# Patient Record
Sex: Female | Born: 1935
Health system: Southern US, Community
[De-identification: ages and names within clinical notes are randomized; demographics above are authoritative.]

## PROBLEM LIST (undated history)

## (undated) DIAGNOSIS — I259 Chronic ischemic heart disease, unspecified: Secondary | ICD-10-CM

## (undated) DIAGNOSIS — E785 Hyperlipidemia, unspecified: Secondary | ICD-10-CM

## (undated) DIAGNOSIS — I359 Nonrheumatic aortic valve disorder, unspecified: Secondary | ICD-10-CM

## (undated) DIAGNOSIS — F329 Major depressive disorder, single episode, unspecified: Secondary | ICD-10-CM

## (undated) DIAGNOSIS — M545 Low back pain, unspecified: Secondary | ICD-10-CM

## (undated) DIAGNOSIS — F32A Depression, unspecified: Secondary | ICD-10-CM

## (undated) DIAGNOSIS — J45909 Unspecified asthma, uncomplicated: Secondary | ICD-10-CM

## (undated) DIAGNOSIS — R011 Cardiac murmur, unspecified: Secondary | ICD-10-CM

## (undated) DIAGNOSIS — E079 Disorder of thyroid, unspecified: Secondary | ICD-10-CM

## (undated) DIAGNOSIS — E039 Hypothyroidism, unspecified: Secondary | ICD-10-CM

## (undated) DIAGNOSIS — R0602 Shortness of breath: Secondary | ICD-10-CM

## (undated) DIAGNOSIS — I10 Essential (primary) hypertension: Secondary | ICD-10-CM

## (undated) DIAGNOSIS — M1990 Unspecified osteoarthritis, unspecified site: Secondary | ICD-10-CM

## (undated) HISTORY — PX: BREAST SURGERY: SHX581

## (undated) HISTORY — DX: Chronic ischemic heart disease, unspecified: I25.9

## (undated) HISTORY — DX: Low back pain: M54.5

## (undated) HISTORY — DX: Nonrheumatic aortic valve disorder, unspecified: I35.9

## (undated) HISTORY — DX: Low back pain, unspecified: M54.50

## (undated) HISTORY — DX: Hyperlipidemia, unspecified: E78.5

## (undated) HISTORY — DX: Disorder of thyroid, unspecified: E07.9

## (undated) HISTORY — DX: Essential (primary) hypertension: I10

## (undated) HISTORY — PX: TUBAL LIGATION: SHX77

---

## 1997-08-10 ENCOUNTER — Ambulatory Visit (HOSPITAL_COMMUNITY): Admission: RE | Admit: 1997-08-10 | Discharge: 1997-08-10 | Payer: Self-pay | Admitting: Internal Medicine

## 1997-08-13 ENCOUNTER — Ambulatory Visit: Admission: RE | Admit: 1997-08-13 | Discharge: 1997-08-13 | Payer: Self-pay | Admitting: Internal Medicine

## 1997-11-16 ENCOUNTER — Ambulatory Visit (HOSPITAL_COMMUNITY): Admission: RE | Admit: 1997-11-16 | Discharge: 1997-11-16 | Payer: Self-pay | Admitting: General Surgery

## 1998-02-26 ENCOUNTER — Encounter: Payer: Self-pay | Admitting: Internal Medicine

## 1998-02-26 ENCOUNTER — Ambulatory Visit (HOSPITAL_COMMUNITY): Admission: RE | Admit: 1998-02-26 | Discharge: 1998-02-26 | Payer: Self-pay | Admitting: Internal Medicine

## 1998-05-13 ENCOUNTER — Ambulatory Visit (HOSPITAL_COMMUNITY): Admission: RE | Admit: 1998-05-13 | Discharge: 1998-05-13 | Payer: Self-pay | Admitting: General Surgery

## 1998-11-09 ENCOUNTER — Ambulatory Visit (HOSPITAL_COMMUNITY): Admission: RE | Admit: 1998-11-09 | Discharge: 1998-11-09 | Payer: Self-pay | Admitting: General Surgery

## 1998-11-09 ENCOUNTER — Encounter (HOSPITAL_BASED_OUTPATIENT_CLINIC_OR_DEPARTMENT_OTHER): Payer: Self-pay | Admitting: General Surgery

## 1998-11-10 ENCOUNTER — Encounter (INDEPENDENT_AMBULATORY_CARE_PROVIDER_SITE_OTHER): Payer: Self-pay

## 1998-11-10 ENCOUNTER — Ambulatory Visit (HOSPITAL_COMMUNITY): Admission: RE | Admit: 1998-11-10 | Discharge: 1998-11-10 | Payer: Self-pay | Admitting: General Surgery

## 1998-11-10 ENCOUNTER — Encounter (HOSPITAL_BASED_OUTPATIENT_CLINIC_OR_DEPARTMENT_OTHER): Payer: Self-pay | Admitting: General Surgery

## 1999-02-14 ENCOUNTER — Encounter: Admission: RE | Admit: 1999-02-14 | Discharge: 1999-02-14 | Payer: Self-pay | Admitting: Internal Medicine

## 1999-03-26 ENCOUNTER — Emergency Department (HOSPITAL_COMMUNITY): Admission: EM | Admit: 1999-03-26 | Discharge: 1999-03-26 | Payer: Self-pay | Admitting: Emergency Medicine

## 1999-03-26 ENCOUNTER — Encounter: Payer: Self-pay | Admitting: Emergency Medicine

## 1999-05-30 ENCOUNTER — Encounter (HOSPITAL_BASED_OUTPATIENT_CLINIC_OR_DEPARTMENT_OTHER): Payer: Self-pay | Admitting: General Surgery

## 1999-05-30 ENCOUNTER — Ambulatory Visit (HOSPITAL_COMMUNITY): Admission: RE | Admit: 1999-05-30 | Discharge: 1999-05-30 | Payer: Self-pay | Admitting: General Surgery

## 1999-11-15 ENCOUNTER — Ambulatory Visit (HOSPITAL_COMMUNITY): Admission: RE | Admit: 1999-11-15 | Discharge: 1999-11-15 | Payer: Self-pay | Admitting: General Surgery

## 1999-11-15 ENCOUNTER — Encounter (HOSPITAL_BASED_OUTPATIENT_CLINIC_OR_DEPARTMENT_OTHER): Payer: Self-pay | Admitting: General Surgery

## 2000-01-19 ENCOUNTER — Ambulatory Visit (HOSPITAL_COMMUNITY): Admission: RE | Admit: 2000-01-19 | Discharge: 2000-01-19 | Payer: Self-pay | Admitting: Gastroenterology

## 2000-11-21 ENCOUNTER — Emergency Department (HOSPITAL_COMMUNITY): Admission: EM | Admit: 2000-11-21 | Discharge: 2000-11-21 | Payer: Self-pay | Admitting: *Deleted

## 2000-11-21 ENCOUNTER — Encounter: Payer: Self-pay | Admitting: Emergency Medicine

## 2000-12-06 ENCOUNTER — Ambulatory Visit (HOSPITAL_COMMUNITY): Admission: RE | Admit: 2000-12-06 | Discharge: 2000-12-06 | Payer: Self-pay | Admitting: General Surgery

## 2000-12-06 ENCOUNTER — Encounter (HOSPITAL_BASED_OUTPATIENT_CLINIC_OR_DEPARTMENT_OTHER): Payer: Self-pay | Admitting: General Surgery

## 2001-06-02 ENCOUNTER — Encounter: Payer: Self-pay | Admitting: *Deleted

## 2001-06-02 ENCOUNTER — Emergency Department (HOSPITAL_COMMUNITY): Admission: EM | Admit: 2001-06-02 | Discharge: 2001-06-02 | Payer: Self-pay | Admitting: Emergency Medicine

## 2001-12-13 ENCOUNTER — Ambulatory Visit (HOSPITAL_COMMUNITY): Admission: RE | Admit: 2001-12-13 | Discharge: 2001-12-13 | Payer: Self-pay | Admitting: General Surgery

## 2001-12-13 ENCOUNTER — Encounter (HOSPITAL_BASED_OUTPATIENT_CLINIC_OR_DEPARTMENT_OTHER): Payer: Self-pay | Admitting: General Surgery

## 2001-12-20 ENCOUNTER — Encounter: Admission: RE | Admit: 2001-12-20 | Discharge: 2001-12-20 | Payer: Self-pay | Admitting: General Surgery

## 2001-12-20 ENCOUNTER — Encounter (HOSPITAL_BASED_OUTPATIENT_CLINIC_OR_DEPARTMENT_OTHER): Payer: Self-pay | Admitting: General Surgery

## 2002-07-11 ENCOUNTER — Inpatient Hospital Stay (HOSPITAL_COMMUNITY): Admission: RE | Admit: 2002-07-11 | Discharge: 2002-07-16 | Payer: Self-pay | Admitting: Internal Medicine

## 2002-07-11 ENCOUNTER — Encounter: Payer: Self-pay | Admitting: Internal Medicine

## 2002-07-16 ENCOUNTER — Encounter (INDEPENDENT_AMBULATORY_CARE_PROVIDER_SITE_OTHER): Payer: Self-pay | Admitting: Cardiology

## 2003-01-22 ENCOUNTER — Encounter: Admission: RE | Admit: 2003-01-22 | Discharge: 2003-01-22 | Payer: Self-pay | Admitting: Internal Medicine

## 2003-01-22 ENCOUNTER — Encounter: Payer: Self-pay | Admitting: Internal Medicine

## 2003-02-03 ENCOUNTER — Encounter: Admission: RE | Admit: 2003-02-03 | Discharge: 2003-04-01 | Payer: Self-pay | Admitting: Internal Medicine

## 2003-06-04 ENCOUNTER — Encounter: Admission: RE | Admit: 2003-06-04 | Discharge: 2003-09-02 | Payer: Self-pay | Admitting: Internal Medicine

## 2003-09-02 ENCOUNTER — Emergency Department (HOSPITAL_COMMUNITY): Admission: EM | Admit: 2003-09-02 | Discharge: 2003-09-02 | Payer: Self-pay | Admitting: Family Medicine

## 2003-09-18 ENCOUNTER — Inpatient Hospital Stay (HOSPITAL_COMMUNITY): Admission: AD | Admit: 2003-09-18 | Discharge: 2003-09-23 | Payer: Self-pay | Admitting: Internal Medicine

## 2003-11-10 ENCOUNTER — Ambulatory Visit (HOSPITAL_COMMUNITY): Admission: RE | Admit: 2003-11-10 | Discharge: 2003-11-10 | Payer: Self-pay | Admitting: Internal Medicine

## 2003-11-10 ENCOUNTER — Encounter (INDEPENDENT_AMBULATORY_CARE_PROVIDER_SITE_OTHER): Payer: Self-pay | Admitting: Cardiovascular Disease

## 2004-01-27 ENCOUNTER — Encounter: Admission: RE | Admit: 2004-01-27 | Discharge: 2004-01-27 | Payer: Self-pay | Admitting: General Surgery

## 2004-03-08 ENCOUNTER — Ambulatory Visit (HOSPITAL_COMMUNITY): Admission: RE | Admit: 2004-03-08 | Discharge: 2004-03-08 | Payer: Self-pay | Admitting: Internal Medicine

## 2004-07-08 ENCOUNTER — Inpatient Hospital Stay (HOSPITAL_COMMUNITY): Admission: EM | Admit: 2004-07-08 | Discharge: 2004-07-12 | Payer: Self-pay | Admitting: Family Medicine

## 2004-07-18 ENCOUNTER — Emergency Department (HOSPITAL_COMMUNITY): Admission: EM | Admit: 2004-07-18 | Discharge: 2004-07-18 | Payer: Self-pay | Admitting: Family Medicine

## 2005-02-06 ENCOUNTER — Encounter: Admission: RE | Admit: 2005-02-06 | Discharge: 2005-02-06 | Payer: Self-pay | Admitting: Internal Medicine

## 2005-10-25 ENCOUNTER — Emergency Department (HOSPITAL_COMMUNITY): Admission: EM | Admit: 2005-10-25 | Discharge: 2005-10-25 | Payer: Self-pay | Admitting: Family Medicine

## 2006-02-14 ENCOUNTER — Encounter: Admission: RE | Admit: 2006-02-14 | Discharge: 2006-02-14 | Payer: Self-pay

## 2007-01-16 ENCOUNTER — Emergency Department (HOSPITAL_COMMUNITY): Admission: EM | Admit: 2007-01-16 | Discharge: 2007-01-16 | Payer: Self-pay | Admitting: Emergency Medicine

## 2007-03-15 ENCOUNTER — Encounter: Admission: RE | Admit: 2007-03-15 | Discharge: 2007-03-15 | Payer: Self-pay | Admitting: Internal Medicine

## 2007-08-26 ENCOUNTER — Emergency Department (HOSPITAL_COMMUNITY): Admission: EM | Admit: 2007-08-26 | Discharge: 2007-08-26 | Payer: Self-pay | Admitting: Emergency Medicine

## 2007-10-14 ENCOUNTER — Ambulatory Visit: Payer: Self-pay | Admitting: Vascular Surgery

## 2008-03-16 ENCOUNTER — Encounter: Admission: RE | Admit: 2008-03-16 | Discharge: 2008-03-16 | Payer: Self-pay | Admitting: Internal Medicine

## 2008-05-11 ENCOUNTER — Emergency Department (HOSPITAL_COMMUNITY): Admission: EM | Admit: 2008-05-11 | Discharge: 2008-05-11 | Payer: Self-pay | Admitting: Emergency Medicine

## 2008-05-24 ENCOUNTER — Emergency Department (HOSPITAL_COMMUNITY): Admission: EM | Admit: 2008-05-24 | Discharge: 2008-05-24 | Payer: Self-pay | Admitting: Family Medicine

## 2008-08-28 ENCOUNTER — Emergency Department (HOSPITAL_COMMUNITY): Admission: EM | Admit: 2008-08-28 | Discharge: 2008-08-28 | Payer: Self-pay | Admitting: Emergency Medicine

## 2009-03-17 ENCOUNTER — Encounter: Admission: RE | Admit: 2009-03-17 | Discharge: 2009-03-17 | Payer: Self-pay | Admitting: Internal Medicine

## 2009-09-19 ENCOUNTER — Emergency Department (HOSPITAL_COMMUNITY): Admission: EM | Admit: 2009-09-19 | Discharge: 2009-09-19 | Payer: Self-pay | Admitting: Family Medicine

## 2010-02-24 ENCOUNTER — Ambulatory Visit: Payer: Self-pay | Admitting: Cardiovascular Disease

## 2010-03-15 ENCOUNTER — Ambulatory Visit: Payer: Self-pay

## 2010-03-15 ENCOUNTER — Encounter: Payer: Self-pay | Admitting: Cardiovascular Disease

## 2010-03-15 ENCOUNTER — Ambulatory Visit (HOSPITAL_COMMUNITY): Admission: RE | Admit: 2010-03-15 | Discharge: 2010-03-15 | Payer: Self-pay | Admitting: Cardiovascular Disease

## 2010-03-15 ENCOUNTER — Ambulatory Visit: Payer: Self-pay | Admitting: Cardiovascular Disease

## 2010-03-30 ENCOUNTER — Encounter: Admission: RE | Admit: 2010-03-30 | Discharge: 2010-03-30 | Payer: Self-pay | Admitting: Internal Medicine

## 2010-07-19 ENCOUNTER — Ambulatory Visit
Admission: RE | Admit: 2010-07-19 | Discharge: 2010-07-19 | Disposition: A | Discharge status: Home / Self Care | Payer: PRIVATE HEALTH INSURANCE | Source: Ambulatory Visit | Attending: Internal Medicine | Admitting: Internal Medicine

## 2010-07-19 ENCOUNTER — Ambulatory Visit
Admission: RE | Admit: 2010-07-19 | Discharge: 2010-07-19 | Disposition: A | Discharge status: Home / Self Care | Payer: Medicare Other | Source: Ambulatory Visit | Attending: Internal Medicine | Admitting: Internal Medicine

## 2010-07-19 ENCOUNTER — Other Ambulatory Visit: Payer: Self-pay | Admitting: Internal Medicine

## 2010-07-19 DIAGNOSIS — M545 Low back pain: Secondary | ICD-10-CM

## 2010-07-19 DIAGNOSIS — M25559 Pain in unspecified hip: Secondary | ICD-10-CM

## 2010-08-10 LAB — GLUCOSE, CAPILLARY: Glucose-Capillary: 129 mg/dL — ABNORMAL HIGH (ref 70–99)

## 2010-08-11 ENCOUNTER — Encounter: Payer: Self-pay | Admitting: Internal Medicine

## 2010-09-13 NOTE — Procedures (Signed)
CAROTID DUPLEX EXAM   INDICATION:  Left carotid bruit.   HISTORY:  Diabetes:  Insulin-dependent.  Cardiac:  History of cardiac murmur.  Hypertension:  Yes.  Smoking:  No.  Previous Surgery:  No.  CV History:  Previous duplex of the carotids performed on 03/16/06  revealed 20-39% ICA stenosis bilaterally.  Patient reports frequent  dizzy spells.  Amaurosis Fugax No, Paresthesias No, Hemiparesis No                                       RIGHT             LEFT  Brachial systolic pressure:         168               168  Brachial Doppler waveforms:         Triphasic         Triphasic  Vertebral direction of flow:        Antegrade         Antegrade  DUPLEX VELOCITIES (cm/sec)  CCA peak systolic                   76                91  ECA peak systolic                   48                79  ICA peak systolic                   64                77  ICA end diastolic                   17                15  PLAQUE MORPHOLOGY:                  Soft              Mixed  PLAQUE AMOUNT:                      Mild              Mild  PLAQUE LOCATION:                    Proximal ICA      Proximal ICA   IMPRESSION:  1. 20-39% internal carotid artery stenosis bilaterally.  2. No significant change from previous study performed on 03/16/06.   ___________________________________________  Janetta Hora. Fields, MD   MC/MEDQ  D:  10/14/2007  T:  10/14/2007  Job:  [id number]

## 2010-09-16 NOTE — Discharge Summary (Signed)
NAME:  [name], [name] NO.:  [id number]   MEDICAL RECORD NO.:  [id number]                   PATIENT TYPE:  INP   LOCATION:  5157                                 FACILITY:  [facility]   PHYSICIAN:  Eric L. August Saucer, M.D.                  DATE OF BIRTH:  1932/07/27   DATE OF ADMISSION:  07/11/2002  DATE OF DISCHARGE:  07/16/2002                                 DISCHARGE SUMMARY   FINAL DIAGNOSES:  1. Unsteady gait with refractory vertigo, improved.  2. Benign positional vertigo.  3. Otitis media.  4. Diabetes mellitus, uncontrolled.  5. Depression with unresolved grief.  6. Hypertension.   OPERATION/PROCEDURE:  None.   HISTORY AND PHYSICAL:  The patient is a 77 year old widowed black female who  presents complaining of increasing dizziness with unsteady gait with  increasing left sided headaches of three days duration. She has a history of  allergic rhinitis, middle ear trauma in the past. She reports present  symptoms as being much different from her previous problems. They have not  been controlled with oral medications. There has been no documented fever  although she has had a flushed feeling for the past three days. She has  noted occasional cough. No wheeze. She felt dizziness with turning over in  bed, proceeded by sharp left sided headaches. The patient became increasing  concerned. There was no response to oral medications, and she was  subsequently admitted.   Her history is positive for hypertension, diabetes mellitus, obesity,  anxiety disorder. Recent depression with unresolved grief. Recent allergy  problems as well.   Past medical history and physical exam is per admission H&P.   HOSPITAL COURSE:  The patient was admitted for further treatment of severe  refractory vertigo with chronic Eustachian tube dysfunction. There was a  question by exam of underlying otitis media as well and associated  sinusitis. The patient was admitted and  placed at bedrest with bathroom  privileges with assistance. She was started on IV fluids for rehydration as  well. She had previously been on meclizine and was started on Valium at 2 mg  p.o. q.6h. She was placed on Avelox at 400 mg IV q.d. as well as  continuation of her Allegra. The patient also was noted to have mild  uncontrolled diabetes at the time and was placed on a sliding scale as well.  She was restarted on a diabetic medication, also medication for thyroid. It  was noted that she had significant problems with depression as well.  Consultation was obtained with Dr. Dub Mikes. She underwent further adjustments  of her psychotropic medicines for unresolved grief with major depression.   A CT scan of her head was done which showed no evidence of cranial lesions.  With intensive therapy, the patient did make steady improvement with gradual  resolution of her vertigo. Her gait improved thereafter as well. With  adjustment of her diabetes medications, her blood sugars improved. Ongoing  therapy with Dr. Dub Mikes was pursued as well. The patient did undergo an EEG  which showed no evidence for seizure disorder. It was felt that further  evaluation of vertigo and unsteady gait could be pursued as an outpatient.  After several days of therapy, the patient did progress to the point of  being stable for discharge. It was felt that she did have a component of  benign positional vertigo as well.   Notably with the change of her psychotropic medications, her depression did  improve as well, but further treatment will be needed as an outpatient.   By 07/16/02, she was feeling much better. She was felt to be stable for  discharge.   DISCHARGE MEDICATIONS:  Medications at the time of discharge consisted of:  1. Allegra 180 mg p.o. q.d.  2. Synthroid 100 mcg q.d.  3. Avandamet 2/1,000 mg b.i.d.  4. Lotrel 5/20 one q.d.  5. Astelin nasal spray two puffs b.i.d.  6. Antivert 25 mg t.i.d.  7. Effexor  XR 37.5 mg q.d.  8. Avelox 400 mg q.d. for seven days.  9. Lantus 12 units subcu q.h.s.   DIET:  She will be maintained on a 4-g sodium 1,500 calorie carbohydrate  modified diet on a sliding scale, with CBGs greater than or up to 350 15  units, 280 to 349 12 units, 210 to 279 9 units, 140 to 209 6 units.   FOLLOW UP:  She will call the office for followup appointment in two weeks  time.                                               Eric L. August Saucer, M.D.    ELD/MEDQ  D:  08/21/2002  T:  08/21/2002  Job:  [id number]

## 2010-09-16 NOTE — Discharge Summary (Signed)
NAME:  [name], [name]                          ACCOUNT NO.:  [id number]   MEDICAL RECORD NO.:  [id number]                   PATIENT TYPE:  INP   LOCATION:  5504                                 FACILITY:  [facility]   PHYSICIAN:  Eric L. August Saucer, M.D.                  DATE OF BIRTH:  August 28, 1932   DATE OF ADMISSION:  09/18/2003  DATE OF DISCHARGE:  09/23/2003                                 DISCHARGE SUMMARY   FINAL DIAGNOSES:  1. Diabetes mellitus, type 2, non-insulin-dependent, 250.82.  2. Obesity, 278.00.  3. Anemia, 285.9.  4. Panic disorder, 300.01.  5. Hypothyroidism, 244.9.  6. Hypertension, 401.9.   OPERATIONS/PROCEDURES:  None.   HISTORY OF PRESENT ILLNESS:  This was one of several [facility]  admissions for this 77 year old, widowed, black female with a longstanding  history of hypertension and diabetes.  The patient had been doing fairly  well until approximately 3 weeks ago.  During that time, she had begun  having episodes of low blood sugars followed by high blood sugars.  The  patient notably during that time had started changing her eating habits as  well as exercising.  Her medications, including Lantus insulin, were stopped  initially at that time.  She apparently had continued to have bouts of blood  sugars as low as 40s.  On further questioning, she apparently had been  taking extra insulin after drinking sodas for low blood sugars.  She noted  that her sugars would dip after rebound into the 200s and 300 range.  She  thereafter would take more insulin to bring her sugars down.  She was  checking her blood sugars as much as 6 times within a several hour period to  document her blood sugars.  The patient notably after hypoglycemic spells  would be mildly confused.  She had developed increasing anxiety around this  as well.  The patient essentially lives alone.  After calling our office in  panic, the patient was subsequently admitted for further evaluation and  stabilization of her blood sugars.   PAST MEDICAL HISTORY/PHYSICAL EXAMINATION:  Per admission H&P.   HOSPITAL COURSE:  The patient was admitted for further evaluation of her  uncontrolled diabetes mellitus.  By history, she was having marked,  fluctuating blood sugars.  There was concern that she was having a Somogyi  reaction.  This would result in low blood sugars during the night with  reactive hyperglycemia in the mornings.  The patient's medications were  adjusted.  Close followup of her blood sugars ensued thereafter, with a new  sliding scale being applied, though initially we were wanting to avoid any  further hypoglycemic spells.  She was also started on Actos with increasing  of her Metformin dosing.  Over the subsequent days, she had no further  hypoglycemic spells.  The patient became less confused.  She continued to  have some  anxiety which was adjusted with the medication.  On serial exams,  she was noted to have a mild otitis media.  This was treated with  antibiotics.  No other source of an infection was found.  The patient did  undergo further diabetic education.  Her regimen was eventually stabilized  at the time of discharge.   It was also noted that patient had significant anxiety disorder as noted.  She also had evidence for ongoing depression.  She was therefore placed on  Paxil for control of this as well.   By Sep 23, 2003, she was feeling considerably better.  It was felt that she  was stable for discharge, with close followup as an outpatient.  It was felt  that she would need further adjustments of her medication as her lifestyles  did change over the upcoming weeks.   MEDICATIONS AT TIME OF DISCHARGE:  1. Actos 30 mg p.o. q.a.m.  2. Glucophage-XR 1000 mg b.i.d.  3. Lotrel 10/20 1 q.a.m.  4. Synthroid 100 mcg q.a.m.  5. Cipro 500 mg b.i.d.  6. Paxil-CR 12.5 mg q.a.m.  7. Clarinex 5 mg daily.  8. Meclizine 12.5 mg t.i.d. as needed for dizziness.  9.  Centrum Silver 1 daily.  10.      Ativan 0.5 mg b.i.d. as needed for anxiety.   She will be maintained on a 4-gram sodium, 1600 calorie diet, ADA diet.  She  will be maintained on a sliding scale with Humalog for lunch and dinner only  CBGs 151-200 and she is to take 3 units, 201-250 6 units, 250-300 9 units,  301-350 12 units.  She will be seen in the office in one week's time for  followup.                                                Eric L. August Saucer, M.D.    ELD/MEDQ  D:  10/21/2003  T:  10/23/2003  Job:  [id number]

## 2010-09-16 NOTE — H&P (Signed)
[name], [name]                ACCOUNT NO.:  [id number]   MEDICAL RECORD NO.:  [id number]          PATIENT TYPE:  INP   LOCATION:  6529                         FACILITY:  [facility]   PHYSICIAN:  Eric L. August Saucer, M.D.     DATE OF BIRTH:  November 12, 1932   DATE OF ADMISSION:  07/08/2004  DATE OF DISCHARGE:                                HISTORY & PHYSICAL   CHIEF COMPLAINT:  Progressive weakness with hyponatremia.   HISTORY OF PRESENT ILLNESS:  This is the third [facility] admission  for this 77 year old widowed black female with longstanding history of  hypertension and diabetes mellitus who states, for the past three days, she  has had progressive weakness.  She had been doing fairly well up until  approximately two weeks ago.  She noted that her blood pressure medication  was changed triamterene/hydrochlorothiazide at a dose of 25 mg daily.  Over  the subsequent days, she noted intermittent leg cramps at night.  For the  past three days, she has had problems with lightheadedness.  This is  described as a lightheaded sensation mainly orthostatic in nature.  There  has been no associated vertigo.  Today she had transiently increasing  weakness.  She had a sensation of not feeling herself.  This was associated  with blurred vision and progressive weakness as well.  The patient  subsequently went to urgent care for evaluation. She is noted to have a  significant hyponatremia with sodium of 123.  She was subsequently referred  to the emergency room and is admitted at this time for further evaluation.   REVIEW OF SYSTEMS:  She has longstanding diabetes mellitus.  She was  recently started on Byetta per Dr. Lucianne Muss.  Two weeks ago, she also had the  addition of triamterene/hydrochlorothiazide at a dose of 25 mg daily.  The  patient has been taking her other medications as previously noted.  Her  eating habits have not changed.  Today she was noted to be significantly  hyponatremic with  subsequent admission.  Review of Systems is otherwise  notable for a three day history of intermittent chills without actual night  sweats.  She is unaware of having a fever.  She denies abdominal pain,  nausea and vomiting, or dysuria.  There has been no significant cough.  No  yellow or greenish rhinorrhea.   PAST MEDICAL HISTORY:  Otherwise notable for anxiety disorder, longstanding  in nature. She has had recurrent middle ear problems with vertigo and  tinnitus.  She had previously been followed by Dr. Dorma Russell as well.  She has  had bilateral ear surgery in the past.  States she was just recently seen by  Dr. Lenard Forth three days with the ears being normal.   HABITS:  The patient does not smoke or drink.   SOCIAL HISTORY:  The patient is widowed.  She lives with her sister.   PRESENT MEDICATIONS:  1.  Avandamet 06/998 mg b.i.d.  2.  Lotrel 10/20 one daily.  3.  Synthroid 88 mcg daily, which was decreased from 100 mcg.  4.  Clarinex 5 mg daily.  5.  Nasal spray b.i.d.  6.  Triamterene/hydrochlorothiazide 25 mg daily, last taken 2 days ago.  7.  Lantus 8 units subcutaneously q.a.m.  8.  Byetta pen 5 mcg b.i.d. which she stopped on July 07, 2004, after she      felt herself becoming nauseated.   PHYSICAL EXAMINATION:  GENERAL:  Presently weak-appearing black female in no  acute distress, though somewhat anxious.  VITAL SIGNS:  Height 5 feet 3 inches, weight 158 pounds.  Blood pressure  127/45, pulse 78, respiratory rate 23, temperature 100.4.  Her CBG at the  time of admission was 178.  HEENT:  Head normocephalic and atraumatic, without bruits.  Extraocular  muscles are intact without vertigo.  There is no sinus tenderness.  TMs  without erythematous changes.  Nose:  Mild turbinate edema without  occlusion.  Membranes are dry.  Posterior pharynx is clear.  NECK:  Supple, no enlarged thyroid.  No pulses or nodes.  LUNGS:  Clear to auscultation and percussion without CVA tenderness.  No  E  to A changes.  CARDIOVASCULAR:  A 1/6 systolic ejection murmur heard loudest in left  intercostal space.  Murmur appreciated, no rub appreciated.  ABDOMEN:  Bowel sounds are present.  No enlarged liver or spleen, masses, or  tenderness.  EXTREMITIES:  Negative Homan's.  No edema.  Pulses are intact.  NEUROLOGIC:  She is alert and oriented to person, place, and time.  Negative  drift.  Strength intact grossly.   LABORATORY DATA AND OTHER STUDIES:  Hemoglobin 12.2, hematocrit 36.  Sodium  123, potassium 4.5, chloride 92, BUN 21, glucose 112.  Urinalysis:  Nitrates  and leukocytes negative, protein negative as well.  Other studies pending at  this time.   IMPRESSION:  1.  Hyponatremia, symptomatic.  Rule out secondary to excessive diuretic      effect versus syndrome (of) inappropriate (secretion of) antidiuretic      hormone versus other.  2.  Diabetes mellitus.  3.  Fever of questionable etiology, rule out occult infection versus viral      syndrome.  Doubt medication reaction at this time.  4.  History of hypertension, currently stable.  5.  Anxiety disorder.  6.  Hypothyroidism, recent medication changes noted.   PLAN:  1.  The patient is admitted at this time for further evaluation of her      hyponatremia and correction with subsequent followup of symptoms.  2.  Will obtain a urine sodium and osmolality.  3.  Her diuretic agent has been held at this time.  4.  She will be placed on normal saline followed by half normal saline with      close followup of her blood pressure and cardiovascular status.  5.  Blood and urine cultures will obtained because of fever.  6.  Other lab studies pending at this time.  7.  Empiric antibiotics pending results of the above.  8.  Resume her Lotrel with close followup of blood pressure thereafter.  9.  Further therapy thereafter.    ELD/MEDQ  D:  07/08/2004  T:  07/08/2004  Job:  [id number]

## 2010-09-16 NOTE — H&P (Signed)
NAME:  [name], [name]                          ACCOUNT NO.:  [id number]   MEDICAL RECORD NO.:  [id number]                   PATIENT TYPE:  INP   LOCATION:  5504                                 FACILITY:  [facility]   PHYSICIAN:  Eric L. August Saucer, M.D.                  DATE OF BIRTH:  Oct 29, 1932   DATE OF ADMISSION:  09/18/2003  DATE OF DISCHARGE:                                HISTORY & PHYSICAL   CHIEF COMPLAINT:  Fluctuating blood sugars with hypoglycemia, increasing  anxiety.   HISTORY OF PRESENT ILLNESS:  This is one of several [facility]  admissions for this 77 year old, widowed, black female with a longstanding  history of hypertension and diabetes.  The patient had been doing fairly  well up until approximately three weeks ago.  During that time, she began  having episodes of low blood sugars, followed by high blood sugars.  The  patient notably, during that time, had started changing her eating habits as  well as exercising.  Her medications, including Lantus insulin, were stopped  initially at that time.  She apparently had continued to have bouts of blood  sugars as low as 40s.  On further questioning, she apparently has been  taking extra insulin, after drinking sodas for low blood sugars.  She noted  her sugars would dip after rebound into the 200s to 300 range.  She,  thereafter, would take more insulin to bring her sugars down.  She states  she was checking her blood sugars as much as six times within a several hour  period to document her blood sugars.  The patient notably after her  hypoglycemic spells would be mildly confused.  She had increasing anxiety  with this.  The patient essentially lives alone.  She called the office  today panicking.  She was subsequently admitted for further evaluation and  stabilization.  History is significant for her having had previously stable  blood sugars, up until one month ago.  The patient had started complying  with her diet  more without acknowledging that she had not been doing so  prior to this.   PAST MEDICAL HISTORY:  1. Hypertension.  2. She has a history of a longstanding anxiety disorder.  Recently has had     more panic attacks as noted above.  3. The patient has also had recurrent middle ear problems with vertigo and     tinnitus.  She has had two surgeries in the past, in 2001 and 2004, for     recurrent peri-lip edema involving the ear.  4. History of depression which has been exacerbated by the death of her     husband, approximately two years ago.  Past medical history otherwise unremarkable.   PRESENT MEDICATIONS:  1. Clarinex 5 mg every day.  2. Lotrel 10/20 one every day.  3. Metformin presently at 1,000 mg at night and  500 mg in the a.m.  4. Avandia 2 mg in the a.m.  5. Multivitamin every day.  6. Insulin on a sliding scale basis with Humulin R for a.c. only.  She had     previously been on Lantus insulin which she had been advised to stop.  It     is unclear if the patient had been still using Lantus insulin at the time     of admission.   SOCIAL HISTORY:  The patient is widowed as noted.  She has one sister who  lives in the same home but is not there during the night due to her work  schedule.  The patient has children who live outside of [place].   REVIEW OF SYSTEMS:  As noted above.  She does acknowledge having periods of  low blood sugars at nighttime, after taking insulin in the evenings.  She  will, thereafter, in the morning time have high blood sugars.  This  apparently has also occurred without her drinking several sodas, peanut  butter and jelly.   PHYSICAL EXAMINATION:  GENERAL:  She is a well developed, anxious, black  female in mild distress.  VITAL SIGNS:  Height 5 feet 4 inches, weight 159 pounds, blood pressure  124/71, pulse 79, respiratory rate 20, temperature 97.7.  CBG, at the time  of admission, of 279.  HEENT:  Head normocephalic, atraumatic without  bruits.  Extraocular muscles  are intact.  She has mild right lateral nystagmus.  There is on sinus  tenderness.  The posterior pharynx is clear.  NECK:  Supple.  No enlarged thyroid.  No carotid bruits appreciated.  LUNGS:  Clear to auscultation percussion without CVA tenderness.  No E:A  changes.  CARDIOVASCULAR:  Normal S1 and S2.  No S3.  She has a soft 1/6 systolic  ejection murmur in the lower left sternal border.  No rub appreciated.  No  ectopy noted.  ABDOMEN:  Minimal epigastric tenderness.  No masses or fullness otherwise  appreciated.  EXTREMITIES:  Full range of motion upper extremities.  She has mild crepitus  in knees, passive range of motion without tenderness.  Negative Homan.  No  edema.  She has 2+ dorsalis pedis pulses in the lower extremities.  NEUROLOGIC:  Nonfocal.  Strength is intact.  PSYCHIATRIC:  The patient is anxious with associated depression.  Fearful of  being at home by herself with her hypoglycemic spells.  Acknowledges  recurrent panic attacks.   LABORATORY DATA:  CBC reveals WBC of 4,200, hemoglobin of 10.6, hematocrit  of 30.9, MCV of 90.5.  Chemistry:  Sodium 136, potassium 3.5, chloride 102,  CO2 27, BUN of 9, creatinine 0.8, glucose of 274.  Total protein of 7.1,  albumin of 3.9.  SGOT and SGPT were within normal limits.  Calcium 9.3.  Other laboratory data pending at this time.   IMPRESSION:  1. Uncontrolled diabetes mellitus.  2. Nocturnal hypoglycemia rule out __________.  Responses with subsequent     early morning hyperglycemia.  3. Anxiety disorder with recurrent panic attacks.  4. Rule out hyperglycemic encephalopathy.  5. History of hypertension, currently stable.  6. Obesity.  The patient most recently has tried to comply with her diet     more as well as exercise.  7. History of recurrent vertigo and middle ear infections.  8. Anemia, questionable etiology rule out loss versus and/or decreased    production.   PLAN:  The patient  is admitted for further stabilization of  her diet and  blood sugars.  We will proceed with diabetic education again.  We will place  her on a sliding scale insulin.  We will hold all of her diabetic  medications otherwise, except for low dose Metformin at 500 mg in the  evening.  Monitor for hypoglycemic spells.  We will obtain clean catch urine  for UA and C&S to rule out occult infection.  We will start her on Paxil CR  for associated anxiety with depression.  We will guaiac stools and obtain  anemia indices due to recent anemia.  Followup thereafter.                                                Eric L. August Saucer, M.D.    ELD/MEDQ  D:  09/18/2003  T:  09/20/2003  Job:  [id number]

## 2010-09-16 NOTE — Op Note (Signed)
[place]. [facility]  Patient:    [name], [name]                       MRN: [id number] Proc. Date: 01/19/00 Adm. Date:  [id number] Attending:  Orland Mustard Dictator:   Llana Aliment Randa Evens, M.D. CC:         Lind Guest. August Saucer, M.D.   Operative Report  PROCEDURE:  Colonoscopy.  GASTROENTEROLOGIST:  Llana Aliment. Edwards, M.D.  MEDICATIONS:  Fentanyl 100 mcg, Versed 10 mg IV.  INDICATIONS:  Strong family history of colon cancer.  INSTRUMENT:  Pediatric video colonoscopy.  DESCRIPTION OF PROCEDURE:  The procedure had been explained to the pat including potential risks and benefits and consent obtained.  With the patient in the left lateral decubitus position, the pediatric video colonoscope was inserted following digital rectal exam and advanced under direct visualization.  The prep was excellent and advanced around to the cecum eventually.  She had a very long, tortuous colon.  Multiple maneuvers and abdominal pressure were applied.  The ileocecal valve was seen and appendiceal orifice was reached.  The scope with withdrawn, and the cecum, ascending colon, hepatic flexure, transverse colon, splenic flexure, descending and sigmoid were seen well upon removal.  No polyps seen.  No significant diverticular disease.  The scope was withdrawn.  The patient tolerated the procedure well.  He was maintained on low-flow oxygen on pulse oximetry throughout the procedure with no obvious problem.  ASSESSMENT:  Normal colonoscopy with no evidence of polyps in a woman with a strong family history of colon cancer.  PLAN:  Recommend repeating within five years with the adult scope with yearly hemoccults.  Do the colonoscopy sooner of her stools are positive. DD:  01/19/00 TD:  01/20/00 Job: [id number]

## 2010-09-16 NOTE — Discharge Summary (Signed)
[name], [name]                ACCOUNT NO.:  [id number]   MEDICAL RECORD NO.:  [id number]          PATIENT TYPE:  INP   LOCATION:  4715                         FACILITY:  [facility]   PHYSICIAN:  Eric L. August Saucer, M.D.     DATE OF BIRTH:  1932/08/29   DATE OF ADMISSION:  07/08/2004  DATE OF DISCHARGE:  07/12/2004                                 DISCHARGE SUMMARY   FINAL DIAGNOSES:  1.  Neural hypothesis disorder.  253.6  2.  Hypertension.  401.9  3.  Diabetes mellitus type 2, non insulin dependent.  250.00  4.  Anxiety disorder.  300.00  5.  Tinnitus.  388.30.   PROCEDURE:  None.   HISTORY OF PRESENT ILLNESS:  This was the third [facility]  admission for this 77 year old widowed black female with longstanding  history of hypertension and diabetes mellitus, who for the past 3 days had  been having progressive weakness prior to admission.  The patient had been  doing well until approximately 2 weeks prior to admission.  She noted her  blood pressure medication was changed to triamterene hydrochlorothiazide at  a dose of 25 mg daily.  Over the subsequent days, she noted intermittent leg  cramps.  For the 3 days prior to admission, she had problems with  lightheadedness as well.  This was mostly orthostatic in nature, although  she had not experienced significant vertigo.  On the day of admission she  experienced decreasing weakness with the sensation of not feeling herself.  This was associated with blurred vision and progressive weakness.  The  patient subsequently went to Urgent Care for evaluation.  She was noted to  have a serum sodium of 123.  She was subsequently referred to the emergency  room and was admitted at this time for further evaluation.   PAST MEDICAL HISTORY:   PHYSICAL EXAMINATION:  Per admission H&P.  She notably had been recently  started on Byetta per Reather Littler, M.D. of endocrinology.  She had a  medication change with hydrochlorothiazide noted as well.   She had continued  taking her other medications as previously noted.  Otherwise noted per  admission H&P.   HOSPITAL COURSE:  The patient was admitted for further evaluation of  progressive weakness and lightheadedness.  She was noted at the time of  admission to have a serum sodium of 123 with potassium 4.5 and normal renal  functions.  There was no evidence for anemia as well.  Serum osmolality as  well as urine osmolality was obtained.  The patient was started on IV normal  saline for correction of her hyponatremia as well as volume replacement as  well.  She had been on a diuretic agent which was held as well.  Blood and  urine cultures were obtained as well to exclude occult infection.  Over the  subsequent 24 hours, the patient did make some improvement.  She noted that  her weakness, notably orthostasis did improve as well.  Her urine sodium  versus her serum sodium with associated osmolality was not consistent with  SIADH.  The concern for diuretic effect was then obtained as well based on  the patient's history.  She was continued on normal saline with subsequent  correction of electrolytes to a sodium of 136 with normal renal functions  persisting.  Her IV fluids were subsequently decreased to [facility].  Her activity  was increased.  During this time, she had further adjustments of her  diabetic medication as well.  Blood sugars fluctuated as her medication was  gradually reintroduced.  Her Avandamet was initially held due to a  questionable decreased renal functions which was subsequently started.   Notably after initial stopping her IV fluids, she did have a mild drop of  her serum sodium.  She was thereafter placed on 1000 mL fluid restriction.  With this, her serum sodium gradually increase to 136.  No other endocrine  abnormality was found.  Medication was subsequently adjusted.   The patient's only other problem was that of sinusitis which was noted at  the time of  admission.  There was no definite otitis media, though, she had  recurrent problems with vertigo.  This was treated with Rocephin 1 gram IV  while in the hospital with good resolution of her symptoms.   With the adjustment of her blood pressure medication as well as her diabetic  medication, the patient did become more stabilized.  Her blood pressure was  stable and she became more ambulatory.  Her serum sodium remained stable.  She was subsequently discharged home much improved on the following regimen.   DISCHARGE MEDICATIONS:  1.  Lantus Insulin 8 units subcu q.a.m.  2.  Byetta 5 mcg subcutaneous b.i.d.  3.  Claritin 10 mg daily.  4.  Avandamet 06/998 mg b.i.d.  5.  Synthroid 88 mcg daily.  6.  Vantin 200 mg daily for 7 days.  7.  Lotrel 5/10 one daily.   The patient will be maintained on a 1500 calorie ADA, no-added-salt diet.  She will be maintained on 1000 mL fluid restriction diet.  She will need a  chemistry panel in one weeks' time, office visit in two weeks' time.      ELD/MEDQ  D:  09/07/2004  T:  09/08/2004  Job:  [id number]

## 2010-12-28 ENCOUNTER — Other Ambulatory Visit: Payer: Self-pay | Admitting: *Deleted

## 2010-12-28 MED ORDER — HYDROCHLOROTHIAZIDE 12.5 MG PO TABS: ORAL_TABLET | ORAL | Status: DC

## 2010-12-28 NOTE — Telephone Encounter (Signed)
Fax received from pharmacy. Refill completed. [name] Jasdeep Dejarnett RN  

## 2011-01-09 ENCOUNTER — Ambulatory Visit
Admission: RE | Admit: 2011-01-09 | Discharge: 2011-01-09 | Disposition: A | Discharge status: Home / Self Care | Payer: PRIVATE HEALTH INSURANCE | Source: Ambulatory Visit | Attending: Internal Medicine | Admitting: Internal Medicine

## 2011-01-09 ENCOUNTER — Other Ambulatory Visit: Payer: Self-pay | Admitting: Internal Medicine

## 2011-01-09 DIAGNOSIS — M79605 Pain in left leg: Secondary | ICD-10-CM

## 2011-01-24 LAB — URINE CULTURE

## 2011-01-24 LAB — URINALYSIS, ROUTINE W REFLEX MICROSCOPIC
Nitrite: NEGATIVE
Protein, ur: NEGATIVE
Specific Gravity, Urine: 1.012
Urobilinogen, UA: 1

## 2011-01-24 LAB — URINE MICROSCOPIC-ADD ON

## 2011-02-14 ENCOUNTER — Inpatient Hospital Stay (HOSPITAL_COMMUNITY)
Admission: RE | Admit: 2011-02-14 | Discharge: 2011-02-14 | Disposition: A | Discharge status: Home / Self Care | Payer: PRIVATE HEALTH INSURANCE | Source: Ambulatory Visit | Attending: Emergency Medicine | Admitting: Emergency Medicine

## 2011-03-14 ENCOUNTER — Other Ambulatory Visit: Payer: Self-pay | Admitting: Internal Medicine

## 2011-03-14 DIAGNOSIS — Z1231 Encounter for screening mammogram for malignant neoplasm of breast: Secondary | ICD-10-CM

## 2011-04-05 ENCOUNTER — Ambulatory Visit
Admission: RE | Admit: 2011-04-05 | Discharge: 2011-04-05 | Disposition: A | Discharge status: Home / Self Care | Payer: Medicare Other | Source: Ambulatory Visit | Attending: Internal Medicine | Admitting: Internal Medicine

## 2011-04-05 DIAGNOSIS — Z1231 Encounter for screening mammogram for malignant neoplasm of breast: Secondary | ICD-10-CM

## 2011-05-10 ENCOUNTER — Ambulatory Visit (INDEPENDENT_AMBULATORY_CARE_PROVIDER_SITE_OTHER): Payer: Medicare Other | Admitting: Cardiovascular Disease

## 2011-05-10 ENCOUNTER — Encounter: Payer: Self-pay | Admitting: Cardiovascular Disease

## 2011-05-10 VITALS — BP 143/74 | HR 56 | Ht 63.5 in | Wt 183.0 lb

## 2011-05-10 DIAGNOSIS — I350 Nonrheumatic aortic (valve) stenosis: Secondary | ICD-10-CM

## 2011-05-10 DIAGNOSIS — I359 Nonrheumatic aortic valve disorder, unspecified: Secondary | ICD-10-CM

## 2011-05-10 NOTE — Assessment & Plan Note (Signed)
[name] is doing fairly well. Will any with her same medications. She has at least moderate and probably severe aortic stenosis. We'll repeat her echocardiogram. I'll see her again in 6 months.

## 2011-05-10 NOTE — Progress Notes (Signed)
    [name] Date of Birth  09/18/1932 [facility]     [place] Office  [address]    [address]   [address] [place], [place]  [address]    [place], [place]  [address] [phone]  Fax  [phone]  [phone]  Fax [phone]   History of Present Illness:  [name] is a 78 year old female with a history of aortic stenosis. She also has a history of diabetes, hypertension, hypothyroidism, and hypercholesterolemia. She was last seen in our office in October of 2011. Her last echocardiogram in November, 2000 reveals moderate to severe aortic stenosis.  She has some occasional episodes of dyspnea. She has dyspnea if she overexerts herself. She denies any chest pain.  She denies any syncope.    Current Outpatient Prescriptions on File Prior to Visit  Medication Sig Dispense Refill  . amLODipine-benazepril (LOTREL) 10-40 MG per capsule Take 1 capsule by mouth daily.        . carvedilol (COREG) 12.5 MG tablet Take 12.5 mg by mouth 2 (two) times daily.        . insulin glargine (LANTUS) 100 UNIT/ML injection Inject 18 Units into the skin daily. Lantus 18 units qd AM       . insulin lispro (HUMALOG) 100 UNIT/ML injection Inject 4-6 Units into the skin 3 (three) times daily.        Marland Kitchen levothyroxine (SYNTHROID, LEVOTHROID) 88 MCG tablet Take 88 mcg by mouth daily.        . potassium chloride (KLOR-CON) 10 MEQ CR tablet Take 10 mEq by mouth as needed.       . rosuvastatin (CRESTOR) 20 MG tablet Take 20 mg by mouth as needed.         No Known Allergies  Past Medical History  Diagnosis Date  . Thyroid disease   . Diabetes mellitus   . Hypertension   . Lumbago   . Aortic valve disorders   . Hyperlipidemia     No past surgical history on file.  History  Smoking status  . Never Smoker   Smokeless tobacco  . Not on file    History  Alcohol Use: Not on file    Family History  Problem Relation Age of Onset  . Heart disease Father     Reviw of  Systems:  Reviewed in the HPI.  All other systems are negative.  Physical Exam: BP 143/74  Pulse 56  Ht 5' 3.5" (1.613 m)  Wt 183 lb (83.008 kg)  BMI 31.91 kg/m2 The patient is alert and oriented x 3.  The mood and affect are normal.   Skin: warm and dry.  Color is normal.    HEENT:   [place]/ AT . No JVD, normal carotids  Lungs: clear   Heart: RR, 2/6 systolic    Abdomen: soft, + BS, non tender  Extremities:  No c/c/e  Neuro:  Non focal,  CN intact, gait is normal    ECG: Sinus bradycardia  Assessment / Plan:

## 2011-05-10 NOTE — Patient Instructions (Signed)
Your physician wants you to follow-up in: 6 months  You will receive a reminder letter in the mail two months in advance. If you don't receive a letter, please call our office to schedule the follow-up appointment.    Your physician has requested that you have an echocardiogram. Echocardiography is a painless test that uses sound waves to create images of your heart. It provides your doctor with information about the size and shape of your heart and how well your heart's chambers and valves are working. This procedure takes approximately one hour. There are no restrictions for this procedure.  Your physician recommends that you continue on your current medications as directed. Please refer to the Current Medication list given to you today.  

## 2011-05-15 ENCOUNTER — Other Ambulatory Visit: Payer: Self-pay | Admitting: *Deleted

## 2011-05-15 MED ORDER — HYDROCHLOROTHIAZIDE 12.5 MG PO TABS: 12.5000 mg | ORAL_TABLET | ORAL | Status: DC

## 2011-05-17 ENCOUNTER — Other Ambulatory Visit (HOSPITAL_COMMUNITY): Payer: Medicare Other | Admitting: Radiology

## 2011-05-23 ENCOUNTER — Telehealth: Payer: Self-pay | Admitting: *Deleted

## 2011-05-23 NOTE — Telephone Encounter (Signed)
Pt had cancelled echo will call back to reschedule, she has colonoscopy and two other dr apps and wanted to wait. Will call back.

## 2011-06-21 ENCOUNTER — Other Ambulatory Visit: Payer: Self-pay

## 2011-06-21 ENCOUNTER — Ambulatory Visit (HOSPITAL_COMMUNITY): Payer: Medicare Other | Attending: Cardiovascular Disease

## 2011-06-21 DIAGNOSIS — E669 Obesity, unspecified: Secondary | ICD-10-CM | POA: Insufficient documentation

## 2011-06-21 DIAGNOSIS — I350 Nonrheumatic aortic (valve) stenosis: Secondary | ICD-10-CM

## 2011-06-21 DIAGNOSIS — I379 Nonrheumatic pulmonary valve disorder, unspecified: Secondary | ICD-10-CM | POA: Insufficient documentation

## 2011-06-21 DIAGNOSIS — E119 Type 2 diabetes mellitus without complications: Secondary | ICD-10-CM | POA: Insufficient documentation

## 2011-06-21 DIAGNOSIS — I10 Essential (primary) hypertension: Secondary | ICD-10-CM | POA: Insufficient documentation

## 2011-06-21 DIAGNOSIS — I080 Rheumatic disorders of both mitral and aortic valves: Secondary | ICD-10-CM | POA: Insufficient documentation

## 2011-06-21 DIAGNOSIS — I079 Rheumatic tricuspid valve disease, unspecified: Secondary | ICD-10-CM | POA: Insufficient documentation

## 2011-06-21 DIAGNOSIS — I359 Nonrheumatic aortic valve disorder, unspecified: Secondary | ICD-10-CM

## 2011-06-21 DIAGNOSIS — E785 Hyperlipidemia, unspecified: Secondary | ICD-10-CM | POA: Insufficient documentation

## 2011-11-16 ENCOUNTER — Encounter: Payer: Self-pay | Admitting: Cardiovascular Disease

## 2012-01-11 ENCOUNTER — Other Ambulatory Visit: Payer: Self-pay | Admitting: Cardiovascular Disease

## 2012-01-11 NOTE — Telephone Encounter (Signed)
Fax Received. Refill Completed. [name] (R.M.A)   

## 2012-03-19 ENCOUNTER — Other Ambulatory Visit: Payer: Self-pay | Admitting: Internal Medicine

## 2012-03-19 DIAGNOSIS — Z1231 Encounter for screening mammogram for malignant neoplasm of breast: Secondary | ICD-10-CM

## 2012-04-29 ENCOUNTER — Ambulatory Visit
Admission: RE | Admit: 2012-04-29 | Discharge: 2012-04-29 | Disposition: A | Discharge status: Home / Self Care | Payer: Medicare Other | Source: Ambulatory Visit | Attending: Internal Medicine | Admitting: Internal Medicine

## 2012-04-29 DIAGNOSIS — Z1231 Encounter for screening mammogram for malignant neoplasm of breast: Secondary | ICD-10-CM

## 2012-05-09 ENCOUNTER — Ambulatory Visit: Payer: Medicare Other | Admitting: Cardiovascular Disease

## 2012-05-20 ENCOUNTER — Ambulatory Visit (INDEPENDENT_AMBULATORY_CARE_PROVIDER_SITE_OTHER): Payer: Medicare Other | Admitting: Cardiovascular Disease

## 2012-05-20 ENCOUNTER — Encounter: Payer: Self-pay | Admitting: Cardiovascular Disease

## 2012-05-20 VITALS — BP 130/70 | HR 61 | Resp 18 | Ht 63.0 in | Wt 185.1 lb

## 2012-05-20 DIAGNOSIS — I350 Nonrheumatic aortic (valve) stenosis: Secondary | ICD-10-CM

## 2012-05-20 DIAGNOSIS — I10 Essential (primary) hypertension: Secondary | ICD-10-CM

## 2012-05-20 DIAGNOSIS — E785 Hyperlipidemia, unspecified: Secondary | ICD-10-CM | POA: Insufficient documentation

## 2012-05-20 DIAGNOSIS — I359 Nonrheumatic aortic valve disorder, unspecified: Secondary | ICD-10-CM

## 2012-05-20 DIAGNOSIS — E039 Hypothyroidism, unspecified: Secondary | ICD-10-CM

## 2012-05-20 NOTE — Assessment & Plan Note (Signed)
[name] has a history of moderate to severe aortic stenosis. She remains basically symptom-free period we'll repeat her echocardiogram. She may be a good candidate for the valve clinic with Dr. Excell Seltzer and Cornelius Moras.   If he develops symptoms will be to go ahead and refer her for aortic valve replacement.

## 2012-05-20 NOTE — Progress Notes (Signed)
[name] Date of Birth  02-01-1933 [facility]     [place] Office  [address]    [address]   [address] [place], [place]  [address]    [place], [place]  [address] [phone]  Fax  [phone]  [phone]  Fax [phone]  Problem list: 1. Aortic stenosis 2. Diabetes mellitus 3. Hypertension 4. Hypothyroidism 5. Hypercholesterolemia  History of Present Illness:  [name] is a 79 year old female with a history of aortic stenosis. She also has a history of diabetes, hypertension, hypothyroidism, and hypercholesterolemia. She was last seen in our office in October of 2011. Her last echocardiogram in November, 2000 reveals moderate to severe aortic stenosis.  She has some occasional episodes of dyspnea. She has dyspnea if she overexerts herself. She denies any chest pain.  She denies any syncope.  May 20, 2012:  I saw [name] about a year ago.  She has gained some weight.  She has just started back exercising again and has some exertional dyspnea.  She denies any CP or syncope.   Current Outpatient Prescriptions on File Prior to Visit  Medication Sig Dispense Refill  . amLODipine-benazepril (LOTREL) 10-40 MG per capsule Take 1 capsule by mouth daily.        . carvedilol (COREG) 12.5 MG tablet Take 12.5 mg by mouth 2 (two) times daily.        . hydrochlorothiazide (MICROZIDE) 12.5 MG capsule TAKE 1 TABLET (12.5 MG TOTAL) BY MOUTH AS DIRECTED.  30 capsule  6  . insulin glargine (LANTUS) 100 UNIT/ML injection Inject 22 Units into the skin daily. Lantus 22 units qd AM      . insulin lispro (HUMALOG) 100 UNIT/ML injection Inject 4-6 Units into the skin 3 (three) times daily.        Marland Kitchen levothyroxine (SYNTHROID, LEVOTHROID) 88 MCG tablet Take 100 mcg by mouth daily.       Marland Kitchen METFORMIN HCL PO Take 750 mg by mouth 2 (two) times daily. Pt unsure of Dose      . potassium chloride (KLOR-CON) 10 MEQ CR tablet Take 10 mEq by mouth as needed.       .  rosuvastatin (CRESTOR) 20 MG tablet Take 20 mg by mouth as needed.         No Known Allergies  Past Medical History  Diagnosis Date  . Thyroid disease   . Diabetes mellitus   . Hypertension   . Lumbago   . Aortic valve disorders   . Hyperlipidemia     No past surgical history on file.  History  Smoking status  . Never Smoker   Smokeless tobacco  . Not on file    History  Alcohol Use: Not on file    Family History  Problem Relation Age of Onset  . Heart disease Father     Reviw of Systems:  Reviewed in the HPI.  All other systems are negative.  Physical Exam: BP 130/70  Pulse 61  Resp 18  Ht 5\' 3"  (1.6 m)  Wt 185 lb 1.9 oz (83.97 kg)  BMI 32.79 kg/m2  SpO2 98% The patient is alert and oriented x 3.  The mood and affect are normal.   Skin: warm and dry.  Color is normal.    HEENT:   [place]/ AT . No JVD, normal carotids  Lungs: clear   Heart: RR, 3/6 systolic    Abdomen: soft, + BS, non tender  Extremities:  No c/c/e,  pulses are 1+   Neuro:  Non focal,  CN intact, gait is normal    ECG: May 20, 2012:  NSR at 61, no ST or T wave changes  Assessment / Plan:

## 2012-05-20 NOTE — Patient Instructions (Addendum)
Your physician wants you to follow-up in: 6 MONTHS.  You will receive a reminder letter in the mail two months in advance. If you don't receive a letter, please call our office to schedule the follow-up appointment.  Your physician has requested that you have an echocardiogram. Echocardiography is a painless test that uses sound waves to create images of your heart. It provides your doctor with information about the size and shape of your heart and how well your heart's chambers and valves are working. This procedure takes approximately one hour. There are no restrictions for this procedure.  Your physician recommends that you continue on your current medications as directed. Please refer to the Current Medication list given to you today.  

## 2012-05-27 ENCOUNTER — Encounter (HOSPITAL_COMMUNITY): Payer: Self-pay | Admitting: General Practice

## 2012-05-28 ENCOUNTER — Ambulatory Visit (HOSPITAL_COMMUNITY): Payer: Medicare Other | Attending: Internal Medicine | Admitting: Radiology

## 2012-05-28 DIAGNOSIS — I10 Essential (primary) hypertension: Secondary | ICD-10-CM | POA: Insufficient documentation

## 2012-05-28 DIAGNOSIS — E119 Type 2 diabetes mellitus without complications: Secondary | ICD-10-CM | POA: Insufficient documentation

## 2012-05-28 DIAGNOSIS — I080 Rheumatic disorders of both mitral and aortic valves: Secondary | ICD-10-CM | POA: Insufficient documentation

## 2012-05-28 DIAGNOSIS — I359 Nonrheumatic aortic valve disorder, unspecified: Secondary | ICD-10-CM

## 2012-05-28 DIAGNOSIS — I350 Nonrheumatic aortic (valve) stenosis: Secondary | ICD-10-CM

## 2012-05-28 DIAGNOSIS — I079 Rheumatic tricuspid valve disease, unspecified: Secondary | ICD-10-CM | POA: Insufficient documentation

## 2012-05-28 DIAGNOSIS — E785 Hyperlipidemia, unspecified: Secondary | ICD-10-CM | POA: Insufficient documentation

## 2012-05-28 DIAGNOSIS — E669 Obesity, unspecified: Secondary | ICD-10-CM | POA: Insufficient documentation

## 2012-05-28 NOTE — Progress Notes (Signed)
Echocardiogram performed.  

## 2012-05-29 ENCOUNTER — Telehealth: Payer: Self-pay | Admitting: *Deleted

## 2012-05-29 ENCOUNTER — Encounter: Payer: Self-pay | Admitting: *Deleted

## 2012-05-29 DIAGNOSIS — I350 Nonrheumatic aortic (valve) stenosis: Secondary | ICD-10-CM

## 2012-05-29 DIAGNOSIS — I38 Endocarditis, valve unspecified: Secondary | ICD-10-CM

## 2012-05-29 NOTE — Telephone Encounter (Signed)
Follow-up: ° ° ° °Patient called in returning your call.  Please call back. °

## 2012-05-29 NOTE — Telephone Encounter (Signed)
Pt called and will come Friday for labs

## 2012-05-29 NOTE — Telephone Encounter (Signed)
Message copied by Antony Odea on Wed May 29, 2012  2:39 PM ------      Message from: [place], [place] J      Created: Tue May 28, 2012  6:18 PM       She would like to have cath Feb. 4.  I have discussed with her.  She will need labs.

## 2012-05-29 NOTE — Telephone Encounter (Signed)
Left msg to call back to discuss appointment date and time.

## 2012-05-31 ENCOUNTER — Encounter: Payer: Medicare Other | Admitting: Thoracic Surgery (Cardiothoracic Vascular Surgery)

## 2012-05-31 ENCOUNTER — Other Ambulatory Visit (INDEPENDENT_AMBULATORY_CARE_PROVIDER_SITE_OTHER): Payer: Medicare Other

## 2012-05-31 DIAGNOSIS — I359 Nonrheumatic aortic valve disorder, unspecified: Secondary | ICD-10-CM

## 2012-05-31 DIAGNOSIS — I38 Endocarditis, valve unspecified: Secondary | ICD-10-CM

## 2012-05-31 DIAGNOSIS — I350 Nonrheumatic aortic (valve) stenosis: Secondary | ICD-10-CM

## 2012-05-31 DIAGNOSIS — Z01810 Encounter for preprocedural cardiovascular examination: Secondary | ICD-10-CM

## 2012-05-31 LAB — CBC WITH DIFFERENTIAL/PLATELET
Basophils Relative: 0.5 % (ref 0.0–3.0)
Eosinophils Absolute: 0.1 10*3/uL (ref 0.0–0.7)
Eosinophils Relative: 1.7 % (ref 0.0–5.0)
HCT: 30.3 % — ABNORMAL LOW (ref 36.0–46.0)
Lymphs Abs: 1.6 10*3/uL (ref 0.7–4.0)
MCHC: 33.5 g/dL (ref 30.0–36.0)
MCV: 92 fl (ref 78.0–100.0)
Monocytes Absolute: 0.3 10*3/uL (ref 0.1–1.0)
RBC: 3.3 Mil/uL — ABNORMAL LOW (ref 3.87–5.11)
WBC: 4.4 10*3/uL — ABNORMAL LOW (ref 4.5–10.5)

## 2012-05-31 LAB — BASIC METABOLIC PANEL
BUN: 13 mg/dL (ref 6–23)
CO2: 29 mEq/L (ref 19–32)
Chloride: 96 mEq/L (ref 96–112)
Creatinine, Ser: 0.8 mg/dL (ref 0.4–1.2)
Potassium: 3.6 mEq/L (ref 3.5–5.1)

## 2012-06-04 ENCOUNTER — Inpatient Hospital Stay (HOSPITAL_BASED_OUTPATIENT_CLINIC_OR_DEPARTMENT_OTHER)
Admission: RE | Admit: 2012-06-04 | Discharge: 2012-06-04 | Disposition: A | Discharge status: Home / Self Care | Payer: Medicare Other | Source: Ambulatory Visit | Attending: Cardiovascular Disease | Admitting: Cardiovascular Disease

## 2012-06-04 ENCOUNTER — Encounter (HOSPITAL_BASED_OUTPATIENT_CLINIC_OR_DEPARTMENT_OTHER)
Admission: RE | Disposition: A | Discharge status: Home / Self Care | Payer: Self-pay | Source: Ambulatory Visit | Attending: Cardiovascular Disease

## 2012-06-04 DIAGNOSIS — E119 Type 2 diabetes mellitus without complications: Secondary | ICD-10-CM | POA: Insufficient documentation

## 2012-06-04 DIAGNOSIS — I10 Essential (primary) hypertension: Secondary | ICD-10-CM | POA: Insufficient documentation

## 2012-06-04 DIAGNOSIS — I359 Nonrheumatic aortic valve disorder, unspecified: Secondary | ICD-10-CM | POA: Insufficient documentation

## 2012-06-04 DIAGNOSIS — E039 Hypothyroidism, unspecified: Secondary | ICD-10-CM | POA: Insufficient documentation

## 2012-06-04 DIAGNOSIS — E7800 Pure hypercholesterolemia, unspecified: Secondary | ICD-10-CM | POA: Insufficient documentation

## 2012-06-04 DIAGNOSIS — I2510 Atherosclerotic heart disease of native coronary artery without angina pectoris: Secondary | ICD-10-CM

## 2012-06-04 LAB — POCT I-STAT 3, ART BLOOD GAS (G3+)
Acid-Base Excess: 2 mmol/L (ref 0.0–2.0)
Bicarbonate: 27.1 mEq/L — ABNORMAL HIGH (ref 20.0–24.0)
O2 Saturation: 91 %
TCO2: 28 mmol/L (ref 0–100)
pO2, Arterial: 64 mmHg — ABNORMAL LOW (ref 80.0–100.0)

## 2012-06-04 LAB — POCT I-STAT 3, VENOUS BLOOD GAS (G3P V)
Bicarbonate: 26.7 mEq/L — ABNORMAL HIGH (ref 20.0–24.0)
O2 Saturation: 61 %
TCO2: 28 mmol/L (ref 0–100)
pH, Ven: 7.329 — ABNORMAL HIGH (ref 7.250–7.300)

## 2012-06-04 SURGERY — JV LEFT AND RIGHT HEART CATHETERIZATION WITH CORONARY ANGIOGRAM
Anesthesia: Moderate Sedation

## 2012-06-04 MED ORDER — ACETAMINOPHEN 325 MG PO TABS: 650.0000 mg | ORAL_TABLET | ORAL | Status: DC | PRN

## 2012-06-04 MED ORDER — SODIUM CHLORIDE 0.9 % IV SOLN: INTRAVENOUS | Status: DC

## 2012-06-04 MED ORDER — ONDANSETRON HCL 4 MG/2ML IJ SOLN: 4.0000 mg | Freq: Four times a day (QID) | INTRAMUSCULAR | Status: DC | PRN

## 2012-06-04 NOTE — Interval H&P Note (Signed)
History and Physical Interval Note:  06/04/2012 9:50 AM  [name]  has presented today for surgery, with the diagnosis of AS  The various methods of treatment have been discussed with the patient and family. After consideration of risks, benefits and other options for treatment, the patient has consented to  Procedure(s) (LRB) with comments: JV LEFT AND RIGHT HEART CATHETERIZATION WITH CORONARY ANGIOGRAM (N/A) as a surgical intervention .  The patient's history has been reviewed, patient examined, no change in status, stable for surgery.  I have reviewed the patient's chart and labs.  Questions were answered to the patient's satisfaction.     [name].

## 2012-06-04 NOTE — OR Nursing (Signed)
Tegaderm dressing applied, site level 0, bedrest begins at 1030 

## 2012-06-04 NOTE — OR Nursing (Signed)
Meal served 

## 2012-06-04 NOTE — OR Nursing (Signed)
Dr Nahser at bedside to discuss results and treatment plan with pt and family 

## 2012-06-04 NOTE — Progress Notes (Signed)
Pt consumed lunch without any difficult ,   Voided 200cc of yellow urine.

## 2012-06-04 NOTE — OR Nursing (Signed)
Discharge instructions reviewed and signed, pt stated understanding, ambulated in hall without difficulty, site level 0, transported to friend's car via wheelchair 

## 2012-06-04 NOTE — CV Procedure (Signed)
    Cardiac Cath Note  [name] [id number] Aug 09, 1932  Procedure: Right and left Heart Cardiac Catheterization Note Indications: Aortic stenosis  Procedure Details Consent: Obtained Time Out: Verified patient identification, verified procedure, site/side was marked, verified correct patient position, special equipment/implants available, Radiology Safety Procedures followed,  medications/allergies/relevent history reviewed, required imaging and test results available.  Performed   Medications: Fentanyl: 50 mcg IV Versed: 2 mg IV  The right femoral artery and right femoral vein were easily canulated using a modified Seldinger technique.  Hemodynamics:   RA: 9/2/2 RV: 31/2 PCWP: 7/4 PA:  31/6  Cardiac Output   Thermodilution:  3.2  Inded = 1.7  Fick : 5.6   Index = 3.0  Arterial Sat: 91 % PA Sat: 61%.  LV pressure:  Not obtained Aortic pressure:  126/48  Angiography   Aortic arch : very heavily calcified  Left Main: heavily calcified, 80% eccentric distal stenosis  Left anterior Descending: heavily calcified proximal,  Mild - moderate irregularities . diags are small  Left Circumflex: moderate sized vessel.  Right Coronary Artery: large, dominant, heavily calcified.   There is a 30% mid stenosis.  PDA and PLSA are unremarkable  LV Gram: not performed.  Complications: No apparent complications Patient did tolerate procedure well.  Contrast used: 60 cc  Conclusions:   1. Significant stenosis in the distal LM 2. Severe Aortic stenosis ( mean gradient of 50 by echo)  Will refer for AVR and CABG.   Vesta Mixer, Montez Hageman., MD, [facility] 06/04/2012, 10:21 AM Office - [phone] Pager [phone]

## 2012-06-04 NOTE — H&P (Signed)
[name]  Date of Birth [birth year]  [facility] [place] Office  [address]  [place], [place] [address] [place], [place] [address]  [phone] Fax [phone] Fax [phone]  Problem list:  1. Aortic stenosis  2. Diabetes mellitus  3. Hypertension  4. Hypothyroidism  5. Hypercholesterolemia  History of Present Illness:  [name] is a 79 year old female with a history of aortic stenosis. She also has a history of diabetes, hypertension, hypothyroidism, and hypercholesterolemia. She was last seen in our office in October of 2011. Her last echocardiogram in November, 2000 reveals moderate to severe aortic stenosis.  She has some occasional episodes of dyspnea. She has dyspnea if she overexerts herself. She denies any chest pain. She denies any syncope.  May 20, 2012:  I saw [name] about a year ago. She has gained some weight. She has just started back exercising again and has some exertional dyspnea. She denies any CP or syncope.  Current Outpatient Prescriptions on File Prior to Visit   Medication  Sig  Dispense  Refill   .  amLODipine-benazepril (LOTREL) 10-40 MG per capsule  Take 1 capsule by mouth daily.     .  carvedilol (COREG) 12.5 MG tablet  Take 12.5 mg by mouth 2 (two) times daily.     .  hydrochlorothiazide (MICROZIDE) 12.5 MG capsule  TAKE 1 TABLET (12.5 MG TOTAL) BY MOUTH AS DIRECTED.  30 capsule  6   .  insulin glargine (LANTUS) 100 UNIT/ML injection  Inject 22 Units into the skin daily. Lantus 22 units qd AM     .  insulin lispro (HUMALOG) 100 UNIT/ML injection  Inject 4-6 Units into the skin 3 (three) times daily.     Marland Kitchen  levothyroxine (SYNTHROID, LEVOTHROID) 88 MCG tablet  Take 100 mcg by mouth daily.     Marland Kitchen  METFORMIN HCL PO  Take 750 mg by mouth 2 (two) times daily. Pt unsure of Dose     .  potassium chloride (KLOR-CON) 10 MEQ CR tablet  Take 10 mEq by mouth as needed.     .  rosuvastatin (CRESTOR) 20 MG  tablet  Take 20 mg by mouth as needed.      No Known Allergies  Past Medical History   Diagnosis  Date   .  Thyroid disease    .  Diabetes mellitus    .  Hypertension    .  Lumbago    .  Aortic valve disorders    .  Hyperlipidemia     No past surgical history on file.  History   Smoking status   .  Never Smoker   Smokeless tobacco   .  Not on file    History   Alcohol Use:  Not on file    Family History   Problem  Relation  Age of Onset   .  Heart disease  Father     Reviw of Systems:  Reviewed in the HPI. All other systems are negative.  Physical Exam:  BP 130/70  Pulse 61  Resp 18  Ht 5\' 3"  (1.6 m)  Wt 185 lb 1.9 oz (83.97 kg)  BMI 32.79 kg/m2  SpO2 98%  The patient is alert and oriented x 3. The mood and affect are normal.  Skin: warm and dry. Color is normal.  HEENT: [place]/ AT . No JVD, normal carotids  Lungs: clear  Heart: RR, 3/6 systolic  Abdomen: soft, + BS,  non tender  Extremities: No c/c/e, pulses are 1+  Neuro: Non focal, CN intact, gait is normal  ECG:  May 20, 2012: NSR at 61, no ST or T wave changes  Assessment / Plan:   Aortic stenosis - [name]., MD 05/20/2012 5:13 PM Signed  [name] has a history of moderate to severe aortic stenosis. She remains basically symptom-free period we'll repeat her echocardiogram. She may be a good candidate for the valve clinic with Dr. Excell Seltzer and Cornelius Moras. If he develops symptoms will be to go ahead and refer her for aortic valve replacement.  Vesta Mixer, Montez Hageman., MD, [facility] 06/04/2012, 9:49 AM Office - [phone] Pager [phone]

## 2012-06-11 ENCOUNTER — Telehealth: Payer: Self-pay | Admitting: Cardiovascular Disease

## 2012-06-11 NOTE — Telephone Encounter (Signed)
Denies site enlargement, pain, redness or oozing. Unsure if a closure device was used but told pt to call with any of the prior listed signs, pt feeling well just noticed pea size lump. Pt agreed to call with questions or concerns,

## 2012-06-11 NOTE — Telephone Encounter (Signed)
Pt has small lump on groin from procedure  06-04-12 no pain is this normal, pls call home or  cell [phone]

## 2012-06-13 ENCOUNTER — Encounter: Payer: Medicare Other | Admitting: Cardiothoracic Surgery

## 2012-06-17 ENCOUNTER — Ambulatory Visit (INDEPENDENT_AMBULATORY_CARE_PROVIDER_SITE_OTHER): Payer: Medicare Other | Admitting: Cardiothoracic Surgery

## 2012-06-17 ENCOUNTER — Encounter: Payer: Medicare Other | Admitting: Thoracic Surgery (Cardiothoracic Vascular Surgery)

## 2012-06-17 ENCOUNTER — Other Ambulatory Visit: Payer: Self-pay | Admitting: *Deleted

## 2012-06-17 ENCOUNTER — Encounter: Payer: Self-pay | Admitting: Cardiothoracic Surgery

## 2012-06-17 VITALS — BP 157/83 | HR 63 | Resp 20 | Ht 63.0 in | Wt 182.0 lb

## 2012-06-17 DIAGNOSIS — I359 Nonrheumatic aortic valve disorder, unspecified: Secondary | ICD-10-CM

## 2012-06-17 DIAGNOSIS — I350 Nonrheumatic aortic (valve) stenosis: Secondary | ICD-10-CM

## 2012-06-17 DIAGNOSIS — I2510 Atherosclerotic heart disease of native coronary artery without angina pectoris: Secondary | ICD-10-CM

## 2012-06-17 DIAGNOSIS — E1165 Type 2 diabetes mellitus with hyperglycemia: Secondary | ICD-10-CM | POA: Insufficient documentation

## 2012-06-17 DIAGNOSIS — E119 Type 2 diabetes mellitus without complications: Secondary | ICD-10-CM | POA: Insufficient documentation

## 2012-06-17 NOTE — Progress Notes (Signed)
[address]            [place] [address]          [phone]      [name] [facility] Medical Record #[id number] Date of Birth: [birth year]  Referring: Nahser, Deloris Ping, MD Primary Care: Willey Blade, MD Endo; Dr Lucianne Muss   Chief Complaint:    Chief Complaint  Patient presents with  . Aortic Stenosis    Referral from Dr Elease Hashimoto for surgical eval on aortic stenosis, Echo 05/28/12    History of Present Illness:    Patient is a 79 year old diabetic female with known aortic stenosis. She is noted increasing shortness of breath with minimal exertion over the past one to 2 years. More recently it has involved chest discomfort radiating to the neck associated with shortness of breath. She denies syncope or presyncope. She notes mild pedal edema. She denies shortness of breath at rest but climbing as few as 12 stairs requires her to stop.  Echocardiogram in Sep 13, 2009 revealed aortic valve area of 1.03 peak gradient of 41 mm mean gradient 25 maximum velocity 322 cm/s. In 09-14-11 the aortic valve gradient dropped to 0.76 and the peak velocity was 410 cm/s with a mean gradient of 44 and a peak gradient of 67. In 09/13/12 the peak gradient had increased to 85 mean gradient 49 with a velocity across the aortic valve of 460 cm/s. LV function has been preserved by echocardiogram ventriculogram has not been done.      Current Activity/ Functional Status:  Patient is independent with mobility/ambulation, transfers, ADL's, IADL's.  Zubrod Score: At the time of surgery this patient's most appropriate activity status/level should be described as: []  Normal activity, no symptoms [x]  Symptoms, fully ambulatory []  Symptoms, in bed less than or equal to 50% of the time []  Symptoms, in bed greater than 50% of the time but less than 100% []  Bedridden []  Moribund   Past Medical History  Diagnosis Date  . Thyroid disease   . Diabetes mellitus   . Hypertension   . Lumbago    . Aortic valve disorders   . Hyperlipidemia     No past surgical history on file.  Family History  Problem Relation Age of Onset  . Heart disease Father     History   Social History  . Marital Status: Single    Spouse Name: N/A    Number of Children: N/A  . Years of Education: N/A   Occupational History  . Not on file.   Social History Main Topics  . Smoking status: Never Smoker   . Smokeless tobacco: Not on file  . Alcohol Use: Not on file  . Drug Use: Not on file  . Sexually Active: Not on file   Other Topics Concern  . retired   Social History Narrative  Lives with her sister, Husband had bypass surgery in 1994-09-14, died 09/13/01     History  Smoking status  . Never Smoker   Smokeless tobacco  . Not on file    History  Alcohol Use: Not on file     No Known Allergies  Current Outpatient Prescriptions  Medication Sig Dispense Refill  . aspirin 81 MG tablet Take 81 mg by mouth daily.      . carvedilol (COREG) 12.5 MG tablet Take 12.5 mg by mouth 2 (two) times daily.        Marland Kitchen  hydrochlorothiazide (MICROZIDE) 12.5 MG capsule TAKE 1 TABLET (12.5 MG TOTAL) BY MOUTH AS DIRECTED.  30 capsule  6  . insulin glargine (LANTUS) 100 UNIT/ML injection Inject 22 Units into the skin daily. Lantus 22 units qd AM      . insulin lispro (HUMALOG) 100 UNIT/ML injection Inject 4-6 Units into the skin 3 (three) times daily.        Marland Kitchen levothyroxine (SYNTHROID, LEVOTHROID) 88 MCG tablet Take 100 mcg by mouth daily.       Marland Kitchen METFORMIN HCL PO Take 750 mg by mouth 2 (two) times daily. Pt unsure of Dose      . potassium chloride (KLOR-CON) 10 MEQ CR tablet Take 10 mEq by mouth as needed.       . rosuvastatin (CRESTOR) 20 MG tablet Take 20 mg by mouth as needed.       . benazepril (LOTENSIN) 40 MG tablet Take 40 mg by mouth daily.       . felodipine (PLENDIL) 10 MG 24 hr tablet Take 10 mg by mouth.        No current facility-administered medications for this visit.       Review of  Systems:     Cardiac Review of Systems: Y or N  Chest Pain [  y  ]  Resting SOB [ n  ] Exertional SOB  [ y ]  Pollyann Kennedy Milo.Brash  ]   Pedal Edema [  y ]    Palpitations [ n ] Syncope  [n  ]   Presyncope [ n  ]  General Review of Systems: [Y] = yes [  ]=no Constitional: recent weight change [  ]; anorexia [  ]; fatigue [  ]; nausea [  ]; night sweats [  ]; fever [  ]; or chills [  ];                                                                                                                                          Dental: poor dentition[ dentures ]; Last Dentist visit:   Eye : blurred vision [  ]; diplopia [   ]; vision changes [  ];  Amaurosis fugax[  ]; Resp: cough [  ];  wheezing[  ];  hemoptysis[  ]; shortness of breath[  ]; paroxysmal nocturnal dyspnea[  ]; dyspnea on exertion[  ]; or orthopnea[  ];  GI:  gallstones[  ], vomiting[  ];  dysphagia[  ]; melena[  ];  hematochezia [  ]; heartburn[  ];   Hx of  Colonoscopy[ y ]; GU: kidney stones [  ]; hematuria[  ];   dysuria [  ];  nocturia[  ];  history of     obstruction [  ]; urinary frequency [ y ]             Skin: rash, swelling[  ];, hair loss[  ];  peripheral edema[  ];  or itching[  ]; Musculosketetal: myalgias[  ];  joint swelling[  ];  joint erythema[  ];  joint pain[  ];  back pain[  ];  Heme/Lymph: bruising[  ];  bleeding[  ];  anemia[  ];  Neuro: TIA[  ];  headaches[  ];  stroke[  ];  vertigo[  ];  seizures[  ];   paresthesias[  ];  difficulty walking[  ];  Psych:depression[  ]; Kendell Bane  ];  Endocrine: diabetes[y  ];  thyroid dysfunction[ y ];  Immunizations: Flu Cove.Etienne  ]; Pneumococcal[ y ];  Other:  Physical Exam: BP 157/83  Pulse 63  Resp 20  Ht 5\' 3"  (1.6 m)  Wt 182 lb (82.555 kg)  BMI 32.25 kg/m2  SpO2 98%  General appearance: alert, cooperative and no distress Neurologic: intact Heart: systolic murmur: holosystolic 4/6, grunting and harsh throughout the precordium and diastolic murmur: early diastolic 2/6, blowing at  2nd right intercostal space Lungs: clear to auscultation bilaterally and normal percussion bilaterally Abdomen: soft, non-tender; bowel sounds normal; no masses,  no organomegaly Extremities: extremities normal, atraumatic, no cyanosis or edema and Homans sign is negative, no sign of DVT Patient has murmur referred to the carotids, has no cervical or supraclavicular adenopathy, he has palpable DP and PT pulses bilaterally   Diagnostic Studies & Laboratory data:     Recent Radiology Findings:  No results found.   Recent Lab Findings: Lab Results  Component Value Date   WBC 4.4* 05/31/2012   HGB 10.2* 05/31/2012   HCT 30.3* 05/31/2012   PLT 224.0 05/31/2012   GLUCOSE 196* 06/04/2012   NA 133* 05/31/2012   K 3.6 05/31/2012   CL 96 05/31/2012   CREATININE 0.8 05/31/2012   BUN 13 05/31/2012   CO2 29 05/31/2012   INR 1.1* 05/31/2012   CATH: The right femoral artery and right femoral vein were easily canulated using a modified Seldinger technique.  Hemodynamics:  RA: 9/2/2  RV: 31/2  PCWP: 7/4  PA: 31/6  Cardiac Output  Thermodilution: 3.2 Inded = 1.7  Fick : 5.6 Index = 3.0  Arterial Sat: 91 %  PA Sat: 61%.  LV pressure: Not obtained  Aortic pressure: 126/48  Angiography  Aortic arch : very heavily calcified  Left Main: heavily calcified, 80% eccentric distal stenosis  Left anterior Descending: heavily calcified proximal, Mild - moderate irregularities . diags are small  Left Circumflex: moderate sized vessel.  Right Coronary Artery: large, dominant, heavily calcified. There is a 30% mid stenosis. PDA and PLSA are unremarkable  LV Gram: not performed.  Complications: No apparent complications  Patient did tolerate procedure well.  Contrast used: 60 cc  Conclusions:  1. Significant stenosis in the distal LM  2. Severe Aortic stenosis ( mean gradient of 50 by echo)  Will refer for AVR and CABG.  Vesta Mixer, Montez Hageman., MD, [facility]  06/04/2012, 10:21 AM  ECHO: LV EF: 60% -  65%  ------------------------------------------------------------ Indications: Aortic stenosis 424.1.  ------------------------------------------------------------ History: PMH: Acquired from the patient and from the patient's chart. 3/6 Systolic murmur. Borderline significant aortic stenosis. Risk factors: Hypertension. Diabetes mellitus. Obese. Dyslipidemia.  ------------------------------------------------------------ Study Conclusions  - Left ventricle: The cavity size was normal. Wall thickness was increased in a pattern of mild LVH. Systolic function was normal. The estimated ejection fraction was in the range of 60% to 65%. Wall motion was normal; there were no regional wall motion abnormalities. Doppler parameters are consistent with abnormal left  ventricular relaxation (grade 1 diastolic dysfunction). - Aortic valve: There was severe stenosis. Mean gradient: 49mm Hg (S). Peak gradient: 85mm Hg (S). - Mitral valve: Mildly calcified annulus. Normal thickness leaflets . Mild regurgitation. - Left atrium: The atrium was mildly dilated. - Pulmonary arteries: Systolic pressure was mildly increased. PA peak pressure: 39mm Hg (S).  ------------------------------------------------------------ Labs, prior tests, procedures, and surgery: Echocardiography (February 2013). The aortic valve showed moderate to severe stenosis. EF was 60% and PA pressure was 39 (systolic). Aortic valve: peak gradient of 67mm Hg and mean gradient of 44mm Hg.  Transthoracic echocardiography. M-mode, complete 2D, spectral Doppler, and color Doppler. Height: Height: 160cm. Height: 63in. Weight: Weight: 83.9kg. Weight: 184.6lb. Body mass index: BMI: 32.8kg/m^2. Body surface area: BSA: 1.70m^2. Blood pressure: 130/70. Patient status: Outpatient. Location: [place] [facility]  ------------------------------------------------------------  ------------------------------------------------------------ Left  ventricle: The cavity size was normal. Wall thickness was increased in a pattern of mild LVH. Systolic function was normal. The estimated ejection fraction was in the range of 60% to 65%. Wall motion was normal; there were no regional wall motion abnormalities. Doppler parameters are consistent with abnormal left ventricular relaxation (grade 1 diastolic dysfunction).  ------------------------------------------------------------ Aortic valve: Trileaflet; moderately calcified leaflets. Doppler: There was severe stenosis. No significant regurgitation. VTI ratio of LVOT to aortic valve: 0.24. Valve area: 0.69cm^2(VTI). Indexed valve area: 0.37cm^2/m^2 (VTI). Peak velocity ratio of LVOT to aortic valve: 0.23. Valve area: 0.65cm^2 (Vmax). Indexed valve area: 0.35cm^2/m^2 (Vmax). Mean gradient: 49mm Hg (S). Peak gradient: 85mm Hg (S).  ------------------------------------------------------------ Aorta: The aorta was normal, not dilated, and non-diseased.  ------------------------------------------------------------ Mitral valve: Mildly calcified annulus. Normal thickness leaflets . Doppler: There was no evidence for stenosis. Mild regurgitation. Peak gradient: 4mm Hg (D).  ------------------------------------------------------------ Left atrium: The atrium was mildly dilated.  ------------------------------------------------------------ Right ventricle: The cavity size was normal. Wall thickness was normal. Systolic function was normal.  ------------------------------------------------------------ Pulmonic valve: Structurally normal valve. Cusp separation was normal. Doppler: Transvalvular velocity was within the normal range. No regurgitation.  ------------------------------------------------------------ Tricuspid valve: Structurally normal valve. Leaflet separation was normal. Doppler: Transvalvular velocity was within the normal range. Mild  regurgitation.  ------------------------------------------------------------ Pulmonary artery: The main pulmonary artery was normal-sized. Systolic pressure was mildly increased.  ------------------------------------------------------------ Right atrium: The atrium was normal in size.  ------------------------------------------------------------ Pericardium: The pericardium was normal in appearance.  ------------------------------------------------------------ Systemic veins: Inferior vena cava: The vessel was normal in size; the respirophasic diameter changes were in the normal range (= 50%); findings are consistent with normal central venous pressure.  ------------------------------------------------------------ Post procedure conclusions Ascending Aorta:  - The aorta was normal, not dilated, and non-diseased.  ------------------------------------------------------------  2D measurements Normal Doppler measurements Normal Left ventricle Main pulmonary LVID ED, 43.6 mm 43-52 artery chord, Pressure, 39 mm Hg =30 PLAX S LVID ES, 31.2 mm 23-38 Left ventricle chord, Ea, lat 7.02 cm/s ------ PLAX ann, tiss FS, chord, 28 % >29 DP PLAX E/Ea, lat 13.5 ------ LVPW, ED 10.4 mm ------ ann, tiss 8 IVS/LVPW 1.13 <1.3 DP ratio, ED Ea, med 5.92 cm/s ------ Ventricular septum ann, tiss IVS, ED 11.7 mm ------ DP LVOT E/Ea, med 16.1 ------ Diam, S 19 mm ------ ann, tiss Area 2.84 cm^2 ------ DP Diam 19 mm ------ LVOT Aorta Peak vel, 105 cm/s ------ Root diam, 25 mm ------ S ED VTI, S 27.3 cm ------ Left atrium Stroke vol 77.4 ml ------ AP dim 44 mm ------ Stroke 41.4 ml/m^2 ------ AP dim 2.35 cm/m^2 <2.2 index index Aortic valve Peak vel,  460 cm/s ------ S Mean vel, 329 cm/s ------ S VTI, S 113 cm ------ Mean 49 mm Hg ------ gradient, S Peak 85 mm Hg ------ gradient, S VTI ratio 0.24 ------ LVOT/AV Area, VTI 0.69 cm^2 ------ Area index 0.37 cm^2/m ------ (VTI)  ^2 Peak vel 0.23 ------ ratio, LVOT/AV Area, Vmax 0.65 cm^2 ------ Area index 0.35 cm^2/m ------ (Vmax) ^2 Mitral valve Peak E vel 95.3 cm/s ------ Peak A vel 108 cm/s ------ Decelerati 215 ms 150-23 on time 0 Peak 4 mm Hg ------ gradient, D Peak E/A 0.9 ------ ratio Tricuspid valve Regurg 293 cm/s ------ peak vel Peak RV-RA 34 mm Hg ------ gradient, S Systemic veins Estimated 5 mm Hg ------ CVP Right ventricle Pressure, 39 mm Hg <30 S Sa vel, 12.4 cm/s ------ lat ann, tiss DP    Assessment / Plan:   Critical aortic stenosis mild aortic insufficiency mild pulmonary hypertension with preserved left ventricular function, aortic valve with a maximum velocity of 460 cm/s peak pressure of 85 mean pressure of 49 Left Main: heavily calcified, 80% eccentric distal stenosis  Left ventricle: The cavity size was normal. Wall thickness was increased in a pattern of mild LVH. Systolic function was normal. The estimated ejection fraction was in the range of 60% to 65%. Wall motion was normal; there were no regional wall motion abnormalities. Doppler parameters are consistent with abnormal left ventricular relaxation (grade 1 diastolic dysfunction). Diabetes Mellitus Hypertension  Hypothyroidism  Hypercholesterolemia    I have spent more than 50 minutes face-to-face with the patient explaining the need for proceeding with aortic valve replacement and coronary artery bypass grafting because of the combination of critical and progressive aortic stenosis in the setting of 80% left main obstruction. The seriousness of both of these problems discussed and explained to the patient in detail. She's been reluctant in the past to consider surgery but with the current severity of the disease she is willing to proceed the risks and options have been discussed with her in detail. I suggested we proceed this week however she prefers to wait until Tuesday, February 25.      [name] MD  Beeper [phone] Office [phone] 06/17/2012 5:18 PM

## 2012-06-17 NOTE — Patient Instructions (Signed)
Stop metformin and benazepril  on feb 23 for surgery on feb 25 Tuesday   Aortic Stenosis Aortic stenosis, or aortic valve stenosis, is a narrowing of the aortic valve. When the aortic valve is narrowed, the valve does not open and close very well. This restricts blood flow between the left side of the heart and the aorta (the large artery which takes blood to the rest of the body). This restriction makes it hard for your heart to pump blood. This extra work can weaken your heart and can lead to heart failure. CAUSES  Causes of aortic valve stenosis can vary. Some of these can include:  Calcium deposits on the aortic valve. Calcium can buildup on the aortic valve and make it stiff. This cause of aortic stenosis is most common in people over the age of 47.  Congenital heart defect. This can occur during the development of the fetus and can result in an aortic valve defect.  Rheumatic fever. Rheumatic fever is a bacterial infection that can develop from a strep throat infection. The bacteria from rheumatic fever can attach themselves to the valve. This can cause scarring on the aortic valve, causing it to become narrow. SYMPTOMS  Symptoms of aortic valve stenosis develop when the valve disease is severe. Symptoms can include:  Shortness of breath, especially with physical activity.  Feeling tired (fatigue).  Chest pain (angina) or tightness.  Feeing your heart race or beat funny (heart palpitations).  Dizziness or fainting. DIAGNOSIS  Aortic stenosis is diagnosed through:  A physical exam and symptoms.  A heart murmur.  Echocardiography. This test uses sound waves to produce images of your heart. TREATMENT   Surgery is the treatment for aortic valve stenosis.  Surgery may not be needed right away. Surgery is necessary when narrowing of the aortic valve becomes severe, and symptoms develop or become worse.  Medications cannot reverse aortic valve stenosis. HOME CARE INSTRUCTIONS     If you have aortic stenosis, you many need to avoid strenuous physical activity. Talk with your caregiver about what types of activities you should avoid.  If you are a woman with aortic valve stenosis and are of child-bearing age, talk to your caregiver before you become pregnant.  If you become pregnant, you will need to be monitored by your obstetrician and cardiologist throughout your pregnancy, labor and delivery, and after delivery. SEEK IMMEDIATE MEDICAL CARE IF:  You develop chest pain or tightness.  You develop shortness of breath or difficulty breathing.  You develop lightheadedness or fainting.  You have heart palpitations or skipped heartbeats. Document Released: 01/14/2003 Document Revised: 07/10/2011 Document Reviewed: 08/24/2009 [facility] Patient Information 2013 [place], [place]. Aortic Valve Replacement You have a disease of one of the valves of your heart. In you or your child's case, it is the aortic valve which needs replacing. Aortic valve replacement is open heart surgery done by a heart surgeon. This operation treats problems with the aortic valve. The aortic valve is the "outflow valve" for the left side of the heart. The left side of your heart (left ventricle) is the large muscular part of the heart that pumps blood to the rest of the body. It separates the left ventricle from the aorta. When the heart squeezes down (contracts), the aortic valve is what keeps the blood from flowing back into the ventricle from the aorta. This allows the blood to keep moving through the body.  Surgery may be necessary when the valve does not open or close completely.  A stenotic (narrow) valve does not let the blood leave the heart normally. This causes blood to back up in the left ventricle. This makes it hard for the heart to increase the amount of blood that it pumps. The heart has to work harder. This may produce shortness of breath and fatigue. Problems are worse with activity.  If  the valve leaflets do not meet correctly when closing, blood may leak backward into the ventricle each time the heart pumps. This is called aortic insufficiency. When some of the blood leaks backwards, the heart has to work even harder. The heart can allow for this over-work for a long time if the leakage came on slowly. Eventually, the heart fails.  Aortic valve problems may be caused by a birth defect. This is called congenital. Wear and tear can cause valves to fail. More commonly, rheumatic fever may damage the aortic valve. Occasionally, the valve may be damaged by infection. This also causes the aortic valve to leak.  DESCRIPTION OF SURGERY Aortic valves can be repaired. When the valve is too damaged to repair, the valve must be replaced. A prosthetic (artificial) valve is used to do this. Valves damaged by rheumatic disease often must be replaced.  Two types of artificial valves are available:  Mechanical valves made entirely from man-made materials.  Biological valves which are made from animal tissues or taken from a cadaver. Each has advantages and disadvantages. The choice of which type to use should be made by you and your surgeon. Your risks, age, lifestyle, other medical problems including the decision on whether to be on blood thinners the rest of your life all will help you decide on which type of valve to use. There are a number of good MECHANICAL PROSTHESES available. All work well. The main advantage of mechanical valves is that they do not wear out. Their main disadvantage is that blood clots easier on mechanical valves. If this happens the valve will not work normally. Because of this, patients with mechanical valves must take anticoagulants (blood thinners) for life. There is also a small but definite risk of blood clots causing stroke, even when taking anticoagulants.  There are a number of BIOLOGICAL CHOICES for aortic valve replacement. Most are made from pig aortic valves. Some  are taken from cadavers. The main advantage is that they have a reduced risk of blood clots forming on the valve. This lessens the chance of the valve not working or causing a stroke. A large disadvantage of biological or tissue valves is that they wear out sooner than mechanical valves. The rate at which they wear out depends on the patient's age. A young boy might wear out such a valve in only a few years. The same valve might last 10 years in a middle aged person, and even longer in a patient over the age of 41. A tissue valve used in a person over 5 years old may never need replacement. RISKS AND COMPLICATIONS Your cardiologist and cardiothoracic surgeon can best determine your individual risk. It will depend on your age, general condition, medical conditions, and your heart function. In general, the risks include:  Problems from the operation itself are low risk. Some common risks are:  Risks from the anesthesia.  Bleeding and infection.  Lifelong treatment with medications to prevent blood clots is needed for mechanical valve replacements.  Infection is more common with valve replacement than with valve repair.  Valve failure is more common with valve replacement than with valve  repair. Pig valves tend to fail after about 8 to 10 years. PROCEDURE  Valve repair or replacement is open-heart surgery. You are given general anesthesia (medications to help you sleep). You are then placed on a heart-lung machine. This machine provides oxygen to your blood while the heart is not working. The surgery generally lasts from 3 to 5 hours. During surgery, the surgeon makes a large incision (cut) in the chest. Sometimes the heart is cooled to slow or stop the heartbeat. The damaged aortic valve is either repaired or removed and replaced with an artificial heart valve. AFTER THE PROCEDURE  Recovery from heart valve surgery usually involves a few days in an intensive care unit (ICU) of a hospital. Full  recovery from heart valve surgery can take several months.  Anticoagulation (blood thinning) treatment with warfarin is often prescribed for 6 weeks to 3 months after surgery for those with biological valves. It is prescribed for life for those with mechanical valves.  Recovery includes healing of the surgical incision. There is a gradual building of stamina and exercise abilities. An exercise program under the direction of a physical therapist may be recommended.  Once you have an artificial valve, your heart function and your life will return to normal. You usually feel better after surgery. Shortness of breath and fatigue should lessen. If your heart was already severely damaged before your surgery, you may continue to have problems.  You can usually resume most of your normal activities. You will have to continue to monitor your condition. You need to watch out for blood clots and infections.  Artificial valves need to be replaced after a period of time. It is important that you see your caregiver regularly.  Some individuals with an aortic valve replacement need to take antibiotics before having dental work or other surgical procedures. This is called prophylactic antibiotic treatment. These drugs help to prevent infective endocarditis. Antibiotics are only recommended for individuals with the highest risk for developing infective endocarditis. Let your dentist and your caregiver know if you have a history of any of the following so that the necessary precautions can be taken:  A VSD.  A repaired VSD.  Endocarditis in the past.  An artificial (prosthetic) heart valve. HOME CARE INSTRUCTIONS   Use all medications as prescribed.  Take your temperature every morning for the first week after surgery. Record these.  Weigh yourself every morning for at least the first week after surgery and record.  Do not lift more than 10 pounds (4.5 kg) until your sternum (breastbone) has healed. Avoid  all activities which would place strain on your incision.  You may shower but do not take baths until instructed by your caregivers.  Avoid driving for 4 to 6 weeks following surgery or as instructed.  Use your elastic stockings during the day. You should wear the stockings for at least 2 weeks after discharge or longer if your ankles are swollen. The stockings help blood flow and help reduce swelling in the legs. It is easiest to put the stockings on before you get out of bed in the morning. They should fit snugly. SEEK IMMEDIATE MEDICAL CARE IF:  You develop chest pain which is not coming from your incision (surgical cut) .  You develop shortness of breath.  You develop a temperature over 101 F (38.3 C).  You have a sudden weight gain. Let your caregiver know what the weight gain is. Document Released: 09/06/2004 Document Revised: 07/10/2011 Document Reviewed: 04/13/2008 [employer] Patient  Information 2013 [place], [place]. Aortic Valve Replacement Care After Read the instructions outlined below and refer to this sheet for the next few weeks. These discharge instructions provide you with general information on caring for yourself after you leave the hospital. Your surgeon may also give you specific instructions. While your treatment has been planned according to the most current medical practices available, unavoidable complications occasionally occur. If you have any problems or questions after discharge, please call your surgeon. AFTER THE PROCEDURE  Full recovery from heart valve surgery can take several months.  Blood thinning (anticoagulation) treatment with warfarin is often prescribed for 6 weeks to 3 months after surgery for those with biological valves. It is prescribed for life for those with mechanical valves.  Recovery includes healing of the surgical incision. There is a gradual building of stamina and exercise abilities. An exercise program under the direction of a physical  therapist may be recommended.  Once you have an artificial valve, your heart function and your life will return to normal. You usually feel better after surgery. Shortness of breath and fatigue should lessen. If your heart was already severely damaged before your surgery, you may continue to have problems.  You can usually resume most of your normal activities. You will have to continue to monitor your condition. You need to watch out for blood clots and infections.  Artificial valves need to be replaced after a period of time. It is important that you see your caregiver regularly.  Some individuals with an aortic valve replacement need to take antibiotics before having dental work or other surgical procedures. This is called prophylactic antibiotic treatment. These drugs help to prevent infective endocarditis. Antibiotics are only recommended for individuals with the highest risk for developing infective endocarditis. Let your dentist and your caregiver know if you have a history of any of the following so that the necessary precautions can be taken:  A VSD.  A repaired VSD.  Endocarditis in the past.  An artificial (prosthetic) heart valve. HOME CARE INSTRUCTIONS   Use all medications as prescribed.  Take your temperature every morning for the first week after surgery. Record these.  Weigh yourself every morning for at least the first week after surgery and record.  Do not lift more than 10 pounds (4.5 kg) until your breastbone (sternum) has healed. Avoid all activities which would place strain on your incision.  You may shower as soon as directed by your caregiver after surgery. Pat incisions dry. Do not rub incisions with washcloth or towel.  Avoid driving for 4 to 6 weeks following surgery or as instructed.  Use your elastic stockings during the day. You should wear the stockings for at least 2 weeks after discharge or longer if your ankles are swollen. The stockings help blood  flow and help reduce swelling in the legs. It is easiest to put the stockings on before you get out of bed in the morning. They should fit snugly. Pain Control  If a prescription was given for a pain reliever, please follow your doctor's directions.  If the pain is not relieved by your medicine, becomes worse, or you have difficulty breathing, call your surgeon. Activity  Take frequent rest periods throughout the day.  Wait one week before returning to strenuous activities such as heavy lifting (more than 10 pounds), pushing or pulling.  Talk with your doctor about when you may return to work and your exercise routine.  Do not drive while taking prescription pain medication. Nutrition  You may resume your normal diet.  Drink plenty of fluids (6-8 glasses a day).  Eat a well-balanced diet.  Call your caregiver for persistent nausea or vomiting. Elimination Your normal bowel function should return. If constipation should occur, you may:  Take a mild laxative.  Add fruit and bran to your diet.  Drink more fluids.  Call your doctor if constipation is not relieved. SEEK IMMEDIATE MEDICAL CARE IF:   You develop chest pain which is not coming from your surgical cut (incision).  You develop shortness of breath or have difficulty breathing.  You develop a temperature over 101 F (38.3 C).  You have a sudden weight gain. Let your caregiver know what the weight gain is.  You develop a rash.  You develop any reaction or side effects to medications given.  You have increased bleeding from wounds.  You see redness, swelling, or have increasing pain in wounds.  You have pus coming from your wound.  You develop lightheadedness or feel faint. Document Released: 11/03/2004 Document Revised: 07/10/2011 Document Reviewed: 01/25/2005 [facility] Patient Information 2013 [place], [place]. Coronary Artery Bypass Grafting Coronary artery bypass grafting (CABG) is done to bypass or fix  arteries of the heart (coronary) that have become narrow or blocked. This is usually the result of plaque built up in the walls of the vessels. The coronary arteries supply the heart with the oxygen and nutrients it needs to pump blood to your body. The heart never rests and needs constant blood flow. If an artery is partially blocked, you may have chest pain (angina). Lack of blood flow to part of the heart muscle may cause that part to die. This is what happens in a heart attack (myocardial infarction). Reasons for CABG include:  Arteries that cannot be treated with medications or other interventions (such as a heart stent).  Severe angina not responsive to other treatment.  Improving heart function.  Treating a heart attack. Every person is unique. Be sure you understand the risks and benefits of CABG.  RISKS AND COMPLICATIONS Your surgeon will discuss these with you. Your risks will be different depending on your past and present health and other factors. It may be helpful to have a family member or advocate with you so you feel free to ask questions and get the answers you need to give an informed consent. Possible problems of CABG surgery include:  Blood loss and replacement.  Stroke.  Infection.  Surgical site pain.  Heart attack during or after.  Kidney failure. BEFORE THE PROCEDURE  Tell your doctor about any allergies, medicines, bleeding problems and other surgeries.  Take all medicines exactly as directed. You may start new medicines and stop taking others. Do not stop medications or adjust dosages on your own. Continue taking the medications up until the time of surgery.  Perform only activities that are suggested by your caregiver.  If you are overweight, you should try to lose weight. Eat a heart-healthy diet that is low in fat and salt.  If you smoke, quit.  Let your caregiver know if you have been on steroids (including creams or drops) for long periods of  time. This is critical.  If you are diabetic discuss with your doctor whether or not you should take insulin the day of your surgery. DAY OF SURGERY:  Plan to arrive 60 minutes before the scheduled time or as directed.  Do not eat or drink anything, including medicine, unless directed.  You will sign a written consent.  You may have blood work and other tests done.  Your family will be shown where they can wait for the surgeon to talk to them when the surgery is over. PROCEDURE Only a specially trained surgeon does a CABG assisted by a team of other health care professionals. You will be asleep and not feel any discomfort during the surgery.   Traditional method:  A cut (incision) is made down the front of the chest through the breastbone (sternum).  The sternum is spread open so the surgeon can see your heart.  You are connected to a machine that does the work of your heart and lungs and your heart stops beating.  Veins are taken from your leg(s) and used to bypass the blocked arteries of your heart. Sometimes an artery from inside your chest wall is used, either by itself or along with leg veins.  When the bypasses are done, you are taken off the machine, and your heart is restarted and takes over again.  Alternate methods:  The heart lung machine is not used. This is called off-pump. Your heart continues to beat while the bypasses are done.  Smaller incisions are used instead of going through the middle of the chest (minimally invasive).  Intended to reduce pain and promote a faster recovery. AFTER THE PROCEDURE  You may wake up with a tube in your throat to help your breathing. You may be connected to a breathing machine.  You will not be able to talk while the tube is in place. Try not to fight against it. The tube will be taken out as soon as it is safe.  Even if you cannot see them, there are nurses nearby who are watching everything. You are not alone.  Your family  should be prepared to see you with many tubes and wires. You will be sleepy and pale. Your family can hold your hand and speak to you, but you may have no memory of this time. SEEK IMMEDIATE MEDICAL CARE IF:  You have severe chest pain, especially if the pain is crushing or pressure-like and spreads to the arms, back, neck, or jaw, or if you have sweating, feel sick to your stomach (nausea), or shortness of breath. THIS IS AN EMERGENCY. Do not wait to see if the pain will go away. Get medical help at once. Call your local emergency services (911 in [place] DO NOT drive yourself to the hospital.  You have an attack of chest pain lasting longer than usual, despite rest and treatment with the medications your doctor has prescribed.  You wake from sleep with chest pain.  You feel dizzy or faint. Document Released: 01/25/2005 Document Revised: 07/10/2011 Document Reviewed: 10/02/2007 [facility] Patient Information 2013 [place], [place].

## 2012-06-18 ENCOUNTER — Encounter: Payer: Medicare Other | Admitting: Thoracic Surgery (Cardiothoracic Vascular Surgery)

## 2012-06-19 ENCOUNTER — Encounter (HOSPITAL_COMMUNITY): Payer: Self-pay

## 2012-06-21 ENCOUNTER — Encounter (HOSPITAL_COMMUNITY)
Admission: RE | Admit: 2012-06-21 | Discharge: 2012-06-21 | Disposition: A | Discharge status: Home / Self Care | Payer: Medicare Other | Source: Ambulatory Visit | Attending: Cardiothoracic Surgery | Admitting: Cardiothoracic Surgery

## 2012-06-21 ENCOUNTER — Encounter (HOSPITAL_COMMUNITY): Payer: Self-pay

## 2012-06-21 ENCOUNTER — Telehealth: Payer: Self-pay | Admitting: *Deleted

## 2012-06-21 ENCOUNTER — Ambulatory Visit (HOSPITAL_COMMUNITY)
Admission: RE | Admit: 2012-06-21 | Discharge: 2012-06-21 | Disposition: A | Discharge status: Home / Self Care | Payer: Medicare Other | Source: Ambulatory Visit | Attending: Cardiothoracic Surgery | Admitting: Cardiothoracic Surgery

## 2012-06-21 VITALS — BP 130/74 | HR 60 | Temp 98.8°F | Resp 18 | Ht 61.0 in | Wt 182.5 lb

## 2012-06-21 DIAGNOSIS — I359 Nonrheumatic aortic valve disorder, unspecified: Secondary | ICD-10-CM | POA: Insufficient documentation

## 2012-06-21 DIAGNOSIS — Z01810 Encounter for preprocedural cardiovascular examination: Secondary | ICD-10-CM

## 2012-06-21 DIAGNOSIS — I2510 Atherosclerotic heart disease of native coronary artery without angina pectoris: Secondary | ICD-10-CM

## 2012-06-21 DIAGNOSIS — Z79899 Other long term (current) drug therapy: Secondary | ICD-10-CM | POA: Insufficient documentation

## 2012-06-21 HISTORY — DX: Unspecified asthma, uncomplicated: J45.909

## 2012-06-21 HISTORY — DX: Shortness of breath: R06.02

## 2012-06-21 HISTORY — DX: Hypothyroidism, unspecified: E03.9

## 2012-06-21 HISTORY — DX: Major depressive disorder, single episode, unspecified: F32.9

## 2012-06-21 HISTORY — DX: Depression, unspecified: F32.A

## 2012-06-21 HISTORY — DX: Cardiac murmur, unspecified: R01.1

## 2012-06-21 HISTORY — DX: Unspecified osteoarthritis, unspecified site: M19.90

## 2012-06-21 LAB — PROTIME-INR
INR: 0.94 (ref 0.00–1.49)
Prothrombin Time: 12.5 seconds (ref 11.6–15.2)

## 2012-06-21 LAB — BLOOD GAS, ARTERIAL
Acid-Base Excess: 1.6 mmol/L (ref 0.0–2.0)
Bicarbonate: 25.7 mEq/L — ABNORMAL HIGH (ref 20.0–24.0)
Drawn by: 344381
FIO2: 0.21 %
O2 Saturation: 96.5 %
Patient temperature: 98.6
TCO2: 26.9 mmol/L (ref 0–100)
pCO2 arterial: 40.7 mmHg (ref 35.0–45.0)
pH, Arterial: 7.416 (ref 7.350–7.450)
pO2, Arterial: 87.1 mmHg (ref 80.0–100.0)

## 2012-06-21 LAB — URINALYSIS, ROUTINE W REFLEX MICROSCOPIC
Bilirubin Urine: NEGATIVE
Glucose, UA: NEGATIVE mg/dL
Hgb urine dipstick: NEGATIVE
Ketones, ur: NEGATIVE mg/dL
Leukocytes, UA: NEGATIVE
Nitrite: NEGATIVE
Protein, ur: NEGATIVE mg/dL
Specific Gravity, Urine: 1.009 (ref 1.005–1.030)
Urobilinogen, UA: 0.2 mg/dL (ref 0.0–1.0)
pH: 6.5 (ref 5.0–8.0)

## 2012-06-21 LAB — HEMOGLOBIN A1C
Hgb A1c MFr Bld: 7.7 % — ABNORMAL HIGH (ref <5.7)
Mean Plasma Glucose: 174 mg/dL — ABNORMAL HIGH (ref <117)

## 2012-06-21 LAB — CBC
HCT: 31.5 % — ABNORMAL LOW (ref 36.0–46.0)
Hemoglobin: 10.8 g/dL — ABNORMAL LOW (ref 12.0–15.0)
MCH: 30.4 pg (ref 26.0–34.0)
MCHC: 34.3 g/dL (ref 30.0–36.0)
MCV: 88.7 fL (ref 78.0–100.0)
Platelets: 235 10*3/uL (ref 150–400)
RBC: 3.55 MIL/uL — ABNORMAL LOW (ref 3.87–5.11)
RDW: 13 % (ref 11.5–15.5)
WBC: 5.2 10*3/uL (ref 4.0–10.5)

## 2012-06-21 LAB — COMPREHENSIVE METABOLIC PANEL
ALT: 12 U/L (ref 0–35)
AST: 23 U/L (ref 0–37)
Albumin: 3.9 g/dL (ref 3.5–5.2)
Alkaline Phosphatase: 49 U/L (ref 39–117)
BUN: 14 mg/dL (ref 6–23)
CO2: 23 mEq/L (ref 19–32)
Calcium: 9.8 mg/dL (ref 8.4–10.5)
Chloride: 96 mEq/L (ref 96–112)
Creatinine, Ser: 0.66 mg/dL (ref 0.50–1.10)
GFR calc Af Amer: >90 mL/min (ref >90)
GFR calc non Af Amer: 83 mL/min — ABNORMAL LOW (ref >90)
Glucose, Bld: 78 mg/dL (ref 70–99)
Potassium: 4.2 mEq/L (ref 3.5–5.1)
Sodium: 132 mEq/L — ABNORMAL LOW (ref 135–145)
Total Bilirubin: 0.3 mg/dL (ref 0.3–1.2)
Total Protein: 8.4 g/dL — ABNORMAL HIGH (ref 6.0–8.3)

## 2012-06-21 LAB — ABO/RH: ABO/RH(D): A POS

## 2012-06-21 LAB — SURGICAL PCR SCREEN: MRSA, PCR: NEGATIVE

## 2012-06-21 LAB — APTT: aPTT: 29 seconds (ref 24–37)

## 2012-06-21 NOTE — Telephone Encounter (Signed)
Completed patients heart surgery education in short stay.  Book given.  Answered all questions.  Told to call me if any other concerns came up.

## 2012-06-21 NOTE — Progress Notes (Signed)
Pre-op Cardiac Surgery  Carotid Findings:  Bilateral:  No evidence of hemodynamically significant internal carotid artery stenosis.   Vertebral artery flow is antegrade.      Upper Extremity Right Left  Brachial Pressures 124 129  Radial Waveforms Tri Tri  Ulnar Waveforms Tri Tri  Palmar Arch (Allen's Test) Normal  Obliterates with radial compression, decreases >50% with ulnar compression    Palpable pedal pulses bilaterally.   Farrel Demark, RDMS, RVT 06/21/2012

## 2012-06-21 NOTE — Pre-Procedure Instructions (Signed)
[name]  06/21/2012   Your procedure is scheduled on:  Tuesday, February 25th  Report to [facility] at 0530 AM.  Call this number if you have problems the morning of surgery: [phone]   Remember:   Do not eat food or drink liquids after midnight.    Take these medicines the morning of surgery with A SIP OF WATER: coreg, synthroid   Do not wear jewelry, make-up or nail polish.  Do not wear lotions, powders, or perfumes,deodorant.  Do not shave 48 hours prior to surgery.  Do not bring valuables to the hospital.  Contacts, dentures or bridgework may not be worn into surgery.  Leave suitcase in the car. After surgery it may be brought to your room.  For patients admitted to the hospital, checkout time is 11:00 AM the day of discharge.   Patients discharged the day of surgery will not be allowed to drive home.  Special Instructions: Shower using CHG 2 nights before surgery and the night before surgery.  If you shower the day of surgery use CHG.  Use special wash - you have one bottle of CHG for all showers.  You should use approximately 1/3 of the bottle for each shower.   Please read over the following fact sheets that you were given: Pain Booklet, Coughing and Deep Breathing, Blood Transfusion Information, MRSA Information and Surgical Site Infection Prevention

## 2012-06-24 MED ORDER — DEXTROSE 5 % IV SOLN
750.0000 mg | INTRAVENOUS | Status: DC
  Filled 2012-06-24: qty 750

## 2012-06-24 MED ORDER — NITROGLYCERIN IN D5W 200-5 MCG/ML-% IV SOLN
2.0000 ug/min | INTRAVENOUS | Status: AC
  Administered 2012-06-25: 5 ug/min via INTRAVENOUS
  Filled 2012-06-24: qty 250

## 2012-06-24 MED ORDER — DEXMEDETOMIDINE HCL IN NACL 400 MCG/100ML IV SOLN
0.1000 ug/kg/h | INTRAVENOUS | Status: AC
  Administered 2012-06-25: .2 ug/kg/h via INTRAVENOUS
  Filled 2012-06-24: qty 100

## 2012-06-24 MED ORDER — POTASSIUM CHLORIDE 2 MEQ/ML IV SOLN
80.0000 meq | INTRAVENOUS | Status: DC
  Filled 2012-06-24: qty 40

## 2012-06-24 MED ORDER — PHENYLEPHRINE HCL 10 MG/ML IJ SOLN
30.0000 ug/min | INTRAVENOUS | Status: AC
  Administered 2012-06-25: 10 ug/min via INTRAVENOUS
  Filled 2012-06-24: qty 2

## 2012-06-24 MED ORDER — VERAPAMIL HCL 2.5 MG/ML IV SOLN
INTRAVENOUS | Status: AC
  Administered 2012-06-25: 09:00:00
  Filled 2012-06-24: qty 2.5

## 2012-06-24 MED ORDER — SODIUM CHLORIDE 0.9 % IV SOLN
INTRAVENOUS | Status: AC
  Administered 2012-06-25: 2.5 [IU]/h via INTRAVENOUS
  Filled 2012-06-24: qty 1

## 2012-06-24 MED ORDER — VANCOMYCIN HCL 10 G IV SOLR
1250.0000 mg | INTRAVENOUS | Status: AC
  Administered 2012-06-25: 1250 mg via INTRAVENOUS
  Filled 2012-06-24 (×2): qty 1250

## 2012-06-24 MED ORDER — DEXTROSE 5 % IV SOLN
1.5000 g | INTRAVENOUS | Status: AC
  Administered 2012-06-25: 25 g via INTRAVENOUS
  Administered 2012-06-25: 750 g via INTRAVENOUS
  Administered 2012-06-25: 1.5 g via INTRAVENOUS
  Filled 2012-06-24 (×2): qty 1.5

## 2012-06-24 MED ORDER — EPINEPHRINE HCL 1 MG/ML IJ SOLN
0.5000 ug/min | INTRAVENOUS | Status: DC
  Filled 2012-06-24: qty 4

## 2012-06-24 MED ORDER — DOPAMINE-DEXTROSE 3.2-5 MG/ML-% IV SOLN
2.0000 ug/kg/min | INTRAVENOUS | Status: AC
  Administered 2012-06-25: 3 ug/kg/min via INTRAVENOUS
  Filled 2012-06-24: qty 250

## 2012-06-24 MED ORDER — MAGNESIUM SULFATE 50 % IJ SOLN
40.0000 meq | INTRAMUSCULAR | Status: DC
  Filled 2012-06-24: qty 10

## 2012-06-24 MED ORDER — SODIUM CHLORIDE 0.9 % IV SOLN
INTRAVENOUS | Status: AC
  Administered 2012-06-25: 70 mL/h via INTRAVENOUS
  Filled 2012-06-24: qty 40

## 2012-06-25 ENCOUNTER — Encounter (HOSPITAL_COMMUNITY)
Admission: RE | Disposition: A | Discharge status: Skilled Nursing Facility | Payer: Self-pay | Source: Ambulatory Visit | Attending: Cardiothoracic Surgery

## 2012-06-25 ENCOUNTER — Encounter (HOSPITAL_COMMUNITY): Payer: Self-pay | Admitting: Anesthesiology

## 2012-06-25 ENCOUNTER — Inpatient Hospital Stay (HOSPITAL_COMMUNITY)
Admission: RE | Admit: 2012-06-25 | Discharge: 2012-07-03 | DRG: 220 | Disposition: A | Discharge status: Skilled Nursing Facility | Payer: Medicare Other | Source: Ambulatory Visit | Attending: Cardiothoracic Surgery | Admitting: Cardiothoracic Surgery

## 2012-06-25 ENCOUNTER — Inpatient Hospital Stay (HOSPITAL_COMMUNITY): Payer: Medicare Other

## 2012-06-25 ENCOUNTER — Inpatient Hospital Stay (HOSPITAL_COMMUNITY): Payer: Medicare Other | Admitting: Anesthesiology

## 2012-06-25 DIAGNOSIS — K5900 Constipation, unspecified: Secondary | ICD-10-CM | POA: Diagnosis not present

## 2012-06-25 DIAGNOSIS — E785 Hyperlipidemia, unspecified: Secondary | ICD-10-CM | POA: Diagnosis present

## 2012-06-25 DIAGNOSIS — F3289 Other specified depressive episodes: Secondary | ICD-10-CM | POA: Diagnosis present

## 2012-06-25 DIAGNOSIS — Z7982 Long term (current) use of aspirin: Secondary | ICD-10-CM

## 2012-06-25 DIAGNOSIS — I359 Nonrheumatic aortic valve disorder, unspecified: Principal | ICD-10-CM | POA: Diagnosis present

## 2012-06-25 DIAGNOSIS — Z79899 Other long term (current) drug therapy: Secondary | ICD-10-CM

## 2012-06-25 DIAGNOSIS — D62 Acute posthemorrhagic anemia: Secondary | ICD-10-CM | POA: Diagnosis not present

## 2012-06-25 DIAGNOSIS — E039 Hypothyroidism, unspecified: Secondary | ICD-10-CM | POA: Diagnosis present

## 2012-06-25 DIAGNOSIS — I2510 Atherosclerotic heart disease of native coronary artery without angina pectoris: Secondary | ICD-10-CM

## 2012-06-25 DIAGNOSIS — I350 Nonrheumatic aortic (valve) stenosis: Secondary | ICD-10-CM | POA: Diagnosis present

## 2012-06-25 DIAGNOSIS — I10 Essential (primary) hypertension: Secondary | ICD-10-CM | POA: Diagnosis present

## 2012-06-25 DIAGNOSIS — Z8249 Family history of ischemic heart disease and other diseases of the circulatory system: Secondary | ICD-10-CM

## 2012-06-25 DIAGNOSIS — Z794 Long term (current) use of insulin: Secondary | ICD-10-CM

## 2012-06-25 DIAGNOSIS — E1169 Type 2 diabetes mellitus with other specified complication: Secondary | ICD-10-CM | POA: Diagnosis not present

## 2012-06-25 DIAGNOSIS — Z952 Presence of prosthetic heart valve: Secondary | ICD-10-CM

## 2012-06-25 DIAGNOSIS — E119 Type 2 diabetes mellitus without complications: Secondary | ICD-10-CM | POA: Diagnosis present

## 2012-06-25 HISTORY — PX: CORONARY ARTERY BYPASS GRAFT: SHX141

## 2012-06-25 HISTORY — PX: AORTIC VALVE REPLACEMENT: SHX41

## 2012-06-25 HISTORY — PX: INTRAOPERATIVE TRANSESOPHAGEAL ECHOCARDIOGRAM: SHX5062

## 2012-06-25 LAB — POCT I-STAT 4, (NA,K, GLUC, HGB,HCT)
Glucose, Bld: 144 mg/dL — ABNORMAL HIGH (ref 70–99)
Glucose, Bld: 104 mg/dL — ABNORMAL HIGH (ref 70–99)
Glucose, Bld: 81 mg/dL (ref 70–99)
Glucose, Bld: 126 mg/dL — ABNORMAL HIGH (ref 70–99)
Glucose, Bld: 145 mg/dL — ABNORMAL HIGH (ref 70–99)
Glucose, Bld: 160 mg/dL — ABNORMAL HIGH (ref 70–99)
Glucose, Bld: 97 mg/dL (ref 70–99)
HCT: 28 % — ABNORMAL LOW (ref 36.0–46.0)
HCT: 20 % — ABNORMAL LOW (ref 36.0–46.0)
HCT: 24 % — ABNORMAL LOW (ref 36.0–46.0)
HCT: 26 % — ABNORMAL LOW (ref 36.0–46.0)
HCT: 25 % — ABNORMAL LOW (ref 36.0–46.0)
HCT: 22 % — ABNORMAL LOW (ref 36.0–46.0)
HCT: 26 % — ABNORMAL LOW (ref 36.0–46.0)
Hemoglobin: 9.5 g/dL — ABNORMAL LOW (ref 12.0–15.0)
Hemoglobin: 6.8 g/dL — CL (ref 12.0–15.0)
Hemoglobin: 8.2 g/dL — ABNORMAL LOW (ref 12.0–15.0)
Hemoglobin: 8.8 g/dL — ABNORMAL LOW (ref 12.0–15.0)
Hemoglobin: 8.5 g/dL — ABNORMAL LOW (ref 12.0–15.0)
Hemoglobin: 7.5 g/dL — ABNORMAL LOW (ref 12.0–15.0)
Hemoglobin: 8.8 g/dL — ABNORMAL LOW (ref 12.0–15.0)
Potassium: 3.8 mEq/L (ref 3.5–5.1)
Potassium: 3.4 mEq/L — ABNORMAL LOW (ref 3.5–5.1)
Potassium: 3.6 mEq/L (ref 3.5–5.1)
Potassium: 4.2 mEq/L (ref 3.5–5.1)
Potassium: 6 mEq/L — ABNORMAL HIGH (ref 3.5–5.1)
Potassium: 4.1 mEq/L (ref 3.5–5.1)
Potassium: 3.4 mEq/L — ABNORMAL LOW (ref 3.5–5.1)
Sodium: 138 mEq/L (ref 135–145)
Sodium: 137 mEq/L (ref 135–145)
Sodium: 138 mEq/L (ref 135–145)
Sodium: 137 mEq/L (ref 135–145)
Sodium: 130 mEq/L — ABNORMAL LOW (ref 135–145)
Sodium: 137 mEq/L (ref 135–145)
Sodium: 139 mEq/L (ref 135–145)

## 2012-06-25 LAB — POCT I-STAT, CHEM 8
BUN: 10 mg/dL (ref 6–23)
Calcium, Ion: 1.13 mmol/L (ref 1.13–1.30)
Chloride: 109 mEq/L (ref 96–112)
Creatinine, Ser: 0.7 mg/dL (ref 0.50–1.10)
Glucose, Bld: 116 mg/dL — ABNORMAL HIGH (ref 70–99)
HCT: 18 % — ABNORMAL LOW (ref 36.0–46.0)
Hemoglobin: 6.1 g/dL — CL (ref 12.0–15.0)
Potassium: 4.6 mEq/L (ref 3.5–5.1)
Sodium: 142 mEq/L (ref 135–145)
TCO2: 24 mmol/L (ref 0–100)

## 2012-06-25 LAB — POCT I-STAT 3, ART BLOOD GAS (G3+)
Acid-Base Excess: 5 mmol/L — ABNORMAL HIGH (ref 0.0–2.0)
Acid-Base Excess: 4 mmol/L — ABNORMAL HIGH (ref 0.0–2.0)
Acid-Base Excess: 1 mmol/L (ref 0.0–2.0)
Acid-base deficit: 1 mmol/L (ref 0.0–2.0)
Acid-base deficit: 3 mmol/L — ABNORMAL HIGH (ref 0.0–2.0)
Acid-base deficit: 2 mmol/L (ref 0.0–2.0)
Bicarbonate: 30.5 mEq/L — ABNORMAL HIGH (ref 20.0–24.0)
Bicarbonate: 27.7 mEq/L — ABNORMAL HIGH (ref 20.0–24.0)
Bicarbonate: 24.6 mEq/L — ABNORMAL HIGH (ref 20.0–24.0)
Bicarbonate: 23.9 mEq/L (ref 20.0–24.0)
Bicarbonate: 22.9 mEq/L (ref 20.0–24.0)
Bicarbonate: 23.6 mEq/L (ref 20.0–24.0)
O2 Saturation: 100 %
O2 Saturation: 100 %
O2 Saturation: 100 %
O2 Saturation: 98 %
O2 Saturation: 99 %
O2 Saturation: 99 %
Patient temperature: 34.6
Patient temperature: 36.2
Patient temperature: 36.1
TCO2: 32 mmol/L (ref 0–100)
TCO2: 29 mmol/L (ref 0–100)
TCO2: 26 mmol/L (ref 0–100)
TCO2: 25 mmol/L (ref 0–100)
TCO2: 24 mmol/L (ref 0–100)
TCO2: 25 mmol/L (ref 0–100)
pCO2 arterial: 46.3 mmHg — ABNORMAL HIGH (ref 35.0–45.0)
pCO2 arterial: 38.8 mmHg (ref 35.0–45.0)
pCO2 arterial: 33 mmHg — ABNORMAL LOW (ref 35.0–45.0)
pCO2 arterial: 35.1 mmHg (ref 35.0–45.0)
pCO2 arterial: 42.9 mmHg (ref 35.0–45.0)
pCO2 arterial: 44.5 mmHg (ref 35.0–45.0)
pH, Arterial: 7.428 (ref 7.350–7.450)
pH, Arterial: 7.461 — ABNORMAL HIGH (ref 7.350–7.450)
pH, Arterial: 7.481 — ABNORMAL HIGH (ref 7.350–7.450)
pH, Arterial: 7.432 (ref 7.350–7.450)
pH, Arterial: 7.332 — ABNORMAL LOW (ref 7.350–7.450)
pH, Arterial: 7.328 — ABNORMAL LOW (ref 7.350–7.450)
pO2, Arterial: 472 mmHg — ABNORMAL HIGH (ref 80.0–100.0)
pO2, Arterial: 289 mmHg — ABNORMAL HIGH (ref 80.0–100.0)
pO2, Arterial: 256 mmHg — ABNORMAL HIGH (ref 80.0–100.0)
pO2, Arterial: 90 mmHg (ref 80.0–100.0)
pO2, Arterial: 123 mmHg — ABNORMAL HIGH (ref 80.0–100.0)
pO2, Arterial: 166 mmHg — ABNORMAL HIGH (ref 80.0–100.0)

## 2012-06-25 LAB — CBC
HCT: 24.7 % — ABNORMAL LOW (ref 36.0–46.0)
HCT: 19.4 % — ABNORMAL LOW (ref 36.0–46.0)
Hemoglobin: 8.6 g/dL — ABNORMAL LOW (ref 12.0–15.0)
Hemoglobin: 7 g/dL — ABNORMAL LOW (ref 12.0–15.0)
MCH: 30 pg (ref 26.0–34.0)
MCH: 30.8 pg (ref 26.0–34.0)
MCHC: 34.8 g/dL (ref 30.0–36.0)
MCHC: 36.1 g/dL — ABNORMAL HIGH (ref 30.0–36.0)
MCV: 86.1 fL (ref 78.0–100.0)
MCV: 85.5 fL (ref 78.0–100.0)
Platelets: 89 10*3/uL — ABNORMAL LOW (ref 150–400)
Platelets: 73 10*3/uL — ABNORMAL LOW (ref 150–400)
RBC: 2.87 MIL/uL — ABNORMAL LOW (ref 3.87–5.11)
RBC: 2.27 MIL/uL — ABNORMAL LOW (ref 3.87–5.11)
RDW: 15.4 % (ref 11.5–15.5)
WBC: 7.5 10*3/uL (ref 4.0–10.5)

## 2012-06-25 LAB — PLATELET COUNT: Platelets: 135 10*3/uL — ABNORMAL LOW (ref 150–400)

## 2012-06-25 LAB — GLUCOSE, CAPILLARY: Glucose-Capillary: 121 mg/dL — ABNORMAL HIGH (ref 70–99)

## 2012-06-25 LAB — HEMOGLOBIN AND HEMATOCRIT, BLOOD
HCT: 23.4 % — ABNORMAL LOW (ref 36.0–46.0)
Hemoglobin: 8.6 g/dL — ABNORMAL LOW (ref 12.0–15.0)

## 2012-06-25 LAB — PROTIME-INR: INR: 1.57 — ABNORMAL HIGH (ref 0.00–1.49)

## 2012-06-25 LAB — CREATININE, SERUM
Creatinine, Ser: 0.65 mg/dL (ref 0.50–1.10)
GFR calc Af Amer: >90 mL/min (ref >90)
GFR calc non Af Amer: 83 mL/min — ABNORMAL LOW (ref >90)

## 2012-06-25 LAB — APTT: aPTT: 41 seconds — ABNORMAL HIGH (ref 24–37)

## 2012-06-25 LAB — MAGNESIUM: Magnesium: 3.3 mg/dL — ABNORMAL HIGH (ref 1.5–2.5)

## 2012-06-25 SURGERY — REPLACEMENT, AORTIC VALVE, OPEN
Anesthesia: General | Site: Chest | Wound class: Clean

## 2012-06-25 MED ORDER — SODIUM CHLORIDE 0.45 % IV SOLN
INTRAVENOUS | Status: DC
  Administered 2012-06-25 – 2012-06-27 (×2): via INTRAVENOUS

## 2012-06-25 MED ORDER — ROCURONIUM BROMIDE 100 MG/10ML IV SOLN
INTRAVENOUS | Status: DC | PRN
  Administered 2012-06-25: 100 mg via INTRAVENOUS

## 2012-06-25 MED ORDER — METOPROLOL TARTRATE 12.5 MG HALF TABLET: 12.5000 mg | ORAL_TABLET | Freq: Once | ORAL | Status: DC

## 2012-06-25 MED ORDER — DEXTROSE 5 % IV SOLN
1.5000 g | Freq: Two times a day (BID) | INTRAVENOUS | Status: AC
  Administered 2012-06-25 – 2012-06-27 (×4): 1.5 g via INTRAVENOUS
  Filled 2012-06-25 (×4): qty 1.5

## 2012-06-25 MED ORDER — ASPIRIN 81 MG PO CHEW: 324.0000 mg | CHEWABLE_TABLET | Freq: Every day | ORAL | Status: DC

## 2012-06-25 MED ORDER — NITROGLYCERIN IN D5W 200-5 MCG/ML-% IV SOLN: 0.0000 ug/min | INTRAVENOUS | Status: DC

## 2012-06-25 MED ORDER — LACTATED RINGERS IV SOLN
INTRAVENOUS | Status: DC | PRN
  Administered 2012-06-25 (×2): via INTRAVENOUS

## 2012-06-25 MED ORDER — VECURONIUM BROMIDE 10 MG IV SOLR
INTRAVENOUS | Status: DC | PRN
  Administered 2012-06-25 (×3): 5 mg via INTRAVENOUS

## 2012-06-25 MED ORDER — METOPROLOL TARTRATE 1 MG/ML IV SOLN: 2.5000 mg | INTRAVENOUS | Status: DC | PRN

## 2012-06-25 MED ORDER — THERA M PLUS PO TABS: 1.0000 | ORAL_TABLET | Freq: Every day | ORAL | Status: DC

## 2012-06-25 MED ORDER — SODIUM CHLORIDE 0.9 % IV SOLN: 250.0000 mL | INTRAVENOUS | Status: DC | PRN

## 2012-06-25 MED ORDER — ACETAMINOPHEN 10 MG/ML IV SOLN
1000.0000 mg | Freq: Once | INTRAVENOUS | Status: AC
  Administered 2012-06-25: 1000 mg via INTRAVENOUS
  Filled 2012-06-25: qty 100

## 2012-06-25 MED ORDER — PROPOFOL 10 MG/ML IV BOLUS
INTRAVENOUS | Status: DC | PRN
  Administered 2012-06-25: 120 mg via INTRAVENOUS

## 2012-06-25 MED ORDER — MORPHINE SULFATE 2 MG/ML IJ SOLN
1.0000 mg | INTRAMUSCULAR | Status: AC | PRN
Start: 2012-06-25 — End: 2012-06-26

## 2012-06-25 MED ORDER — DEXMEDETOMIDINE HCL IN NACL 200 MCG/50ML IV SOLN
0.1000 ug/kg/h | INTRAVENOUS | Status: DC
  Filled 2012-06-25: qty 50

## 2012-06-25 MED ORDER — LACTATED RINGERS IV SOLN
INTRAVENOUS | Status: DC | PRN
  Administered 2012-06-25 (×2): via INTRAVENOUS

## 2012-06-25 MED ORDER — ALBUMIN HUMAN 5 % IV SOLN
250.0000 mL | INTRAVENOUS | Status: AC | PRN
  Administered 2012-06-25 – 2012-06-26 (×4): 250 mL via INTRAVENOUS
  Filled 2012-06-25 (×2): qty 250

## 2012-06-25 MED ORDER — DOPAMINE-DEXTROSE 3.2-5 MG/ML-% IV SOLN: 3.0000 ug/kg/min | INTRAVENOUS | Status: DC

## 2012-06-25 MED ORDER — ADULT MULTIVITAMIN W/MINERALS CH
1.0000 | ORAL_TABLET | Freq: Every day | ORAL | Status: DC
  Administered 2012-06-26 – 2012-06-27 (×2): 1 via ORAL
  Filled 2012-06-25 (×3): qty 1

## 2012-06-25 MED ORDER — SODIUM CHLORIDE 0.9 % IJ SOLN
3.0000 mL | INTRAMUSCULAR | Status: DC | PRN
  Administered 2012-06-26: 3 mL via INTRAVENOUS

## 2012-06-25 MED ORDER — INSULIN REGULAR BOLUS VIA INFUSION
0.0000 [IU] | Freq: Three times a day (TID) | INTRAVENOUS | Status: DC
  Filled 2012-06-25: qty 10

## 2012-06-25 MED ORDER — DOCUSATE SODIUM 100 MG PO CAPS
200.0000 mg | ORAL_CAPSULE | Freq: Every day | ORAL | Status: DC
  Administered 2012-06-26 – 2012-06-27 (×2): 200 mg via ORAL
  Filled 2012-06-25 (×2): qty 2

## 2012-06-25 MED ORDER — ALBUMIN HUMAN 5 % IV SOLN
INTRAVENOUS | Status: DC | PRN
  Administered 2012-06-25 (×4): via INTRAVENOUS

## 2012-06-25 MED ORDER — FENTANYL CITRATE 0.05 MG/ML IJ SOLN
INTRAMUSCULAR | Status: DC | PRN
  Administered 2012-06-25 (×2): 250 ug via INTRAVENOUS
  Administered 2012-06-25: 50 ug via INTRAVENOUS
  Administered 2012-06-25: 125 ug via INTRAVENOUS
  Administered 2012-06-25: 300 ug via INTRAVENOUS
  Administered 2012-06-25: 125 ug via INTRAVENOUS
  Administered 2012-06-25: 50 ug via INTRAVENOUS
  Administered 2012-06-25 (×2): 250 ug via INTRAVENOUS
  Administered 2012-06-25: 100 ug via INTRAVENOUS
  Administered 2012-06-25: 250 ug via INTRAVENOUS

## 2012-06-25 MED ORDER — INSULIN ASPART 100 UNIT/ML ~~LOC~~ SOLN: 0.0000 [IU] | SUBCUTANEOUS | Status: DC

## 2012-06-25 MED ORDER — ASPIRIN EC 325 MG PO TBEC
325.0000 mg | DELAYED_RELEASE_TABLET | Freq: Every day | ORAL | Status: DC
  Administered 2012-06-26 – 2012-06-27 (×2): 325 mg via ORAL
  Filled 2012-06-25 (×3): qty 1

## 2012-06-25 MED ORDER — ATORVASTATIN CALCIUM 40 MG PO TABS
40.0000 mg | ORAL_TABLET | Freq: Every day | ORAL | Status: DC
  Administered 2012-06-26 – 2012-07-02 (×7): 40 mg via ORAL
  Filled 2012-06-25 (×9): qty 1

## 2012-06-25 MED ORDER — VANCOMYCIN HCL IN DEXTROSE 1-5 GM/200ML-% IV SOLN
1000.0000 mg | Freq: Once | INTRAVENOUS | Status: AC
  Administered 2012-06-25: 1000 mg via INTRAVENOUS
  Filled 2012-06-25: qty 200

## 2012-06-25 MED ORDER — HEPARIN SODIUM (PORCINE) 1000 UNIT/ML IJ SOLN
INTRAMUSCULAR | Status: DC | PRN
  Administered 2012-06-25: 21000 [IU] via INTRAVENOUS

## 2012-06-25 MED ORDER — CHLORHEXIDINE GLUCONATE 4 % EX LIQD: 30.0000 mL | CUTANEOUS | Status: DC

## 2012-06-25 MED ORDER — ACETAMINOPHEN 500 MG PO TABS
1000.0000 mg | ORAL_TABLET | Freq: Four times a day (QID) | ORAL | Status: DC
  Administered 2012-06-26 – 2012-06-28 (×10): 1000 mg via ORAL
  Filled 2012-06-25 (×14): qty 2

## 2012-06-25 MED ORDER — FAMOTIDINE IN NACL 20-0.9 MG/50ML-% IV SOLN
20.0000 mg | Freq: Two times a day (BID) | INTRAVENOUS | Status: DC
  Administered 2012-06-25: 20 mg via INTRAVENOUS

## 2012-06-25 MED ORDER — MORPHINE SULFATE 2 MG/ML IJ SOLN
2.0000 mg | INTRAMUSCULAR | Status: DC | PRN
  Administered 2012-06-26 – 2012-06-27 (×3): 2 mg via INTRAVENOUS
  Filled 2012-06-25 (×3): qty 1

## 2012-06-25 MED ORDER — OXYCODONE HCL 5 MG PO TABS
5.0000 mg | ORAL_TABLET | ORAL | Status: DC | PRN
  Administered 2012-06-26 – 2012-06-27 (×2): 5 mg via ORAL
  Filled 2012-06-25 (×2): qty 1

## 2012-06-25 MED ORDER — METOCLOPRAMIDE HCL 5 MG/ML IJ SOLN
10.0000 mg | Freq: Four times a day (QID) | INTRAMUSCULAR | Status: AC
  Administered 2012-06-25 – 2012-06-26 (×3): 10 mg via INTRAVENOUS
  Filled 2012-06-25 (×4): qty 2

## 2012-06-25 MED ORDER — POTASSIUM CHLORIDE 10 MEQ/50ML IV SOLN
10.0000 meq | INTRAVENOUS | Status: AC
  Administered 2012-06-25 (×3): 10 meq via INTRAVENOUS

## 2012-06-25 MED ORDER — SODIUM CHLORIDE 0.9 % IJ SOLN
3.0000 mL | Freq: Two times a day (BID) | INTRAMUSCULAR | Status: DC
  Administered 2012-06-26: 3 mL via INTRAVENOUS
  Administered 2012-06-26: 6 mL via INTRAVENOUS
  Administered 2012-06-27 – 2012-06-28 (×3): 3 mL via INTRAVENOUS

## 2012-06-25 MED ORDER — LACTATED RINGERS IV SOLN: 500.0000 mL | Freq: Once | INTRAVENOUS | Status: AC | PRN

## 2012-06-25 MED ORDER — PROTAMINE SULFATE 10 MG/ML IV SOLN
INTRAVENOUS | Status: DC | PRN
  Administered 2012-06-25: 25 mg via INTRAVENOUS
  Administered 2012-06-25 (×2): 50 mg via INTRAVENOUS
  Administered 2012-06-25: 70 mg via INTRAVENOUS

## 2012-06-25 MED ORDER — MIDAZOLAM HCL 2 MG/2ML IJ SOLN: 2.0000 mg | INTRAMUSCULAR | Status: DC | PRN

## 2012-06-25 MED ORDER — LACTATED RINGERS IV SOLN
INTRAVENOUS | Status: DC | PRN
  Administered 2012-06-25: 07:00:00 via INTRAVENOUS

## 2012-06-25 MED ORDER — MIDAZOLAM HCL 5 MG/5ML IJ SOLN
INTRAMUSCULAR | Status: DC | PRN
  Administered 2012-06-25: 1 mg via INTRAVENOUS
  Administered 2012-06-25: 2 mg via INTRAVENOUS
  Administered 2012-06-25: 4 mg via INTRAVENOUS
  Administered 2012-06-25: 1 mg via INTRAVENOUS
  Administered 2012-06-25: 2 mg via INTRAVENOUS
  Administered 2012-06-25: 4 mg via INTRAVENOUS

## 2012-06-25 MED ORDER — ONDANSETRON HCL 4 MG/2ML IJ SOLN
4.0000 mg | Freq: Four times a day (QID) | INTRAMUSCULAR | Status: DC | PRN
  Administered 2012-06-26: 4 mg via INTRAVENOUS
  Filled 2012-06-25: qty 2

## 2012-06-25 MED ORDER — LIDOCAINE HCL (CARDIAC) 20 MG/ML IV SOLN
INTRAVENOUS | Status: DC | PRN
  Administered 2012-06-25: 50 mg via INTRAVENOUS

## 2012-06-25 MED ORDER — MAGNESIUM SULFATE 40 MG/ML IJ SOLN
4.0000 g | Freq: Once | INTRAMUSCULAR | Status: AC
  Administered 2012-06-25: 4 g via INTRAVENOUS
  Filled 2012-06-25: qty 100

## 2012-06-25 MED ORDER — LACTATED RINGERS IV SOLN: INTRAVENOUS | Status: DC

## 2012-06-25 MED ORDER — METOPROLOL TARTRATE 25 MG/10 ML ORAL SUSPENSION
12.5000 mg | Freq: Two times a day (BID) | ORAL | Status: DC
  Filled 2012-06-25 (×5): qty 5

## 2012-06-25 MED ORDER — SODIUM CHLORIDE 0.9 % IJ SOLN
OROMUCOSAL | Status: DC | PRN
  Administered 2012-06-25 (×3): via TOPICAL

## 2012-06-25 MED ORDER — PHENYLEPHRINE HCL 10 MG/ML IJ SOLN
0.0000 ug/min | INTRAVENOUS | Status: DC
  Filled 2012-06-25: qty 2

## 2012-06-25 MED ORDER — LEVOTHYROXINE SODIUM 100 MCG PO TABS
100.0000 ug | ORAL_TABLET | Freq: Every day | ORAL | Status: DC
  Administered 2012-06-26 – 2012-07-03 (×8): 100 ug via ORAL
  Filled 2012-06-25 (×9): qty 1

## 2012-06-25 MED ORDER — SODIUM CHLORIDE 0.9 % IV SOLN
INTRAVENOUS | Status: DC
  Filled 2012-06-25: qty 1

## 2012-06-25 MED ORDER — PANTOPRAZOLE SODIUM 40 MG PO TBEC
40.0000 mg | DELAYED_RELEASE_TABLET | Freq: Every day | ORAL | Status: DC
  Administered 2012-06-27: 40 mg via ORAL
  Filled 2012-06-25: qty 1

## 2012-06-25 MED ORDER — SODIUM CHLORIDE 0.9 % IV SOLN
INTRAVENOUS | Status: DC | PRN
  Administered 2012-06-25: 13:00:00 via INTRAVENOUS

## 2012-06-25 MED ORDER — HEMOSTATIC AGENTS (NO CHARGE) OPTIME
TOPICAL | Status: DC | PRN
  Administered 2012-06-25: 1 via TOPICAL

## 2012-06-25 MED ORDER — METOPROLOL TARTRATE 12.5 MG HALF TABLET
12.5000 mg | ORAL_TABLET | Freq: Two times a day (BID) | ORAL | Status: DC
  Administered 2012-06-26 (×2): 12.5 mg via ORAL
  Filled 2012-06-25 (×5): qty 1

## 2012-06-25 MED ORDER — BISACODYL 5 MG PO TBEC
10.0000 mg | DELAYED_RELEASE_TABLET | Freq: Every day | ORAL | Status: DC
  Administered 2012-06-26 – 2012-06-27 (×2): 10 mg via ORAL
  Filled 2012-06-25 (×2): qty 2

## 2012-06-25 MED ORDER — ARTIFICIAL TEARS OP OINT
TOPICAL_OINTMENT | OPHTHALMIC | Status: DC | PRN
  Administered 2012-06-25: 1 via OPHTHALMIC

## 2012-06-25 MED ORDER — DEXTROSE 5 % IV SOLN
1.5000 g | Freq: Two times a day (BID) | INTRAVENOUS | Status: DC
  Filled 2012-06-25: qty 1.5

## 2012-06-25 MED ORDER — ACETAMINOPHEN 160 MG/5ML PO SOLN: 975.0000 mg | Freq: Four times a day (QID) | ORAL | Status: DC

## 2012-06-25 MED ORDER — SODIUM CHLORIDE 0.9 % IV SOLN
INTRAVENOUS | Status: DC
  Filled 2012-06-25: qty 40

## 2012-06-25 MED ORDER — SODIUM CHLORIDE 0.9 % IV SOLN
INTRAVENOUS | Status: DC
  Administered 2012-06-25: 15:00:00 via INTRAVENOUS

## 2012-06-25 MED ORDER — 0.9 % SODIUM CHLORIDE (POUR BTL) OPTIME
TOPICAL | Status: DC | PRN
  Administered 2012-06-25: 3000 mL

## 2012-06-25 MED ORDER — BISACODYL 10 MG RE SUPP: 10.0000 mg | Freq: Every day | RECTAL | Status: DC

## 2012-06-25 SURGICAL SUPPLY — 133 items
ADAPTER CARDIO PERF ANTE/RETRO (ADAPTER) ×3 IMPLANT
ADH SKN CLS APL DERMABOND .7 (GAUZE/BANDAGES/DRESSINGS) ×2
ADPR PRFSN 84XANTGRD RTRGD (ADAPTER) ×2
APPLICATOR COTTON TIP 6IN STRL (MISCELLANEOUS) IMPLANT
ATTRACTOMAT 16X20 MAGNETIC DRP (DRAPES) ×3 IMPLANT
BAG DECANTER FOR FLEXI CONT (MISCELLANEOUS) ×3 IMPLANT
BANDAGE ELASTIC 4 VELCRO ST LF (GAUZE/BANDAGES/DRESSINGS) ×1 IMPLANT
BANDAGE ELASTIC 6 VELCRO ST LF (GAUZE/BANDAGES/DRESSINGS) ×1 IMPLANT
BANDAGE GAUZE ELAST BULKY 4 IN (GAUZE/BANDAGES/DRESSINGS) ×1 IMPLANT
BLADE STERNUM SYSTEM 6 (BLADE) ×3 IMPLANT
BLADE SURG 11 STRL SS (BLADE) ×1 IMPLANT
BLADE SURG 15 STRL LF DISP TIS (BLADE) ×2 IMPLANT
BLADE SURG 15 STRL SS (BLADE) ×3
BLADE SURG ROTATE 9660 (MISCELLANEOUS) IMPLANT
BOOT SUTURE AID YELLOW STND (SUTURE) ×2 IMPLANT
CANISTER SUCTION 2500CC (MISCELLANEOUS) ×3 IMPLANT
CANN PRFSN .5XCNCT 15X34-48 (MISCELLANEOUS) ×2
CANNULA AORTIC HI-FLOW 6.5M20F (CANNULA) ×3 IMPLANT
CANNULA GUNDRY RCSP 15FR (MISCELLANEOUS) IMPLANT
CANNULA PRFSN .5XCNCT 15X34-48 (MISCELLANEOUS) ×2 IMPLANT
CANNULA VEN 2 STAGE (MISCELLANEOUS) ×3
CANNULA VENOUS DUAL 32/40 (CANNULA) IMPLANT
CATH CPB KIT GERHARDT (MISCELLANEOUS) ×3 IMPLANT
CATH HEART VENT LEFT (CATHETERS) ×2 IMPLANT
CATH RETROPLEGIA CORONARY 14FR (CATHETERS) ×4 IMPLANT
CATH THORACIC 28FR (CATHETERS) ×3 IMPLANT
CATH THORACIC 36FR (CATHETERS) IMPLANT
CATH THORACIC 36FR RT ANG (CATHETERS) IMPLANT
CATH/SQUID NICHOLS JEHLE COR (CATHETERS) IMPLANT
CLIP TI MEDIUM 24 (CLIP) IMPLANT
CLIP TI WIDE RED SMALL 24 (CLIP) IMPLANT
CLOTH BEACON ORANGE TIMEOUT ST (SAFETY) ×3 IMPLANT
COVER SURGICAL LIGHT HANDLE (MISCELLANEOUS) ×3 IMPLANT
CRADLE DONUT ADULT HEAD (MISCELLANEOUS) ×3 IMPLANT
DERMABOND ADVANCED (GAUZE/BANDAGES/DRESSINGS) ×1
DERMABOND ADVANCED .7 DNX12 (GAUZE/BANDAGES/DRESSINGS) IMPLANT
DRAIN CHANNEL 28F RND 3/8 FF (WOUND CARE) ×3 IMPLANT
DRAIN CHANNEL 32F RND 10.7 FF (WOUND CARE) IMPLANT
DRAPE CARDIOVASCULAR INCISE (DRAPES) ×3
DRAPE SLUSH MACHINE 52X66 (DRAPES) IMPLANT
DRAPE SLUSH/WARMER DISC (DRAPES) ×3 IMPLANT
DRAPE SRG 135X102X78XABS (DRAPES) ×2 IMPLANT
DRSG COVADERM 4X14 (GAUZE/BANDAGES/DRESSINGS) ×3 IMPLANT
ELECT BLADE 4.0 EZ CLEAN MEGAD (MISCELLANEOUS) ×3
ELECT CAUTERY BLADE 6.4 (BLADE) ×3 IMPLANT
ELECT REM PT RETURN 9FT ADLT (ELECTROSURGICAL) ×6
ELECTRODE BLDE 4.0 EZ CLN MEGD (MISCELLANEOUS) ×2 IMPLANT
ELECTRODE REM PT RTRN 9FT ADLT (ELECTROSURGICAL) ×4 IMPLANT
GLOVE BIO SURGEON STRL SZ 6 (GLOVE) ×2 IMPLANT
GLOVE BIO SURGEON STRL SZ 6.5 (GLOVE) ×11 IMPLANT
GLOVE BIO SURGEON STRL SZ7 (GLOVE) ×2 IMPLANT
GLOVE BIO SURGEON STRL SZ7.5 (GLOVE) ×1 IMPLANT
GLOVE BIOGEL PI IND STRL 6 (GLOVE) IMPLANT
GLOVE BIOGEL PI IND STRL 6.5 (GLOVE) IMPLANT
GLOVE BIOGEL PI IND STRL 7.0 (GLOVE) IMPLANT
GLOVE BIOGEL PI INDICATOR 6 (GLOVE) ×1
GLOVE BIOGEL PI INDICATOR 6.5 (GLOVE) ×1
GLOVE BIOGEL PI INDICATOR 7.0 (GLOVE)
GOWN STRL NON-REIN LRG LVL3 (GOWN DISPOSABLE) ×12 IMPLANT
HEMOSTAT POWDER SURGIFOAM 1G (HEMOSTASIS) ×9 IMPLANT
HEMOSTAT SURGICEL 2X14 (HEMOSTASIS) ×3 IMPLANT
INSERT FOGARTY 61MM (MISCELLANEOUS) IMPLANT
INSERT FOGARTY XLG (MISCELLANEOUS) IMPLANT
KIT BASIN OR (CUSTOM PROCEDURE TRAY) ×3 IMPLANT
KIT ROOM TURNOVER OR (KITS) ×3 IMPLANT
KIT SUCTION CATH 14FR (SUCTIONS) ×6 IMPLANT
KIT VASOVIEW W/TROCAR VH 2000 (KITS) ×3 IMPLANT
LEAD PACING MYOCARDI (MISCELLANEOUS) ×3 IMPLANT
LINE VENT (MISCELLANEOUS) ×1 IMPLANT
MARKER GRAFT CORONARY BYPASS (MISCELLANEOUS) ×9 IMPLANT
NDL 18GX1X1/2 (RX/OR ONLY) (NEEDLE) ×2 IMPLANT
NEEDLE 18GX1X1/2 (RX/OR ONLY) (NEEDLE) IMPLANT
NS IRRIG 1000ML POUR BTL (IV SOLUTION) ×16 IMPLANT
PACK OPEN HEART (CUSTOM PROCEDURE TRAY) ×3 IMPLANT
PAD ARMBOARD 7.5X6 YLW CONV (MISCELLANEOUS) ×6 IMPLANT
PENCIL BUTTON HOLSTER BLD 10FT (ELECTRODE) ×3 IMPLANT
PUNCH AORTIC ROTATE 4.0MM (MISCELLANEOUS) IMPLANT
PUNCH AORTIC ROTATE 4.5MM 8IN (MISCELLANEOUS) IMPLANT
PUNCH AORTIC ROTATE 5MM 8IN (MISCELLANEOUS) IMPLANT
SET CARDIOPLEGIA MPS 5001102 (MISCELLANEOUS) ×1 IMPLANT
SOLUTION ANTI FOG 6CC (MISCELLANEOUS) IMPLANT
SPONGE GAUZE 4X4 12PLY (GAUZE/BANDAGES/DRESSINGS) ×5 IMPLANT
SPONGE LAP 18X18 X RAY DECT (DISPOSABLE) IMPLANT
SPONGE LAP 4X18 X RAY DECT (DISPOSABLE) IMPLANT
SURGIFLO TRUKIT (HEMOSTASIS) ×1 IMPLANT
SUT BONE WAX W31G (SUTURE) ×3 IMPLANT
SUT ETHIBON 2 0 V 52N 30 (SUTURE) ×6 IMPLANT
SUT MNCRL AB 4-0 PS2 18 (SUTURE) ×1 IMPLANT
SUT PROLENE 3 0 RB 1 (SUTURE) ×1 IMPLANT
SUT PROLENE 3 0 SH 1 (SUTURE) ×3 IMPLANT
SUT PROLENE 3 0 SH DA (SUTURE) ×4 IMPLANT
SUT PROLENE 3 0 SH1 36 (SUTURE) ×3 IMPLANT
SUT PROLENE 4 0 RB 1 (SUTURE) ×9
SUT PROLENE 4 0 SH DA (SUTURE) IMPLANT
SUT PROLENE 4 0 TF (SUTURE) ×6 IMPLANT
SUT PROLENE 4-0 RB1 .5 CRCL 36 (SUTURE) ×2 IMPLANT
SUT PROLENE 5 0 C 1 36 (SUTURE) IMPLANT
SUT PROLENE 6 0 C 1 30 (SUTURE) IMPLANT
SUT PROLENE 6 0 CC (SUTURE) ×6 IMPLANT
SUT PROLENE 7 0 BV 1 (SUTURE) IMPLANT
SUT PROLENE 7 0 BV1 MDA (SUTURE) ×3 IMPLANT
SUT PROLENE 7.0 RB 3 (SUTURE) IMPLANT
SUT PROLENE 8 0 BV175 6 (SUTURE) IMPLANT
SUT SILK  1 MH (SUTURE)
SUT SILK 1 MH (SUTURE) IMPLANT
SUT SILK 2 0 SH CR/8 (SUTURE) IMPLANT
SUT SILK 3 0 SH CR/8 (SUTURE) IMPLANT
SUT STEEL 6MS V (SUTURE) ×3 IMPLANT
SUT STEEL STERNAL CCS#1 18IN (SUTURE) IMPLANT
SUT STEEL SZ 6 DBL 3X14 BALL (SUTURE) ×3 IMPLANT
SUT VIC AB 1 CTX 18 (SUTURE) ×6 IMPLANT
SUT VIC AB 1 CTX 36 (SUTURE)
SUT VIC AB 1 CTX36XBRD ANBCTR (SUTURE) IMPLANT
SUT VIC AB 2-0 CT1 27 (SUTURE) ×6
SUT VIC AB 2-0 CT1 TAPERPNT 27 (SUTURE) IMPLANT
SUT VIC AB 2-0 CTX 27 (SUTURE) IMPLANT
SUT VIC AB 3-0 SH 27 (SUTURE)
SUT VIC AB 3-0 SH 27X BRD (SUTURE) IMPLANT
SUT VIC AB 3-0 X1 27 (SUTURE) IMPLANT
SUT VICRYL 4-0 PS2 18IN ABS (SUTURE) IMPLANT
SUTURE E-PAK OPEN HEART (SUTURE) ×3 IMPLANT
SYSTEM SAHARA CHEST DRAIN ATS (WOUND CARE) ×3 IMPLANT
TAPE CLOTH SURG 4X10 WHT LF (GAUZE/BANDAGES/DRESSINGS) ×1 IMPLANT
TOWEL OR 17X24 6PK STRL BLUE (TOWEL DISPOSABLE) ×6 IMPLANT
TOWEL OR 17X26 10 PK STRL BLUE (TOWEL DISPOSABLE) ×6 IMPLANT
TRAY FOLEY IC TEMP SENS 14FR (CATHETERS) ×3 IMPLANT
TUBE FEEDING 8FR 16IN STR KANG (MISCELLANEOUS) ×3 IMPLANT
TUBE SUCT INTRACARD DLP 20F (MISCELLANEOUS) ×3 IMPLANT
TUBING INSUFFLATION 10FT LAP (TUBING) ×3 IMPLANT
UNDERPAD 30X30 INCONTINENT (UNDERPADS AND DIAPERS) ×3 IMPLANT
VALVE MAGNA EASE 21MM (Prosthesis & Implant Heart) ×1 IMPLANT
VENT LEFT HEART 12002 (CATHETERS) ×3
WATER STERILE IRR 1000ML POUR (IV SOLUTION) ×6 IMPLANT

## 2012-06-25 NOTE — H&P (Signed)
[address]       [place] [address]             [phone]        [name] Wilder Medical Record #[id number] Date of Birth: 12-30-1932  Referring: Nahser, Deloris Ping, MD Primary Care: Willey Blade, MD Endo; Dr Lucianne Muss   Chief Complaint:    SOB   History of Present Illness:    Patient is a 79 year old diabetic female with known aortic stenosis. She is noted increasing shortness of breath with minimal exertion over the past one to 2 years. More recently it has involved chest discomfort radiating to the neck associated with shortness of breath. She denies syncope or presyncope. She notes mild pedal edema. She denies shortness of breath at rest but climbing as few as 12 stairs requires her to stop.  Echocardiogram in 07-Oct-2009 revealed aortic valve area of 1.03 peak gradient of 41 mm mean gradient 25 maximum velocity 322 cm/s. In 10-08-11 the aortic valve gradient dropped to 0.76 and the peak velocity was 410 cm/s with a mean gradient of 44 and a peak gradient of 67. In 2012/10/07 the peak gradient had increased to 85 mean gradient 49 with a velocity across the aortic valve of 460 cm/s. LV function has been preserved by echocardiogram ventriculogram has not been done.      Current Activity/ Functional Status:  Patient is independent with mobility/ambulation, transfers, ADL's, IADL's.  Zubrod Score: At the time of surgery this patient's most appropriate activity status/level should be described as: []  Normal activity, no symptoms [x]  Symptoms, fully ambulatory []  Symptoms, in bed less than or equal to 50% of the time []  Symptoms, in bed greater than 50% of the time but less than 100% []  Bedridden []  Moribund   Past Medical History  Diagnosis Date  . Thyroid disease   . Diabetes mellitus   . Lumbago   . Aortic valve disorders   . Hyperlipidemia   . Heart murmur   . Hypertension     dr. Melburn Popper   . Hypothyroidism   . Depression   . Shortness  of breath   . Asthma     AS CHILD   . Arthritis     Past Surgical History  Procedure Laterality Date  . Tubal ligation    . Breast surgery      LT BREAST BX BENIGN    Family History  Problem Relation Age of Onset  . Heart disease Father     History   Social History  . Marital Status: Single    Spouse Name: N/A    Number of Children: N/A  . Years of Education: N/A   Occupational History  . Not on file.   Social History Main Topics  . Smoking status: Never Smoker   . Smokeless tobacco: Not on file  . Alcohol Use: Not on file  . Drug Use: Not on file  . Sexually Active: Not on file   Other Topics Concern  . retired   Social History Narrative  Lives with her sister, Husband had bypass surgery in 1994/10/08, died 10/07/2001     History  Smoking status  . Never Smoker   Smokeless tobacco  . Not on file    History  Alcohol Use No     No Known Allergies  Current Facility-Administered Medications  Medication Dose Route  Frequency Provider Last Rate Last Dose  . aminocaproic acid (AMICAR) 10 g in sodium chloride 0.9 % 100 mL infusion   Intravenous To OR [name], MD      . cefUROXime (ZINACEF) 1.5 g in dextrose 5 % 50 mL IVPB  1.5 g Intravenous To OR [name], MD      . cefUROXime (ZINACEF) 750 mg in dextrose 5 % 50 mL IVPB  750 mg Intravenous To OR [name], MD      . chlorhexidine (HIBICLENS) 4 % liquid 2 application  30 mL Topical UD [name], MD      . dexmedetomidine (PRECEDEX) 400 MCG/100ML infusion  0.1-0.7 mcg/kg/hr Intravenous To OR [name], MD      . DOPamine (INTROPIN) 800 mg in dextrose 5 % 250 mL infusion  2-20 mcg/kg/min Intravenous To OR [name], MD      . EPINEPHrine (ADRENALIN) 4,000 mcg in dextrose 5 % 250 mL infusion  0.5-20 mcg/min Intravenous To OR [name], MD      . insulin regular (NOVOLIN R,HUMULIN R) 1 Units/mL in sodium chloride 0.9 % 100 mL infusion   Intravenous To OR [name], MD      . magnesium sulfate (IV Push/IM) injection 40 mEq  40 mEq Other To OR [name], MD      . metoprolol tartrate (LOPRESSOR) tablet 12.5 mg  12.5 mg Oral Once [name], MD      . nitroGLYCERIN 0.2 mg/mL in dextrose 5 % infusion  2-200 mcg/min Intravenous To OR [name], MD      . nitroglycerin-verapamil-HEPARIN-sodium bicarbonate irrigation for artery spasm   Irrigation To OR [name], MD      . phenylephrine (NEO-SYNEPHRINE) 20,000 mcg in dextrose 5 % 250 mL infusion  30-200 mcg/min Intravenous To OR [name], MD      . potassium chloride injection 80 mEq  80 mEq Other To OR [name], MD       Facility-Administered Medications Ordered in Other Encounters  Medication Dose Route Frequency Provider Last Rate Last Dose  . fentaNYL (SUBLIMAZE) injection    PRN Carmela Rima, CRNA   50 mcg at 06/25/12 7846  . lactated ringers infusion    Continuous PRN Carmela Rima, CRNA      . lactated ringers infusion    Continuous PRN Carmela Rima, CRNA      . lactated ringers infusion    Continuous PRN Carmela Rima, CRNA      . midazolam (VERSED) 5 MG/5ML injection    PRN Carmela Rima, CRNA   1 mg at 06/25/12 9629       Review of Systems:     Cardiac Review of Systems: Y or N  Chest Pain [  y  ]  Resting SOB [ n  ] Exertional SOB  [ y ]  Myer Peer  ]   Pedal Edema [  y ]    Palpitations [ n ] Syncope  [n  ]   Presyncope [ n  ]  General Review of Systems: [Y] = yes [  ]=no Constitional: recent weight change [  ]; anorexia [  ]; fatigue [  ]; nausea [  ]; night sweats [  ]; fever [  ]; or chills [  ];  Dental: poor dentition[ dentures ]; Last Dentist visit:   Eye : blurred vision [  ]; diplopia [   ]; vision changes [  ];  Amaurosis fugax[  ]; Resp: cough [  ];  wheezing[  ];   hemoptysis[  ]; shortness of breath[  ]; paroxysmal nocturnal dyspnea[  ]; dyspnea on exertion[  ]; or orthopnea[  ];  GI:  gallstones[  ], vomiting[  ];  dysphagia[  ]; melena[  ];  hematochezia [  ]; heartburn[  ];   Hx of  Colonoscopy[ y ]; GU: kidney stones [  ]; hematuria[  ];   dysuria [  ];  nocturia[  ];  history of     obstruction [  ]; urinary frequency [ y ]             Skin: rash, swelling[  ];, hair loss[  ];  peripheral edema[  ];  or itching[  ]; Musculosketetal: myalgias[  ];  joint swelling[  ];  joint erythema[  ];  joint pain[  ];  back pain[  ];  Heme/Lymph: bruising[  ];  bleeding[  ];  anemia[  ];  Neuro: TIA[ n ];  headaches[  ];  stroke[  ];  vertigo[  ];  seizures[ n ];   paresthesias[  ];  difficulty walking[ n ];  Psych:depression[  ]; anxiety[y  ];  Endocrine: diabetes[y  ];  thyroid dysfunction[ y ];  Immunizations: Flu [place].Etienne  ]; Pneumococcal[ y ];  Other:  Physical Exam: BP 171/76  Pulse 64  Temp(Src) 98.1 F (36.7 C) (Oral)  Resp 18  Wt 182 lb (82.555 kg)  BMI 34.41 kg/m2  SpO2 100%  General appearance: alert, cooperative and no distress Neurologic: intact Heart: systolic murmur: holosystolic 4/6, grunting and harsh throughout the precordium and diastolic murmur: early diastolic 2/6, blowing at 2nd right intercostal space Lungs: clear to auscultation bilaterally and normal percussion bilaterally Abdomen: soft, non-tender; bowel sounds normal; no masses,  no organomegaly Extremities: extremities normal, atraumatic, no cyanosis or edema and Homans sign is negative, no sign of DVT Patient has murmur referred to the carotids, has no cervical or supraclavicular adenopathy, he has palpable DP and PT pulses bilaterally   Diagnostic Studies & Laboratory data:     Recent Radiology Findings:  Dg Chest 2 View  06/21/2012  *RADIOLOGY REPORT*  Clinical Data: Preop for cardiac surgery.  CHEST - 2 VIEW  Comparison: 05/24/2008  Findings: Lateral view degraded by  patient arm position.  Midline trachea.  Borderline cardiomegaly.  Atherosclerosis in the transverse aorta. No pleural effusion or pneumothorax.  Mild bibasilar volume loss with vascular crowding, primarily on the left. No congestive failure.  IMPRESSION: Similar borderline cardiomegaly and low lung volumes. No acute findings.   Original Report Authenticated By: Jeronimo Greaves, M.D.    Recent Lab Findings: Lab Results  Component Value Date   WBC 5.2 06/21/2012   HGB 10.8* 06/21/2012   HCT 31.5* 06/21/2012   PLT 235 06/21/2012   GLUCOSE 78 06/21/2012   ALT 12 06/21/2012   AST 23 06/21/2012   NA 132* 06/21/2012   K 4.2 06/21/2012   CL 96 06/21/2012   CREATININE 0.66 06/21/2012   BUN 14 06/21/2012   CO2 23 06/21/2012   INR 0.94 06/21/2012   HGBA1C 7.7* 06/21/2012   CATH: The right femoral artery and right femoral vein were easily canulated using a modified Seldinger technique.  Hemodynamics:  RA: 9/2/2  RV: 31/2  PCWP: 7/4  PA: 31/6  Cardiac Output  Thermodilution: 3.2 Inded = 1.7  Fick : 5.6 Index = 3.0  Arterial Sat: 91 %  PA Sat: 61%.  LV pressure: Not obtained  Aortic pressure: 126/48  Angiography  Aortic arch : very heavily calcified  Left Main: heavily calcified, 80% eccentric distal stenosis  Left anterior Descending: heavily calcified proximal, Mild - moderate irregularities . diags are small  Left Circumflex: moderate sized vessel.  Right Coronary Artery: large, dominant, heavily calcified. There is a 30% mid stenosis. PDA and PLSA are unremarkable  LV Gram: not performed.  Complications: No apparent complications  Patient did tolerate procedure well.  Contrast used: 60 cc  Conclusions:  1. Significant stenosis in the distal LM  2. Severe Aortic stenosis ( mean gradient of 50 by echo)  Will refer for AVR and CABG.  Vesta Mixer, Montez Hageman., MD, [facility]  06/04/2012, 10:21 AM  ECHO: LV EF: 60% - 65%  ------------------------------------------------------------ Indications: Aortic  stenosis 424.1.  ------------------------------------------------------------ History: PMH: Acquired from the patient and from the patient's chart. 3/6 Systolic murmur. Borderline significant aortic stenosis. Risk factors: Hypertension. Diabetes mellitus. Obese. Dyslipidemia.  ------------------------------------------------------------ Study Conclusions  - Left ventricle: The cavity size was normal. Wall thickness was increased in a pattern of mild LVH. Systolic function was normal. The estimated ejection fraction was in the range of 60% to 65%. Wall motion was normal; there were no regional wall motion abnormalities. Doppler parameters are consistent with abnormal left ventricular relaxation (grade 1 diastolic dysfunction). - Aortic valve: There was severe stenosis. Mean gradient: 49mm Hg (S). Peak gradient: 85mm Hg (S). - Mitral valve: Mildly calcified annulus. Normal thickness leaflets . Mild regurgitation. - Left atrium: The atrium was mildly dilated. - Pulmonary arteries: Systolic pressure was mildly increased. PA peak pressure: 39mm Hg (S).  ------------------------------------------------------------ Labs, prior tests, procedures, and surgery: Echocardiography (February 2013). The aortic valve showed moderate to severe stenosis. EF was 60% and PA pressure was 39 (systolic). Aortic valve: peak gradient of 67mm Hg and mean gradient of 44mm Hg.  Transthoracic echocardiography. M-mode, complete 2D, spectral Doppler, and color Doppler. Height: Height: 160cm. Height: 63in. Weight: Weight: 83.9kg. Weight: 184.6lb. Body mass index: BMI: 32.8kg/m^2. Body surface area: BSA: 1.13m^2. Blood pressure: 130/70. Patient status: Outpatient. Location: [facility]  ------------------------------------------------------------  ------------------------------------------------------------ Left ventricle: The cavity size was normal. Wall thickness was increased in a pattern of mild  LVH. Systolic function was normal. The estimated ejection fraction was in the range of 60% to 65%. Wall motion was normal; there were no regional wall motion abnormalities. Doppler parameters are consistent with abnormal left ventricular relaxation (grade 1 diastolic dysfunction).  ------------------------------------------------------------ Aortic valve: Trileaflet; moderately calcified leaflets. Doppler: There was severe stenosis. No significant regurgitation. VTI ratio of LVOT to aortic valve: 0.24. Valve area: 0.69cm^2(VTI). Indexed valve area: 0.37cm^2/m^2 (VTI). Peak velocity ratio of LVOT to aortic valve: 0.23. Valve area: 0.65cm^2 (Vmax). Indexed valve area: 0.35cm^2/m^2 (Vmax). Mean gradient: 49mm Hg (S). Peak gradient: 85mm Hg (S).  ------------------------------------------------------------ Aorta: The aorta was normal, not dilated, and non-diseased.  ------------------------------------------------------------ Mitral valve: Mildly calcified annulus. Normal thickness leaflets . Doppler: There was no evidence for stenosis. Mild regurgitation. Peak gradient: 4mm Hg (D).  ------------------------------------------------------------ Left atrium: The atrium was mildly dilated.  ------------------------------------------------------------ Right ventricle: The cavity size was normal. Wall thickness was normal. Systolic function was normal.  ------------------------------------------------------------ Pulmonic valve: Structurally normal valve. Cusp separation was normal. Doppler: Transvalvular velocity was within the normal range. No regurgitation.  ------------------------------------------------------------  Tricuspid valve: Structurally normal valve. Leaflet separation was normal. Doppler: Transvalvular velocity was within the normal range. Mild regurgitation.  ------------------------------------------------------------ Pulmonary artery: The main pulmonary artery  was normal-sized. Systolic pressure was mildly increased.  ------------------------------------------------------------ Right atrium: The atrium was normal in size.  ------------------------------------------------------------ Pericardium: The pericardium was normal in appearance.  ------------------------------------------------------------ Systemic veins: Inferior vena cava: The vessel was normal in size; the respirophasic diameter changes were in the normal range (= 50%); findings are consistent with normal central venous pressure.  ------------------------------------------------------------ Post procedure conclusions Ascending Aorta:  - The aorta was normal, not dilated, and non-diseased.  ------------------------------------------------------------  2D measurements Normal Doppler measurements Normal Left ventricle Main pulmonary LVID ED, 43.6 mm 43-52 artery chord, Pressure, 39 mm Hg =30 PLAX S LVID ES, 31.2 mm 23-38 Left ventricle chord, Ea, lat 7.02 cm/s ------ PLAX ann, tiss FS, chord, 28 % >29 DP PLAX E/Ea, lat 13.5 ------ LVPW, ED 10.4 mm ------ ann, tiss 8 IVS/LVPW 1.13 <1.3 DP ratio, ED Ea, med 5.92 cm/s ------ Ventricular septum ann, tiss IVS, ED 11.7 mm ------ DP LVOT E/Ea, med 16.1 ------ Diam, S 19 mm ------ ann, tiss Area 2.84 cm^2 ------ DP Diam 19 mm ------ LVOT Aorta Peak vel, 105 cm/s ------ Root diam, 25 mm ------ S ED VTI, S 27.3 cm ------ Left atrium Stroke vol 77.4 ml ------ AP dim 44 mm ------ Stroke 41.4 ml/m^2 ------ AP dim 2.35 cm/m^2 <2.2 index index Aortic valve Peak vel, 460 cm/s ------ S Mean vel, 329 cm/s ------ S VTI, S 113 cm ------ Mean 49 mm Hg ------ gradient, S Peak 85 mm Hg ------ gradient, S VTI ratio 0.24 ------ LVOT/AV Area, VTI 0.69 cm^2 ------ Area index 0.37 cm^2/m ------ (VTI) ^2 Peak vel 0.23 ------ ratio, LVOT/AV Area, Vmax 0.65 cm^2 ------ Area index 0.35 cm^2/m ------ (Vmax) ^2 Mitral  valve Peak E vel 95.3 cm/s ------ Peak A vel 108 cm/s ------ Decelerati 215 ms 150-23 on time 0 Peak 4 mm Hg ------ gradient, D Peak E/A 0.9 ------ ratio Tricuspid valve Regurg 293 cm/s ------ peak vel Peak RV-RA 34 mm Hg ------ gradient, S Systemic veins Estimated 5 mm Hg ------ CVP Right ventricle Pressure, 39 mm Hg <30 S Sa vel, 12.4 cm/s ------ lat ann, tiss DP    Assessment / Plan:   Critical aortic stenosis mild aortic insufficiency mild pulmonary hypertension with preserved left ventricular function, aortic valve with a maximum velocity of 460 cm/s peak pressure of 85 mean pressure of 49 Left Main: heavily calcified, 80% eccentric distal stenosis  Left ventricle: The cavity size was normal. Wall thickness was increased in a pattern of mild LVH. Systolic function was normal. The estimated ejection fraction was in the range of 60% to 65%. Wall motion was normal; there were no regional wall motion abnormalities. Doppler parameters are consistent with abnormal left ventricular relaxation (grade 1 diastolic dysfunction). Diabetes Mellitus Hypertension  Hypothyroidism  Hypercholesterolemia    I have  explainied the need for proceeding with aortic valve replacement and coronary artery bypass grafting because of the combination of critical and progressive aortic stenosis in the setting of 80% left main obstruction. The seriousness of both of these problems discussed and explained to the patient in detail. She's been reluctant in the past to consider surgery but with the current severity of the disease she is willing to proceed.  The goals risks and alternatives of the planned surgical procedure AVR and CABG have been discussed with the patient in  detail. The risks of the procedure including death, infection, stroke, myocardial infarction, bleeding, blood transfusion have all been discussed specifically.  I have quoted [name] a 5 % of perioperative mortality and a  complication rate as high as 25 %. The patient's questions have been answered.Claudette Ivonne Andrew is willing  to proceed with the planned procedure.    [name] MD  Beeper [phone] Office [phone] 06/25/2012 7:10 AM

## 2012-06-25 NOTE — Anesthesia Preprocedure Evaluation (Addendum)
Anesthesia Evaluation  Patient identified by MRN, date of birth, ID band Patient awake    Reviewed: Allergy & Precautions, H&P , NPO status , Patient's Chart, lab work & pertinent test results, reviewed documented beta blocker date and time   Airway Mallampati: II TM Distance: >3 FB     Dental   Pulmonary shortness of breath and at rest, asthma ,          Cardiovascular hypertension, On Medications and On Home Beta Blockers + CAD + Valvular Problems/Murmurs AS     Neuro/Psych PSYCHIATRIC DISORDERS    GI/Hepatic GERD-  Medicated and Controlled,  Endo/Other  diabetes, Well Controlled, Type 2Hypothyroidism obese  Renal/GU      Musculoskeletal   Abdominal   Peds  Hematology   Anesthesia Other Findings   Reproductive/Obstetrics                          Anesthesia Physical Anesthesia Plan  ASA: III  Anesthesia Plan: General   Post-op Pain Management:    Induction: Intravenous  Airway Management Planned: Oral ETT  Additional Equipment: Arterial line, CVP, PA Cath, TEE and Ultrasound Guidance Line Placement  Intra-op Plan:   Post-operative Plan: Post-operative intubation/ventilation  Informed Consent: I have reviewed the patients History and Physical, chart, labs and discussed the procedure including the risks, benefits and alternatives for the proposed anesthesia with the patient or authorized representative who has indicated his/her understanding and acceptance.     Plan Discussed with: Anesthesiologist, Surgeon and CRNA  Anesthesia Plan Comments:        Anesthesia Quick Evaluation

## 2012-06-25 NOTE — Progress Notes (Signed)
  Echocardiogram Echocardiogram Transesophageal has been performed.  [name] A 06/25/2012, 9:17 AM

## 2012-06-25 NOTE — Progress Notes (Signed)
New abrasion on face, pt. Reports that she "missed the curb" yesterday & fell impacting her face on the cement. Pt. Has fresh abrasion at bridge of nose & has put neosporin on the area yesterday, nothing today. Pt. Reports her upper lip is swelling as well. Pt.reports some soreness in her L hand.

## 2012-06-25 NOTE — Progress Notes (Signed)
Patient ID: [name], female   DOB: 1932/06/08, 79 y.o.   MRN: [id number]  Hemodynamically stable  CI 1.5 on dop 3  Weaning on vent  Urine output ok  CT output low  CBC    Component Value Date/Time   WBC 8.7 06/25/2012 1400   RBC 2.87* 06/25/2012 1400   HGB 8.8* 06/25/2012 1433   HCT 26.0* 06/25/2012 1433   PLT 89* 06/25/2012 1400   MCV 86.1 06/25/2012 1400   MCH 30.0 06/25/2012 1400   MCHC 34.8 06/25/2012 1400   RDW 15.0 06/25/2012 1400   LYMPHSABS 1.6 05/31/2012 1357   MONOABS 0.3 05/31/2012 1357   EOSABS 0.1 05/31/2012 1357   BASOSABS 0.0 05/31/2012 1357    BMET    Component Value Date/Time   NA 139 06/25/2012 1433   K 3.4* 06/25/2012 1433   CL 96 06/21/2012 1352   CO2 23 06/21/2012 1352   GLUCOSE 97 06/25/2012 1433   BUN 14 06/21/2012 1352   CREATININE 0.66 06/21/2012 1352   CALCIUM 9.8 06/21/2012 1352   GFRNONAA 83* 06/21/2012 1352   GFRAA >90 06/21/2012 1352    K+ repleated.   Wean to extubate.

## 2012-06-25 NOTE — Brief Op Note (Addendum)
06/25/2012  12:50 PM  PATIENT:  [name]  79 y.o. female  PRE-OPERATIVE DIAGNOSIS:  1. CAD (Left Main disease included) 2. Severe Aortic Stenosis   POST-OPERATIVE DIAGNOSIS:  1. CAD (Left Main disease included) 2. Severe Aortic Stenosis   PROCEDURE: INTRAOPERATIVE TRANSESOPHAGEAL ECHOCARDIOGRAM,  MEDIAN STERNOTOMY for CABG x 2 (LIMA to LAD and SVG to Circumflex) with EVH from the RIGHT THIGH,AORTIC VALVE REPLACEMENT (AVR) (using a Magna Ease pericardial Tissue valve, size 21 mm)    SURGEON:  Surgeon(s) and Role:    * [name], MD - Primary  PHYSICIAN ASSISTANT: Doree Fudge PA-c   ANESTHESIA:   general  EBL:  Total I/O In: 2250 [I.V.:2000; IV Piggyback:250] Out: 2100 [Urine:2100]    DRAINS: Chest Tube(s) in the Mediastinal and pleural spaces    SPECIMEN:  Source of Specimen:  Native aortic valve leaflets  DISPOSITION OF SPECIMEN:  PATHOLOGY  COUNTS CORRECT:  YES  DICTATION: .Dragon Dictation  PLAN OF CARE: Admit to inpatient   PATIENT DISPOSITION:  ICU - intubated and hemodynamically stable.   Delay start of Pharmacological VTE agent (>24hrs) due to surgical blood loss or risk of bleeding: yes  PRE OP WEIGHT: 83 kg

## 2012-06-25 NOTE — Progress Notes (Signed)
Utilization Review Completed.Enzio Buchler T2/25/2014  

## 2012-06-25 NOTE — Procedures (Signed)
Extubation Procedure Note  Patient Details:   Name: SUHEY RADFORD DOB: October 09, 1932 MRN: [id number]   Airway Documentation:  Airway 8 mm (Active)  Secured at (cm) 23 cm 06/25/2012  8:45 PM  Measured From Lips 06/25/2012  8:45 PM  Secured Location Right 06/25/2012  8:45 PM  Secured By Pink Tape 06/25/2012  8:45 PM  Site Condition Dry 06/25/2012  8:45 PM    Evaluation  O2 sats: stable throughout Complications: No apparent complications Patient did tolerate procedure well. Bilateral Breath Sounds: Rhonchi   Yes  Pt. Was extubated to a 2.5L [place] without any complications, dyspnea or stridor noted. Pt. Achieved a goal of -30 on NIF & 560 on VC. Pt. Was instructed on IS x 8. Highest goal achieved was .    Sheryle Hail 06/25/2012, 9:15 PM

## 2012-06-25 NOTE — Transfer of Care (Signed)
Immediate Anesthesia Transfer of Care Note  Patient: [name]  Procedure(s) Performed: Procedure(s): AORTIC VALVE REPLACEMENT (AVR) (N/A) Coronary artery bypass graft  times two using left internal mammary artery and right leg greater saphenous vein (N/A) INTRAOPERATIVE TRANSESOPHAGEAL ECHOCARDIOGRAM (N/A)  Patient Location: SICU  Anesthesia Type:General  Level of Consciousness: Patient remains intubated per anesthesia plan  Airway & Oxygen Therapy: Patient remains intubated per anesthesia plan and Patient placed on Ventilator (see vital sign flow sheet for setting)  Post-op Assessment: Report given to PACU RN and Post -op Vital signs reviewed and stable  Post vital signs: Reviewed and stable  Complications: No apparent anesthesia complications

## 2012-06-26 ENCOUNTER — Inpatient Hospital Stay (HOSPITAL_COMMUNITY): Payer: Medicare Other

## 2012-06-26 LAB — POCT I-STAT 3, ART BLOOD GAS (G3+)
Acid-base deficit: 4 mmol/L — ABNORMAL HIGH (ref 0.0–2.0)
Bicarbonate: 21 mEq/L (ref 20.0–24.0)
O2 Saturation: 100 %
TCO2: 22 mmol/L (ref 0–100)
pCO2 arterial: 38.4 mmHg (ref 35.0–45.0)
pH, Arterial: 7.345 — ABNORMAL LOW (ref 7.350–7.450)
pO2, Arterial: 211 mmHg — ABNORMAL HIGH (ref 80.0–100.0)

## 2012-06-26 LAB — GLUCOSE, CAPILLARY
Glucose-Capillary: 106 mg/dL — ABNORMAL HIGH (ref 70–99)
Glucose-Capillary: 107 mg/dL — ABNORMAL HIGH (ref 70–99)
Glucose-Capillary: 109 mg/dL — ABNORMAL HIGH (ref 70–99)
Glucose-Capillary: 109 mg/dL — ABNORMAL HIGH (ref 70–99)
Glucose-Capillary: 110 mg/dL — ABNORMAL HIGH (ref 70–99)
Glucose-Capillary: 110 mg/dL — ABNORMAL HIGH (ref 70–99)
Glucose-Capillary: 113 mg/dL — ABNORMAL HIGH (ref 70–99)
Glucose-Capillary: 121 mg/dL — ABNORMAL HIGH (ref 70–99)
Glucose-Capillary: 126 mg/dL — ABNORMAL HIGH (ref 70–99)
Glucose-Capillary: 131 mg/dL — ABNORMAL HIGH (ref 70–99)
Glucose-Capillary: 134 mg/dL — ABNORMAL HIGH (ref 70–99)
Glucose-Capillary: 136 mg/dL — ABNORMAL HIGH (ref 70–99)
Glucose-Capillary: 137 mg/dL — ABNORMAL HIGH (ref 70–99)
Glucose-Capillary: 137 mg/dL — ABNORMAL HIGH (ref 70–99)
Glucose-Capillary: 139 mg/dL — ABNORMAL HIGH (ref 70–99)
Glucose-Capillary: 139 mg/dL — ABNORMAL HIGH (ref 70–99)
Glucose-Capillary: 156 mg/dL — ABNORMAL HIGH (ref 70–99)
Glucose-Capillary: 157 mg/dL — ABNORMAL HIGH (ref 70–99)
Glucose-Capillary: 159 mg/dL — ABNORMAL HIGH (ref 70–99)
Glucose-Capillary: 165 mg/dL — ABNORMAL HIGH (ref 70–99)
Glucose-Capillary: 180 mg/dL — ABNORMAL HIGH (ref 70–99)
Glucose-Capillary: 180 mg/dL — ABNORMAL HIGH (ref 70–99)
Glucose-Capillary: 194 mg/dL — ABNORMAL HIGH (ref 70–99)
Glucose-Capillary: 238 mg/dL — ABNORMAL HIGH (ref 70–99)
Glucose-Capillary: 72 mg/dL (ref 70–99)
Glucose-Capillary: 72 mg/dL (ref 70–99)
Glucose-Capillary: 76 mg/dL (ref 70–99)
Glucose-Capillary: 82 mg/dL (ref 70–99)
Glucose-Capillary: 86 mg/dL (ref 70–99)

## 2012-06-26 LAB — POCT I-STAT, CHEM 8
BUN: 17 mg/dL (ref 6–23)
Calcium, Ion: 1.12 mmol/L — ABNORMAL LOW (ref 1.13–1.30)
Chloride: 107 mEq/L (ref 96–112)
Creatinine, Ser: 1 mg/dL (ref 0.50–1.10)
Glucose, Bld: 213 mg/dL — ABNORMAL HIGH (ref 70–99)
HCT: 25 % — ABNORMAL LOW (ref 36.0–46.0)
Hemoglobin: 8.5 g/dL — ABNORMAL LOW (ref 12.0–15.0)
Potassium: 4.3 mEq/L (ref 3.5–5.1)
Sodium: 140 mEq/L (ref 135–145)
TCO2: 23 mmol/L (ref 0–100)

## 2012-06-26 LAB — CREATININE, SERUM: GFR calc Af Amer: 57 mL/min — ABNORMAL LOW (ref >90)

## 2012-06-26 LAB — POCT I-STAT 4, (NA,K, GLUC, HGB,HCT)
Glucose, Bld: 139 mg/dL — ABNORMAL HIGH (ref 70–99)
HCT: 35 % — ABNORMAL LOW (ref 36.0–46.0)
Hemoglobin: 11.9 g/dL — ABNORMAL LOW (ref 12.0–15.0)
Potassium: 4.6 mEq/L (ref 3.5–5.1)
Sodium: 138 mEq/L (ref 135–145)

## 2012-06-26 LAB — CBC
HCT: 18.5 % — ABNORMAL LOW (ref 36.0–46.0)
HCT: 23.8 % — ABNORMAL LOW (ref 36.0–46.0)
Hemoglobin: 6.6 g/dL — CL (ref 12.0–15.0)
MCH: 30.7 pg (ref 26.0–34.0)
MCH: 30.7 pg (ref 26.0–34.0)
MCHC: 35.7 g/dL (ref 30.0–36.0)
MCHC: 35.7 g/dL (ref 30.0–36.0)
MCV: 86 fL (ref 78.0–100.0)
MCV: 85.9 fL (ref 78.0–100.0)
Platelets: 96 10*3/uL — ABNORMAL LOW (ref 150–400)
RDW: 15.4 % (ref 11.5–15.5)
RDW: 15.5 % (ref 11.5–15.5)

## 2012-06-26 LAB — BASIC METABOLIC PANEL
BUN: 11 mg/dL (ref 6–23)
Creatinine, Ser: 0.68 mg/dL (ref 0.50–1.10)
GFR calc Af Amer: >90 mL/min (ref >90)
GFR calc non Af Amer: 82 mL/min — ABNORMAL LOW (ref >90)
Glucose, Bld: 115 mg/dL — ABNORMAL HIGH (ref 70–99)
Potassium: 3.9 mEq/L (ref 3.5–5.1)

## 2012-06-26 MED ORDER — INSULIN ASPART 100 UNIT/ML ~~LOC~~ SOLN
0.0000 [IU] | SUBCUTANEOUS | Status: DC
  Administered 2012-06-26: 4 [IU] via SUBCUTANEOUS

## 2012-06-26 MED ORDER — INSULIN DETEMIR 100 UNIT/ML ~~LOC~~ SOLN
10.0000 [IU] | Freq: Every day | SUBCUTANEOUS | Status: DC
  Administered 2012-06-26: 10 [IU] via SUBCUTANEOUS

## 2012-06-26 MED ORDER — SODIUM CHLORIDE 0.9 % IV SOLN
INTRAVENOUS | Status: DC
  Administered 2012-06-26: 3.6 [IU]/h via INTRAVENOUS
  Filled 2012-06-26: qty 1

## 2012-06-26 MED ORDER — FUROSEMIDE 10 MG/ML IJ SOLN
40.0000 mg | Freq: Once | INTRAMUSCULAR | Status: AC
  Administered 2012-06-26: 40 mg via INTRAVENOUS
  Filled 2012-06-26: qty 4

## 2012-06-26 MED ORDER — ENOXAPARIN SODIUM 30 MG/0.3ML ~~LOC~~ SOLN
30.0000 mg | Freq: Every day | SUBCUTANEOUS | Status: DC
  Administered 2012-06-26 – 2012-07-02 (×7): 30 mg via SUBCUTANEOUS
  Filled 2012-06-26 (×8): qty 0.3

## 2012-06-26 MED FILL — Potassium Chloride Inj 2 mEq/ML: INTRAVENOUS | Qty: 40 | Status: AC

## 2012-06-26 MED FILL — Magnesium Sulfate Inj 50%: INTRAMUSCULAR | Qty: 10 | Status: AC

## 2012-06-26 NOTE — Op Note (Addendum)
[name], [name]                ACCOUNT NO.:  [id number] eft   MEDICAL RECORD NO.:  [id number]  LOCATION:  2316                         FACILITY:  [facility]  PHYSICIAN:  Sheliah Plane, MD    DATE OF BIRTH:  [birth year]  DATE OF PROCEDURE:  06/25/2012 DATE OF DISCHARGE:                              OPERATIVE REPORT   PREOPERATIVE DIAGNOSIS:  Left main coronary artery disease with Critical Aortic Stenosis.  POSTOPERATIVE DIAGNOSIS: SAME   SURGICAL PROCEDURE:  Coronary artery bypass grafting x2 with the left internal mammary to the left anterior descending coronary artery, reverse saphenous vein graft to the circumflex coronary artery, and aortic valve replacement with a pericardial [employer] tissue valve, model #3300 TFX, serial [id number], 21 mm, and right thigh Endovein harvesting.  SURGEON:  Sheliah Plane, MD  FIRST ASSISTANT:  Doree Fudge, PA  BRIEF HISTORY:  The patient is a 79 year old female seen by Dr. Kristeen [name] with increasing symptoms of shortness of breath with exertion. Serial echocardiograms had demonstrated progressive aortic stenosis with the most recent echocardiogram demonstrating velocities of greater than 500 cm/second consistent with critical aortic stenosis.  The patient underwent a cardiac catheterization as an outpatient and was found to have critical left main, patent right coronary artery.  She had preserved left ventricular function with significant left ventricular hypertrophy by echocardiogram.  The patient was referred to the office for consideration of aortic valve replacement and coronary artery bypass grafting.  The risks and options of surgery were discussed with the patient in detail, and she was willing to proceed with aortic valve replacement and coronary artery bypass graft, signed informed consent. With her age, a tissue valve was recommended.  DESCRIPTION OF PROCEDURE:  With Swan-Ganz and arterial line monitors  in place, the patient underwent general endotracheal anesthesia without incidence.  Skin of chest and legs was prepped with Betadine and draped in usual sterile manner.  A TEE probe was placed by Dr. Dan [name]. Description of findings are under separate note but this confirmed a highly calcified, severely limited trileaflet aortic valve with evidence of left ventricular hypertrophy.  She had trace mitral regurgitation. With the [employer] endo vein harvesting system, vein was harvested from the right thigh.  Secondary to her obesity this was somewhat difficult but a very satisfactory piece of vein was harvested.  Median sternotomy was performed.  Left internal mammary artery was dissected down as a pedicle graft.  The distal artery was divided and had good free flow. The patient was then systemically heparinized.  The ascending aorta was cannulated.  The right atrium was cannulated.  Retrograde cardioplegia catheter was placed into the coronary sinus under ultrasound-guidance. The patient was placed on cardiopulmonary bypass 2.4 L/min/m2.  Sites of anastomosis were dissected at the epicardium and the body temperature was cooled to 32 degrees.  A right superior pulmonary vein vent was placed.  Aortic crossclamp was placed.  A 500 mL of cold blood potassium cardioplegia was administered antegrade into the aortic root. Additional retrograde cardioplegia was also administered with diastolic arrest of the heart.  Myocardial septal temperature was monitored throughout the cross-clamp.  The heart was elevated and the circumflex which was intramyocardial  was dissected free and identified.  The vessel was opened and admitted a 1.5-mm probe proximally and distally.  Using a running 7-0 Prolene, distal anastomosis was performed.  Attention was then turned to the mid portion of the left anterior descending coronary artery which was opened and was of good quality vessel.  The left internal mammary was then  anastomosed to the left anterior descending coronary artery with a running 8-0 Prolene.  Intermittently, retrograde cardioplegia was administered and additional cardioplegia down the vein graft was administered.  Attention was then turned to the aorta which had a significant amount of plaque and calcium in the very proximal aorta.  Just above this, the aorta was opened.  There was a large cauliflower calcium deposit at the ostium of the left main.  The valve was severely calcified.  The valve was excised.  The sino-tubular ridge was relatively small.  The annulus itself was slightly larger.  The aorta was somewhat limited by the amount of calcium in the very proximal aorta.  With the valve excised, we selected a 21 pericardial tissue valve.  A #2 Tycron pledgeted sutures were placed circumferentially in the anulus with the pledgets on the ventricular surface.  Care was taken to remove all loose calcific debris.  The aortic prosthesis was then secured in place with 12 sutures.  There was some difficulty in getting through the sino-tubular ridge but once seated the valve well without obstruction of the right or left ostium.  With the valve in place, the aortotomy was then closed with horizontal mattress 4-0 Prolene suture over felt strips, the second layer with over-and-over Prolene.  The aortic root vent was removed.  A punch aortotomy was created, and the vein graft was anastomosed to the ascending aorta.  Before completion of this, the heart was allowed to passively fill and de-air.  The bulldog on the mammary artery was removed with total rise in myocardial septal temperature.  Aortic crossclamp was removed with a total cross-clamp time of 133 minutes.  Sites of anastomosis were inspected and free of bleeding.  The patient initially required electric defibrillation and was in heart block requiring AV pacing.  This ultimately resolved into a sinus rhythm and atrial paced.  The right  superior pulmonary vein vent was removed.  An 18-gauge needle was introduced into the left ventricular apex to further de-air the heart.  With body temperature rewarmed to 37 degrees she was then ventilated and weaned from cardiopulmonary bypass without difficulty.  TEE showed good function of the prosthetic valve and she remained hemodynamically stable, was decannulated in usual fashion.  Protamine sulfate was administered. With the operative field hemostatic, a left pleural tube and Blake mediastinal drain were left in place.  Pericardium was loosely reapproximated.  Sternum closed with #6 stainless steel wire.  Fascia closed with interrupted 0 Vicryl, running 3-0 Vicryl subcutaneous tissue, and 4-0 subcuticular stitch in skin edges.  Dry dressings were applied.  Sponge and needle count was correct at the completion of the procedure.  The patient did require 1 packed red blood cells for low starting hematocrit of 30 and low hematocrit while on bypass.  Total pump time was 164 minutes.     Sheliah Plane, MD     EG/MEDQ  D:  06/26/2012  T:  06/26/2012  Job:  [id number]  cc:   Vesta Mixer, M.D.

## 2012-06-26 NOTE — Progress Notes (Signed)
Patient ID: [name], female   DOB: 08/10/1932, 79 y.o.   MRN: [id number]                   [address]            [name] [address]          [phone]     1 Day Post-Op Procedure(s) (LRB): AORTIC VALVE REPLACEMENT (AVR) (N/A) Coronary artery bypass graft  times two using left internal mammary artery and right leg greater saphenous vein (N/A) INTRAOPERATIVE TRANSESOPHAGEAL ECHOCARDIOGRAM (N/A)  Total Length of Stay:  LOS: 1 day  BP 130/72  Pulse 69  Temp(Src) 97.8 F (36.6 C) (Oral)  Resp 23  Wt 195 lb 8.8 oz (88.7 kg)  BMI 36.97 kg/m2  SpO2 96%  .Intake/Output     02/25 0701 - 02/26 0700 02/26 0701 - 02/27 0700   P.O.  960   I.V. (mL/kg) 5008.1 (56.5) 567.4 (6.4)   Blood 500 350   IV Piggyback 2550    Total Intake(mL/kg) 8058.1 (90.8) 1877.4 (21.2)   Urine (mL/kg/hr) 4430 (2.1) 475 (0.4)   Blood 1300 (0.6)    Chest Tube 390 (0.2) 100 (0.1)   Total Output 6120 575   Net +1938.1 +1302.4          . sodium chloride Stopped (06/26/12 0940)  . sodium chloride 20 mL/hr at 06/25/12 1500  . dexmedetomidine Stopped (06/25/12 1800)  . DOPamine Stopped (06/26/12 0730)  . lactated ringers 20 mL/hr at 06/25/12 1500  . nitroGLYCERIN Stopped (06/26/12 0745)  . phenylephrine (NEO-SYNEPHRINE) Adult infusion Stopped (06/26/12 0600)     Lab Results  Component Value Date   WBC 11.2* 06/26/2012   HGB 8.5* 06/26/2012   HCT 25.0* 06/26/2012   PLT 96* 06/26/2012   GLUCOSE 213* 06/26/2012   ALT 12 06/21/2012   AST 23 06/21/2012   NA 140 06/26/2012   K 4.3 06/26/2012   CL 107 06/26/2012   CREATININE 1.00 06/26/2012   BUN 17 06/26/2012   CO2 24 06/26/2012   INR 1.57* 06/25/2012   HGBA1C 7.7* 06/21/2012   Stable day Walked in hall Off drips Cr 1.0 Additional lasix tonight  [name] MD  Beeper [phone] Office [phone] 06/26/2012 6:58 PM

## 2012-06-26 NOTE — Progress Notes (Addendum)
TCTS DAILY PROGRESS NOTE                   [address]            [name] [address]          [phone]      1 Day Post-Op Procedure(s) (LRB): AORTIC VALVE REPLACEMENT (AVR) (N/A) Coronary artery bypass graft  times two using left internal mammary artery and right leg greater saphenous vein (N/A) INTRAOPERATIVE TRANSESOPHAGEAL ECHOCARDIOGRAM (N/A)  Total Length of Stay:  LOS: 1 day   Subjective: Feels well, no complaints.     Objective: Vital signs in last 24 hours: Temp:  [93.6 F (34.2 C)-98.2 F (36.8 C)] 98.1 F (36.7 C) (02/26 0800) Pulse Rate:  [74-103] 74 (02/26 0800) Cardiac Rhythm:  [-] Normal sinus rhythm (02/26 0800) Resp:  [12-22] 18 (02/26 0800) BP: (85-132)/(49-72) 96/51 mmHg (02/26 0800) SpO2:  [91 %-100 %] 99 % (02/26 0800) Arterial Line BP: (67-188)/(38-70) 138/51 mmHg (02/26 0800) FiO2 (%):  [40 %-50.1 %] 40 % (02/25 2100) Weight:  [195 lb 8.8 oz (88.7 kg)] 195 lb 8.8 oz (88.7 kg) (02/26 0500)  Filed Weights   06/24/12 1300 06/26/12 0500  Weight: 182 lb (82.555 kg) 195 lb 8.8 oz (88.7 kg)    Weight change: 13 lb 8.8 oz (6.145 kg)    CBGs: 97-76-86-82-110-116-137-180-156-134-110-109-109-106   Hemodynamic parameters for last 24 hours: PAP: (16-40)/(6-23) 36/19 mmHg CO:  [2.6 L/min-6.7 L/min] 6.7 L/min CI:  [1.4 L/min/m2-3.7 L/min/m2] 3.7 L/min/m2  Intake/Output from previous day: 02/25 0701 - 02/26 0700 In: 8058.1 [I.V.:5008.1; Blood:500; IV Piggyback:2550] Out: 6120 [Urine:4430; Blood:1300; Chest Tube:390]  Intake/Output this shift: Total I/O In: 193.6 [P.O.:120; I.V.:73.6] Out: 150 [Urine:150]  Current Meds: Scheduled Meds: . acetaminophen  1,000 mg Oral Q6H   Or  . acetaminophen (TYLENOL) oral liquid 160 mg/5 mL  975 mg Per Tube Q6H  . aspirin EC  325 mg Oral Daily   Or  . aspirin  324 mg Per Tube Daily  . atorvastatin  40 mg Oral q1800  . bisacodyl  10 mg Oral Daily   Or  . bisacodyl  10 mg Rectal Daily    . cefUROXime (ZINACEF)  IV  1.5 g Intravenous Q12H  . docusate sodium  200 mg Oral Daily  . insulin regular  0-10 Units Intravenous TID WC  . levothyroxine  100 mcg Oral QAC breakfast  . metoCLOPramide (REGLAN) injection  10 mg Intravenous Q6H  . metoprolol tartrate  12.5 mg Oral BID   Or  . metoprolol tartrate  12.5 mg Per Tube BID  . multivitamin with minerals  1 tablet Oral Daily  . [START ON 06/27/2012] pantoprazole  40 mg Oral Daily  . sodium chloride  3 mL Intravenous Q12H   Continuous Infusions: . sodium chloride 20 mL/hr at 06/25/12 1500  . sodium chloride 20 mL/hr at 06/25/12 1500  . dexmedetomidine Stopped (06/25/12 1800)  . DOPamine Stopped (06/26/12 0730)  . insulin (NOVOLIN-R) infusion 1.1 Units/hr (06/26/12 0800)  . lactated ringers 20 mL/hr at 06/25/12 1500  . nitroGLYCERIN Stopped (06/26/12 0745)  . phenylephrine (NEO-SYNEPHRINE) Adult infusion Stopped (06/26/12 0600)   PRN Meds:.sodium chloride, metoprolol, midazolam, morphine injection, ondansetron (ZOFRAN) IV, oxyCODONE, sodium chloride   Physical Exam: General appearance: alert, cooperative and no distress Heart: regular rate and rhythm, S1, S2 normal, no murmur, click, rub or gallop Lungs: diminished breath sounds bibasilar Extremities: edema Mild LE, R>L Wound: Dressed and dry  Lab Results: CBC: Recent Labs  06/25/12 2009 06/25/12 2021 06/26/12 0327  WBC 7.5  --  7.9  HGB 7.0* 6.1* 6.6*  HCT 19.4* 18.0* 18.5*  PLT 73*  --  85*   BMET:  Recent Labs  06/25/12 2021 06/26/12 0327  NA 142 141  K 4.6 3.9  CL 109 109  CO2  --  24  GLUCOSE 116* 115*  BUN 10 11  CREATININE 0.70 0.68  CALCIUM  --  7.8*    PT/INR:  Recent Labs  06/25/12 1400  LABPROT 18.3*  INR 1.57*   Radiology: Dg Chest Portable 1 View In Am  06/26/2012  *RADIOLOGY REPORT*  Clinical Data: Status post aortic valve replacement  PORTABLE CHEST - 1 VIEW  Comparison: 06/25/2012  Findings: Interval extubation and removal of  enteric tube.  Stable left chest tube and mediastinal drain.  No pneumothorax is seen.  Stable right IJ Swan-Ganz catheter terminating in the main pulmonary artery.  Mild patchy bibasilar opacities, likely atelectasis, with possible small left pleural effusion.  Cardiomegaly. Postsurgical changes related to prior CABG. Prosthetic aortic valve.  IMPRESSION: Interval extubation.  Stable left chest tube and mediastinal drain.  No pneumothorax is seen.  Bibasilar atelectasis with possible small left pleural effusion.   Original Report Authenticated By: Charline Bills, M.D.    Dg Chest Portable 1 View  06/25/2012  *RADIOLOGY REPORT*  Clinical Data: 79 year old female status post CABG.  PORTABLE CHEST - 1 VIEW  Comparison: Preoperative chest radiograph 06/21/2012.  Findings: Semi upright AP portable view 1445 hours.  Endotracheal tube tip is at the carina directed into the right mainstem bronchus. NG tube courses to the abdomen, side hole the level of the stomach. Right IJ approach Swan-Ganz catheter, tip at the level of the main pulmonary artery.  Left chest tube in place.  Mediastinal drain in place.  Sequelae of CABG and cardiac valve replacement. No pneumothorax or pulmonary edema.  Retrocardiac opacity, favor atelectasis and small effusion.  IMPRESSION: 1.  Endotracheal tube tip is at the carina directed toward the right mainstem bronchus.  Retract 1-2 cm for more optimal placement. 2.  Other lines and tubes appear adequately placed as above. 3.  No pneumothorax.  Retrocardiac atelectasis and probable small pleural effusions.   Original Report Authenticated By: Erskine Speed, M.D.      Assessment/Plan: S/P Procedure(s) (LRB): AORTIC VALVE REPLACEMENT (AVR) (N/A) Coronary artery bypass graft  times two using left internal mammary artery and right leg greater saphenous vein (N/A) INTRAOPERATIVE TRANSESOPHAGEAL ECHOCARDIOGRAM (N/A)  CV- Maintaining SR, BPs generally stable.   Vol overload-  diurese.  Expected postop blood loss anemia- will transfuse this am.  DM- will wean insulin gtt and start Lantus as tolerated.  Mobilize, d/c lines and tubes, routine POD#1 progression.      COLLINS,GINA H 06/26/2012 8:20 AM I have seen and examined [name] and agree with the above assessment  and plan. Expected Acute  Blood - loss Anemia and preop anemia prop HCT 30%  [name] MD Beeper [phone] Office [phone] 06/26/2012 8:39 AM

## 2012-06-27 ENCOUNTER — Inpatient Hospital Stay (HOSPITAL_COMMUNITY): Payer: Medicare Other

## 2012-06-27 ENCOUNTER — Encounter (HOSPITAL_COMMUNITY): Payer: Self-pay | Admitting: *Deleted

## 2012-06-27 DIAGNOSIS — E1165 Type 2 diabetes mellitus with hyperglycemia: Secondary | ICD-10-CM

## 2012-06-27 DIAGNOSIS — IMO0001 Reserved for inherently not codable concepts without codable children: Secondary | ICD-10-CM

## 2012-06-27 LAB — GLUCOSE, CAPILLARY
Glucose-Capillary: 97 mg/dL (ref 70–99)
Glucose-Capillary: 91 mg/dL (ref 70–99)
Glucose-Capillary: 93 mg/dL (ref 70–99)
Glucose-Capillary: 99 mg/dL (ref 70–99)
Glucose-Capillary: 126 mg/dL — ABNORMAL HIGH (ref 70–99)
Glucose-Capillary: 155 mg/dL — ABNORMAL HIGH (ref 70–99)
Glucose-Capillary: 151 mg/dL — ABNORMAL HIGH (ref 70–99)
Glucose-Capillary: 178 mg/dL — ABNORMAL HIGH (ref 70–99)
Glucose-Capillary: 89 mg/dL (ref 70–99)

## 2012-06-27 LAB — BASIC METABOLIC PANEL
BUN: 23 mg/dL (ref 6–23)
CO2: 25 mEq/L (ref 19–32)
Calcium: 8 mg/dL — ABNORMAL LOW (ref 8.4–10.5)
Chloride: 105 mEq/L (ref 96–112)
Creatinine, Ser: 1.13 mg/dL — ABNORMAL HIGH (ref 0.50–1.10)
GFR calc Af Amer: 53 mL/min — ABNORMAL LOW (ref >90)
GFR calc non Af Amer: 46 mL/min — ABNORMAL LOW (ref >90)
Glucose, Bld: 105 mg/dL — ABNORMAL HIGH (ref 70–99)
Potassium: 4.2 mEq/L (ref 3.5–5.1)
Sodium: 138 mEq/L (ref 135–145)

## 2012-06-27 LAB — CBC
HCT: 23.1 % — ABNORMAL LOW (ref 36.0–46.0)
Hemoglobin: 8.2 g/dL — ABNORMAL LOW (ref 12.0–15.0)
MCH: 30.8 pg (ref 26.0–34.0)
MCHC: 35.5 g/dL (ref 30.0–36.0)
MCV: 86.8 fL (ref 78.0–100.0)
Platelets: 111 10*3/uL — ABNORMAL LOW (ref 150–400)
RBC: 2.66 MIL/uL — ABNORMAL LOW (ref 3.87–5.11)
RDW: 15.6 % — ABNORMAL HIGH (ref 11.5–15.5)
WBC: 11.5 10*3/uL — ABNORMAL HIGH (ref 4.0–10.5)

## 2012-06-27 MED ORDER — METOPROLOL TARTRATE 25 MG/10 ML ORAL SUSPENSION
25.0000 mg | Freq: Two times a day (BID) | ORAL | Status: DC
  Filled 2012-06-27 (×4): qty 10

## 2012-06-27 MED ORDER — GUAIFENESIN ER 600 MG PO TB12
600.0000 mg | ORAL_TABLET | Freq: Two times a day (BID) | ORAL | Status: DC
  Administered 2012-06-27 (×2): 600 mg via ORAL
  Filled 2012-06-27 (×4): qty 1

## 2012-06-27 MED ORDER — INSULIN ASPART 100 UNIT/ML ~~LOC~~ SOLN
0.0000 [IU] | SUBCUTANEOUS | Status: AC
  Administered 2012-06-27: 2 [IU] via SUBCUTANEOUS

## 2012-06-27 MED ORDER — INSULIN ASPART 100 UNIT/ML ~~LOC~~ SOLN
0.0000 [IU] | SUBCUTANEOUS | Status: DC
  Administered 2012-06-27 (×2): 2 [IU] via SUBCUTANEOUS
  Administered 2012-06-27: 4 [IU] via SUBCUTANEOUS

## 2012-06-27 MED ORDER — METOPROLOL TARTRATE 25 MG PO TABS
25.0000 mg | ORAL_TABLET | Freq: Two times a day (BID) | ORAL | Status: DC
  Administered 2012-06-27 (×2): 25 mg via ORAL
  Filled 2012-06-27 (×4): qty 1

## 2012-06-27 MED ORDER — INSULIN DETEMIR 100 UNIT/ML ~~LOC~~ SOLN
15.0000 [IU] | Freq: Every day | SUBCUTANEOUS | Status: DC
  Administered 2012-06-27 – 2012-06-28 (×2): 15 [IU] via SUBCUTANEOUS

## 2012-06-27 MED ORDER — FUROSEMIDE 10 MG/ML IJ SOLN
40.0000 mg | Freq: Once | INTRAMUSCULAR | Status: AC
  Administered 2012-06-27: 40 mg via INTRAVENOUS
  Filled 2012-06-27: qty 4

## 2012-06-27 MED FILL — Lidocaine HCl IV Inj 20 MG/ML: INTRAVENOUS | Qty: 5 | Status: AC

## 2012-06-27 MED FILL — Heparin Sodium (Porcine) Inj 1000 Unit/ML: INTRAMUSCULAR | Qty: 20 | Status: AC

## 2012-06-27 MED FILL — Mannitol IV Soln 20%: INTRAVENOUS | Qty: 500 | Status: AC

## 2012-06-27 MED FILL — Sodium Chloride Irrigation Soln 0.9%: Qty: 3000 | Status: AC

## 2012-06-27 MED FILL — Heparin Sodium (Porcine) Inj 1000 Unit/ML: INTRAMUSCULAR | Qty: 10 | Status: AC

## 2012-06-27 MED FILL — Heparin Sodium (Porcine) Inj 1000 Unit/ML: INTRAMUSCULAR | Qty: 30 | Status: AC

## 2012-06-27 MED FILL — Sodium Bicarbonate IV Soln 8.4%: INTRAVENOUS | Qty: 50 | Status: AC

## 2012-06-27 MED FILL — Electrolyte-R (PH 7.4) Solution: INTRAVENOUS | Qty: 4000 | Status: AC

## 2012-06-27 MED FILL — Sodium Bicarbonate IV Soln 8.4%: INTRAVENOUS | Qty: 150 | Status: AC

## 2012-06-27 MED FILL — Sodium Chloride IV Soln 0.9%: INTRAVENOUS | Qty: 1000 | Status: AC

## 2012-06-27 NOTE — Progress Notes (Addendum)
T[facility] DAILY PROGRESS NOTE                   [address]            [name] [address]          [phone]      2 Days Post-Op Procedure(s) (LRB): AORTIC VALVE REPLACEMENT (AVR) (N/A) Coronary artery bypass graft  times two using left internal mammary artery and right leg greater saphenous vein (N/A) INTRAOPERATIVE TRANSESOPHAGEAL ECHOCARDIOGRAM (N/A)  Total Length of Stay:  LOS: 2 days   Subjective: Overall feels well.  Walked yesterday.  Sore, + cough with thick mucus.     Objective: Vital signs in last 24 hours: Temp:  [97.8 F (36.6 C)-98.7 F (37.1 C)] 98.6 F (37 C) (02/27 0715) Pulse Rate:  [64-74] 71 (02/27 0600) Cardiac Rhythm:  [-] Normal sinus rhythm (02/27 0400) Resp:  [17-23] 22 (02/27 0600) BP: (90-150)/(43-72) 135/66 mmHg (02/27 0600) SpO2:  [94 %-100 %] 94 % (02/27 0600) Arterial Line BP: (113-153)/(42-55) 138/49 mmHg (02/26 1300) Weight:  [195 lb 15.8 oz (88.9 kg)] 195 lb 15.8 oz (88.9 kg) (02/27 0500)  Filed Weights   06/24/12 1300 06/26/12 0500 06/27/12 0500  Weight: 182 lb (82.555 kg) 195 lb 8.8 oz (88.7 kg) 195 lb 15.8 oz (88.9 kg)    Weight change: 7.1 oz (0.2 kg)   Hemodynamic parameters for last 24 hours: PAP: (36-42)/(19-22) 42/20 mmHg CO:  [6.7 L/min] 6.7 L/min CI:  [3.7 L/min/m2] 3.7 L/min/m2  Intake/Output from previous day: 02/26 0701 - 02/27 0700 In: 2285.6 [P.O.:1080; I.V.:805.6; Blood:350; IV Piggyback:50] Out: 1075 [Urine:875; Chest Tube:200]    CBGs: 180-165-213-238-194-159-137-107-91-93-99     Current Meds: Scheduled Meds: . acetaminophen  1,000 mg Oral Q6H   Or  . acetaminophen (TYLENOL) oral liquid 160 mg/5 mL  975 mg Per Tube Q6H  . aspirin EC  325 mg Oral Daily   Or  . aspirin  324 mg Per Tube Daily  . atorvastatin  40 mg Oral q1800  . bisacodyl  10 mg Oral Daily   Or  . bisacodyl  10 mg Rectal Daily  . docusate sodium  200 mg Oral Daily  . enoxaparin  30 mg Subcutaneous QHS  . insulin aspart   0-24 Units Subcutaneous Q2H   Followed by  . insulin aspart  0-24 Units Subcutaneous Q4H  . insulin detemir  10 Units Subcutaneous Q1200  . levothyroxine  100 mcg Oral QAC breakfast  . metoprolol tartrate  12.5 mg Oral BID   Or  . metoprolol tartrate  12.5 mg Per Tube BID  . multivitamin with minerals  1 tablet Oral Daily  . pantoprazole  40 mg Oral Daily  . sodium chloride  3 mL Intravenous Q12H   Continuous Infusions: . sodium chloride Stopped (06/26/12 0940)  . sodium chloride 20 mL/hr at 06/25/12 1500  . dexmedetomidine Stopped (06/25/12 1800)  . DOPamine Stopped (06/26/12 0730)  . lactated ringers Stopped (06/27/12 0600)  . nitroGLYCERIN Stopped (06/26/12 0745)  . phenylephrine (NEO-SYNEPHRINE) Adult infusion Stopped (06/26/12 0600)   PRN Meds:.sodium chloride, metoprolol, midazolam, morphine injection, ondansetron (ZOFRAN) IV, oxyCODONE, sodium chloride   Physical Exam: General appearance: alert, cooperative and no distress Heart: regular rate and rhythm, S1, S2 normal, no murmur, click, rub or gallop Lungs: diminished breath sounds bibasilar Extremities: edema mild LE  Wound: Dressed and dry    Lab Results: CBC: Recent Labs  06/26/12 1600 06/26/12 1701 06/27/12 0350  WBC 11.2*  --  11.5*  HGB 8.5* 8.5* 8.2*  HCT 23.8* 25.0* 23.1*  PLT 96*  --  111*   BMET:  Recent Labs  06/26/12 0327  06/26/12 1701 06/27/12 0350  NA 141  < > 140 138  K 3.9  < > 4.3 4.2  CL 109  --  107 105  CO2 24  --   --  25  GLUCOSE 115*  < > 213* 105*  BUN 11  --  17 23  CREATININE 0.68  < > 1.00 1.13*  CALCIUM 7.8*  --   --  8.0*  < > = values in this interval not displayed.  PT/INR:  Recent Labs  06/25/12 1400  LABPROT 18.3*  INR 1.57*   Radiology: Dg Chest Portable 1 View In Am  06/27/2012  *RADIOLOGY REPORT*  Clinical Data: Postop aortic valve replacement  PORTABLE CHEST - 1 VIEW  Comparison: Prior chest x-ray 06/26/2012  Findings: Interval removal of the Swan-Ganz  catheter and subxiphoid approach mediastinal drain.  The right IJ vascular sheath remains in unchanged position with the tip in the mid superior vena cava. Left thoracostomy tube is in unchanged position.  Stable mild cardiomegaly.  Surgical changes of median sternotomy with evidence of prior CABG and mitral valve replacement.  Slightly increased bibasilar opacities and small bilateral effusions.  No pneumothorax.  Mild vascular congestion without edema.  IMPRESSION:  1.  Slightly enlarged bilateral pleural effusions and associated bibasilar atelectasis. 2.  Vascular congestion without overt edema. 3.  Interval removal of Swan-Ganz catheter and subxiphoid mediastinal drain.   Original Report Authenticated By: Malachy Moan, M.D.    Dg Chest Portable 1 View In Am  06/26/2012  *RADIOLOGY REPORT*  Clinical Data: Status post aortic valve replacement  PORTABLE CHEST - 1 VIEW  Comparison: 06/25/2012  Findings: Interval extubation and removal of enteric tube.  Stable left chest tube and mediastinal drain.  No pneumothorax is seen.  Stable right IJ Swan-Ganz catheter terminating in the main pulmonary artery.  Mild patchy bibasilar opacities, likely atelectasis, with possible small left pleural effusion.  Cardiomegaly. Postsurgical changes related to prior CABG. Prosthetic aortic valve.  IMPRESSION: Interval extubation.  Stable left chest tube and mediastinal drain.  No pneumothorax is seen.  Bibasilar atelectasis with possible small left pleural effusion.   Original Report Authenticated By: Charline Bills, M.D.    Dg Chest Portable 1 View  06/25/2012  *RADIOLOGY REPORT*  Clinical Data: 79 year old female status post CABG.  PORTABLE CHEST - 1 VIEW  Comparison: Preoperative chest radiograph 06/21/2012.  Findings: Semi upright AP portable view 1445 hours.  Endotracheal tube tip is at the carina directed into the right mainstem bronchus. NG tube courses to the abdomen, side hole the level of the stomach. Right IJ  approach Swan-Ganz catheter, tip at the level of the main pulmonary artery.  Left chest tube in place.  Mediastinal drain in place.  Sequelae of CABG and cardiac valve replacement. No pneumothorax or pulmonary edema.  Retrocardiac opacity, favor atelectasis and small effusion.  IMPRESSION: 1.  Endotracheal tube tip is at the carina directed toward the right mainstem bronchus.  Retract 1-2 cm for more optimal placement. 2.  Other lines and tubes appear adequately placed as above. 3.  No pneumothorax.  Retrocardiac atelectasis and probable small pleural effusions.   Original Report Authenticated By: Erskine Speed, M.D.      Assessment/Plan: S/P Procedure(s) (LRB): AORTIC VALVE REPLACEMENT (AVR) (N/A) Coronary artery bypass graft  times  two using left internal mammary artery and right leg greater saphenous vein (N/A) INTRAOPERATIVE TRANSESOPHAGEAL ECHOCARDIOGRAM (N/A) CV- Maintaining SR, BPs trending up. Will increase beta blocker.  Was on multiple hypertension meds at home. Vol overload-  UOP over the past few hours has decreased and Cr has increased slightly.  Will repeat IV Lasix this am. Expected postop blood loss anemia- improved after transfusion. DM- titrate Levemir, add po meds as po intake improves. Mobilize, continue pulm toilet.  Will add Mucinex for thick sputum. Hopefully can d/c remaining CT, may need to leave Foley 1 more day to measure UOP.       [name],[name] 06/27/2012 7:58 AM  I have seen and examined [name] and agree with the above assessment  and plan.  [name] MD Beeper [phone] Office [phone] 06/27/2012 8:30 AM

## 2012-06-27 NOTE — Anesthesia Postprocedure Evaluation (Signed)
  Anesthesia Post-op Note  Patient: [name]  Procedure(s) Performed: Procedure(s): AORTIC VALVE REPLACEMENT (AVR) (N/A) Coronary artery bypass graft  times two using left internal mammary artery and right leg greater saphenous vein (N/A) INTRAOPERATIVE TRANSESOPHAGEAL ECHOCARDIOGRAM (N/A)  Patient Location: ICU  Anesthesia Type:General  Level of Consciousness: sedated  Airway and Oxygen Therapy: Patient remains intubated per anesthesia plan  Post-op Pain: none  Post-op Assessment: Post-op Vital signs reviewed, Patient's Cardiovascular Status Stable and Respiratory Function Stable  Post-op Vital Signs: Reviewed and stable  Complications: No apparent anesthesia complications

## 2012-06-27 NOTE — Care Management Note (Unsigned)
    Page 1 of 1   07/03/2012     11:50:46 AM   CARE MANAGEMENT NOTE 07/03/2012  Patient:  EMMAGRACE, RUNKEL   Account Number:  [id number]  Date Initiated:  06/26/2012  Documentation initiated by:  [facility]  Subjective/Objective Assessment:   Post op CABG x2.     Action/Plan:   PTA, PT RESIDED AT HOME ALONE. WILL NEED ST-SNF AT DISCHARGE FOR REHAB. WILL CONSULT CSW TO FACILITATE DC TO SNF WHEN MEDICALLY STABLE.   Anticipated DC Date:  07/04/2012   Anticipated DC Plan:  SKILLED NURSING FACILITY  In-house referral  Clinical Social Worker      DC Planning Services  CM consult      Choice offered to / List presented to:             Status of service:  In process, will continue to follow Medicare Important Message given?   (If response is "NO", the following Medicare IM given date fields will be blank) Date Medicare IM given:   Date Additional Medicare IM given:    Discharge Disposition:  SKILLED NURSING FACILITY  Per UR Regulation:  Reviewed for med. necessity/level of care/duration of stay  If discussed at Long Length of Stay Meetings, dates discussed:    Comments:  Contact:  [name],[name] Sister [phone]  06-27-12 9:30am Avie Arenas, RNBSN [phone] Patient sitting up in chair - looks good.  Confirmed lives at home alone - agreeable to Rehab facility for ST on discharge.  Referral made to SW.

## 2012-06-27 NOTE — Progress Notes (Signed)
POD # 2 AVR, CABG  Stable day  Ambulating  BP 145/71  Pulse 75  Temp(Src) 98.5 F (36.9 C) (Oral)  Resp 20  Ht 5\' 3"  (1.6 m)  Wt 195 lb 15.8 oz (88.9 kg)  BMI 34.73 kg/m2  SpO2 96%   Intake/Output Summary (Last 24 hours) at 06/27/12 1657 Last data filed at 06/27/12 1600  Gross per 24 hour  Intake 1928.18 ml  Output   1330 ml  Net 598.18 ml    Continue present care

## 2012-06-27 NOTE — Progress Notes (Signed)
Clinical Social Work Department BRIEF PSYCHOSOCIAL ASSESSMENT 06/27/2012  Patient:  [name], [name]     Account Number:  [id number]     Admit date:  06/25/2012  Clinical Social Worker:  Madaline Guthrie  Date/Time:  06/27/2012 02:50 PM  Referred by:  Care Management  Date Referred:  06/27/2012 Referred for  SNF Placement   Other Referral:   Interview type:  Family Other interview type:    PSYCHOSOCIAL DATA Living Status:  ALONE Admitted from facility:   Level of care:   Primary support name:  Glenice Bow Primary support relationship to patient:  SIBLING Degree of support available:   Lives close by and is willing to help pt in any way possible    CURRENT CONCERNS Current Concerns  None Noted   Other Concerns:    SOCIAL WORK ASSESSMENT / PLAN CSW intern met with pt who was visiting with family members. Pts son who lives in [place] and nephew who lives in [place] came into town to visit. Pt previously lived at home alone prior to being admitted to the hospital. Pt states she was very independent and wants to return to her independent lifestyle. CSW intern explained SNF process and will return to talk with pt once bed offers are received. Pt states " I want to go to a nice nursing home that is close to home but not one where everybody is dying".   Assessment/plan status:  Other - See comment Other assessment/ plan:   SNF placement   Information/referral to community resources:    PATIENT'S/FAMILY'S RESPONSE TO PLAN OF CARE: Pt and family are onboard with SNF but patient wants to attend one close to home. She says she understands that she must go to SNF before she can return home.

## 2012-06-27 NOTE — Progress Notes (Signed)
Inpatient Diabetes Program Recommendations  AACE/ADA: New Consensus Statement on Inpatient Glycemic Control (2013)  Target Ranges:  Prepandial:   less than 140 mg/dL      Peak postprandial:   less than 180 mg/dL (1-2 hours)      Critically ill patients:  140 - 180 mg/dL   Reason for Visit: Results for [name], [name] (MRN [id number]) as of 06/27/2012 14:53  Ref. Range 06/27/2012 03:26 06/27/2012 06:03 06/27/2012 07:13 06/27/2012 11:20  Glucose-Capillary Latest Range: 70-99 mg/dL 93 99 308 (H) 657 (H)   May consider adding Novolog 3 units tid with meals to cover meal intake (hold if patient eats less than 50%).  Will follow.

## 2012-06-28 ENCOUNTER — Inpatient Hospital Stay (HOSPITAL_COMMUNITY): Payer: Medicare Other

## 2012-06-28 LAB — BASIC METABOLIC PANEL
CO2: 27 mEq/L (ref 19–32)
Chloride: 103 mEq/L (ref 96–112)
GFR calc Af Amer: 64 mL/min — ABNORMAL LOW (ref >90)
Potassium: 4.2 mEq/L (ref 3.5–5.1)
Sodium: 136 mEq/L (ref 135–145)

## 2012-06-28 LAB — GLUCOSE, CAPILLARY
Glucose-Capillary: 68 mg/dL — ABNORMAL LOW (ref 70–99)
Glucose-Capillary: 93 mg/dL (ref 70–99)
Glucose-Capillary: 100 mg/dL — ABNORMAL HIGH (ref 70–99)
Glucose-Capillary: 184 mg/dL — ABNORMAL HIGH (ref 70–99)
Glucose-Capillary: 173 mg/dL — ABNORMAL HIGH (ref 70–99)
Glucose-Capillary: 192 mg/dL — ABNORMAL HIGH (ref 70–99)
Glucose-Capillary: 96 mg/dL (ref 70–99)

## 2012-06-28 LAB — CBC
HCT: 22.8 % — ABNORMAL LOW (ref 36.0–46.0)
Hemoglobin: 7.9 g/dL — ABNORMAL LOW (ref 12.0–15.0)
MCV: 89.1 fL (ref 78.0–100.0)
RBC: 2.56 MIL/uL — ABNORMAL LOW (ref 3.87–5.11)
RDW: 15.5 % (ref 11.5–15.5)
WBC: 13.1 10*3/uL — ABNORMAL HIGH (ref 4.0–10.5)

## 2012-06-28 MED ORDER — PANTOPRAZOLE SODIUM 40 MG PO TBEC
40.0000 mg | DELAYED_RELEASE_TABLET | Freq: Every day | ORAL | Status: DC
  Administered 2012-06-29 – 2012-07-03 (×5): 40 mg via ORAL
  Filled 2012-06-28 (×6): qty 1

## 2012-06-28 MED ORDER — SODIUM CHLORIDE 0.9 % IJ SOLN
3.0000 mL | Freq: Two times a day (BID) | INTRAMUSCULAR | Status: DC
  Administered 2012-06-28 – 2012-07-03 (×8): 3 mL via INTRAVENOUS

## 2012-06-28 MED ORDER — MOVING RIGHT ALONG BOOK
Freq: Once | Status: AC
  Administered 2012-06-28: 08:00:00
  Filled 2012-06-28: qty 1

## 2012-06-28 MED ORDER — METFORMIN HCL ER 750 MG PO TB24
750.0000 mg | ORAL_TABLET | Freq: Two times a day (BID) | ORAL | Status: DC
  Administered 2012-06-28 – 2012-07-03 (×11): 750 mg via ORAL
  Filled 2012-06-28 (×14): qty 1

## 2012-06-28 MED ORDER — SODIUM CHLORIDE 0.9 % IJ SOLN: 3.0000 mL | INTRAMUSCULAR | Status: DC | PRN

## 2012-06-28 MED ORDER — ONDANSETRON HCL 4 MG/2ML IJ SOLN: 4.0000 mg | Freq: Four times a day (QID) | INTRAMUSCULAR | Status: DC | PRN

## 2012-06-28 MED ORDER — ONDANSETRON HCL 4 MG PO TABS: 4.0000 mg | ORAL_TABLET | Freq: Four times a day (QID) | ORAL | Status: DC | PRN

## 2012-06-28 MED ORDER — HYDROCHLOROTHIAZIDE 12.5 MG PO CAPS
12.5000 mg | ORAL_CAPSULE | Freq: Every day | ORAL | Status: DC
  Administered 2012-06-28 – 2012-06-29 (×2): 12.5 mg via ORAL
  Filled 2012-06-28 (×2): qty 1

## 2012-06-28 MED ORDER — TRAMADOL HCL 50 MG PO TABS
50.0000 mg | ORAL_TABLET | ORAL | Status: DC | PRN
  Administered 2012-07-02 – 2012-07-03 (×2): 100 mg via ORAL
  Filled 2012-06-28 (×2): qty 2

## 2012-06-28 MED ORDER — CARVEDILOL 12.5 MG PO TABS
12.5000 mg | ORAL_TABLET | Freq: Two times a day (BID) | ORAL | Status: DC
  Administered 2012-06-28 – 2012-06-30 (×5): 12.5 mg via ORAL
  Filled 2012-06-28 (×7): qty 1

## 2012-06-28 MED ORDER — INSULIN ASPART 100 UNIT/ML ~~LOC~~ SOLN
0.0000 [IU] | Freq: Three times a day (TID) | SUBCUTANEOUS | Status: DC
  Administered 2012-06-28 – 2012-06-29 (×3): 4 [IU] via SUBCUTANEOUS
  Administered 2012-06-30 (×2): 2 [IU] via SUBCUTANEOUS
  Administered 2012-07-01: 4 [IU] via SUBCUTANEOUS
  Administered 2012-07-01: 2 [IU] via SUBCUTANEOUS
  Administered 2012-07-03: 4 [IU] via SUBCUTANEOUS

## 2012-06-28 MED ORDER — ASPIRIN EC 325 MG PO TBEC
325.0000 mg | DELAYED_RELEASE_TABLET | Freq: Every day | ORAL | Status: DC
  Administered 2012-06-28 – 2012-07-03 (×6): 325 mg via ORAL
  Filled 2012-06-28 (×6): qty 1

## 2012-06-28 MED ORDER — DOCUSATE SODIUM 100 MG PO CAPS
200.0000 mg | ORAL_CAPSULE | Freq: Every day | ORAL | Status: DC
  Administered 2012-06-28 – 2012-07-03 (×6): 200 mg via ORAL
  Filled 2012-06-28 (×6): qty 2

## 2012-06-28 MED ORDER — BISACODYL 10 MG RE SUPP: 10.0000 mg | Freq: Every day | RECTAL | Status: DC | PRN

## 2012-06-28 MED ORDER — BISACODYL 5 MG PO TBEC: 10.0000 mg | DELAYED_RELEASE_TABLET | Freq: Every day | ORAL | Status: DC | PRN

## 2012-06-28 MED ORDER — INSULIN ASPART 100 UNIT/ML ~~LOC~~ SOLN
3.0000 [IU] | Freq: Three times a day (TID) | SUBCUTANEOUS | Status: DC
  Administered 2012-06-28 – 2012-06-29 (×3): 3 [IU] via SUBCUTANEOUS

## 2012-06-28 MED ORDER — SODIUM CHLORIDE 0.9 % IV SOLN: 250.0000 mL | INTRAVENOUS | Status: DC | PRN

## 2012-06-28 MED ORDER — OXYCODONE HCL 5 MG PO TABS
5.0000 mg | ORAL_TABLET | ORAL | Status: DC | PRN
  Administered 2012-06-28 – 2012-06-30 (×2): 10 mg via ORAL
  Filled 2012-06-28 (×2): qty 2

## 2012-06-28 NOTE — Progress Notes (Signed)
4oz orange juice given because breakfast is >1hr away.  Will monitor pt.

## 2012-06-28 NOTE — Progress Notes (Signed)
Patient ID: [name], female   DOB: 1933-01-29, 79 y.o.   MRN: [id number] TCTS DAILY PROGRESS NOTE                   [address]            [place] [address]          [phone]      3 Days Post-Op Procedure(s) (LRB): AORTIC VALVE REPLACEMENT (AVR) (N/A) Coronary artery bypass graft  times two using left internal mammary artery and right leg greater saphenous vein (N/A) INTRAOPERATIVE TRANSESOPHAGEAL ECHOCARDIOGRAM (N/A)  Total Length of Stay:  LOS: 3 days   Subjective: Awake and alert, no complaints  Objective: Vital signs in last 24 hours: Temp:  [98.1 F (36.7 C)-98.5 F (36.9 C)] 98.3 F (36.8 C) (02/28 0730) Pulse Rate:  [68-85] 76 (02/28 0700) Cardiac Rhythm:  [-] Normal sinus rhythm (02/28 0400) Resp:  [15-23] 19 (02/28 0700) BP: (104-168)/(57-86) 153/80 mmHg (02/28 0700) SpO2:  [92 %-99 %] 99 % (02/28 0700) Weight:  [196 lb 13.9 oz (89.3 kg)] 196 lb 13.9 oz (89.3 kg) (02/28 0600)  Filed Weights   06/26/12 0500 06/27/12 0500 06/28/12 0600  Weight: 195 lb 8.8 oz (88.7 kg) 195 lb 15.8 oz (88.9 kg) 196 lb 13.9 oz (89.3 kg)    Weight change: 14.1 oz (0.4 kg)   Hemodynamic parameters for last 24 hours:    Intake/Output from previous day: 02/27 0701 - 02/28 0700 In: 1900 [P.O.:1440; I.V.:460] Out: 1115 [Urine:1065; Chest Tube:50]  Intake/Output this shift:    Current Meds: Scheduled Meds: . acetaminophen  1,000 mg Oral Q6H   Or  . acetaminophen (TYLENOL) oral liquid 160 mg/5 mL  975 mg Per Tube Q6H  . aspirin EC  325 mg Oral Daily   Or  . aspirin  324 mg Per Tube Daily  . atorvastatin  40 mg Oral q1800  . bisacodyl  10 mg Oral Daily   Or  . bisacodyl  10 mg Rectal Daily  . docusate sodium  200 mg Oral Daily  . enoxaparin  30 mg Subcutaneous QHS  . guaiFENesin  600 mg Oral BID  . insulin aspart  0-24 Units Subcutaneous Q4H  . insulin detemir  15 Units Subcutaneous Q1200  . levothyroxine  100 mcg Oral QAC breakfast  .  metoprolol tartrate  25 mg Oral BID   Or  . metoprolol tartrate  25 mg Per Tube BID  . multivitamin with minerals  1 tablet Oral Daily  . pantoprazole  40 mg Oral Daily  . sodium chloride  3 mL Intravenous Q12H   Continuous Infusions: . sodium chloride 20 mL/hr at 06/27/12 2000  . sodium chloride 20 mL/hr at 06/25/12 1500  . dexmedetomidine Stopped (06/25/12 1800)  . DOPamine Stopped (06/26/12 0730)  . lactated ringers Stopped (06/27/12 1400)  . nitroGLYCERIN Stopped (06/26/12 0745)  . phenylephrine (NEO-SYNEPHRINE) Adult infusion Stopped (06/26/12 0600)   PRN Meds:.sodium chloride, metoprolol, midazolam, morphine injection, ondansetron (ZOFRAN) IV, oxyCODONE, sodium chloride  General appearance: alert and cooperative Neurologic: intact Heart: regular rate and rhythm, S1, S2 normal, no murmur, click, rub or gallop and normal apical impulse Lungs: clear to auscultation bilaterally and normal percussion bilaterally Abdomen: soft, non-tender; bowel sounds normal; no masses,  no organomegaly Extremities: extremities normal, atraumatic, no cyanosis or edema and Homans sign is negative, no sign of DVT Wound: sternum stable  Lab Results: CBC: Recent Labs  06/27/12 0350 06/28/12 0400  WBC 11.5* 13.1*  HGB 8.2* 7.9*  HCT 23.1* 22.8*  PLT 111* 122*   BMET:  Recent Labs  06/27/12 0350 06/28/12 0400  NA 138 136  K 4.2 4.2  CL 105 103  CO2 25 27  GLUCOSE 105* 91  BUN 23 29*  CREATININE 1.13* 0.96  CALCIUM 8.0* 8.1*    PT/INR:  Recent Labs  06/25/12 1400  LABPROT 18.3*  INR 1.57*   Radiology: Dg Chest Port 1 View  06/28/2012  *RADIOLOGY REPORT*  Clinical Data: Postop tube check  PORTABLE CHEST - 1 VIEW  Comparison: Chest radiograph 06/27/2012  Findings: Sternotomy wires overlie stable cardiac silhouette. Interval removal of the left chest tube.  No pneumothorax.  There is persistent bibasilar atelectasis which is mild.  Right IJ sheath remains.  IMPRESSION: 1.  Removal  left chest tube without pneumothorax. 2.  Bibasilar atelectasis and small effusions.   Original Report Authenticated By: Genevive Bi, M.D.    Dg Chest Portable 1 View In Am  06/27/2012  *RADIOLOGY REPORT*  Clinical Data: Postop aortic valve replacement  PORTABLE CHEST - 1 VIEW  Comparison: Prior chest x-ray 06/26/2012  Findings: Interval removal of the Swan-Ganz catheter and subxiphoid approach mediastinal drain.  The right IJ vascular sheath remains in unchanged position with the tip in the mid superior vena cava. Left thoracostomy tube is in unchanged position.  Stable mild cardiomegaly.  Surgical changes of median sternotomy with evidence of prior CABG and mitral valve replacement.  Slightly increased bibasilar opacities and small bilateral effusions.  No pneumothorax.  Mild vascular congestion without edema.  IMPRESSION:  1.  Slightly enlarged bilateral pleural effusions and associated bibasilar atelectasis. 2.  Vascular congestion without overt edema. 3.  Interval removal of Swan-Ganz catheter and subxiphoid mediastinal drain.   Original Report Authenticated By: Malachy Moan, M.D.      Assessment/Plan: S/P Procedure(s) (LRB): AORTIC VALVE REPLACEMENT (AVR) (N/A) Coronary artery bypass graft  times two using left internal mammary artery and right leg greater saphenous vein (N/A) INTRAOPERATIVE TRANSESOPHAGEAL ECHOCARDIOGRAM (N/A) Mobilize Diuresis Diabetes control Plan for transfer to step-down: see transfer orders Expected Acute  Blood - loss Anemia and preop anemia    Elgene Coral B 06/28/2012 7:57 AM

## 2012-06-28 NOTE — Evaluation (Signed)
Physical Therapy Evaluation Patient Details Name: [name] MRN: [id number] DOB: 1933/02/19 Today's Date: 06/28/2012 Time: 7829-5621 PT Time Calculation (min): 25 min  PT Assessment / Plan / Recommendation Clinical Impression  Pt s/p AVR with decr mobility secondary to decr endurance and decr balance without RW .  Will benefit from PT to address endurance and balance issues.  Should progress and be able to go home with HHPT f/u.  May need SNF secondary to sternal precautions to keep pt from breaking the precautions with ADL's but most likely can go home with HHPT f/u.      PT Assessment  Patient needs continued PT services    Follow Up Recommendations  Home health PT;SNF;Other (comment) (depends on progress)                Equipment Recommendations  Rolling walker with 5" wheels;Other (comment) (3N1)         Frequency Min 3X/week    Precautions / Restrictions Precautions Precautions: Fall;Sternal Restrictions Weight Bearing Restrictions: No RUE Weight Bearing: Non weight bearing LUE Weight Bearing: Non weight bearing   Pertinent Vitals/Pain VSS, No pain       Mobility  Bed Mobility Bed Mobility: Rolling Left;Left Sidelying to Sit;Sitting - Scoot to [place] of Bed Rolling Left: 4: Min assist Left Sidelying to Sit: 4: Min assist;HOB flat Sitting - Scoot to Edge of Bed: 4: Min assist Details for Bed Mobility Assistance: cues and assist needed to maintain sternal precautions.  Assist needed for technique and sequencing. Transfers Transfers: Sit to Stand;Stand to Sit Sit to Stand: 4: Min assist;Without upper extremity assist;From chair/3-in-1;From bed Stand to Sit: 4: Min assist;Without upper extremity assist;To chair/3-in-1 Details for Transfer Assistance: Pt needed cues for hand placement for sternal precautions with each sit to stand.  Used pillow at times.   Ambulation/Gait Ambulation/Gait Assistance: 4: Min assist Ambulation Distance (Feet): 260 Feet Assistive  device: Rolling walker Ambulation/Gait Assistance Details: Pt ambulated with RW to bathroom and in hall.  Pt needed assist to steer RW.  Pt with good postural stability overall with RW.  Pt cleaned herself when getting off toilet.   Gait Pattern: Step-through pattern;Decreased stride length;Decreased step length - right;Decreased step length - left Gait velocity: decreased Stairs: No Wheelchair Mobility Wheelchair Mobility: No         PT Diagnosis: Generalized weakness  PT Problem List: Decreased activity tolerance;Decreased balance;Decreased mobility;Decreased safety awareness;Decreased knowledge of use of DME;Decreased knowledge of precautions;Obesity PT Treatment Interventions: DME instruction;Gait training;Functional mobility training;Stair training;Therapeutic activities;Therapeutic exercise;Balance training;Patient/family education   PT Goals Acute Rehab PT Goals PT Goal Formulation: With patient Time For Goal Achievement: 07/05/12 Potential to Achieve Goals: Good Pt will go Supine/Side to Sit: with modified independence PT Goal: Supine/Side to Sit - Progress: Goal set today Pt will go Sit to Stand: with modified independence;without upper extremity assist PT Goal: Sit to Stand - Progress: Goal set today Pt will Ambulate: 51 - 150 feet;with modified independence;with least restrictive assistive device PT Goal: Ambulate - Progress: Goal set today Pt will Go Up / Down Stairs: 1-2 stairs;with supervision;with least restrictive assistive device PT Goal: Up/Down Stairs - Progress: Goal set today Additional Goals Additional Goal #1: Pt to perform all mobility following sternal precautions.   PT Goal: Additional Goal #1 - Progress: Goal set today  Visit Information  Last PT Received On: 06/28/12 Assistance Needed: +1    Subjective Data  Subjective: "I feel better today." Patient Stated Goal: To go home  Prior Functioning  Home Living Lives With: Alone Available Help at  Discharge: Family;Available PRN/intermittently;Other (Comment) (sister checks in on pt) Type of Home: House Home Access: Stairs to enter [employer] of Steps: 2 Entrance Stairs-Rails: Right Home Layout: One level Bathroom Shower/Tub: Forensic psychologist: None Prior Function Level of Independence: Independent Able to Take Stairs?: Yes Driving: Yes Vocation: Retired Musician: No difficulties Dominant Hand: Right    Cognition  Cognition Overall Cognitive Status: Appears within functional limits for tasks assessed/performed Arousal/Alertness: Awake/alert Orientation Level: Appears intact for tasks assessed Behavior During Session: [facility] for tasks performed    Extremity/Trunk Assessment Right Lower Extremity Assessment RLE ROM/Strength/Tone: [facility] for tasks assessed Left Lower Extremity Assessment LLE ROM/Strength/Tone: [facility] for tasks assessed Trunk Assessment Trunk Assessment: Normal   Balance Static Standing Balance Static Standing - Balance Support: Bilateral upper extremity supported;During functional activity Static Standing - Level of Assistance: 5: Stand by assistance Static Standing - Comment/# of Minutes: can stand statically for 5 minutes with RW without LOB  End of Session PT - End of Session Equipment Utilized During Treatment: Gait belt Activity Tolerance: Patient tolerated treatment well Patient left: in chair;with call bell/phone within reach Nurse Communication: Mobility status       INGOLD,Rashun Grattan 06/28/2012, 11:24 AM  [facility] [phone] (pager)

## 2012-06-28 NOTE — Progress Notes (Signed)
Pt ambulated 150 feet pushing wheelchair; steady gait noted; pt ambulated RA; some dyspnea on exertion noted; pt assisted to chair after walk; call bell w/i reach; will cont. To monitor.

## 2012-06-28 NOTE — Progress Notes (Signed)
RA sats after ambulation 95%; will cont. To monitor.

## 2012-06-28 NOTE — Progress Notes (Signed)
0315-Pt cbg-68, Hypoglycemia protocol implemented and pt given 4 oz orange juice, repeat cbg-93 at 0330.  Will monitor pt.

## 2012-06-28 NOTE — Progress Notes (Signed)
Pt c/o seeing "floaters"'; BS checked = 192; HR 78; BP at this time 123/62; O2 sats RA 95%; will cont. To monitor.

## 2012-06-29 ENCOUNTER — Inpatient Hospital Stay (HOSPITAL_COMMUNITY): Payer: Medicare Other

## 2012-06-29 LAB — CBC
HCT: 22.2 % — ABNORMAL LOW (ref 36.0–46.0)
Hemoglobin: 7.4 g/dL — ABNORMAL LOW (ref 12.0–15.0)
MCH: 30.2 pg (ref 26.0–34.0)
MCHC: 33.3 g/dL (ref 30.0–36.0)
MCV: 90.6 fL (ref 78.0–100.0)
Platelets: 140 10*3/uL — ABNORMAL LOW (ref 150–400)
RBC: 2.45 MIL/uL — ABNORMAL LOW (ref 3.87–5.11)
RDW: 15 % (ref 11.5–15.5)
WBC: 10.5 10*3/uL (ref 4.0–10.5)

## 2012-06-29 LAB — TYPE AND SCREEN
ABO/RH(D): A POS
Antibody Screen: NEGATIVE
Unit division: 0
Unit division: 0
Unit division: 0
Unit division: 0

## 2012-06-29 LAB — BASIC METABOLIC PANEL
BUN: 24 mg/dL — ABNORMAL HIGH (ref 6–23)
CO2: 30 mEq/L (ref 19–32)
Calcium: 8.2 mg/dL — ABNORMAL LOW (ref 8.4–10.5)
Chloride: 103 mEq/L (ref 96–112)
Creatinine, Ser: 0.93 mg/dL (ref 0.50–1.10)
GFR calc Af Amer: 67 mL/min — ABNORMAL LOW (ref >90)
GFR calc non Af Amer: 58 mL/min — ABNORMAL LOW (ref >90)
Glucose, Bld: 104 mg/dL — ABNORMAL HIGH (ref 70–99)
Potassium: 4.2 mEq/L (ref 3.5–5.1)
Sodium: 138 mEq/L (ref 135–145)

## 2012-06-29 LAB — GLUCOSE, CAPILLARY
Glucose-Capillary: 102 mg/dL — ABNORMAL HIGH (ref 70–99)
Glucose-Capillary: 102 mg/dL — ABNORMAL HIGH (ref 70–99)
Glucose-Capillary: 166 mg/dL — ABNORMAL HIGH (ref 70–99)
Glucose-Capillary: 195 mg/dL — ABNORMAL HIGH (ref 70–99)

## 2012-06-29 MED ORDER — INSULIN ASPART 100 UNIT/ML ~~LOC~~ SOLN
5.0000 [IU] | Freq: Three times a day (TID) | SUBCUTANEOUS | Status: DC
  Administered 2012-06-29 – 2012-06-30 (×2): 5 [IU] via SUBCUTANEOUS

## 2012-06-29 MED ORDER — FUROSEMIDE 40 MG PO TABS
40.0000 mg | ORAL_TABLET | Freq: Two times a day (BID) | ORAL | Status: DC
  Administered 2012-06-30 – 2012-07-03 (×7): 40 mg via ORAL
  Filled 2012-06-29 (×9): qty 1

## 2012-06-29 MED ORDER — FUROSEMIDE 10 MG/ML IJ SOLN
40.0000 mg | Freq: Two times a day (BID) | INTRAMUSCULAR | Status: AC
  Administered 2012-06-29: 40 mg via INTRAVENOUS
  Filled 2012-06-29: qty 4

## 2012-06-29 MED ORDER — ALBUTEROL SULFATE (5 MG/ML) 0.5% IN NEBU
2.5000 mg | INHALATION_SOLUTION | Freq: Four times a day (QID) | RESPIRATORY_TRACT | Status: DC | PRN
  Administered 2012-06-30: 2.5 mg via RESPIRATORY_TRACT
  Filled 2012-06-29: qty 0.5

## 2012-06-29 MED ORDER — ALBUTEROL SULFATE (5 MG/ML) 0.5% IN NEBU: 2.5000 mg | INHALATION_SOLUTION | Freq: Four times a day (QID) | RESPIRATORY_TRACT | Status: DC

## 2012-06-29 MED ORDER — BENAZEPRIL HCL 20 MG PO TABS
20.0000 mg | ORAL_TABLET | Freq: Every day | ORAL | Status: DC
  Administered 2012-06-29 – 2012-07-01 (×3): 20 mg via ORAL
  Filled 2012-06-29 (×4): qty 1

## 2012-06-29 MED ORDER — POTASSIUM CHLORIDE CRYS ER 20 MEQ PO TBCR
20.0000 meq | EXTENDED_RELEASE_TABLET | Freq: Two times a day (BID) | ORAL | Status: DC
  Administered 2012-06-30 – 2012-07-03 (×6): 20 meq via ORAL
  Filled 2012-06-29 (×10): qty 1

## 2012-06-29 MED ORDER — INSULIN DETEMIR 100 UNIT/ML ~~LOC~~ SOLN
18.0000 [IU] | Freq: Every day | SUBCUTANEOUS | Status: DC
  Administered 2012-06-29 – 2012-07-03 (×5): 18 [IU] via SUBCUTANEOUS

## 2012-06-29 MED ORDER — POLYSACCHARIDE IRON COMPLEX 150 MG PO CAPS
150.0000 mg | ORAL_CAPSULE | Freq: Every day | ORAL | Status: DC
  Administered 2012-06-29 – 2012-07-03 (×4): 150 mg via ORAL
  Filled 2012-06-29 (×5): qty 1

## 2012-06-29 MED ORDER — POTASSIUM CHLORIDE CRYS ER 20 MEQ PO TBCR
20.0000 meq | EXTENDED_RELEASE_TABLET | Freq: Every day | ORAL | Status: DC
  Administered 2012-06-29: 20 meq via ORAL
  Filled 2012-06-29: qty 1

## 2012-06-29 MED ORDER — FUROSEMIDE 40 MG PO TABS
40.0000 mg | ORAL_TABLET | Freq: Every day | ORAL | Status: DC
  Administered 2012-06-29: 40 mg via ORAL
  Filled 2012-06-29: qty 1

## 2012-06-29 NOTE — Progress Notes (Signed)
Pt CBG's 102 this morning x's 2. Order to hold novolog per [place], [place]. Will continue to monitor.

## 2012-06-29 NOTE — Progress Notes (Signed)
Returned to ambulate and pt was asleep. Nurse stated that she is doing well and has been ambulating around the room. Pt will walk with nurses later in the day.  Blima Dessert, MS, ACSM-CES

## 2012-06-29 NOTE — Progress Notes (Signed)
Pt refuses to walk, states she will attempt tomorrow. Will continue to monitor.

## 2012-06-29 NOTE — Progress Notes (Signed)
Educated pt on reason for IV Lasix. Pt refuses and states "maybe tomorrow". Will continue to monitor.

## 2012-06-29 NOTE — Progress Notes (Signed)
Pt stated that she was feeling very tired and was experiencing SOB, and asked if she could walk post-lunch. Will f/u in a few hours to ambulate.  Jody Dessert, MS, ACSM-CES

## 2012-06-29 NOTE — Progress Notes (Addendum)
[address]            [place] [address]          [phone]     4 Days Post-Op Procedure(s) (LRB): AORTIC VALVE REPLACEMENT (AVR) (N/A) Coronary artery bypass graft  times two using left internal mammary artery and right leg greater saphenous vein (N/A) INTRAOPERATIVE TRANSESOPHAGEAL ECHOCARDIOGRAM (N/A)  Subjective: Had an episode of coughing and wheezing last night, but breathing better this am.    Objective: Vital signs in last 24 hours: Patient Vitals for the past 24 hrs:  BP Temp Temp src Pulse Resp SpO2 Weight  06/29/12 0813 147/64 mmHg - - 85 - - -  06/29/12 0338 - - - - - - 194 lb 1.6 oz (88.043 kg)  06/29/12 0329 146/88 mmHg 98.8 F (37.1 C) Oral 74 20 100 % -  06/28/12 1938 136/84 mmHg 98.8 F (37.1 C) Oral 77 20 100 % -  06/28/12 1655 137/58 mmHg - - 80 - - -  06/28/12 1546 - - - - - 95 % -  06/28/12 1536 123/62 mmHg - - 77 - 94 % -  06/28/12 1416 139/69 mmHg 98.2 F (36.8 C) Oral 78 22 96 % -  06/28/12 1200 109/96 mmHg - - 73 24 100 % -  06/28/12 1141 - 98.6 F (37 C) Oral - - - -  06/28/12 1047 143/67 mmHg - - 82 - 99 % -  06/28/12 1000 143/67 mmHg - - 80 21 98 % -   Current Weight  06/29/12 194 lb 1.6 oz (88.043 kg)  PRE OP WEIGHT: 83 kg    Intake/Output from previous day: 02/28 0701 - 03/01 0700 In: 980 [P.O.:960; I.V.:20] Out: 1275 [Urine:1275]  CBGs 192-173-96-104-102     PHYSICAL EXAM:  Heart: RRR Lungs: diminished BS in bases, no wheezing noted Wound: Sternal wound clean and dry, right thigh wounds with serosanguinous drainage, no erythema Extremities: +LE edema    Lab Results: CBC: Recent Labs  06/28/12 0400 06/29/12 0435  WBC 13.1* 10.5  HGB 7.9* 7.4*  HCT 22.8* 22.2*  PLT 122* 140*   BMET:  Recent Labs  06/28/12 0400 06/29/12 0435  NA 136 138  K 4.2 4.2  CL 103 103  CO2 27 30  GLUCOSE 91 104*  BUN 29* 24*  CREATININE 0.96 0.93  CALCIUM 8.1* 8.2*    PT/INR: No results  found for this basename: LABPROT, INR,  in the last 72 hours    CXR: Findings: Grossly unchanged enlarged cardiac silhouette and  mediastinal contours post median sternotomy and CABG and aortic  valve replacement. Interval removal of right jugular approach  central venous catheter. Grossly unchanged small bilateral pleural  effusions and bibasilar opacities. Mild cephalization of flow  without frank evidence of edema. Unchanged bones.  IMPRESSION:  1. Interval removal of support apparatus. No pneumothorax.  2. Grossly unchanged findings of mild pulmonary venous congestion,  small bilateral pleural effusions and bibasilar atelectasis.    Assessment/Plan: S/P Procedure(s) (LRB): AORTIC VALVE REPLACEMENT (AVR) (N/A) Coronary artery bypass graft  times two using left internal mammary artery and right leg greater saphenous vein (N/A) INTRAOPERATIVE TRANSESOPHAGEAL ECHOCARDIOGRAM (N/A)  CV- SR, BPs trending up. Continue Coreg, will resume low dose ACE-I as Cr stable.  Vol overload- continue diuresis.  DM- sugars starting to trend up.  Will slowly titrate insulins toward home doses.  Back on po meds.  Expected postop blood loss anemia- H/H stable.  Start Fe.  Pulm toilet, ambulation.   LOS: 4 days    COLLINS,GINA H 06/29/2012  I have seen and examined the patient and agree with the assessment and plan as outlined.  Patient looks volume overloaded and c/o increased dyspnea even at rest.  Bilateral rales on exam.  Anemia w/ Hgb down to 7.4.  Will give lasix IV today and increase to bid dosing.  Hold HCTZ while on lasix.  Consider transfusion if Hgb drops further and patient remains symptomatic.  Abdoul Encinas H 06/29/2012 11:30 AM

## 2012-06-30 LAB — GLUCOSE, CAPILLARY
Glucose-Capillary: 35 mg/dL — CL (ref 70–99)
Glucose-Capillary: 89 mg/dL (ref 70–99)
Glucose-Capillary: 131 mg/dL — ABNORMAL HIGH (ref 70–99)
Glucose-Capillary: 164 mg/dL — ABNORMAL HIGH (ref 70–99)
Glucose-Capillary: 127 mg/dL — ABNORMAL HIGH (ref 70–99)
Glucose-Capillary: 70 mg/dL (ref 70–99)
Glucose-Capillary: 134 mg/dL — ABNORMAL HIGH (ref 70–99)
Glucose-Capillary: 94 mg/dL (ref 70–99)

## 2012-06-30 LAB — BASIC METABOLIC PANEL
BUN: 22 mg/dL (ref 6–23)
CO2: 33 mEq/L — ABNORMAL HIGH (ref 19–32)
Chloride: 99 mEq/L (ref 96–112)
Glucose, Bld: 183 mg/dL — ABNORMAL HIGH (ref 70–99)
Potassium: 4 mEq/L (ref 3.5–5.1)

## 2012-06-30 LAB — CBC
MCV: 90.8 fL (ref 78.0–100.0)
Platelets: 171 10*3/uL (ref 150–400)
RBC: 2.6 MIL/uL — ABNORMAL LOW (ref 3.87–5.11)
WBC: 9.3 10*3/uL (ref 4.0–10.5)

## 2012-06-30 MED ORDER — INSULIN ASPART 100 UNIT/ML ~~LOC~~ SOLN
3.0000 [IU] | Freq: Three times a day (TID) | SUBCUTANEOUS | Status: DC
  Administered 2012-06-30: 3 [IU] via SUBCUTANEOUS
  Administered 2012-07-02: 13:00:00 via SUBCUTANEOUS
  Administered 2012-07-03: 3 [IU] via SUBCUTANEOUS

## 2012-06-30 MED ORDER — AMIODARONE HCL 200 MG PO TABS
400.0000 mg | ORAL_TABLET | Freq: Every day | ORAL | Status: DC
  Filled 2012-06-30: qty 2

## 2012-06-30 MED ORDER — CARVEDILOL 25 MG PO TABS
25.0000 mg | ORAL_TABLET | Freq: Two times a day (BID) | ORAL | Status: DC
  Administered 2012-06-30 – 2012-07-03 (×6): 25 mg via ORAL
  Filled 2012-06-30 (×8): qty 1

## 2012-06-30 MED ORDER — AMIODARONE IV BOLUS ONLY 150 MG/100ML
150.0000 mg | Freq: Once | INTRAVENOUS | Status: DC
  Filled 2012-06-30: qty 100

## 2012-06-30 MED ORDER — AMIODARONE HCL 200 MG PO TABS
400.0000 mg | ORAL_TABLET | Freq: Two times a day (BID) | ORAL | Status: DC
  Administered 2012-06-30 – 2012-07-03 (×7): 400 mg via ORAL
  Filled 2012-06-30 (×9): qty 2

## 2012-06-30 NOTE — Progress Notes (Signed)
Pt ambulated 90 ft in hallway, HR increased to 150's, sustained, pt stated she felt weak and needed to sit. Pt resting in wheel chair, converted from SR to A fib. Pt assisted back to bed, VSS, pt resting, HR sustaining in 90's to low 100's at rest. [place], PA paged, will continue to monitor closely.

## 2012-06-30 NOTE — Progress Notes (Signed)
Pt converted to SR without Amio bolus. Pt resting in bed, will continue to monitor closely.

## 2012-06-30 NOTE — Progress Notes (Signed)
Pt in and out of NSR and A Fib, PO amio given at 1353. PA notified, no new orders given. Will continue to monitor pt closely.

## 2012-06-30 NOTE — Progress Notes (Addendum)
                    [address]            [place] [address]          [phone]     5 Days Post-Op Procedure(s) (LRB): AORTIC VALVE REPLACEMENT (AVR) (N/A) Coronary artery bypass graft  times two using left internal mammary artery and right leg greater saphenous vein (N/A) INTRAOPERATIVE TRANSESOPHAGEAL ECHOCARDIOGRAM (N/A)  Subjective: Went into AF while out walking this am, rates 140-150s initially, but now 80-90s.  She was asymptomatic.  She also had hypoglycemia early this am, which was symptomatic.  Appetite marginal.   Objective: Vital signs in last 24 hours: Patient Vitals for the past 24 hrs:  BP Temp Temp src Pulse Resp SpO2 Weight  06/30/12 0842 131/54 mmHg - - 81 - - -  06/30/12 0633 131/69 mmHg 98.3 F (36.8 C) - 80 18 100 % -  06/30/12 0500 - - - - - - 190 lb 2.2 oz (86.247 kg)  06/30/12 0126 - - - - - 100 % -  06/30/12 0125 - - - - - 100 % -  06/29/12 1927 153/64 mmHg 99 F (37.2 C) Oral 79 20 100 % -  06/29/12 1830 152/70 mmHg - - 80 - - -  06/29/12 1300 141/74 mmHg 98.3 F (36.8 C) Oral 80 18 100 % -  06/29/12 1043 138/63 mmHg - - 74 - - -   Current Weight  06/30/12 190 lb 2.2 oz (86.247 kg)  PRE OP WEIGHT: 83 kg    Intake/Output from previous day: 03/01 0701 - 03/02 0700 In: 600 [P.O.:600] Out: 2050 [Urine:2050]  CBGs 195-35-89-183-131-164    PHYSICAL EXAM:  Heart: Irr irr Lungs: Clear  Wound: Clean and dry Extremities: 1+ LE edema    Lab Results: CBC: Recent Labs  06/29/12 0435 06/30/12 0544  WBC 10.5 9.3  HGB 7.4* 8.0*  HCT 22.2* 23.6*  PLT 140* 171   BMET:  Recent Labs  06/29/12 0435 06/30/12 0544  NA 138 137  K 4.2 4.0  CL 103 99  CO2 30 33*  GLUCOSE 104* 183*  BUN 24* 22  CREATININE 0.93 0.92  CALCIUM 8.2* 8.7    PT/INR: No results found for this basename: LABPROT, INR,  in the last 72 hours    Assessment/Plan: S/P Procedure(s) (LRB): AORTIC VALVE REPLACEMENT (AVR) (N/A) Coronary artery  bypass graft  times two using left internal mammary artery and right leg greater saphenous vein (N/A) INTRAOPERATIVE TRANSESOPHAGEAL ECHOCARDIOGRAM (N/A)  CV- AF this am, now rate controlled.  BPs have been elevated, but currently SBP is low 100s.  Will increase Coreg and watch.  May need to add po Amio .  DM- hypoglycemic episode last nigh.  Will adjust insulin.  Continue po meds.  Vol overload- diuresing better after IV Lasix.  Will resume po.   Expected postop blood loss anemia- H/H improved. Continue Fe.  CRPI, pulm toilet.    LOS: 5 days    COLLINS,GINA H 06/30/2012  I have seen and examined the patient and agree with the assessment and plan as outlined.  Staying in Afib.  Will start amiodarone.  OWEN,CLARENCE H 06/30/2012 11:43 AM

## 2012-07-01 ENCOUNTER — Inpatient Hospital Stay (HOSPITAL_COMMUNITY): Payer: Medicare Other

## 2012-07-01 ENCOUNTER — Other Ambulatory Visit: Payer: Self-pay | Admitting: *Deleted

## 2012-07-01 LAB — GLUCOSE, CAPILLARY
Glucose-Capillary: 79 mg/dL (ref 70–99)
Glucose-Capillary: 141 mg/dL — ABNORMAL HIGH (ref 70–99)
Glucose-Capillary: 180 mg/dL — ABNORMAL HIGH (ref 70–99)
Glucose-Capillary: 131 mg/dL — ABNORMAL HIGH (ref 70–99)

## 2012-07-01 LAB — BASIC METABOLIC PANEL
CO2: 34 mEq/L — ABNORMAL HIGH (ref 19–32)
Calcium: 9 mg/dL (ref 8.4–10.5)
Chloride: 99 mEq/L (ref 96–112)
Creatinine, Ser: 1.12 mg/dL — ABNORMAL HIGH (ref 0.50–1.10)
Glucose, Bld: 86 mg/dL (ref 70–99)

## 2012-07-01 LAB — CBC
Hemoglobin: 8.1 g/dL — ABNORMAL LOW (ref 12.0–15.0)
MCH: 30.2 pg (ref 26.0–34.0)
MCV: 91.8 fL (ref 78.0–100.0)
RBC: 2.68 MIL/uL — ABNORMAL LOW (ref 3.87–5.11)
WBC: 6.8 10*3/uL (ref 4.0–10.5)

## 2012-07-01 MED ORDER — BENAZEPRIL HCL 20 MG PO TABS: 20.0000 mg | ORAL_TABLET | Freq: Every day | ORAL | Status: DC

## 2012-07-01 MED ORDER — POLYSACCHARIDE IRON COMPLEX 150 MG PO CAPS: 150.0000 mg | ORAL_CAPSULE | Freq: Every day | ORAL | Status: DC

## 2012-07-01 MED ORDER — POTASSIUM CHLORIDE CRYS ER 20 MEQ PO TBCR: 20.0000 meq | EXTENDED_RELEASE_TABLET | Freq: Every day | ORAL | Status: DC

## 2012-07-01 MED ORDER — OXYCODONE HCL 5 MG PO TABS: 5.0000 mg | ORAL_TABLET | ORAL | Status: DC | PRN

## 2012-07-01 MED ORDER — CARVEDILOL 25 MG PO TABS: 25.0000 mg | ORAL_TABLET | Freq: Two times a day (BID) | ORAL | Status: DC

## 2012-07-01 MED ORDER — AMIODARONE HCL 400 MG PO TABS: ORAL_TABLET | ORAL | Status: DC

## 2012-07-01 MED ORDER — ASPIRIN 325 MG PO TBEC: 325.0000 mg | DELAYED_RELEASE_TABLET | Freq: Every day | ORAL | Status: DC

## 2012-07-01 MED ORDER — LACTULOSE 10 GM/15ML PO SOLN
20.0000 g | Freq: Once | ORAL | Status: AC
  Administered 2012-07-01: 20 g via ORAL
  Filled 2012-07-01: qty 30

## 2012-07-01 MED ORDER — FUROSEMIDE 40 MG PO TABS: 40.0000 mg | ORAL_TABLET | Freq: Every day | ORAL | Status: DC

## 2012-07-01 NOTE — Progress Notes (Signed)
CARDIAC REHAB PHASE I   PRE:  Rate/Rhythm: 75SR  BP:  Supine:   Sitting: 145/88  Standing:    SaO2: 94%RA  MODE:  Ambulation: 150 ft   POST:  Rate/Rhythem: 81SR  BP:  Supine: 129/65  Sitting:   Standing:    SaO2: 97%RA 1105-1137 Pt resting in bed and asked me to wait 30 mins before coming back which I did. Assisted pt to bathroom for small BM and then she walked 150 ft with rolling walker and asst x 1. Tired easily. Could not get pt to go farther. Stated she would go farther this pm. To bed after walk. Tolerated well.  Duanne Limerick

## 2012-07-01 NOTE — Progress Notes (Addendum)
                   [address]            [place] [address]          [phone]      6 Days Post-Op Procedure(s) (LRB): AORTIC VALVE REPLACEMENT (AVR) (N/A) Coronary artery bypass graft  times two using left internal mammary artery and right leg greater saphenous vein (N/A) INTRAOPERATIVE TRANSESOPHAGEAL ECHOCARDIOGRAM (N/A)  Subjective: Patient passing flatus but no bowel movement yet.  Objective: Vital signs in last 24 hours: Temp:  [98.5 F (36.9 C)-98.7 F (37.1 C)] 98.7 F (37.1 C) (03/03 0419) Pulse Rate:  [75-87] 75 (03/03 0419) Cardiac Rhythm:  [-] Atrial fibrillation;Normal sinus rhythm;Atrial flutter (03/02 2027) Resp:  [16-20] 20 (03/03 0419) BP: (96-131)/(43-72) 128/70 mmHg (03/03 0419) SpO2:  [95 %-100 %] 95 % (03/03 0419) Weight:  [85.322 kg (188 lb 1.6 oz)] 85.322 kg (188 lb 1.6 oz) (03/03 0419)  Pre op weight  83 kg Current Weight  07/01/12 85.322 kg (188 lb 1.6 oz)      Intake/Output from previous day: 03/02 0701 - 03/03 0700 In: 723 [P.O.:720; I.V.:3] Out: 1150 [Urine:1150]   Physical Exam: BP 128/70  Pulse 75  Temp(Src) 98.7 F (37.1 C) (Oral)  Resp 20  Ht 5\' 3"  (1.6 m)  Wt 188 lb 1.6 oz (85.322 kg)  BMI 33.33 kg/m2  SpO2 95%  Cardiovascular: RRR Pulmonary: Diminished at bases; no rales, wheezes, or rhonchi. Abdomen: Soft, non tender, bowel sounds present. Extremities: Mild bilateral lower extremity edema. Wounds: Clean and dry.  No erythema or signs of infection.  Lab Results: CBC: Recent Labs  06/30/12 0544 07/01/12 0555  WBC 9.3 6.8  HGB 8.0* 8.1*  HCT 23.6* 24.6*  PLT 171 192   BMET:  Recent Labs  06/30/12 0544 07/01/12 0555  NA 137 138  K 4.0 4.1  CL 99 99  CO2 33* 34*  GLUCOSE 183* 86  BUN 22 24*  CREATININE 0.92 1.12*  CALCIUM 8.7 9.0    PT/INR:  Lab Results  Component Value Date   INR 1.57* 06/25/2012   INR 0.94 06/21/2012   INR 1.1* 05/31/2012   ABG:  INR: Will add last result for  INR, ABG once components are confirmed Will add last 4 CBG results once components are confirmed  Assessment/Plan:  1. CV - Went into afib earlier this am. Given Amiodarone bolus. SR now.On Amiodarone 400 bid, Coreg 25 bid, and Lotensin 20 daily. 2.  Pulmonary - CXR appears to show no pneumothorax, mild pulmonary vascular congestion, bilateral pleural effusions and atelectasis.Encourage incentive spiromter 3. Volume Overload - On Lasix 40 daily 4.  Acute blood loss anemia - H and H stable at 8.1 and 24.6. Continue Nu Iron. 5.DM-CBGs 134/94/79. On Metformin 750 bid and Insulin. Pre op HGA1C 7.7. 6.LOC for constipation 7.Right lower extremity sutures need to remain for at least 12-14 days 8.When ready for discharge, will need SNF  ZIMMERMAN,DONIELLE MPA-C 07/01/2012,7:57 AM  I have seen and examined [name] and agree with the above assessment  and plan.  [name] MD Beeper [phone] Office [phone] 07/01/2012 1:03 PM

## 2012-07-01 NOTE — Progress Notes (Addendum)
PT Cancellation Note  Patient Details Name: [name] MRN: [id number] DOB: 04/18/1933   Cancelled Treatment:    Reason Eval/Treat Not Completed: Other (comment) (pt just walked with cardiac rehab and wants to rest ) Will follow.    Ralene Bathe Kistler 07/01/2012, 11:40 AM [phone]

## 2012-07-02 LAB — GLUCOSE, CAPILLARY
Glucose-Capillary: 97 mg/dL (ref 70–99)
Glucose-Capillary: 134 mg/dL — ABNORMAL HIGH (ref 70–99)
Glucose-Capillary: 109 mg/dL — ABNORMAL HIGH (ref 70–99)
Glucose-Capillary: 112 mg/dL — ABNORMAL HIGH (ref 70–99)

## 2012-07-02 MED ORDER — BENAZEPRIL HCL 20 MG PO TABS
20.0000 mg | ORAL_TABLET | Freq: Every day | ORAL | Status: DC
  Administered 2012-07-03: 20 mg via ORAL
  Filled 2012-07-02: qty 1

## 2012-07-02 NOTE — Progress Notes (Signed)
CARDIAC REHAB PHASE I   PRE:  Rate/Rhythm: 85SR  BP:  Supine: 96/56  Sitting:   Standing:    SaO2: 97%RA  MODE:  Ambulation: 340 ft   POST:  Rate/Rhythem: 79-82 SR  BP:  Supine:   Sitting: 120/60  Standing:    SaO2: 97%RA 0855-0927 Pt walked 340 ft with rolling walker on RA and asst x 1. Tolerated increase in distance well. To recliner after walk.Call bell in reach.  Duanne Limerick

## 2012-07-02 NOTE — Discharge Summary (Signed)
Physician Discharge Summary  Patient ID: [name] MRN: [id number] DOB/AGE: Jan 14, 1933 79 y.o.  Admit date: 06/25/2012 Discharge date: 07/03/2012  Admission Diagnoses: 1.Multivessel CAD (left main disease included) 2.Severe aortic stenosis 3.History of DM 4.History of hyperlipidemia 5.History of hypertension 6.History of hypothyroidism  Discharge Diagnoses:  1.Multivessel CAD (left main disease included) 2.Severe aortic stenosis 3.History of DM 4.History of hyperlipidemia 5.History of hypertension 6.History of hypothyroidism 7.ABL anemia   Procedure (s):  Coronary artery bypass grafting x2 with the left  internal mammary to the left anterior descending coronary artery, reverse saphenous vein graft to the circumflex coronary artery, and aortic valve replacement with a pericardial [employer] tissue valve, model #3300 TFX, serial [id number], 21 mm, and right thigh Endovein  harvesting.  History of Presenting Illness: This is an 79 year old diabetic female with known aortic stenosis. She has noted increasing shortness of breath with minimal exertion over the past one to 2 years. More recently, it has involved chest discomfort radiating to the neck associated with shortness of breath. She denies syncope or presyncope. She noted mild pedal edema. She denied shortness of breath at rest but climbing as few as 12 stairs requires her to stop. Echocardiogram done in 2011 revealed aortic valve area of 1.03, peak gradient of 41 mm, mean gradient 25, and  maximum velocity 322 cm/s. In 2013, the aortic valve gradient dropped to 0.76, the peak velocity was 410 cm/s, with a mean gradient of 44, and a peak gradient of 67. In 2014, the peak gradient had increased to 85, mean gradient 49, with a velocity across the aortic valve of 460 cm/s. LV function has been preserved by echocardiogram as ventriculogram have not been done. She underwent a right and left heart catheterization by Dr. Elease [name] on  06/04/2012. She was found to have an 80% left main stenosis, severe aortic stenosis, and heavily calcified LAD.She was then seen by Dr. Tyrone [name] in the office for consideration of coronary artery bypass grafting surgery and aortic valve replacement surgery. Potential risks, complications, and benefits were discussed with the patient and she agreed to proceed. Pre op duplex carotid US showed no significant bilateral internal carotid artery disease.  Brief Hospital Course:  She was extubated later the evening of surgery. She remained afebrile and hemodynamically stable. Her [name], a line, chest tubes, and foley were removed early in her post operative course. She was started on Coreg which was titrated accordingly. She was weaned off of her insulin drip. Once she was tolerating a diet, she was restarted on Metformin and insulin. Her pre op HGA1C is 7.7.She was volume overloaded and diuresed accordingly. She had ABL anemia. Her last H and H was 8.1 and 24.6. She did not require a transfusion and she was started on Niferex. She was felt surgically stable for transfer from the ICU to [facility] on 06/28/2012. She continued to progress with cardiac rehab and is on room air. She has been tolerating a diet and has had a bowel movement. Her epicardial pacing wires and chest tube sutures will be removed prior to her discharge. Provided she remains afebrile, hemodynamically stable, and pending morning round evaluation, she will be surgically stable for discharge on 07/03/2012.   Latest Vital Signs: Blood pressure 102/87, pulse 75, temperature 97.7 F (36.5 C), temperature source Oral, resp. rate 19, height 5\' 3"  (1.6 m), weight 83.7 kg (184 lb 8.4 oz), SpO2 97.00%.  Physical Exam: Cardiovascular: RRR  Pulmonary: Diminished at bases; no rales, wheezes, or rhonchi.  Abdomen: Soft, non tender, bowel sounds present.  Extremities: Mild bilateral lower extremity edema.  Wounds: Clean and dry. No erythema or signs of  infection.    Discharge Condition:Stable  Recent laboratory studies:  Lab Results  Component Value Date   WBC 6.8 07/01/2012   HGB 8.1* 07/01/2012   HCT 24.6* 07/01/2012   MCV 91.8 07/01/2012   PLT 192 07/01/2012   Lab Results  Component Value Date   NA 138 07/01/2012   K 4.1 07/01/2012   CL 99 07/01/2012   CO2 34* 07/01/2012   CREATININE 1.12* 07/01/2012   GLUCOSE 86 07/01/2012      Diagnostic Studies: Dg Chest 2 View  07/01/2012  *RADIOLOGY REPORT*  Clinical Data: atelectasis and effusions.  Status post CABG  CHEST - 2 VIEW  Comparison: 06/29/2012  Findings: The patient is status post median sternotomy CABG procedure.  Cardiac enlargement is stable.  Persistent small bilateral pleural effusions.  Compared with the previous exam these appear improved in the interval.  Airspace opacities within the left upper lobe and left lower lobe have improved in the interval.  IMPRESSION:  1.  Decrease in the bilateral pleural effusions. 2.  Improved aeration to the left lung.   Original Report Authenticated By: [name], M.D.      Future Appointments [name] Department Dept Phone   07/25/2012 1:00 PM Jody Ovens, MD [facility] [phone]      Discharge Medications:   Medication List    STOP taking these medications       hydrochlorothiazide 12.5 MG capsule  Commonly known as:  MICROZIDE     potassium chloride 10 MEQ CR tablet  Commonly known as:  KLOR-CON      TAKE these medications       amiodarone 400 MG tablet  Commonly known as:  PACERONE  Amiodarone 400 mg po two times daily for 5 days;then take Amiodarone 200 mg po two times daily thereafter     aspirin 325 MG EC tablet  Take 1 tablet (325 mg total) by mouth daily.     benazepril 20 MG tablet  Commonly known as:  LOTENSIN  Take 1 tablet (20 mg total) by mouth daily.     carvedilol 25 MG tablet  Commonly known as:  COREG  Take 1 tablet (25 mg total) by mouth 2 (two) times  daily.     Co Q-10 100 MG Caps  Take 1 capsule by mouth 2 (two) times daily.     felodipine 10 MG 24 hr tablet  Commonly known as:  PLENDIL  Take 10 mg by mouth daily.     furosemide 40 MG tablet  Commonly known as:  LASIX  Take 1 tablet (40 mg total) by mouth daily. For 5 days then stop.     insulin glargine 100 UNIT/ML injection  Commonly known as:  LANTUS  Inject 21-22 Units into the skin daily. Normally takes 21 units qam but If sugar is higher than usual for a couple days she will take 22     insulin lispro 100 UNIT/ML injection  Commonly known as:  HUMALOG  Inject 6-8 Units into the skin 3 (three) times daily. Pt takes 6 units with each meal, can take up to 8 units in the evening it just depends on what she's eating     iron polysaccharides 150 MG capsule  Commonly known as:  NIFEREX  Take 1 capsule (150 mg total) by mouth daily. For one  month then stop.     levothyroxine 100 MCG tablet  Commonly known as:  SYNTHROID, LEVOTHROID  Take 100 mcg by mouth daily.     metFORMIN 750 MG 24 hr tablet  Commonly known as:  GLUCOPHAGE-XR  Take 750 mg by mouth 2 (two) times daily.     multivitamins ther. w/minerals Tabs  Take 1 tablet by mouth daily.     oxyCODONE 5 MG immediate release tablet  Commonly known as:  Oxy IR/ROXICODONE  Take 1 tablet (5 mg total) by mouth every 4 (four) hours as needed for pain.     potassium chloride SA 20 MEQ tablet  Commonly known as:  K-DUR,KLOR-CON  Take 1 tablet (20 mEq total) by mouth daily. For 5 days then stop.     rosuvastatin 20 MG tablet  Commonly known as:  CRESTOR  Take 20 mg by mouth every evening.     TUMS 500 MG chewable tablet  Generic drug:  calcium carbonate  Chew 1 tablet by mouth daily as needed for heartburn (indigestion).       The patient has been discharged on:   1.Beta Blocker:  Yes [  x ]                              No   [   ]                              If No, reason:  2.Ace Inhibitor/ARB: Yes [ x  ]                                      No  [    ]                                     If No, reason:  3.Statin:   Yes [  x ]                  No  [   ]                  If No, reason:  4.Ecasa:  Yes  [ x  ]                  No   [   ]                  If No, reason:  Follow Up Appointments:     Follow-up Information   Follow up with [name]., MD. (Call for a follow up for 2 weeks)    Contact information:   [address] [place] [address] [phone]       Follow up with GERHARDT,EDWARD B, MD. (PA/LAT CXR to be taken (at [facility] which is in the same building as Dr. Dennie Maizes office) on 07/25/2012 at 12:00pm; Appointment with Dr. Tyrone [name] is on 07/25/2012 at 1:00 pm)    Contact information:   [address] [employer] [address] [place] [address] [phone]       Signed: Doree Fudge MPA-C 07/02/2012, 10:23 AM

## 2012-07-02 NOTE — Clinical Social Work Note (Signed)
CSW following to assist with dc planning. Pt was seen in ICU by unit CSW and Pt's information was sent to area SNFs. CSW gave Pt SNF bed offers, helped her narrow it down to 2 and will f/u with Pt on Wednesday for decision.  Pt aware of possible dc on Wednesday if medically cleared by MD.   Frederico Hamman, LCSW [phone]

## 2012-07-02 NOTE — Progress Notes (Addendum)
                   [address]            [place] [address]          [phone]      7 Days Post-Op Procedure(s) (LRB): AORTIC VALVE REPLACEMENT (AVR) (N/A) Coronary artery bypass graft  times two using left internal mammary artery and right leg greater saphenous vein (N/A) INTRAOPERATIVE TRANSESOPHAGEAL ECHOCARDIOGRAM (N/A)  Subjective: Patient had a bowel movement.  She just finished eating breakfast.No complaints.  Objective: Vital signs in last 24 hours: Temp:  [97.7 F (36.5 C)-99.2 F (37.3 C)] 97.7 F (36.5 C) (03/04 0442) Pulse Rate:  [73-82] 77 (03/04 0442) Cardiac Rhythm:  [-] Normal sinus rhythm;Atrial fibrillation (03/03 1935) Resp:  [18-19] 19 (03/04 0442) BP: (96-147)/(55-62) 96/55 mmHg (03/04 0442) SpO2:  [97 %-100 %] 97 % (03/04 0442) Weight:  [83.7 kg (184 lb 8.4 oz)] 83.7 kg (184 lb 8.4 oz) (03/04 0446)  Pre op weight  83 kg Current Weight  07/02/12 83.7 kg (184 lb 8.4 oz)      Intake/Output from previous day: 03/03 0701 - 03/04 0700 In: 480 [P.O.:480] Out: 1050 [Urine:1050]   Physical Exam: BP 96/55  Pulse 77  Temp(Src) 97.7 F (36.5 C) (Oral)  Resp 19  Ht 5\' 3"  (1.6 m)  Wt 83.7 kg (184 lb 8.4 oz)  BMI 32.7 kg/m2  SpO2 97%  Cardiovascular: RRR Pulmonary: Diminished at bases; no rales, wheezes, or rhonchi. Abdomen: Soft, non tender, bowel sounds present. Extremities: Mild bilateral lower extremity edema. Wounds: Clean and dry.  No erythema or signs of infection.  Lab Results: CBC:  Recent Labs  06/30/12 0544 07/01/12 0555  WBC 9.3 6.8  HGB 8.0* 8.1*  HCT 23.6* 24.6*  PLT 171 192   BMET:   Recent Labs  06/30/12 0544 07/01/12 0555  NA 137 138  K 4.0 4.1  CL 99 99  CO2 33* 34*  GLUCOSE 183* 86  BUN 22 24*  CREATININE 0.92 1.12*  CALCIUM 8.7 9.0    PT/INR:  Lab Results  Component Value Date   INR 1.57* 06/25/2012   INR 0.94 06/21/2012   INR 1.1* 05/31/2012   ABG:  INR: Will add last result for  INR, ABG once components are confirmed Will add last 4 CBG results once components are confirmed  Assessment/Plan:  1. CV - Went into afib yesterday morning. Given Amiodarone bolus. SR since now.On Amiodarone 400 bid, Coreg 25 bid, and Lotensin 20 daily. Hold Lotensin this am as SBP in the 90's. 2.  Pulmonary - Encourage incentive spirometer and flutter valve 3. Volume Overload - On Lasix 40 two times daily with good diuresis yesterday. Is almost at pre op weight so will give daily lasix upon discharge 4.  Acute blood loss anemia - H and H stable at 8.1 and 24.6. Continue Nu Iron. 5.DM-CBGs 180/131/97. On Metformin 750 bid and Insulin. Pre op HGA1C 7.7. 7.Right lower extremity sutures need to remain for at least 12-14 days 8.Remove EPW today 9.Remove chest tube sutures tomorrow 10. To SNF 1-2 days  ZIMMERMAN,DONIELLE MPA-C 07/02/2012,7:28 AM  I have seen and examined [name] and agree with the above assessment  and plan.  [name] MD Beeper [phone] Office [phone] 07/02/2012 12:37 PM

## 2012-07-02 NOTE — Progress Notes (Signed)
EPW removed per protocol. Pt instructed to lie flat in bed for one hour. Alerted CMT to pulling of wires. Will continue to monitor pt closely.

## 2012-07-02 NOTE — Progress Notes (Signed)
Physical Therapy Treatment Patient Details Name: [name] MRN: [id number] DOB: January 06, 1933 Today's Date: 07/02/2012 Time: 1201-1219 PT Time Calculation (min): 18 min  PT Assessment / Plan / Recommendation Comments on Treatment Session  Pt s/p CABG.  Assisted pt to bathroom and back during lunch.  Returned after lunch to amb further but pt then lightheaded.  BP 90/52 in standing.  Pt reports she had felt really good until lunch.    Follow Up Recommendations  SNF     Does the patient have the potential to tolerate intense rehabilitation     Barriers to Discharge        Equipment Recommendations  Rolling walker with 5" wheels    Recommendations for Other Services    Frequency Min 2X/week   Plan Discharge plan needs to be updated;Frequency needs to be updated    Precautions / Restrictions Precautions Precautions: Fall;Sternal   Pertinent Vitals/Pain See flow sheet. BP 90/52 in standing.    Mobility  Bed Mobility Bed Mobility: Sit to Sidelying Left Sit to Sidelying Left: 4: Min assist Details for Bed Mobility Assistance: assist to bring feet up onto bed. Transfers Sit to Stand: 5: Supervision;Without upper extremity assist;From bed Stand to Sit: 5: Supervision;Without upper extremity assist;To bed Details for Transfer Assistance: Initially reminded to minimize use of hands.  On subsequent stands no cues needed. Ambulation/Gait Ambulation/Gait Assistance: 4: Min assist Ambulation Distance (Feet): 20 Feet Assistive device: None Gait Pattern: Step-through pattern;Decreased stride length Gait velocity: decreased    Exercises     PT Diagnosis:    PT Problem List:   PT Treatment Interventions:     PT Goals Acute Rehab PT Goals PT Goal: Sit to Stand - Progress: Progressing toward goal PT Goal: Ambulate - Progress: Progressing toward goal Additional Goals PT Goal: Additional Goal #1 - Progress: Progressing toward goal  Visit Information  Last PT Received On:  07/02/12 Assistance Needed: +1    Subjective Data  Subjective: Pt stated she felt good before lunch but now feels lightheaded.   Cognition  Cognition Overall Cognitive Status: Appears within functional limits for tasks assessed/performed Arousal/Alertness: Awake/alert Orientation Level: Appears intact for tasks assessed Behavior During Session: [employer] for tasks performed    Balance  Static Standing Balance Static Standing - Balance Support: During functional activity;Left upper extremity supported Static Standing - Level of Assistance: 5: Stand by assistance  End of Session PT - End of Session Activity Tolerance: Other (comment) (limited by lightheadedness) Patient left: in bed;with call bell/phone within reach Nurse Communication: Mobility status;Other (comment) (low BP)   GP     MAYCOCK,CARY 07/02/2012, 1:28 PM  [facility] PT [phone]

## 2012-07-03 LAB — GLUCOSE, CAPILLARY
Glucose-Capillary: 87 mg/dL (ref 70–99)
Glucose-Capillary: 188 mg/dL — ABNORMAL HIGH (ref 70–99)
Glucose-Capillary: 161 mg/dL — ABNORMAL HIGH (ref 70–99)

## 2012-07-03 NOTE — Progress Notes (Signed)
CARDIAC REHAB PHASE I   PRE:  Rate/Rhythm: 64SR  BP:  Supine: 94/50  Sitting:   Standing:    SaO2: 99%RA  MODE:  Ambulation: 400 ft   POST:  Rate/Rhythem: 77  BP:  Supine:   Sitting: 112/60  Standing:    SaO2: 97%RA 0940-1028 Pt walked 400 ft on RA with rolling walker and asst x 1. Slightly dizzy at start of walk but improved. BP improved with walk. To recliner after walk. Call bell in reach. Education completed. Permission given to refer to [facility] Phase 2. Gave pt diabetic and heart healthy diets.  Duanne Limerick

## 2012-07-03 NOTE — Progress Notes (Addendum)
   [address]       [place] [address]             [phone]    8 Days Post-Op  Procedure(s) (LRB): AORTIC VALVE REPLACEMENT (AVR) (N/A) Coronary artery bypass graft  times two using left internal mammary artery and right leg greater saphenous vein (N/A) INTRAOPERATIVE TRANSESOPHAGEAL ECHOCARDIOGRAM (N/A) Subjective: Feels ok  Objective  Telemetry sinus with occ pvc  Temp:  [97.5 F (36.4 C)-98.9 F (37.2 C)] 98.3 F (36.8 C) (03/05 0517) Pulse Rate:  [65-78] 73 (03/05 0753) Resp:  [18] 18 (03/05 0517) BP: (90-138)/(45-87) 107/76 mmHg (03/05 0753) SpO2:  [96 %-99 %] 96 % (03/05 0517) Weight:  [183 lb 10.3 oz (83.3 kg)] 183 lb 10.3 oz (83.3 kg) (03/05 0517)   Intake/Output Summary (Last 24 hours) at 07/03/12 0754 Last data filed at 07/02/12 1800  Gross per 24 hour  Intake    720 ml  Output      0 ml  Net    720 ml       General appearance: alert, cooperative and no distress Heart: regular rate and rhythm Lungs: clear to auscultation bilaterally Abdomen: benign Extremities: minor LE edema Wound: incisions healing well  Lab Results:  Recent Labs  07/01/12 0555  NA 138  K 4.1  CL 99  CO2 34*  GLUCOSE 86  BUN 24*  CREATININE 1.12*  CALCIUM 9.0   No results found for this basename: AST, ALT, ALKPHOS, BILITOT, PROT, ALBUMIN,  in the last 72 hours No results found for this basename: LIPASE, AMYLASE,  in the last 72 hours  Recent Labs  07/01/12 0555  WBC 6.8  HGB 8.1*  HCT 24.6*  MCV 91.8  PLT 192   No results found for this basename: CKTOTAL, CKMB, TROPONINI,  in the last 72 hours No components found with this basename: POCBNP,  No results found for this basename: DDIMER,  in the last 72 hours No results found for this basename: HGBA1C,  in the last 72 hours No results found for this basename: CHOL, HDL, LDLCALC, TRIG, CHOLHDL,  in the last 72 hours No results found for this basename: TSH, T4TOTAL, FREET3, T3FREE, THYROIDAB,  in  the last 72 hours No results found for this basename: VITAMINB12, FOLATE, FERRITIN, TIBC, IRON, RETICCTPCT,  in the last 72 hours  Medications: Scheduled    Radiology/Studies:  No results found.  INR: Will add last result for INR, ABG once components are confirmed Will add last 4 CBG results once components are confirmed  Assessment/Plan: S/P Procedure(s) (LRB): AORTIC VALVE REPLACEMENT (AVR) (N/A) Coronary artery bypass graft  times two using left internal mammary artery and right leg greater saphenous vein (N/A) INTRAOPERATIVE TRANSESOPHAGEAL ECHOCARDIOGRAM (N/A)   Appears medically stable for D/C when bed available   LOS: 8 days    [name],Jody E 3/5/20147:54 AM  To rehab today I have seen and examined [name] and agree with the above assessment  and plan.  [name] MD Beeper [phone] Office [phone] 07/03/2012 3:20 PM

## 2012-07-03 NOTE — Progress Notes (Signed)
Discharge instructions given as per MD order.  Patient to d/c to SNF via EMS.  Wound care complete and sutures removed.  steri strip applied.

## 2012-07-03 NOTE — Progress Notes (Signed)
Occupational Therapy Note  OT order received and appreciated.  Per social worker Delice Bison), pt preparing to d/c to SNF today (going to [place]).  Awaiting transport.  Will defer OT eval to next venue of care.  07/03/2012 [name] OTR/L Pager [phone] Office [phone]

## 2012-07-04 ENCOUNTER — Encounter: Payer: Medicare Other | Admitting: Cardiothoracic Surgery

## 2012-07-09 ENCOUNTER — Ambulatory Visit (INDEPENDENT_AMBULATORY_CARE_PROVIDER_SITE_OTHER): Payer: Self-pay

## 2012-07-09 NOTE — Progress Notes (Signed)
Removed 20 sutures from [facility] site of right thigh. No signs of infection and pt tolerated well. She is scheduled to see Dr Tyrone Sage on 07/25/12 with CXR prior. Pt aware

## 2012-07-15 NOTE — Clinical Social Work Placement (Signed)
Late entry 07/15/12 for 07/13/12  Clinical Social Work Department CLINICAL SOCIAL WORK PLACEMENT NOTE 07/15/2012  Patient:  [name], [name]  Account Number:  [id number] Admit date:  06/25/2012  Clinical Social Worker:  Frederico Hamman, LCSW  Date/time:  07/02/2012 12:00 M  Clinical Social Work is seeking post-discharge placement for this patient at the following level of care:   SKILLED NURSING   (*CSW will update this form in Epic as items are completed)   06/27/2012  Patient/family provided with [facility] Department of Clinical Social Work's list of facilities offering this level of care within the geographic area requested by the patient (or if unable, by the patient's family).  06/27/2012  Patient/family informed of their freedom to choose among providers that offer the needed level of care, that participate in Medicare, Medicaid or managed care program needed by the patient, have an available bed and are willing to accept the patient.  06/27/2012  Patient/family informed of [facility]' ownership interest in [facility], as well as of the fact that they are under no obligation to receive care at this facility.  PASARR submitted to EDS on 06/27/2012 PASARR number received from EDS on 06/27/2012  FL2 transmitted to all facilities in geographic area requested by pt/family on  06/27/2012 FL2 transmitted to all facilities within larger geographic area on   Patient informed that his/her managed care company has contracts with or will negotiate with  certain facilities, including the following:     Patient/family informed of bed offers received:  07/02/2012 Patient chooses bed at [facility] Physician recommends and patient chooses bed at    Patient to be transferred to [facility] on  07/03/2012 Patient to be transferred to facility by [facility]  The following physician request were entered in Epic:   Additional Comments: Pt's sister  meeting Pt at SNF. DC summary sent to [facility]. Pt pleased with placement opportunity. Non-emergency transport arranged.  Frederico Hamman, LCSW [phone]

## 2012-07-16 ENCOUNTER — Encounter: Payer: Self-pay | Admitting: *Deleted

## 2012-07-16 DIAGNOSIS — Z951 Presence of aortocoronary bypass graft: Secondary | ICD-10-CM | POA: Insufficient documentation

## 2012-07-22 ENCOUNTER — Encounter: Payer: Self-pay | Admitting: Nurse Practitioner

## 2012-07-22 ENCOUNTER — Ambulatory Visit (INDEPENDENT_AMBULATORY_CARE_PROVIDER_SITE_OTHER): Payer: Medicare Other | Admitting: Nurse Practitioner

## 2012-07-22 VITALS — BP 108/60 | HR 56 | Ht 64.0 in | Wt 187.1 lb

## 2012-07-22 DIAGNOSIS — I10 Essential (primary) hypertension: Secondary | ICD-10-CM

## 2012-07-22 DIAGNOSIS — Z954 Presence of other heart-valve replacement: Secondary | ICD-10-CM

## 2012-07-22 DIAGNOSIS — Z951 Presence of aortocoronary bypass graft: Secondary | ICD-10-CM

## 2012-07-22 DIAGNOSIS — Z79899 Other long term (current) drug therapy: Secondary | ICD-10-CM

## 2012-07-22 DIAGNOSIS — Z952 Presence of prosthetic heart valve: Secondary | ICD-10-CM

## 2012-07-22 LAB — BASIC METABOLIC PANEL
BUN: 15 mg/dL (ref 6–23)
CO2: 23 mEq/L (ref 19–32)
Calcium: 8.5 mg/dL (ref 8.4–10.5)
Chloride: 100 mEq/L (ref 96–112)
Creatinine, Ser: 1.2 mg/dL (ref 0.4–1.2)
GFR: 55.98 mL/min — ABNORMAL LOW (ref >60.00)
Glucose, Bld: 158 mg/dL — ABNORMAL HIGH (ref 70–99)
Potassium: 3.7 mEq/L (ref 3.5–5.1)
Sodium: 132 mEq/L — ABNORMAL LOW (ref 135–145)

## 2012-07-22 LAB — CBC WITH DIFFERENTIAL/PLATELET
Basophils Absolute: 0 10*3/uL (ref 0.0–0.1)
Basophils Relative: 0.5 % (ref 0.0–3.0)
Eosinophils Absolute: 0.1 10*3/uL (ref 0.0–0.7)
Eosinophils Relative: 0.8 % (ref 0.0–5.0)
HCT: 24.8 % — ABNORMAL LOW (ref 36.0–46.0)
Hemoglobin: 8.4 g/dL — ABNORMAL LOW (ref 12.0–15.0)
Lymphocytes Relative: 15.1 % (ref 12.0–46.0)
Lymphs Abs: 0.9 10*3/uL (ref 0.7–4.0)
MCHC: 33.8 g/dL (ref 30.0–36.0)
MCV: 92.2 fl (ref 78.0–100.0)
Monocytes Absolute: 0.6 10*3/uL (ref 0.1–1.0)
Monocytes Relative: 10.4 % (ref 3.0–12.0)
Neutro Abs: 4.5 10*3/uL (ref 1.4–7.7)
Neutrophils Relative %: 73.2 % (ref 43.0–77.0)
Platelets: 221 10*3/uL (ref 150.0–400.0)
RBC: 2.69 Mil/uL — ABNORMAL LOW (ref 3.87–5.11)
RDW: 15.7 % — ABNORMAL HIGH (ref 11.5–14.6)
WBC: 6.1 10*3/uL (ref 4.5–10.5)

## 2012-07-22 LAB — TSH: TSH: 6.47 u[IU]/mL — ABNORMAL HIGH (ref 0.35–5.50)

## 2012-07-22 MED ORDER — AMIODARONE HCL 200 MG PO TABS: 200.0000 mg | ORAL_TABLET | Freq: Every day | ORAL | Status: DC

## 2012-07-22 MED ORDER — FUROSEMIDE 40 MG PO TABS: ORAL_TABLET | ORAL | Status: DC

## 2012-07-22 NOTE — Patient Instructions (Signed)
We need to check labs today  I am cutting the amiodarone back to just 200 mg once a day  I am giving you Lasix (fluid pill) 40 mg to take once a day for five days only, then stop  I will see you in about 2 weeks  We will talk about cardiac rehab when you come back  Call the [facility] office at [phone] if you have any questions, problems or concerns.

## 2012-07-22 NOTE — Progress Notes (Addendum)
[name] Date of Birth: 16-Feb-1933 Medical Record #[id number]  History of Present Illness: [name] is seen back today for a post hospital visit. She is seen for Dr. Elease Hashimoto. She has had recent AVR and CABG x 2 per Dr. Tyrone Sage. Other issues include past history of AS, DM, HLD, HTN and hypothyroidism.   She most recently had CABG x 2 and AVR. She had LIMA to the LAD and SVG to the LCX with pericardial Edwards tissue valve #21 mm. Had post op atrial fib - on amiodarone. Went to rehab for 2 weeks and just returned home this past Friday.   She comes in today. She is here along. Doing ok. Very grateful to Dr. Tyrone Sage. She says she is doing ok. Her sister is helping her out.  Little short of breath. Little swelling. Some cough noted. No longer on her HCTZ. Weight is up a little. People are bringing her food which probably has too much salt. Not dizzy or lightheaded. Few "skips" noted. She says she is going to consider rehab. No fever or chills. Sternum is ok. Not wearing her bra.   Current Outpatient Prescriptions on File Prior to Visit  Medication Sig Dispense Refill  . aspirin EC 325 MG EC tablet Take 1 tablet (325 mg total) by mouth daily.  30 tablet    . benazepril (LOTENSIN) 20 MG tablet Take 1 tablet (20 mg total) by mouth daily.  30 tablet    . calcium carbonate (TUMS) 500 MG chewable tablet Chew 1 tablet by mouth daily as needed for heartburn (indigestion).      . carvedilol (COREG) 25 MG tablet Take 1 tablet (25 mg total) by mouth 2 (two) times daily.      . Coenzyme Q10 (CO Q-10) 100 MG CAPS Take 1 capsule by mouth 2 (two) times daily.      . felodipine (PLENDIL) 10 MG 24 hr tablet Take 10 mg by mouth daily.       . insulin glargine (LANTUS) 100 UNIT/ML injection Inject 21-22 Units into the skin daily. Normally takes 21 units qam but If sugar is higher than usual for a couple days she will take 22      . insulin lispro (HUMALOG) 100 UNIT/ML injection Inject 6-8 Units into the skin  3 (three) times daily. Pt takes 6 units with each meal, can take up to 8 units in the evening it just depends on what she's eating      . iron polysaccharides (NIFEREX) 150 MG capsule Take 1 capsule (150 mg total) by mouth daily. For one month then stop.      Marland Kitchen levothyroxine (SYNTHROID, LEVOTHROID) 100 MCG tablet Take 100 mcg by mouth daily.      . metFORMIN (GLUCOPHAGE-XR) 750 MG 24 hr tablet Take 750 mg by mouth 2 (two) times daily.      . Multiple Vitamins-Minerals (MULTIVITAMINS THER. W/MINERALS) TABS Take 1 tablet by mouth daily.      . rosuvastatin (CRESTOR) 20 MG tablet Take 20 mg by mouth every evening.        No current facility-administered medications on file prior to visit.    No Known Allergies  Past Medical History  Diagnosis Date  . Thyroid disease   . Diabetes mellitus   . Lumbago   . Aortic valve disorders     S/P AVR 06/2012  . Hyperlipidemia   . Heart murmur   . Hypertension   . Hypothyroidism   . Depression   .  Shortness of breath   . Asthma     AS CHILD   . Arthritis     Past Surgical History  Procedure Laterality Date  . Tubal ligation    . Breast surgery      LT BREAST BX BENIGN  . Aortic valve replacement N/A 06/25/2012    Procedure: AORTIC VALVE REPLACEMENT (AVR);  Surgeon: [name], MD;  Location: [facility] OR;  Service: Open Heart Surgery;  Laterality: N/A;  . Coronary artery bypass graft N/A 06/25/2012    Procedure: Coronary artery bypass graft  times two using left internal mammary artery and right leg greater saphenous vein;  Surgeon: [name], MD;  Location: [facility] OR;  Service: Open Heart Surgery;  Laterality: N/A;  . Intraoperative transesophageal echocardiogram N/A 06/25/2012    Procedure: INTRAOPERATIVE TRANSESOPHAGEAL ECHOCARDIOGRAM;  Surgeon: [name], MD;  Location: [facility] OR;  Service: Open Heart Surgery;  Laterality: N/A;    History  Smoking status  . Never Smoker   Smokeless tobacco  . Not on file    History  Alcohol  Use No    Family History  Problem Relation Age of Onset  . Heart disease Father     Review of Systems: The review of systems is per the HPI. All other systems were reviewed and are negative.  Physical Exam: BP 108/60  Pulse 56  Ht 5\' 4"  (1.626 m)  Wt 187 lb 1.9 oz (84.877 kg)  BMI 32.1 kg/m2 Patient is very pleasant and in no acute distress. She is obese. Skin is warm and dry. Color is normal.  HEENT is unremarkable. Normocephalic/atraumatic. PERRL. Sclera are nonicteric. Neck is supple. No masses. No JVD. Lungs are clear with decreased breath sounds. Cardiac exam shows a regular rate and rhythm. Abdomen is soft. Extremities are with 1+ bilateral edema. Gait and ROM are intact. She is using a cane. No gross neurologic deficits noted.  LABORATORY DATA: PENDING  EKG today shows sinus bradycardia. Her QT is prolonged at 560.   Lab Results  Component Value Date   WBC 6.8 07/01/2012   HGB 8.1* 07/01/2012   HCT 24.6* 07/01/2012   PLT 192 07/01/2012   GLUCOSE 86 07/01/2012   ALT 12 06/21/2012   AST 23 06/21/2012   NA 138 07/01/2012   K 4.1 07/01/2012   CL 99 07/01/2012   CREATININE 1.12* 07/01/2012   BUN 24* 07/01/2012   CO2 34* 07/01/2012   INR 1.57* 06/25/2012   HGBA1C 7.7* 06/21/2012    Assessment / Plan: 1. S/P CABG x 2 with pericardial tissue AVR - doing ok.   2. Post op atrial fib - in sinus - I am stopping her amiodarone. QT is prolonged. EKG was reviewed with Dr. Elease Hashimoto today and he is in agreement. Will recheck on return.   3. Post op volume overload - probably getting too much salt. I will give her a short course of Lasix 40 mg a day for 5 days. She needs to restrict her salt. Check baseline labs today.   4. HTN - blood pressure looks good.   5. Post op anemia - recheck today.   Patient is agreeable to this plan and will call if any problems develop in the interim.   Rosalio Macadamia, RN, ANP-C [facility] [address] [place], [place]   [address]   Addendum: Labs noted from today. TSH is up slightly. Her amiodarone was stopped at her OV. Would recheck TSH in one month.  Lab Results  Component Value Date   WBC 6.1 07/22/2012   HGB 8.4 Repeated and verified X2.* 07/22/2012   HCT 24.8 Repeated and verified X2.* 07/22/2012   PLT 221.0 07/22/2012   GLUCOSE 158* 07/22/2012   ALT 12 06/21/2012   AST 23 06/21/2012   NA 132* 07/22/2012   K 3.7 07/22/2012   CL 100 07/22/2012   CREATININE 1.2 07/22/2012   BUN 15 07/22/2012   CO2 23 07/22/2012   TSH 6.47* 07/22/2012   INR 1.57* 06/25/2012   HGBA1C 7.7* 06/21/2012

## 2012-07-24 ENCOUNTER — Telehealth: Payer: Self-pay | Admitting: Cardiovascular Disease

## 2012-07-24 NOTE — Telephone Encounter (Signed)
Pt informed msg left.

## 2012-07-24 NOTE — Telephone Encounter (Signed)
Pt is taking  lasix 40 mg and today is day 3 of a 5 day regiment.  With her bp being 88/50 this morning, her original sx were edema and sob. Pt states she is feeling much better but has just a little SOB. Advised pt to take 20 mg in am and if she tolerates this she may take other half of tablet around 1-2 pm to get her through her next two days. She will use her judgement, she had very good understanding of her medications. Told her to call with further question or concerns, pt agreed to plan.

## 2012-07-24 NOTE — Telephone Encounter (Signed)
Let's just cut her back to 20 mg of Lasix

## 2012-07-24 NOTE — Telephone Encounter (Signed)
New problem    C/O 88/50 . lightheaded & weak .    On lasix  40 mg for 5 days .

## 2012-07-25 ENCOUNTER — Encounter: Payer: Self-pay | Admitting: Cardiothoracic Surgery

## 2012-07-25 ENCOUNTER — Ambulatory Visit (INDEPENDENT_AMBULATORY_CARE_PROVIDER_SITE_OTHER): Payer: Medicare Other | Admitting: Cardiothoracic Surgery

## 2012-07-25 ENCOUNTER — Ambulatory Visit
Admission: RE | Admit: 2012-07-25 | Discharge: 2012-07-25 | Disposition: A | Discharge status: Home / Self Care | Payer: Medicare Other | Source: Ambulatory Visit | Attending: Cardiothoracic Surgery | Admitting: Cardiothoracic Surgery

## 2012-07-25 VITALS — BP 123/65 | HR 65 | Resp 20 | Ht 64.0 in | Wt 187.0 lb

## 2012-07-25 DIAGNOSIS — I359 Nonrheumatic aortic valve disorder, unspecified: Secondary | ICD-10-CM

## 2012-07-25 DIAGNOSIS — Z954 Presence of other heart-valve replacement: Secondary | ICD-10-CM

## 2012-07-25 DIAGNOSIS — Z951 Presence of aortocoronary bypass graft: Secondary | ICD-10-CM

## 2012-07-25 DIAGNOSIS — I350 Nonrheumatic aortic (valve) stenosis: Secondary | ICD-10-CM

## 2012-07-25 DIAGNOSIS — Z952 Presence of prosthetic heart valve: Secondary | ICD-10-CM

## 2012-07-25 DIAGNOSIS — I2510 Atherosclerotic heart disease of native coronary artery without angina pectoris: Secondary | ICD-10-CM

## 2012-07-25 NOTE — Patient Instructions (Addendum)
Continue Lasix  40 mg day until next cardiology appointment If home nurse tells you your BP is low decrease Benazepril to 10mg  day and call cardiology  Endocarditis Information  You may be at risk for developing endocarditis since you have  an artificial heart valve  or a repaired heart valve. Endocarditis is an infection of the lining of the heart or heart valves.   Certain surgical and dental procedures may put you at risk,  such as teeth cleaning or other dental procedures or any surgery involving the respiratory, urinary, gastrointestinal tract, gallbladder or prostate.   Notify your doctor or dentist before having any invasive procedures. You will need to take antibiotics before certain procedures.   To prevent endocarditis, maintain good oral health. Seek prompt medical attention for any mouth/gum, skin or urinary tract infections.

## 2012-07-25 NOTE — Progress Notes (Signed)
[address]            [place] [address]          [phone]       Maylon Peppers [address] Medical Record #[id number] Date of Birth: 19-Feb-1933  Nahser, Deloris Ping, MD Willey Blade, MD  Chief Complaint:   PostOp Follow Up Visit 06/25/2012 SURGICAL PROCEDURE: Coronary artery bypass grafting x2 with the left  internal mammary to the left anterior descending coronary artery,  reverse saphenous vein graft to the circumflex coronary artery, and  aortic valve replacement with a pericardial [facility] [employer] tissue  valve, model #3300 TFX, serial [id number], 21 mm, and right thigh Endovein  harvesting.   History of Present Illness:     Patient returns today following coronary artery bypass grafting and aortic valve replacement with a pericardial tissue. She is making reasonable progress postoperatively but has had some increased pedal edema and is on course of Lasix. The home nurse noted her blood pressure being slightly low yesterday and her Lasix dose was divided. She is increasing her physical activity appropriately. She notes that she's had mild pedal edema.     History  Smoking status  . Never Smoker   Smokeless tobacco  . Not on file       No Known Allergies  Current Outpatient Prescriptions  Medication Sig Dispense Refill  . aspirin EC 325 MG EC tablet Take 1 tablet (325 mg total) by mouth daily.  30 tablet    . benazepril (LOTENSIN) 20 MG tablet Take 1 tablet (20 mg total) by mouth daily.  30 tablet    . calcium carbonate (TUMS) 500 MG chewable tablet Chew 1 tablet by mouth daily as needed for heartburn (indigestion).      . carvedilol (COREG) 25 MG tablet Take 1 tablet (25 mg total) by mouth 2 (two) times daily.      . Coenzyme Q10 (CO Q-10) 100 MG CAPS Take 1 capsule by mouth 2 (two) times daily.      . felodipine (PLENDIL) 10 MG 24 hr tablet Take 10 mg by mouth daily.       . furosemide (LASIX) 40 MG tablet Take 20 mg by mouth  daily.      . insulin glargine (LANTUS) 100 UNIT/ML injection Inject 21-22 Units into the skin daily. Normally takes 21 units qam but If sugar is higher than usual for a couple days she will take 22      . insulin lispro (HUMALOG) 100 UNIT/ML injection Inject 6-8 Units into the skin 3 (three) times daily. Pt takes 6 units with each meal, can take up to 8 units in the evening it just depends on what she's eating      . iron polysaccharides (NIFEREX) 150 MG capsule Take 1 capsule (150 mg total) by mouth daily. For one month then stop.      Marland Kitchen levothyroxine (SYNTHROID, LEVOTHROID) 100 MCG tablet Take 100 mcg by mouth daily.      . metFORMIN (GLUCOPHAGE-XR) 750 MG 24 hr tablet Take 750 mg by mouth 2 (two) times daily.      . Multiple Vitamins-Minerals (MULTIVITAMINS THER. W/MINERALS) TABS Take 1 tablet by mouth daily.      . rosuvastatin (CRESTOR) 20 MG tablet Take 20 mg by mouth every evening.        No current facility-administered medications for this  visit.       Physical Exam: BP 123/65  Pulse 65  Resp 20  Ht 5\' 4"  (1.626 m)  Wt 187 lb (84.823 kg)  BMI 32.08 kg/m2  SpO2 96%  General appearance: alert and cooperative Neurologic: intact Heart: regular rate and rhythm, S1, S2 normal, no murmur, click, rub or gallop Lungs: clear to auscultation bilaterally and normal percussion bilaterally Abdomen: soft, non-tender; bowel sounds normal; no masses,  no organomegaly Extremities: edema Bilateral lower extremity edema 1-2+ Wound: Sternal incision is stable and healing well  Diagnostic Studies & Laboratory data:         Recent Radiology Findings: Dg Chest 2 View  07/25/2012  *RADIOLOGY REPORT*  Clinical Data: CAD, post CABG.  CHEST - 2 VIEW  Comparison: 07/01/2012  Findings: Prior CABG and valve replacement.  Mild cardiomegaly. Left lower lobe atelectasis with small to moderate left pleural effusion.  These findings have increased slightly since prior study.  No significant right effusion.   No edema.  No acute bony abnormality.  IMPRESSION: Slight increase size of the left effusion and left lower lobe atelectasis.   Original Report Authenticated By: Charlett Nose, M.D.       Recent Labs: Lab Results  Component Value Date   WBC 6.1 07/22/2012   HGB 8.4 Repeated and verified X2.* 07/22/2012   HCT 24.8 Repeated and verified X2.* 07/22/2012   PLT 221.0 07/22/2012   GLUCOSE 158* 07/22/2012   ALT 12 06/21/2012   AST 23 06/21/2012   NA 132* 07/22/2012   K 3.7 07/22/2012   CL 100 07/22/2012   CREATININE 1.2 07/22/2012   BUN 15 07/22/2012   CO2 23 07/22/2012   TSH 6.47* 07/22/2012   INR 1.57* 06/25/2012   HGBA1C 7.7* 06/21/2012      Assessment / Plan:     Patient is progressing following aortic valve replacement and coronary artery bypass grafting, her QTC was elevated and her TSH is elevated on her last cardiology appointment her amiodarone was discontinued. She has some fluid retention and is now on Lasix 40 mg a day which she will continue until next week when she has a followup cardiology appointment. She has persistent anemia following surgery and is on iron and folate supplements I plan to see her back in 3 weeks with a followup chest x-ray.    Crystelle Ferrufino B 07/25/2012 1:50 PM

## 2012-07-31 ENCOUNTER — Encounter: Payer: Self-pay | Admitting: Nurse Practitioner

## 2012-07-31 ENCOUNTER — Other Ambulatory Visit: Payer: Self-pay | Admitting: *Deleted

## 2012-07-31 ENCOUNTER — Ambulatory Visit (INDEPENDENT_AMBULATORY_CARE_PROVIDER_SITE_OTHER): Payer: Medicare Other | Admitting: Nurse Practitioner

## 2012-07-31 VITALS — BP 130/68 | HR 68 | Ht 64.0 in | Wt 180.2 lb

## 2012-07-31 DIAGNOSIS — Z79899 Other long term (current) drug therapy: Secondary | ICD-10-CM

## 2012-07-31 DIAGNOSIS — Z951 Presence of aortocoronary bypass graft: Secondary | ICD-10-CM

## 2012-07-31 DIAGNOSIS — R7989 Other specified abnormal findings of blood chemistry: Secondary | ICD-10-CM

## 2012-07-31 DIAGNOSIS — Z952 Presence of prosthetic heart valve: Secondary | ICD-10-CM

## 2012-07-31 DIAGNOSIS — Z954 Presence of other heart-valve replacement: Secondary | ICD-10-CM

## 2012-07-31 LAB — CBC WITH DIFFERENTIAL/PLATELET
Basophils Absolute: 0 K/uL (ref 0.0–0.1)
Basophils Relative: 0.5 % (ref 0.0–3.0)
Eosinophils Absolute: 0.2 K/uL (ref 0.0–0.7)
Eosinophils Relative: 3 % (ref 0.0–5.0)
HCT: 27.5 % — ABNORMAL LOW (ref 36.0–46.0)
Hemoglobin: 9 g/dL — ABNORMAL LOW (ref 12.0–15.0)
Lymphocytes Relative: 20.9 % (ref 12.0–46.0)
Lymphs Abs: 1.4 K/uL (ref 0.7–4.0)
MCHC: 32.8 g/dL (ref 30.0–36.0)
MCV: 89.9 fl (ref 78.0–100.0)
Monocytes Absolute: 0.6 K/uL (ref 0.1–1.0)
Monocytes Relative: 9.5 % (ref 3.0–12.0)
Neutro Abs: 4.4 K/uL (ref 1.4–7.7)
Neutrophils Relative %: 66.1 % (ref 43.0–77.0)
Platelets: 328 K/uL (ref 150.0–400.0)
RBC: 3.06 Mil/uL — ABNORMAL LOW (ref 3.87–5.11)
RDW: 16.3 % — ABNORMAL HIGH (ref 11.5–14.6)
WBC: 6.6 K/uL (ref 4.5–10.5)

## 2012-07-31 LAB — TSH: TSH: 6.85 u[IU]/mL — ABNORMAL HIGH (ref 0.35–5.50)

## 2012-07-31 LAB — BASIC METABOLIC PANEL
BUN: 12 mg/dL (ref 6–23)
CO2: 28 mEq/L (ref 19–32)
Calcium: 9.1 mg/dL (ref 8.4–10.5)
Chloride: 97 mEq/L (ref 96–112)
Creatinine, Ser: 1 mg/dL (ref 0.4–1.2)
GFR: 69.88 mL/min (ref >60.00)
Glucose, Bld: 112 mg/dL — ABNORMAL HIGH (ref 70–99)
Potassium: 3.2 mEq/L — ABNORMAL LOW (ref 3.5–5.1)
Sodium: 136 mEq/L (ref 135–145)

## 2012-07-31 MED ORDER — POTASSIUM CHLORIDE ER 10 MEQ PO TBCR: 10.0000 meq | EXTENDED_RELEASE_TABLET | Freq: Every day | ORAL | Status: DC

## 2012-07-31 MED ORDER — FELODIPINE ER 10 MG PO TB24: 10.0000 mg | ORAL_TABLET | Freq: Every day | ORAL | Status: DC

## 2012-07-31 NOTE — Progress Notes (Addendum)
[name] Date of Birth: 11/27/1932 Medical Record #[id number]  History of Present Illness: Ms. Kintz is seen back today for a 2 week check. She is seen for Dr. Elease Hashimoto. She has had recent AVR and CABG x 2 per Dr. Tyrone Sage. This was with a LIMA to the LAD and SVG to the LCX with a pericardial Edwards tissue valve #21 mm. Did have post op atrial fib - on amiodarone. Other issues include past AS, DM, HLD, HTN and hypothryoidism.   I saw her 2 weeks ago. I gave her a short course of Lasix for some volume overload. We also stopped her amiodarone due to prolonged QT.   She comes back today. She is here alone. Weight is down 7 pounds. She is doing well. Feeling better and stronger. Still with some pedal edema - says she did not have this prior to her surgery. Still taking 40 mg of Lasix.  Walking in her house. Little scared about venturing outside. Ready to go to cardiac rehab. Not dizzy or lightheaded. No shortness of breath.   Current Outpatient Prescriptions on File Prior to Visit  Medication Sig Dispense Refill  . aspirin EC 325 MG EC tablet Take 1 tablet (325 mg total) by mouth daily.  30 tablet    . benazepril (LOTENSIN) 20 MG tablet Take 1 tablet (20 mg total) by mouth daily.  30 tablet    . calcium carbonate (TUMS) 500 MG chewable tablet Chew 1 tablet by mouth daily as needed for heartburn (indigestion).      . carvedilol (COREG) 25 MG tablet Take 1 tablet (25 mg total) by mouth 2 (two) times daily.      . Coenzyme Q10 (CO Q-10) 100 MG CAPS Take 1 capsule by mouth 2 (two) times daily.      . furosemide (LASIX) 40 MG tablet Take 20 mg by mouth daily.      . insulin glargine (LANTUS) 100 UNIT/ML injection Inject 21-22 Units into the skin daily. Normally takes 21 units qam but If sugar is higher than usual for a couple days she will take 22      . insulin lispro (HUMALOG) 100 UNIT/ML injection Inject 6-8 Units into the skin 3 (three) times daily. Pt takes 6 units with each meal, can take up  to 8 units in the evening it just depends on what she's eating      . iron polysaccharides (NIFEREX) 150 MG capsule Take 1 capsule (150 mg total) by mouth daily. For one month then stop.      Marland Kitchen levothyroxine (SYNTHROID, LEVOTHROID) 100 MCG tablet Take 100 mcg by mouth daily.      . metFORMIN (GLUCOPHAGE-XR) 750 MG 24 hr tablet Take 750 mg by mouth 2 (two) times daily.      . Multiple Vitamins-Minerals (MULTIVITAMINS THER. W/MINERALS) TABS Take 1 tablet by mouth daily.      . rosuvastatin (CRESTOR) 20 MG tablet Take 20 mg by mouth every evening.        No current facility-administered medications on file prior to visit.    No Known Allergies  Past Medical History  Diagnosis Date  . Thyroid disease   . Diabetes mellitus   . Lumbago   . Aortic valve disorders     S/P AVR 06/2012  . Hyperlipidemia   . Heart murmur   . Hypertension   . Hypothyroidism   . Depression   . Shortness of breath   . Asthma     AS  CHILD   . Arthritis     Past Surgical History  Procedure Laterality Date  . Tubal ligation    . Breast surgery      LT BREAST BX BENIGN  . Aortic valve replacement N/A 06/25/2012    Procedure: AORTIC VALVE REPLACEMENT (AVR);  Surgeon: [name], MD;  Location: [facility] OR;  Service: Open Heart Surgery;  Laterality: N/A;  . Coronary artery bypass graft N/A 06/25/2012    Procedure: Coronary artery bypass graft  times two using left internal mammary artery and right leg greater saphenous vein;  Surgeon: [name], MD;  Location: [facility] OR;  Service: Open Heart Surgery;  Laterality: N/A;  . Intraoperative transesophageal echocardiogram N/A 06/25/2012    Procedure: INTRAOPERATIVE TRANSESOPHAGEAL ECHOCARDIOGRAM;  Surgeon: [name], MD;  Location: [facility] OR;  Service: Open Heart Surgery;  Laterality: N/A;    History  Smoking status  . Never Smoker   Smokeless tobacco  . Not on file    History  Alcohol Use No    Family History  Problem Relation Age of Onset  .  Heart disease Father     Review of Systems: The review of systems is per the HPI.  All other systems were reviewed and are negative.  Physical Exam: BP 130/68  Pulse 68  Ht 5\' 4"  (1.626 m)  Wt 180 lb 3.2 oz (81.738 kg)  BMI 30.92 kg/m2 Patient is very pleasant and in no acute distress. Skin is warm and dry. Color is normal.  HEENT is unremarkable. Normocephalic/atraumatic. PERRL. Sclera are nonicteric. Neck is supple. No masses. No JVD. Lungs are clear. Cardiac exam shows a regular rate and rhythm. Abdomen is soft. Extremities are without edema. Gait and ROM are intact. No gross neurologic deficits noted.  LABORATORY DATA: Pending   Lab Results  Component Value Date   WBC 6.1 07/22/2012   HGB 8.4 Repeated and verified X2.* 07/22/2012   HCT 24.8 Repeated and verified X2.* 07/22/2012   PLT 221.0 07/22/2012   GLUCOSE 158* 07/22/2012   ALT 12 06/21/2012   AST 23 06/21/2012   NA 132* 07/22/2012   K 3.7 07/22/2012   CL 100 07/22/2012   CREATININE 1.2 07/22/2012   BUN 15 07/22/2012   CO2 23 07/22/2012   TSH 6.47* 07/22/2012   INR 1.57* 06/25/2012   HGBA1C 7.7* 06/21/2012   Assessment / Plan: 1. Post op AVR/CABG - doing well and making good progress. I have cut her Lasix back to just 20 mg a day. Rechecking her labs today. Recheck EKG today. See Dr. Elease Hashimoto in a month with echo. Will refer to cardiac rehab.   2. Post op anemia - recheck today  3. Elevated TSH - no longer on amiodarone - recheck today  4. HTN - blood pressure looks good.   Patient is agreeable to this plan and will call if any problems develop in the interim.   Rosalio Macadamia, RN, ANP-C [facility] [address] [place], [place]  [address]   Addendum:  EKG shows sinus rhythm. QT is normal at 392. Septal Q's noted (unchanged). Rate is 70.

## 2012-07-31 NOTE — Patient Instructions (Addendum)
We need to recheck your labs today  Cut your Lasix back to just a half a tablet (20mg )  I have refilled your Plendil today  Stay on your other medicines  I am referring you to cardiac rehab  See Dr. Elease Hashimoto in a month   Check ultrasound of your heart prior to your visit with Dr. Elease Hashimoto.

## 2012-08-12 DIAGNOSIS — Z48812 Encounter for surgical aftercare following surgery on the circulatory system: Secondary | ICD-10-CM

## 2012-08-14 ENCOUNTER — Other Ambulatory Visit: Payer: Self-pay | Admitting: *Deleted

## 2012-08-14 DIAGNOSIS — I350 Nonrheumatic aortic (valve) stenosis: Secondary | ICD-10-CM

## 2012-08-15 ENCOUNTER — Encounter: Payer: Self-pay | Admitting: Cardiothoracic Surgery

## 2012-08-15 ENCOUNTER — Ambulatory Visit (INDEPENDENT_AMBULATORY_CARE_PROVIDER_SITE_OTHER): Payer: Self-pay | Admitting: Cardiothoracic Surgery

## 2012-08-15 ENCOUNTER — Ambulatory Visit
Admission: RE | Admit: 2012-08-15 | Discharge: 2012-08-15 | Disposition: A | Discharge status: Home / Self Care | Payer: Medicare Other | Source: Ambulatory Visit | Attending: Cardiothoracic Surgery | Admitting: Cardiothoracic Surgery

## 2012-08-15 VITALS — BP 144/70 | HR 63 | Resp 20 | Ht 63.0 in | Wt 180.0 lb

## 2012-08-15 DIAGNOSIS — Z951 Presence of aortocoronary bypass graft: Secondary | ICD-10-CM

## 2012-08-15 DIAGNOSIS — Z954 Presence of other heart-valve replacement: Secondary | ICD-10-CM

## 2012-08-15 DIAGNOSIS — I350 Nonrheumatic aortic (valve) stenosis: Secondary | ICD-10-CM

## 2012-08-15 DIAGNOSIS — Z952 Presence of prosthetic heart valve: Secondary | ICD-10-CM

## 2012-08-15 NOTE — Patient Instructions (Addendum)
Start Cardiac Rehab May start driving  Endocarditis Information  You may be at risk for developing endocarditis since you have  an artificial heart valve  or a repaired heart valve. Endocarditis is an infection of the lining of the heart or heart valves.   Certain surgical and dental procedures may put you at risk,  such as teeth cleaning or other dental procedures or any surgery involving the respiratory, urinary, gastrointestinal tract, gallbladder or prostate.   Notify your doctor or dentist before having any invasive procedures. You will need to take antibiotics before certain procedures.   To prevent endocarditis, maintain good oral health. Seek prompt medical attention for any mouth/gum, skin or urinary tract infections.

## 2012-08-15 NOTE — Progress Notes (Signed)
[address]       [place] [address]             [phone]         Maylon Peppers [place] Medical Record #[id number] Date of Birth: [birth year]  Nahser, Deloris Ping, MD Willey Blade, MD  Chief Complaint:   PostOp Follow Up Visit 06/25/2012 SURGICAL PROCEDURE: Coronary artery bypass grafting x2 with the left  internal mammary to the left anterior descending coronary artery,  reverse saphenous vein graft to the circumflex coronary artery, and  aortic valve replacement with a pericardial [facility] tissue  valve, model #3300 TFX, serial [id number], 21 mm, and right thigh Endovein  harvesting.   History of Present Illness:     Patient returns today following coronary artery bypass grafting and aortic valve replacement with a pericardial tissue.   She is increasing her physical activity appropriately. She notes that she's had mild pedal edema which is improving. To start cardiac rehab soon.     History  Smoking status  . Never Smoker   Smokeless tobacco  . Not on file       No Known Allergies  Current Outpatient Prescriptions  Medication Sig Dispense Refill  . aspirin EC 325 MG EC tablet Take 1 tablet (325 mg total) by mouth daily.  30 tablet    . benazepril (LOTENSIN) 20 MG tablet Take 1 tablet (20 mg total) by mouth daily.  30 tablet    . calcium carbonate (TUMS) 500 MG chewable tablet Chew 1 tablet by mouth daily as needed for heartburn (indigestion).      . carvedilol (COREG) 25 MG tablet Take 1 tablet (25 mg total) by mouth 2 (two) times daily.      . Coenzyme Q10 (CO Q-10) 100 MG CAPS Take 1 capsule by mouth 2 (two) times daily.      . felodipine (PLENDIL) 10 MG 24 hr tablet Take 1 tablet (10 mg total) by mouth daily.  90 tablet  3  . furosemide (LASIX) 40 MG tablet Take 20 mg by mouth daily.      . insulin glargine (LANTUS) 100 UNIT/ML injection Inject 21-22 Units into the skin daily. Normally takes 21 units qam but If  sugar is higher than usual for a couple days she will take 22      . insulin lispro (HUMALOG) 100 UNIT/ML injection Inject 6-8 Units into the skin 3 (three) times daily. Pt takes 6 units with each meal, can take up to 8 units in the evening it just depends on what she's eating      . iron polysaccharides (NIFEREX) 150 MG capsule Take 1 capsule (150 mg total) by mouth daily. For one month then stop.      Marland Kitchen levothyroxine (SYNTHROID, LEVOTHROID) 100 MCG tablet Take 113 mcg by mouth daily.       . metFORMIN (GLUCOPHAGE-XR) 750 MG 24 hr tablet Take 750 mg by mouth 2 (two) times daily.      . Multiple Vitamins-Minerals (MULTIVITAMINS THER. W/MINERALS) TABS Take 1 tablet by mouth daily.      . potassium chloride (K-DUR) 10 MEQ tablet Take 1 tablet (10 mEq total) by mouth daily.  30 tablet  3  . rosuvastatin (CRESTOR) 20 MG tablet Take 20 mg by mouth every evening.        No current facility-administered medications for this visit.  Physical Exam: BP 144/70  Pulse 63  Resp 20  Ht 5\' 3"  (1.6 m)  Wt 180 lb (81.647 kg)  BMI 31.89 kg/m2  SpO2 98%  General appearance: alert and cooperative Neurologic: intact Heart: regular rate and rhythm, S1, S2 normal, no murmur, click, rub or gallop Lungs: clear to auscultation bilaterally and normal percussion bilaterally Abdomen: soft, non-tender; bowel sounds normal; no masses,  no organomegaly Extremities: edema Bilateral lower extremity edema 1-2+ Wound: Sternal incision is stable and healing well  Diagnostic Studies & Laboratory data:         Recent Radiology Findings: Dg Chest 2 View  08/15/2012  *RADIOLOGY REPORT*  Clinical Data: Follow-up aortic stenosis.  Post aortic valve replacement.  CHEST - 2 VIEW  Comparison: 07/25/2012  Findings: Changes of CABG and valve replacement.  Cardiomegaly. Interval resolution of left pleural effusion.  Improving left basilar opacity with minimal residual atelectasis.  Mild peribronchial thickening.  Right lung  is clear.  No acute bony abnormality.  IMPRESSION: Minimal residual left base atelectasis.  Resolution of the left pleural effusion.   Original Report Authenticated By: Charlett Nose, M.D.       Recent Labs: Lab Results  Component Value Date   WBC 6.6 07/31/2012   HGB 9.0* 07/31/2012   HCT 27.5* 07/31/2012   PLT 328.0 07/31/2012   GLUCOSE 112* 07/31/2012   ALT 12 06/21/2012   AST 23 06/21/2012   NA 136 07/31/2012   K 3.2* 07/31/2012   CL 97 07/31/2012   CREATININE 1.0 07/31/2012   BUN 12 07/31/2012   CO2 28 07/31/2012   TSH 6.85* 07/31/2012   INR 1.57* 06/25/2012   HGBA1C 7.7* 06/21/2012      Assessment / Plan:     Patient is progressing following aortic valve replacement and coronary artery bypass grafting She has some fluid retention which has improved and she  is now on Lasix 20 mg a day which she will continue until next week when she has a followup cardiology appointment.  I plan to see her back when necessary. She will start in cardiac rehabilitation early next week   Jahnae Mcadoo B 08/15/2012 2:47 PM

## 2012-08-19 ENCOUNTER — Encounter (HOSPITAL_COMMUNITY): Payer: Medicare Other

## 2012-08-20 ENCOUNTER — Encounter: Payer: Medicare Other | Admitting: Thoracic Surgery (Cardiothoracic Vascular Surgery)

## 2012-08-21 ENCOUNTER — Encounter (HOSPITAL_COMMUNITY): Payer: Medicare Other

## 2012-08-22 ENCOUNTER — Encounter (HOSPITAL_COMMUNITY)
Admission: RE | Admit: 2012-08-22 | Discharge: 2012-08-22 | Disposition: A | Discharge status: Home / Self Care | Payer: Medicare Other | Source: Ambulatory Visit | Attending: Cardiovascular Disease | Admitting: Cardiovascular Disease

## 2012-08-22 DIAGNOSIS — Z5189 Encounter for other specified aftercare: Secondary | ICD-10-CM | POA: Insufficient documentation

## 2012-08-22 DIAGNOSIS — I10 Essential (primary) hypertension: Secondary | ICD-10-CM | POA: Insufficient documentation

## 2012-08-22 DIAGNOSIS — I359 Nonrheumatic aortic valve disorder, unspecified: Secondary | ICD-10-CM | POA: Insufficient documentation

## 2012-08-22 DIAGNOSIS — I2510 Atherosclerotic heart disease of native coronary artery without angina pectoris: Secondary | ICD-10-CM | POA: Insufficient documentation

## 2012-08-22 DIAGNOSIS — E785 Hyperlipidemia, unspecified: Secondary | ICD-10-CM | POA: Insufficient documentation

## 2012-08-22 NOTE — Progress Notes (Signed)
Cardiac Rehab Medication Review by a Pharmacist  Does the patient  feel that his/her medications are working for him/her?  yes  Has the patient been experiencing any side effects to the medications prescribed?  Yes- had shakiness and dizziness after insulin when checked CBG it was 37  Does the patient measure his/her own blood pressure or blood glucose at home?  yes   Does the patient have any problems obtaining medications due to transportation or finances?   yes  Understanding of regimen: good Understanding of indications: good Potential of compliance: good    Pharmacist comments:  Medications were reviewed with patient. Patient had good understanding of her medications and indications. Patient did express hypoglycemic episode the other day with CBG in 30's. Patient expressed she will be visiting her PCP on Tuesday and will address this with him and her insulin regimen.    Dub Mikes 08/22/2012 8:25 AM

## 2012-08-23 ENCOUNTER — Encounter (HOSPITAL_COMMUNITY): Payer: Medicare Other

## 2012-08-26 ENCOUNTER — Encounter (HOSPITAL_COMMUNITY)
Admission: RE | Admit: 2012-08-26 | Discharge: 2012-08-26 | Disposition: A | Discharge status: Home / Self Care | Payer: Medicare Other | Source: Ambulatory Visit | Attending: Cardiovascular Disease | Admitting: Cardiovascular Disease

## 2012-08-26 NOTE — Progress Notes (Signed)
Pt started cardiac rehab today.  Pt tolerated light exercise without difficulty. Telemetry rhythm sinus without ectopy. Vital signs stable.Will continue to monitor the patient throughout  the program.

## 2012-08-28 ENCOUNTER — Encounter (HOSPITAL_COMMUNITY)
Admission: RE | Admit: 2012-08-28 | Discharge: 2012-08-28 | Disposition: A | Discharge status: Home / Self Care | Payer: Medicare Other | Source: Ambulatory Visit | Attending: Cardiovascular Disease | Admitting: Cardiovascular Disease

## 2012-08-28 ENCOUNTER — Telehealth: Payer: Self-pay | Admitting: *Deleted

## 2012-08-28 NOTE — Telephone Encounter (Signed)
It looks like she is still on Lasix 40 and sounds like she is volume depleted.  Have her stop the lasix for now.

## 2012-08-28 NOTE — Telephone Encounter (Signed)
Byrd Hesselbach is aware that we will call the pt home after getting respond from Dr. Elease Hashimoto MD. Pt's BP now 112/70. Byrd Hesselbach will send pt home.

## 2012-08-28 NOTE — Telephone Encounter (Signed)
Pt states is taken Lasix 20 mg daily. Pt is aware to stop the lasix for now.

## 2012-08-28 NOTE — Progress Notes (Signed)
Exit bp 112/60

## 2012-08-28 NOTE — Progress Notes (Signed)
[name]'s blood pressure was noted in the upper 90's. Patient complained of feeling tired today and was tearful because she feels people that are older than her and are doing better.  Patient was reassured that she will gain confidence as she continues to progress in the program. Sitting blood pressure 104/68.  Standing blood pressure 84/60. Patient denied feeling dizzy and was given water recheck blood pressure 94/60.  Dr Harvie Bridge office called and notified of today's blood pressures spoke with the triage nurse. Will fax exercise flow sheets to Dr. Harvie Bridge office for review .

## 2012-08-28 NOTE — Telephone Encounter (Signed)
[name] from cardiac rehab called regarding pt. Pt came in for cardiac rehab when she was in the thredmill ; BP was 100/60 to 98/60'  in the bike BP112/70, pt started to feel bad and crying. Byrd Hesselbach RN took orthostatic BP: sitting 104/68 standing 84/60 heart rate 67 beats/minute. Pt drenk water now BP is 94/70 Pt states she has been light headed at home. Pt's weight Monday was 81.6 KG today is 79 KG.

## 2012-08-29 ENCOUNTER — Telehealth: Payer: Self-pay | Admitting: *Deleted

## 2012-08-29 DIAGNOSIS — I2510 Atherosclerotic heart disease of native coronary artery without angina pectoris: Secondary | ICD-10-CM

## 2012-08-29 NOTE — Telephone Encounter (Signed)
K+ to be held, pt informed.

## 2012-08-29 NOTE — Telephone Encounter (Signed)
Message copied by Antony Odea on Thu Aug 29, 2012  3:15 PM ------      Message from: Cammy Copa      Created: Thu Aug 29, 2012 10:09 AM      Regarding: potassium       Good morning [name],            Dr Elease Hashimoto stopped [name]'s lasix yesterday she had some orthostatic changes at cardiac rehab yesterday. Would you mind asking Dr Melburn Popper is the patient needs to hold her potassium as well? She is taking 10 meq per day.            Thanks for your assistance,            Byrd Hesselbach ------

## 2012-08-30 ENCOUNTER — Encounter (HOSPITAL_COMMUNITY)
Admission: RE | Admit: 2012-08-30 | Discharge: 2012-08-30 | Disposition: A | Discharge status: Home / Self Care | Payer: Medicare Other | Source: Ambulatory Visit | Attending: Cardiovascular Disease | Admitting: Cardiovascular Disease

## 2012-08-30 DIAGNOSIS — I10 Essential (primary) hypertension: Secondary | ICD-10-CM | POA: Insufficient documentation

## 2012-08-30 DIAGNOSIS — E785 Hyperlipidemia, unspecified: Secondary | ICD-10-CM | POA: Insufficient documentation

## 2012-08-30 DIAGNOSIS — I2510 Atherosclerotic heart disease of native coronary artery without angina pectoris: Secondary | ICD-10-CM | POA: Insufficient documentation

## 2012-08-30 DIAGNOSIS — I359 Nonrheumatic aortic valve disorder, unspecified: Secondary | ICD-10-CM | POA: Insufficient documentation

## 2012-08-30 DIAGNOSIS — Z5189 Encounter for other specified aftercare: Secondary | ICD-10-CM | POA: Insufficient documentation

## 2012-08-30 LAB — GLUCOSE, CAPILLARY: Glucose-Capillary: 152 mg/dL — ABNORMAL HIGH (ref 70–99)

## 2012-09-02 ENCOUNTER — Encounter (HOSPITAL_COMMUNITY)
Admission: RE | Admit: 2012-09-02 | Discharge: 2012-09-02 | Disposition: A | Discharge status: Home / Self Care | Payer: Medicare Other | Source: Ambulatory Visit | Attending: Cardiovascular Disease | Admitting: Cardiovascular Disease

## 2012-09-02 LAB — GLUCOSE, CAPILLARY: Glucose-Capillary: 190 mg/dL — ABNORMAL HIGH (ref 70–99)

## 2012-09-04 ENCOUNTER — Encounter (HOSPITAL_COMMUNITY)
Admission: RE | Admit: 2012-09-04 | Discharge: 2012-09-04 | Disposition: A | Discharge status: Home / Self Care | Payer: Medicare Other | Source: Ambulatory Visit | Attending: Cardiovascular Disease | Admitting: Cardiovascular Disease

## 2012-09-04 LAB — GLUCOSE, CAPILLARY
Glucose-Capillary: 139 mg/dL — ABNORMAL HIGH (ref 70–99)
Glucose-Capillary: 122 mg/dL — ABNORMAL HIGH (ref 70–99)

## 2012-09-06 ENCOUNTER — Encounter (HOSPITAL_COMMUNITY)
Admission: RE | Admit: 2012-09-06 | Discharge: 2012-09-06 | Disposition: A | Discharge status: Home / Self Care | Payer: Medicare Other | Source: Ambulatory Visit | Attending: Cardiovascular Disease | Admitting: Cardiovascular Disease

## 2012-09-09 ENCOUNTER — Encounter (HOSPITAL_COMMUNITY): Payer: Medicare Other

## 2012-09-09 ENCOUNTER — Ambulatory Visit (HOSPITAL_COMMUNITY): Payer: Medicare Other | Attending: Cardiology | Admitting: Radiology

## 2012-09-09 ENCOUNTER — Other Ambulatory Visit: Payer: Self-pay

## 2012-09-09 DIAGNOSIS — E785 Hyperlipidemia, unspecified: Secondary | ICD-10-CM | POA: Insufficient documentation

## 2012-09-09 DIAGNOSIS — Z951 Presence of aortocoronary bypass graft: Secondary | ICD-10-CM

## 2012-09-09 DIAGNOSIS — Z952 Presence of prosthetic heart valve: Secondary | ICD-10-CM

## 2012-09-09 DIAGNOSIS — Z79899 Other long term (current) drug therapy: Secondary | ICD-10-CM

## 2012-09-09 DIAGNOSIS — I10 Essential (primary) hypertension: Secondary | ICD-10-CM | POA: Insufficient documentation

## 2012-09-09 DIAGNOSIS — E119 Type 2 diabetes mellitus without complications: Secondary | ICD-10-CM | POA: Insufficient documentation

## 2012-09-09 DIAGNOSIS — R7989 Other specified abnormal findings of blood chemistry: Secondary | ICD-10-CM

## 2012-09-09 DIAGNOSIS — Z09 Encounter for follow-up examination after completed treatment for conditions other than malignant neoplasm: Secondary | ICD-10-CM | POA: Insufficient documentation

## 2012-09-09 DIAGNOSIS — I359 Nonrheumatic aortic valve disorder, unspecified: Secondary | ICD-10-CM

## 2012-09-09 NOTE — Progress Notes (Signed)
Echocardiogram performed.  

## 2012-09-11 ENCOUNTER — Encounter (HOSPITAL_COMMUNITY)
Admission: RE | Admit: 2012-09-11 | Discharge: 2012-09-11 | Disposition: A | Discharge status: Home / Self Care | Payer: Medicare Other | Source: Ambulatory Visit | Attending: Cardiovascular Disease | Admitting: Cardiovascular Disease

## 2012-09-11 NOTE — Progress Notes (Signed)
  [name] 79 y.o. female Nutrition Note Spoke with pt. MEDFICTS completed with pt. Nutrition Survey goals reviewed with pt. Pt is following Step 2 of the Therapeutic Lifestyle Changes diet. Pt reports having made changes to her diet since her heart event. Pt eats a hot dog, ground chuck, and fried chicken weekly and uses whole or reduced fat milk. Age-appropriate dietary recommendations discussed. Pt wants to lose wt. Per pt, "I don't like many vegetables." Pt states she is going to bed "at 8 o'clock because I'm hungry and I don't want to eat." Suspect pt hungry due to decreased food consumption earlier in the day, given pt eats [place] yogurt and a 1/2 of a banana in the morning. Pt encouraged to incorporate more food earlier in the day to help with night time "munchies." Pt reports her sister "is making me walk at [employer] on my non-rehab days." Pt is diabetic. Last A1c indicates blood glucose not optimally controlled. Age-appropriate A1c goal discussed. Pt expressed understanding of the information reviewed. Pt aware of nutrition education classes offered.  Nutrition Diagnosis   Food-and nutrition-related knowledge deficit related to lack of exposure to information as related to diagnosis of: ? CVD ? DM (A1c 7.7)   Obesity related to excessive energy intake as evidenced by a BMI of 34.4  Nutrition RX/ Estimated Daily Nutrition Needs for: wt loss  1200-1350 Kcal, 30-35 gm fat, 8-10 gm sat fat, 1.1-1.4 gm trans-fat, <1500 mg sodium, 150-175 gm CHO   Nutrition Intervention   Benefits of adopting Therapeutic Lifestyle Changes discussed when Medficts reviewed.   Pt to attend the Portion Distortion class   Pt to attend the  ? Nutrition I class                     ? Nutrition II class        ? Diabetes Blitz class        ? Diabetes Q & A class - met 09/06/12   Continue client-centered nutrition education by RD, as part of interdisciplinary care. Goal(s)   Pt to identify food quantities necessary  to achieve: ? wt loss to a goal wt of 154-172 lb (70.4-78.6 kg) at graduation from cardiac rehab.    CBG concentrations in the normal range or as close to normal as is safely possible. Monitor and Evaluate progress toward nutrition goal with team. Nutrition Risk: Change to Moderate   Mickle Plumb, M.Ed, RD, LDN, CDE 09/11/2012 10:56 AM

## 2012-09-12 ENCOUNTER — Ambulatory Visit (INDEPENDENT_AMBULATORY_CARE_PROVIDER_SITE_OTHER): Payer: Medicare Other | Admitting: Cardiovascular Disease

## 2012-09-12 ENCOUNTER — Encounter: Payer: Self-pay | Admitting: Cardiovascular Disease

## 2012-09-12 VITALS — BP 124/64 | HR 60 | Ht 60.0 in | Wt 178.8 lb

## 2012-09-12 DIAGNOSIS — E876 Hypokalemia: Secondary | ICD-10-CM

## 2012-09-12 DIAGNOSIS — I359 Nonrheumatic aortic valve disorder, unspecified: Secondary | ICD-10-CM

## 2012-09-12 DIAGNOSIS — Z951 Presence of aortocoronary bypass graft: Secondary | ICD-10-CM

## 2012-09-12 DIAGNOSIS — D649 Anemia, unspecified: Secondary | ICD-10-CM

## 2012-09-12 DIAGNOSIS — I350 Nonrheumatic aortic (valve) stenosis: Secondary | ICD-10-CM

## 2012-09-12 LAB — BASIC METABOLIC PANEL
BUN: 16 mg/dL (ref 6–23)
CO2: 27 mEq/L (ref 19–32)
Chloride: 103 mEq/L (ref 96–112)
Creatinine, Ser: 0.9 mg/dL (ref 0.4–1.2)

## 2012-09-12 LAB — CBC
HCT: 30.1 % — ABNORMAL LOW (ref 36.0–46.0)
Hemoglobin: 10.1 g/dL — ABNORMAL LOW (ref 12.0–15.0)
MCV: 90.1 fl (ref 78.0–100.0)
RBC: 3.34 Mil/uL — ABNORMAL LOW (ref 3.87–5.11)
RDW: 17.2 % — ABNORMAL HIGH (ref 11.5–14.6)

## 2012-09-12 NOTE — Assessment & Plan Note (Signed)
She is doing well s/p AVR.  Will check labs today.

## 2012-09-12 NOTE — Assessment & Plan Note (Addendum)
[name] is doing well. Did have some postop anemia. Her last hemoglobin was around 9. We'll recheck her CBC today.  Overall she seems to be doing better following her surgery. She has a small area of swelling at one of her status and her sites. It does not appear to be infected. I suspect it is a small hematoma.  She will check with Dr. Dennie Maizes office.

## 2012-09-12 NOTE — Progress Notes (Signed)
[name] Date of Birth: [birth year] Medical Record #[id number]  History of Present Illness: Ms. [name] is seen back today for a 2 week check.  She has had recent AVR and CABG x 2 per Dr. Tyrone Sage. This was with a LIMA to the LAD and SVG to the LCX with a pericardial Edwards tissue valve #21 mm. Did have post op atrial fib - on amiodarone. Other issues include past AS, DM, HLD, HTN and hypothryoidism.   I saw her 2 weeks ago. I gave her a short course of Lasix for some volume overload. We also stopped her amiodarone due to prolonged QT.   She comes back today. She is here alone. Weight is down 7 pounds. She is doing well. Feeling better and stronger. Still with some pedal edema - says she did not have this prior to her surgery. Still taking 40 mg of Lasix.  Walking in her house. Little scared about venturing outside. Ready to go to cardiac rehab. Not dizzy or lightheaded. No shortness of breath.   Sep 12, 2012:  [name] is doing well after her CABG and AVR.   She has a swollen area at one of the SVG harvest sites.  No redness. Mildly tender.    She has had some post op anemia.  Her potassium has been low.    Current Outpatient Prescriptions on File Prior to Visit  Medication Sig Dispense Refill  . aspirin EC 325 MG EC tablet Take 1 tablet (325 mg total) by mouth daily.  30 tablet    . benazepril (LOTENSIN) 20 MG tablet Take 1 tablet (20 mg total) by mouth daily.  30 tablet    . calcium carbonate (TUMS) 500 MG chewable tablet Chew 1 tablet by mouth daily as needed for heartburn (indigestion).      . carvedilol (COREG) 25 MG tablet Take 1 tablet (25 mg total) by mouth 2 (two) times daily.      . Coenzyme Q10 (CO Q-10) 100 MG CAPS Take 1 capsule by mouth 2 (two) times daily.      . felodipine (PLENDIL) 10 MG 24 hr tablet Take 1 tablet (10 mg total) by mouth daily.  90 tablet  3  . insulin glargine (LANTUS) 100 UNIT/ML injection Inject 18 Units into the skin every morning.       .  insulin lispro (HUMALOG) 100 UNIT/ML injection Inject 4-6 Units into the skin 3 (three) times daily. Pt takes 6 units with each meal, can take as low 4 units in the evening it just depends on what she's eating      . levothyroxine (SYNTHROID, LEVOTHROID) 100 MCG tablet Take 125 mcg by mouth daily.       . metFORMIN (GLUCOPHAGE-XR) 750 MG 24 hr tablet Take 750 mg by mouth 2 (two) times daily.      . Multiple Vitamins-Minerals (MULTIVITAMINS THER. W/MINERALS) TABS Take 1 tablet by mouth daily.      . rosuvastatin (CRESTOR) 20 MG tablet Take 20 mg by mouth every evening.        No current facility-administered medications on file prior to visit.    No Known Allergies  Past Medical History  Diagnosis Date  . Thyroid disease   . Diabetes mellitus   . Lumbago   . Aortic valve disorders     S/P AVR 06/2012  . Hyperlipidemia   . Heart murmur   . Hypertension   . Hypothyroidism   . Depression   . Shortness of  breath   . Asthma     AS CHILD   . Arthritis     Past Surgical History  Procedure Laterality Date  . Tubal ligation    . Breast surgery      LT BREAST BX BENIGN  . Aortic valve replacement N/A 06/25/2012    Procedure: AORTIC VALVE REPLACEMENT (AVR);  Surgeon: [name], MD;  Location: [facility] OR;  Service: Open Heart Surgery;  Laterality: N/A;  . Coronary artery bypass graft N/A 06/25/2012    Procedure: Coronary artery bypass graft  times two using left internal mammary artery and right leg greater saphenous vein;  Surgeon: [name], MD;  Location[facility] OR;  Service: Open Heart Surgery;  Laterality: N/A;  . Intraoperative transesophageal echocardiogram N/A 06/25/2012    Procedure: INTRAOPERATIVE TRANSESOPHAGEAL ECHOCARDIOGRAM;  Surgeon: [name], MD;  Location: [facility] OR;  Service: Open Heart Surgery;  Laterality: N/A;    History  Smoking status  . Never Smoker   Smokeless tobacco  . Not on file    History  Alcohol Use No    Family History  Problem  Relation Age of Onset  . Heart disease Father     Review of Systems: The review of systems is per the HPI.  All other systems were reviewed and are negative.  Physical Exam: BP 124/64  Pulse 60  Ht 5' (1.524 m)  Wt 178 lb 12.8 oz (81.103 kg)  BMI 34.92 kg/m2 Patient is very pleasant and in no acute distress. Skin is warm and dry. Color is normal.  HEENT is unremarkable. Normocephalic/atraumatic. PERRL. Sclera are nonicteric. Neck is supple. No masses. No JVD. Lungs are clear. Cardiac exam shows a regular rate and rhythm.  Soft systolic murmur. Abdomen is soft. Extremities are without edema. Gait and ROM are intact. No gross neurologic deficits noted.  LABORATORY DATA: Pending   Lab Results  Component Value Date   WBC 6.6 07/31/2012   HGB 9.0* 07/31/2012   HCT 27.5* 07/31/2012   PLT 328.0 07/31/2012   GLUCOSE 112* 07/31/2012   ALT 12 06/21/2012   AST 23 06/21/2012   NA 136 07/31/2012   K 3.2* 07/31/2012   CL 97 07/31/2012   CREATININE 1.0 07/31/2012   BUN 12 07/31/2012   CO2 28 07/31/2012   TSH 6.85* 07/31/2012   INR 1.57* 06/25/2012   HGBA1C 7.7* 06/21/2012   Assessment / Plan:

## 2012-09-12 NOTE — Patient Instructions (Addendum)
Your physician wants you to follow-up in: 6 months  You will receive a reminder letter in the mail two months in advance. If you don't receive a letter, please call our office to schedule the follow-up appointment.  Your physician recommends that you return for lab work in: today bmet cbc  Your physician recommends that you continue on your current medications as directed. Please refer to the Current Medication list given to you today.

## 2012-09-13 ENCOUNTER — Encounter (HOSPITAL_COMMUNITY)
Admission: RE | Admit: 2012-09-13 | Discharge: 2012-09-13 | Disposition: A | Discharge status: Home / Self Care | Payer: Medicare Other | Source: Ambulatory Visit | Attending: Cardiovascular Disease | Admitting: Cardiovascular Disease

## 2012-09-13 LAB — GLUCOSE, CAPILLARY: Glucose-Capillary: 139 mg/dL — ABNORMAL HIGH (ref 70–99)

## 2012-09-16 ENCOUNTER — Encounter (HOSPITAL_COMMUNITY)
Admission: RE | Admit: 2012-09-16 | Discharge: 2012-09-16 | Disposition: A | Discharge status: Home / Self Care | Payer: Medicare Other | Source: Ambulatory Visit | Attending: Cardiovascular Disease | Admitting: Cardiovascular Disease

## 2012-09-16 ENCOUNTER — Telehealth: Payer: Self-pay | Admitting: *Deleted

## 2012-09-16 NOTE — Telephone Encounter (Signed)
NOTED/ WILL WATCH FOR RHYTHM STRIP.

## 2012-09-16 NOTE — Progress Notes (Signed)
[name] reports having indigestion in her neck and throat area this morning.  [name] reports the discomfort goes away after taking tums.  Telemetry normal sinus rhythm. [name] reported a relief of symptoms after walking on the track at cardiac rehab.  Blood pressure 122/70.  Dr Harvie Bridge office called and notified.  [name] reports having intermittent symptoms for the past week and a half. Will fax ECG tracing to Dr Elease Hashimoto and have Mrs Azure follow up with her primary care physician,  Dr August Saucer.

## 2012-09-16 NOTE — Telephone Encounter (Signed)
Call from Byrd Hesselbach, RN at cardiac rehab. The patient has reported to her this morning that she has had some indigestion type symptoms/ chest discomfort over the last week and a half. She has taken TUMS and had some relief of her symptoms with this. She did not mention these symptoms to Dr. Elease Hashimoto when she was in the office last week. Per Byrd Hesselbach, she has called Dr. Diamantina Providence office to see if her can see her as she is not on a PPI. She wanted to know if this was ok, I advised ok to see PCP. The patient is not having symptoms today. She wanted to know could the patient exercise or should she see Dr. August Saucer first. I advised if no symptoms, she should be ok to exercise and make sure symptoms not reproducible during activity. Per Byrd Hesselbach, she will send a rhythm strip to Dr. Elease Hashimoto, but no apparent changes noted. Will forward to Dr. Elease Hashimoto and Michael Litter.

## 2012-09-16 NOTE — Progress Notes (Signed)
[name] has an appointment to see Dr August Saucer today at 3:30pm. Will fax exercise flow sheets to Dr. Diamantina Providence office for review. No further complaints voiced today during exercise at cardiac rehab

## 2012-09-18 ENCOUNTER — Encounter (HOSPITAL_COMMUNITY)
Admission: RE | Admit: 2012-09-18 | Discharge: 2012-09-18 | Disposition: A | Discharge status: Home / Self Care | Payer: Medicare Other | Source: Ambulatory Visit | Attending: Cardiovascular Disease | Admitting: Cardiovascular Disease

## 2012-09-18 NOTE — Telephone Encounter (Signed)
Dr reviewed and was ok, taken to MR to scan

## 2012-09-20 ENCOUNTER — Encounter (HOSPITAL_COMMUNITY)
Admission: RE | Admit: 2012-09-20 | Discharge: 2012-09-20 | Disposition: A | Discharge status: Home / Self Care | Payer: Medicare Other | Source: Ambulatory Visit | Attending: Cardiovascular Disease | Admitting: Cardiovascular Disease

## 2012-09-23 ENCOUNTER — Encounter (HOSPITAL_COMMUNITY): Payer: Medicare Other

## 2012-09-25 ENCOUNTER — Encounter (HOSPITAL_COMMUNITY)
Admission: RE | Admit: 2012-09-25 | Discharge: 2012-09-25 | Disposition: A | Discharge status: Home / Self Care | Payer: Medicare Other | Source: Ambulatory Visit | Attending: Cardiovascular Disease | Admitting: Cardiovascular Disease

## 2012-09-25 LAB — GLUCOSE, CAPILLARY: Glucose-Capillary: 115 mg/dL — ABNORMAL HIGH (ref 70–99)

## 2012-09-27 ENCOUNTER — Encounter (HOSPITAL_COMMUNITY)
Admission: RE | Admit: 2012-09-27 | Discharge: 2012-09-27 | Disposition: A | Discharge status: Home / Self Care | Payer: Medicare Other | Source: Ambulatory Visit | Attending: Cardiovascular Disease | Admitting: Cardiovascular Disease

## 2012-09-27 NOTE — Progress Notes (Signed)
[name] blood sugar was 57 post exercise today.  Patient given a banana and lemonade.  Repeat blood sugar was 118.  [name] said she only had yogurt this morning.  Patient was counseled by the dietitian.  Dr Remus Blake office called and notified of low CBG at cardiac rehab. [name] left cardiac rehab without complaints.

## 2012-09-30 ENCOUNTER — Encounter (HOSPITAL_COMMUNITY)
Admission: RE | Admit: 2012-09-30 | Discharge: 2012-09-30 | Disposition: A | Discharge status: Home / Self Care | Payer: Medicare Other | Source: Ambulatory Visit | Attending: Cardiovascular Disease | Admitting: Cardiovascular Disease

## 2012-09-30 DIAGNOSIS — I359 Nonrheumatic aortic valve disorder, unspecified: Secondary | ICD-10-CM | POA: Insufficient documentation

## 2012-09-30 DIAGNOSIS — I10 Essential (primary) hypertension: Secondary | ICD-10-CM | POA: Insufficient documentation

## 2012-09-30 DIAGNOSIS — Z5189 Encounter for other specified aftercare: Secondary | ICD-10-CM | POA: Insufficient documentation

## 2012-09-30 DIAGNOSIS — I2510 Atherosclerotic heart disease of native coronary artery without angina pectoris: Secondary | ICD-10-CM | POA: Insufficient documentation

## 2012-09-30 DIAGNOSIS — E785 Hyperlipidemia, unspecified: Secondary | ICD-10-CM | POA: Insufficient documentation

## 2012-09-30 LAB — GLUCOSE, CAPILLARY: Glucose-Capillary: 227 mg/dL — ABNORMAL HIGH (ref 70–99)

## 2012-09-30 NOTE — Progress Notes (Signed)
Reviewed home exercise with pt today.  Pt plans to walk at home for exercise.  Reviewed THR, pulse (needs practice, class on Friday), RPE, sign and symptoms, and when to call 911 or MD.  Pt voiced understanding. Holman Bonsignore, MA, ACSM RCEP  

## 2012-10-02 ENCOUNTER — Encounter (HOSPITAL_COMMUNITY)
Admission: RE | Admit: 2012-10-02 | Discharge: 2012-10-02 | Disposition: A | Discharge status: Home / Self Care | Payer: Medicare Other | Source: Ambulatory Visit | Attending: Cardiovascular Disease | Admitting: Cardiovascular Disease

## 2012-10-04 ENCOUNTER — Encounter (HOSPITAL_COMMUNITY)
Admission: RE | Admit: 2012-10-04 | Discharge: 2012-10-04 | Disposition: A | Discharge status: Home / Self Care | Payer: Medicare Other | Source: Ambulatory Visit | Attending: Cardiovascular Disease | Admitting: Cardiovascular Disease

## 2012-10-04 LAB — GLUCOSE, CAPILLARY: Glucose-Capillary: 179 mg/dL — ABNORMAL HIGH (ref 70–99)

## 2012-10-04 NOTE — Progress Notes (Signed)
[name] had a short nonsustained of bigeminy today during exercise at cardiac rehab.  Patient asymptomatic, vital signs stable . Will fax exercise flow sheets to Dr. Harvie Bridge  office for review.

## 2012-10-07 ENCOUNTER — Encounter (HOSPITAL_COMMUNITY)
Admission: RE | Admit: 2012-10-07 | Discharge: 2012-10-07 | Disposition: A | Discharge status: Home / Self Care | Payer: Medicare Other | Source: Ambulatory Visit | Attending: Cardiovascular Disease | Admitting: Cardiovascular Disease

## 2012-10-07 LAB — GLUCOSE, CAPILLARY: Glucose-Capillary: 130 mg/dL — ABNORMAL HIGH (ref 70–99)

## 2012-10-09 ENCOUNTER — Encounter (HOSPITAL_COMMUNITY)
Admission: RE | Admit: 2012-10-09 | Discharge: 2012-10-09 | Disposition: A | Discharge status: Home / Self Care | Payer: Medicare Other | Source: Ambulatory Visit | Attending: Cardiovascular Disease | Admitting: Cardiovascular Disease

## 2012-10-11 ENCOUNTER — Encounter (HOSPITAL_COMMUNITY)
Admission: RE | Admit: 2012-10-11 | Discharge: 2012-10-11 | Disposition: A | Discharge status: Home / Self Care | Payer: Medicare Other | Source: Ambulatory Visit | Attending: Cardiovascular Disease | Admitting: Cardiovascular Disease

## 2012-10-14 ENCOUNTER — Encounter (HOSPITAL_COMMUNITY)
Admission: RE | Admit: 2012-10-14 | Discharge: 2012-10-14 | Disposition: A | Discharge status: Home / Self Care | Payer: Medicare Other | Source: Ambulatory Visit | Attending: Cardiovascular Disease | Admitting: Cardiovascular Disease

## 2012-10-14 LAB — GLUCOSE, CAPILLARY: Glucose-Capillary: 176 mg/dL — ABNORMAL HIGH (ref 70–99)

## 2012-10-16 ENCOUNTER — Encounter (HOSPITAL_COMMUNITY)
Admission: RE | Admit: 2012-10-16 | Discharge: 2012-10-16 | Disposition: A | Discharge status: Home / Self Care | Payer: Medicare Other | Source: Ambulatory Visit | Attending: Cardiovascular Disease | Admitting: Cardiovascular Disease

## 2012-10-18 ENCOUNTER — Encounter (HOSPITAL_COMMUNITY)
Admission: RE | Admit: 2012-10-18 | Discharge: 2012-10-18 | Disposition: A | Discharge status: Home / Self Care | Payer: Medicare Other | Source: Ambulatory Visit | Attending: Cardiovascular Disease | Admitting: Cardiovascular Disease

## 2012-10-21 ENCOUNTER — Telehealth: Payer: Self-pay | Admitting: *Deleted

## 2012-10-21 ENCOUNTER — Encounter (HOSPITAL_COMMUNITY)
Admission: RE | Admit: 2012-10-21 | Discharge: 2012-10-21 | Disposition: A | Discharge status: Home / Self Care | Payer: Medicare Other | Source: Ambulatory Visit | Attending: Cardiovascular Disease | Admitting: Cardiovascular Disease

## 2012-10-21 ENCOUNTER — Telehealth (HOSPITAL_COMMUNITY): Payer: Self-pay | Admitting: *Deleted

## 2012-10-21 DIAGNOSIS — R002 Palpitations: Secondary | ICD-10-CM

## 2012-10-21 LAB — GLUCOSE, CAPILLARY: Glucose-Capillary: 173 mg/dL — ABNORMAL HIGH (ref 70–99)

## 2012-10-21 NOTE — Telephone Encounter (Signed)
lmtcb

## 2012-10-21 NOTE — Progress Notes (Signed)
[name] was tearful this morning c/o having palpitations.  Increased PVC's noted on the monitor.  [name] informed me prior to exercise that her workloads were moved up this past Friday and she feels it is too hard for her. I told [name] we would move her workloads down to make her feel comfortable with exercise. I had [name] to sit down and rest. Encouragement given to the patient. Dales symptoms resolved after rest. Blood pressure 118/60. I called Dr Harvie Bridge office to notify him of the patients complaints spoke with [name] Dr Estill Bakes nurse. Exercise flow sheets faxed to  Dr Harvie Bridge office for review with today's ECG tracings. [name] left cardiac rehab without complaints. Decreased PVC's noted upon exit from cardiac rehab. Will continue to monitor the patient throughout  the program.

## 2012-10-21 NOTE — Telephone Encounter (Signed)
RECEIVED CALL FROM MARIA/ CARDIAC REHAB. PT C/O PALPITATIONS AS SEEN ON STRIPS PER MARIA RN PT WAS CALLED/ MSG LEFT TO CALL BACK, NEEDS BMET DRAWN AND MED REVIEW/ LASIX AND K+.  WILL TRY LATER TO CON5TACT

## 2012-10-22 ENCOUNTER — Other Ambulatory Visit (INDEPENDENT_AMBULATORY_CARE_PROVIDER_SITE_OTHER): Payer: Medicare Other

## 2012-10-22 DIAGNOSIS — R002 Palpitations: Secondary | ICD-10-CM

## 2012-10-22 LAB — BASIC METABOLIC PANEL
CO2: 26 mEq/L (ref 19–32)
Chloride: 103 mEq/L (ref 96–112)
Creatinine, Ser: 0.9 mg/dL (ref 0.4–1.2)

## 2012-10-22 NOTE — Telephone Encounter (Signed)
Pt is aware of MD's recommendations. She is aware of today's normal lab results. Pt  would like for MD to check to see why she is having the palpitations. Pt wants to be call at her Cell phone at [phone].

## 2012-10-22 NOTE — Telephone Encounter (Signed)
She should try elevating legs ( higher than her heart) see see if that resolves some of the heaviness.

## 2012-10-22 NOTE — Telephone Encounter (Signed)
Spoke with pt she said she feels very tired all the time ; her legs feel very heavy. Pt states is not taken Lasix nor potassium. According to pt, these medications had been discontinued.  Pt is aware to come to have   Blood work Financial trader )  this AM.

## 2012-10-22 NOTE — Telephone Encounter (Signed)
Follow up   Pt is returning a call from yesterday.

## 2012-10-22 NOTE — Telephone Encounter (Signed)
F/u   Pt called back and stated no one called her back yesterday regarding palpatations/an appt

## 2012-10-22 NOTE — Telephone Encounter (Signed)
Left pt a message to call back. 

## 2012-10-23 ENCOUNTER — Encounter (HOSPITAL_COMMUNITY)
Admission: RE | Admit: 2012-10-23 | Discharge: 2012-10-23 | Disposition: A | Discharge status: Home / Self Care | Payer: Medicare Other | Source: Ambulatory Visit | Attending: Cardiovascular Disease | Admitting: Cardiovascular Disease

## 2012-10-23 NOTE — Telephone Encounter (Signed)
Pt was called on cell number, voice mail box not set up. No msg can be left at this number, no answer on home number.

## 2012-10-23 NOTE — Telephone Encounter (Signed)
Pt was called at cardiac rehab, pt feeling better today, told pt to stay hydrated with non cafeinated fluids and avoid caffeine even chocolate. Pt agreed to plan, labs reviewed, pt

## 2012-10-23 NOTE — Telephone Encounter (Signed)
msg left on home number to call back.

## 2012-10-23 NOTE — Progress Notes (Signed)
[name] exercised without complaints today.  Less ectopy noted on the monitor.  Vital signs stable. [name] exercised on decreased work loads today. Will continue to monitor the patient throughout  the program.

## 2012-10-25 ENCOUNTER — Encounter (HOSPITAL_COMMUNITY)
Admission: RE | Admit: 2012-10-25 | Discharge: 2012-10-25 | Disposition: A | Discharge status: Home / Self Care | Payer: Medicare Other | Source: Ambulatory Visit | Attending: Cardiovascular Disease | Admitting: Cardiovascular Disease

## 2012-10-28 ENCOUNTER — Encounter (HOSPITAL_COMMUNITY)
Admission: RE | Admit: 2012-10-28 | Discharge: 2012-10-28 | Disposition: A | Discharge status: Home / Self Care | Payer: Medicare Other | Source: Ambulatory Visit | Attending: Cardiovascular Disease | Admitting: Cardiovascular Disease

## 2012-10-28 LAB — GLUCOSE, CAPILLARY: Glucose-Capillary: 181 mg/dL — ABNORMAL HIGH (ref 70–99)

## 2012-10-30 ENCOUNTER — Encounter (HOSPITAL_COMMUNITY)
Admission: RE | Admit: 2012-10-30 | Discharge: 2012-10-30 | Disposition: A | Discharge status: Home / Self Care | Payer: Medicare Other | Source: Ambulatory Visit | Attending: Cardiovascular Disease | Admitting: Cardiovascular Disease

## 2012-10-30 DIAGNOSIS — E785 Hyperlipidemia, unspecified: Secondary | ICD-10-CM | POA: Insufficient documentation

## 2012-10-30 DIAGNOSIS — Z5189 Encounter for other specified aftercare: Secondary | ICD-10-CM | POA: Insufficient documentation

## 2012-10-30 DIAGNOSIS — I2510 Atherosclerotic heart disease of native coronary artery without angina pectoris: Secondary | ICD-10-CM | POA: Insufficient documentation

## 2012-10-30 DIAGNOSIS — I10 Essential (primary) hypertension: Secondary | ICD-10-CM | POA: Insufficient documentation

## 2012-10-30 DIAGNOSIS — I359 Nonrheumatic aortic valve disorder, unspecified: Secondary | ICD-10-CM | POA: Insufficient documentation

## 2012-10-30 LAB — GLUCOSE, CAPILLARY: Glucose-Capillary: 139 mg/dL — ABNORMAL HIGH (ref 70–99)

## 2012-11-01 ENCOUNTER — Encounter (HOSPITAL_COMMUNITY): Payer: Medicare Other

## 2012-11-04 ENCOUNTER — Encounter (HOSPITAL_COMMUNITY)
Admission: RE | Admit: 2012-11-04 | Discharge: 2012-11-04 | Disposition: A | Discharge status: Home / Self Care | Payer: Medicare Other | Source: Ambulatory Visit | Attending: Cardiovascular Disease | Admitting: Cardiovascular Disease

## 2012-11-06 ENCOUNTER — Encounter (HOSPITAL_COMMUNITY)
Admission: RE | Admit: 2012-11-06 | Discharge: 2012-11-06 | Disposition: A | Discharge status: Home / Self Care | Payer: Medicare Other | Source: Ambulatory Visit | Attending: Cardiovascular Disease | Admitting: Cardiovascular Disease

## 2012-11-08 ENCOUNTER — Encounter (HOSPITAL_COMMUNITY)
Admission: RE | Admit: 2012-11-08 | Discharge: 2012-11-08 | Disposition: A | Discharge status: Home / Self Care | Payer: Medicare Other | Source: Ambulatory Visit | Attending: Cardiovascular Disease | Admitting: Cardiovascular Disease

## 2012-11-08 LAB — GLUCOSE, CAPILLARY: Glucose-Capillary: 134 mg/dL — ABNORMAL HIGH (ref 70–99)

## 2012-11-11 ENCOUNTER — Encounter (HOSPITAL_COMMUNITY): Payer: Medicare Other

## 2012-11-13 ENCOUNTER — Encounter (HOSPITAL_COMMUNITY)
Admission: RE | Admit: 2012-11-13 | Discharge: 2012-11-13 | Disposition: A | Discharge status: Home / Self Care | Payer: Medicare Other | Source: Ambulatory Visit | Attending: Cardiovascular Disease | Admitting: Cardiovascular Disease

## 2012-11-15 ENCOUNTER — Encounter (HOSPITAL_COMMUNITY)
Admission: RE | Admit: 2012-11-15 | Discharge: 2012-11-15 | Disposition: A | Discharge status: Home / Self Care | Payer: Medicare Other | Source: Ambulatory Visit | Attending: Cardiovascular Disease | Admitting: Cardiovascular Disease

## 2012-11-18 ENCOUNTER — Other Ambulatory Visit: Payer: Self-pay | Admitting: *Deleted

## 2012-11-18 ENCOUNTER — Encounter (HOSPITAL_COMMUNITY)
Admission: RE | Admit: 2012-11-18 | Discharge: 2012-11-18 | Disposition: A | Discharge status: Home / Self Care | Payer: Medicare Other | Source: Ambulatory Visit | Attending: Cardiovascular Disease | Admitting: Cardiovascular Disease

## 2012-11-18 LAB — GLUCOSE, CAPILLARY
Glucose-Capillary: 101 mg/dL — ABNORMAL HIGH (ref 70–99)
Glucose-Capillary: 164 mg/dL — ABNORMAL HIGH (ref 70–99)

## 2012-11-18 MED ORDER — LEVOTHYROXINE SODIUM 112 MCG PO TABS: 112.0000 ug | ORAL_TABLET | Freq: Every day | ORAL | Status: DC

## 2012-11-18 MED ORDER — METFORMIN HCL ER 750 MG PO TB24: 750.0000 mg | ORAL_TABLET | Freq: Two times a day (BID) | ORAL | Status: DC

## 2012-11-19 NOTE — Progress Notes (Signed)
Dales resting heart rate has been noted in the low 50's during her last few exercise sessions. Patient asymptomatic. Vital signs stable. Exercise flow sheets with ECG tracings faxed to Dr Harvie Bridge office for review.

## 2012-11-20 ENCOUNTER — Encounter (HOSPITAL_COMMUNITY)
Admission: RE | Admit: 2012-11-20 | Discharge: 2012-11-20 | Disposition: A | Discharge status: Home / Self Care | Payer: Medicare Other | Source: Ambulatory Visit | Attending: Cardiovascular Disease | Admitting: Cardiovascular Disease

## 2012-11-20 ENCOUNTER — Telehealth: Payer: Self-pay | Admitting: Cardiovascular Disease

## 2012-11-20 MED ORDER — CARVEDILOL 12.5 MG PO TABS: 12.5000 mg | ORAL_TABLET | Freq: Two times a day (BID) | ORAL | Status: DC

## 2012-11-20 NOTE — Telephone Encounter (Signed)
New problem   [name]/Cardiac Rehab need nurse to call her in reference to fax she sent over today. Please call

## 2012-11-20 NOTE — Telephone Encounter (Signed)
Pt is having hr 48-51 asymptomatic/ bp 120/60 Reviewed chart with Dr Lolita Rieger DOD Pt to reduce carvedilol to 12.5 mg bid/ pt informed/ Byrd Hesselbach RN informed

## 2012-11-20 NOTE — Progress Notes (Signed)
[name]'s heart rate was noted at 48 today prior to exercise. Patient asymptomatic, vital signs stable.  Dr Harvie Bridge office called and notified spoke with [name], Dr Harvie Bridge nurse.  Today's ECG tracing faxed to Dr Harvie Bridge office for review. Dr [place] reviewed [name]'s ECG tracings and decreased her Coreg to 12.5 mg twice a day. [name] instructed the patient of her medication change over the phone. No complaints during exercise today. Will continue to monitor the patient throughout  the program. [name] will be absent on Friday and plans to return to exercise on Monday.

## 2012-11-22 ENCOUNTER — Encounter (HOSPITAL_COMMUNITY): Payer: Medicare Other

## 2012-11-25 ENCOUNTER — Encounter (HOSPITAL_COMMUNITY)
Admission: RE | Admit: 2012-11-25 | Discharge: 2012-11-25 | Disposition: A | Discharge status: Home / Self Care | Payer: Medicare Other | Source: Ambulatory Visit | Attending: Cardiovascular Disease | Admitting: Cardiovascular Disease

## 2012-11-27 ENCOUNTER — Encounter (HOSPITAL_COMMUNITY)
Admission: RE | Admit: 2012-11-27 | Discharge: 2012-11-27 | Disposition: A | Discharge status: Home / Self Care | Payer: Medicare Other | Source: Ambulatory Visit | Attending: Cardiovascular Disease | Admitting: Cardiovascular Disease

## 2012-12-06 ENCOUNTER — Encounter (HOSPITAL_COMMUNITY): Admission: RE | Admit: 2012-12-06 | Payer: Self-pay | Source: Ambulatory Visit

## 2012-12-09 ENCOUNTER — Encounter (HOSPITAL_COMMUNITY): Payer: Self-pay | Attending: Cardiovascular Disease

## 2012-12-11 ENCOUNTER — Encounter (HOSPITAL_COMMUNITY): Payer: Self-pay | Admitting: Emergency Medicine

## 2012-12-11 ENCOUNTER — Emergency Department (INDEPENDENT_AMBULATORY_CARE_PROVIDER_SITE_OTHER)
Admission: EM | Admit: 2012-12-11 | Discharge: 2012-12-11 | Disposition: A | Discharge status: Home / Self Care | Payer: Medicare Other | Source: Home / Self Care | Attending: Emergency Medicine | Admitting: Emergency Medicine

## 2012-12-11 ENCOUNTER — Emergency Department (INDEPENDENT_AMBULATORY_CARE_PROVIDER_SITE_OTHER): Payer: Medicare Other

## 2012-12-11 ENCOUNTER — Encounter (HOSPITAL_COMMUNITY): Payer: Self-pay

## 2012-12-11 DIAGNOSIS — M25569 Pain in unspecified knee: Secondary | ICD-10-CM

## 2012-12-11 DIAGNOSIS — M25562 Pain in left knee: Secondary | ICD-10-CM

## 2012-12-11 DIAGNOSIS — M1990 Unspecified osteoarthritis, unspecified site: Secondary | ICD-10-CM

## 2012-12-11 MED ORDER — HYDROCODONE-ACETAMINOPHEN 5-325 MG PO TABS: 2.0000 | ORAL_TABLET | ORAL | Status: DC | PRN

## 2012-12-11 NOTE — ED Provider Notes (Signed)
CSN: [id number]     Arrival date & time 12/11/12  1478 History     First MD Initiated Contact with Patient 12/11/12 0901     Chief Complaint  Patient presents with  . Knee Pain   (Consider location/radiation/quality/duration/timing/severity/associated sxs/prior Treatment) HPI Comments: Patient presents to urgent care describing that about 2 days ago she started with pain all over her knee just like a arthritis flareup but most recently as in yesterday the pain has moved to the back of her knee. It hurts when she flexes and extends her left knee. She denies any known trauma or injury. Denies any swelling to her lower extremity, calf pain, numbness tingling or weakness of her left lower extremity. Denies any changes to her skin such as redness or swelling. It does hurt when I walk on it.  Patient denies any swelling of her lower extremity, recent prolonged travels or immobilization, denies having a history of a DVT or PE. Is on high-dose aspirin as she refuses take Coumadin or other anticoagulants after aortic valve replacement.  Patient is a 79 y.o. female presenting with knee pain. The history is provided by the patient.  Knee Pain Location:  Knee Time since incident:  2 days Pain details:    Quality:  Aching   Radiates to:  Does not radiate   Severity:  Mild Chronicity:  New Foreign body present:  No foreign bodies Worsened by:  Activity, bearing weight, flexion and extension Ineffective treatments:  None tried Associated symptoms: stiffness   Associated symptoms: no decreased ROM, no fatigue, no muscle weakness, no swelling and no tingling     Past Medical History  Diagnosis Date  . Thyroid disease   . Diabetes mellitus   . Lumbago   . Aortic valve disorders     S/P AVR 06/2012  . Hyperlipidemia   . Heart murmur   . Hypertension   . Hypothyroidism   . Depression   . Shortness of breath   . Asthma     AS CHILD   . Arthritis    Past Surgical History  Procedure  Laterality Date  . Tubal ligation    . Breast surgery      LT BREAST BX BENIGN  . Aortic valve replacement N/A 06/25/2012    Procedure: AORTIC VALVE REPLACEMENT (AVR);  Surgeon: [name], MD;  Location: [facility] OR;  Service: Open Heart Surgery;  Laterality: N/A;  . Coronary artery bypass graft N/A 06/25/2012    Procedure: Coronary artery bypass graft  times two using left internal mammary artery and right leg greater saphenous vein;  Surgeon: [name], MD;  Location: [facility] OR;  Service: Open Heart Surgery;  Laterality: N/A;  . Intraoperative transesophageal echocardiogram N/A 06/25/2012    Procedure: INTRAOPERATIVE TRANSESOPHAGEAL ECHOCARDIOGRAM;  Surgeon: [name], MD;  Location: [facility] OR;  Service: Open Heart Surgery;  Laterality: N/A;   Family History  Problem Relation Age of Onset  . Heart disease Father    History  Substance Use Topics  . Smoking status: Never Smoker   . Smokeless tobacco: Not on file  . Alcohol Use: No   OB History   Grav Para Term Preterm Abortions TAB SAB Ect Mult Living                 Review of Systems  Constitutional: Negative for activity change, appetite change and fatigue.  Respiratory: Negative for shortness of breath.   Cardiovascular: Negative for chest pain, palpitations and leg swelling.  Musculoskeletal: Positive for stiffness.  Skin: Negative for pallor and rash.  Neurological: Negative for dizziness, light-headedness and headaches.    Allergies  Review of patient's allergies indicates no known allergies.  Home Medications   Current Outpatient Rx  Name  Route  Sig  Dispense  Refill  . aspirin EC 325 MG EC tablet   Oral   Take 1 tablet (325 mg total) by mouth daily.   30 tablet      . benazepril (LOTENSIN) 20 MG tablet   Oral   Take 1 tablet (20 mg total) by mouth daily.   30 tablet      . calcium carbonate (TUMS) 500 MG chewable tablet   Oral   Chew 1 tablet by mouth daily as needed for heartburn  (indigestion).         . carvedilol (COREG) 12.5 MG tablet   Oral   Take 1 tablet (12.5 mg total) by mouth 2 (two) times daily.   60 tablet   6     reduced   . Coenzyme Q10 (CO Q-10) 100 MG CAPS   Oral   Take 1 capsule by mouth 2 (two) times daily.         . felodipine (PLENDIL) 10 MG 24 hr tablet   Oral   Take 1 tablet (10 mg total) by mouth daily.   90 tablet   3   . hydrochlorothiazide (MICROZIDE) 12.5 MG capsule   Oral   Take 12.5 mg by mouth daily.         . insulin glargine (LANTUS) 100 UNIT/ML injection   Subcutaneous   Inject 18 Units into the skin every morning. Takes Lantus at night         . insulin lispro (HUMALOG) 100 UNIT/ML injection   Subcutaneous   Inject 4-6 Units into the skin 3 (three) times daily. Pt takes 6 units with each meal, can take as low 4 units in the evening it just depends on what she's eating         . levothyroxine (SYNTHROID, LEVOTHROID) 112 MCG tablet   Oral   Take 1 tablet (112 mcg total) by mouth daily.   30 tablet   5   . metFORMIN (GLUCOPHAGE-XR) 750 MG 24 hr tablet   Oral   Take 1 tablet (750 mg total) by mouth 2 (two) times daily.   120 tablet   5   . Multiple Vitamins-Minerals (MULTIVITAMINS THER. W/MINERALS) TABS   Oral   Take 1 tablet by mouth daily.         . rosuvastatin (CRESTOR) 20 MG tablet   Oral   Take 20 mg by mouth every evening.          Marland Kitchen HYDROcodone-acetaminophen (NORCO/VICODIN) 5-325 MG per tablet   Oral   Take 2 tablets by mouth every 4 (four) hours as needed for pain.   10 tablet   0    BP 110/46  Pulse 60  Temp(Src) 98.2 F (36.8 C) (Oral)  Resp 18  SpO2 100% Physical Exam  Nursing note and vitals reviewed. Constitutional: Vital signs are normal. She appears well-developed and well-nourished.  Non-toxic appearance. She does not have a sickly appearance. She does not appear ill. No distress.  Musculoskeletal: She exhibits tenderness.       Left lower leg: She exhibits  tenderness. She exhibits no bony tenderness, no swelling, no edema, no deformity and no laceration.       Legs: Neurological: She is alert.  Skin: No rash noted. No erythema.    ED Course   Procedures (including critical care time)  Labs Reviewed - No data to display Dg Knee Complete 4 Views Left  12/11/2012   *RADIOLOGY REPORT*  Clinical Data: Posterior pain after exercising.  LEFT KNEE - COMPLETE 4+ VIEW  Comparison: Femur films 01/09/2011  Findings: Moderate medial compartment and mild to moderate patellofemoral compartment joint space narrowing and osteophyte formation. No acute fracture or dislocation.  No definite joint effusion.  Vascular calcifications.  IMPRESSION: Degenerative change, without acute osseous finding.   Original Report Authenticated By: Jeronimo Greaves, M.D.   1. Osteoarthritis   2. Knee pain, acute, left     MDM  X-rays and physical exam was most consistent with moderate to severe or arthritis of the knee.  Plan of care:  Patient will be put in a knee sleeve for comfort Patient has been prescribed a narcotic pain medicine to use as needed Patient has been instructed to followup with her primary care Dr. or orthopedic provider pain was to persist beyond 7-10 days.  Have also discuss specific symptoms such as increased swelling, pain of a DVT the patient had mild concerns about her. We discuss that this is very unlikely in light of her alternative diagnosis and physical exam.  Discharge Medication List as of 12/11/2012 10:19 AM    START taking these medications   Details  HYDROcodone-acetaminophen (NORCO/VICODIN) 5-325 MG per tablet Take 2 tablets by mouth every 4 (four) hours as needed for pain., Starting 12/11/2012, Until Discontinued, Print        Jimmie Molly, MD 12/11/12 [phone]

## 2012-12-11 NOTE — ED Notes (Signed)
C/o left behind the knee pain for a couple of days now.  Denies injury.   No treatment done.

## 2012-12-13 ENCOUNTER — Encounter (HOSPITAL_COMMUNITY): Payer: Self-pay

## 2012-12-16 ENCOUNTER — Encounter (HOSPITAL_COMMUNITY): Payer: Self-pay

## 2012-12-18 ENCOUNTER — Encounter (HOSPITAL_COMMUNITY): Payer: Self-pay

## 2012-12-20 ENCOUNTER — Encounter (HOSPITAL_COMMUNITY): Payer: Self-pay

## 2012-12-23 ENCOUNTER — Encounter (HOSPITAL_COMMUNITY): Payer: Self-pay

## 2012-12-25 ENCOUNTER — Encounter (HOSPITAL_COMMUNITY): Payer: Self-pay

## 2012-12-27 ENCOUNTER — Encounter (HOSPITAL_COMMUNITY): Payer: Self-pay

## 2013-01-01 ENCOUNTER — Encounter (HOSPITAL_COMMUNITY): Payer: Medicare Other

## 2013-01-03 ENCOUNTER — Encounter (HOSPITAL_COMMUNITY): Payer: Medicare Other

## 2013-01-06 ENCOUNTER — Encounter (HOSPITAL_COMMUNITY): Payer: Medicare Other

## 2013-01-07 ENCOUNTER — Encounter: Payer: Self-pay | Admitting: Endocrinology

## 2013-01-07 ENCOUNTER — Other Ambulatory Visit: Payer: Self-pay | Admitting: *Deleted

## 2013-01-07 ENCOUNTER — Ambulatory Visit (INDEPENDENT_AMBULATORY_CARE_PROVIDER_SITE_OTHER): Payer: Medicare Other | Admitting: Endocrinology

## 2013-01-07 VITALS — BP 128/50 | HR 66 | Temp 98.3°F | Resp 12 | Ht 63.0 in | Wt 177.6 lb

## 2013-01-07 DIAGNOSIS — E039 Hypothyroidism, unspecified: Secondary | ICD-10-CM

## 2013-01-07 MED ORDER — INSULIN LISPRO 100 UNIT/ML (KWIKPEN): 4.0000 [IU] | PEN_INJECTOR | Freq: Two times a day (BID) | SUBCUTANEOUS | Status: DC

## 2013-01-07 NOTE — Patient Instructions (Addendum)
Stop Lantus and start HUmulin N, 14 units in am and 6 at bedtime  Humalog same as before. MUST TAKE HUMALOG BEFORE EATING LUNCH

## 2013-01-07 NOTE — Progress Notes (Signed)
[name] is an 79 y.o. female.   Reason for Appointment: Diabetes follow-up   History of Present Illness   Diagnosis: Type 2 DIABETES MELITUS, date of diagnosis:   2000   She has been on insulin since 2004 with difficulty controlling adequately because of compliance with diet and variability in blood sugars. Her previous records are not available at present, however she has been on basal insulin with mealtime coverage since at least 2007 She has been tried on [place] previously but did not tolerate this well  RECENT history: Her blood sugars appear to be consistently high after breakfast into the afternoon and are the highest before supper This is because she is often eating out at lunch time and will not take her insulin before the meal. This has been discussed several times before Also she thinks she is sometimes snacking frequently throughout the day and will occasionally have high readings late morning also She is also not checking blood sugars as frequently as before except in the morning        Oral hypoglycemic drugs: Metformin        Side effects from medications: None Insulin regimen:  Humalog 6-7, acb acl may take insulin 15+ min pc, taking minimal insulin at suppertime scissors a small meal. Lantus 18 units daily          Proper timing of medications in relation to meals:  no .         Monitors blood glucose: Once a day.    Glucometer: One Touch.          Blood Glucose readings from meter download: readings before breakfast:Median 133 with highest 153 and lowest 112 Midmorning median 182, midday median 219 and median at about 6 PM 262, highest 310 before supper After supper 83-248 with only occasional readings   Hypoglycemia frequency:  none recently .          Meals: 3 meals per day. her supper  is light;   lunch 2 pm   Physical activity: exercise: walks 30 min  on most days             The last HbgA1c was reported as ?  Lab Results  Component Value Date   HGBA1C  7.7* 06/21/2012   No results found for this basename: MICROALBUR, [id number]     Wt Readings from Last 3 Encounters:  01/07/13 177 lb 9.6 oz (80.559 kg)  09/12/12 178 lb 12.8 oz (81.103 kg)  08/22/12 179 lb 3.7 oz (81.3 kg)      Medication List       This list is accurate as of: 01/07/13  8:55 AM.  Always use your most recent med list.               aspirin 325 MG EC tablet  Take 1 tablet (325 mg total) by mouth daily.     benazepril 20 MG tablet  Commonly known as:  LOTENSIN  Take 1 tablet (20 mg total) by mouth daily.     carvedilol 12.5 MG tablet  Commonly known as:  COREG  Take 1 tablet (12.5 mg total) by mouth 2 (two) times daily.     cholecalciferol 1000 UNITS tablet  Commonly known as:  VITAMIN D  Take 1,000 Units by mouth daily.     Co Q-10 100 MG Caps  Take 1 capsule by mouth 2 (two) times daily.     felodipine 10 MG 24 hr tablet  Commonly known as:  PLENDIL  Take 1 tablet (10 mg total) by mouth daily.     furosemide 40 MG tablet  Commonly known as:  LASIX  12.5 mg.     hydrochlorothiazide 12.5 MG capsule  Commonly known as:  MICROZIDE  Take 12.5 mg by mouth daily.     HYDROcodone-acetaminophen 5-325 MG per tablet  Commonly known as:  NORCO/VICODIN  Take 2 tablets by mouth every 4 (four) hours as needed for pain.     insulin glargine 100 UNIT/ML injection  Commonly known as:  LANTUS  Inject 18 Units into the skin every morning. Takes Lantus at night     insulin lispro 100 UNIT/ML injection  Commonly known as:  HUMALOG  Inject 4-6 Units into the skin 3 (three) times daily. Pt takes 6 units with each meal, can take as low 4 units in the evening it just depends on what she's eating     levothyroxine 112 MCG tablet  Commonly known as:  SYNTHROID, LEVOTHROID  Take 1 tablet (112 mcg total) by mouth daily.     metFORMIN 750 MG 24 hr tablet  Commonly known as:  GLUCOPHAGE-XR  Take 1 tablet (750 mg total) by mouth 2 (two) times daily.      multivitamins ther. w/minerals Tabs tablet  Take 1 tablet by mouth daily.     rosuvastatin 20 MG tablet  Commonly known as:  CRESTOR  Take 20 mg by mouth every evening.     TUMS 500 MG chewable tablet  Generic drug:  calcium carbonate  Chew 1 tablet by mouth daily as needed for heartburn (indigestion).        Allergies: No Known Allergies  Past Medical History  Diagnosis Date  . Thyroid disease   . Diabetes mellitus   . Lumbago   . Aortic valve disorders     S/P AVR 06/2012  . Hyperlipidemia   . Heart murmur   . Hypertension   . Hypothyroidism   . Depression   . Shortness of breath   . Asthma     AS CHILD   . Arthritis     Past Surgical History  Procedure Laterality Date  . Tubal ligation    . Breast surgery      LT BREAST BX BENIGN  . Aortic valve replacement N/A 06/25/2012    Procedure: AORTIC VALVE REPLACEMENT (AVR);  Surgeon: [name], MD;  Location: [facility] OR;  Service: Open Heart Surgery;  Laterality: N/A;  . Coronary artery bypass graft N/A 06/25/2012    Procedure: Coronary artery bypass graft  times two using left internal mammary artery and right leg greater saphenous vein;  Surgeon: [name], MD;  Location: [facility] OR;  Service: Open Heart Surgery;  Laterality: N/A;  . Intraoperative transesophageal echocardiogram N/A 06/25/2012    Procedure: INTRAOPERATIVE TRANSESOPHAGEAL ECHOCARDIOGRAM;  Surgeon: [name], MD;  Location: [facility] OR;  Service: Open Heart Surgery;  Laterality: N/A;    Family History  Problem Relation Age of Onset  . Heart disease Father     Social History:  reports that she has never smoked. She does not have any smokeless tobacco history on file. She reports that she does not drink alcohol or use illicit drugs.  Review of Systems:  HYPERTENSION: taking multiple drugs but recently better controlled  HYPOTHYROIDISM: She has had long-standing hypothyroidism and recent labs not available, checked by PCP   HYPERLIPIDEMIA: The  lipid abnormality consists of elevated LDL Generally well controlled with Crestor.  Examination:   BP 128/50  Pulse 66  Temp(Src) 98.3 F (36.8 C)  Resp 12  Ht 5\' 3"  (1.6 m)  Wt 177 lb 9.6 oz (80.559 kg)  BMI 31.47 kg/m2  SpO2 99%  Body mass index is 31.47 kg/(m^2).  No ankle edema, feet are normal to inspection  ASSESSMENT/ PLAN::   Diabetes type 2   The patient's diabetes control appears to be inadequate with significantly high readings during the day and averaging at least 180 except on waking up and possibly after supper She is also not doing much monitoring after supper Again her main compliance is following diet consistently with snacking during the day and eating out frequently She will not take her insulin before eating and this is partly the reason for high readings in the afternoon She is also a candidate for Victoza, not clear if she tried this before  Discussed day-to-day management the patient and since she is tending to snack and gauze high readings throughout the middle of the day will give her NPH insulin instead of Lantus and also a small dose at bedtime to cover overnight hypoglycemia Discussed how NPH works, timing, dosage adjustment She will continue to need mealtime coverage. Emphasized the need for taking Humalog before eating lunch whenever going out She will also check more readings after supper and call if blood sugars are not consistently controlled Labs will be requested from Dr. Diamantina [place] office  Hypothyroidism: To have labs faxed from PCP office  Counseling time over 50% of today's 25 minute visit  Malya Cirillo 01/07/2013, 8:55 AM   Addendum: TSH 0.08, needs to reduce Synthroid 100, A1c 7.2

## 2013-01-08 ENCOUNTER — Encounter (HOSPITAL_COMMUNITY): Payer: Medicare Other

## 2013-01-09 ENCOUNTER — Telehealth: Payer: Self-pay | Admitting: Endocrinology

## 2013-01-09 MED ORDER — INSULIN NPH (HUMAN) (ISOPHANE) 100 UNIT/ML ~~LOC~~ SUSP: 20.0000 [IU] | Freq: Two times a day (BID) | SUBCUTANEOUS | Status: DC

## 2013-01-09 NOTE — Telephone Encounter (Signed)
Left message at home machine: patient needs to know that her thyroid level last month was high and if she had been on 112 mcg Synthroid at that time we will need to reduce it to 100 mcg, new prescription will be needed Diabetes control was fair with A1c 7.2

## 2013-01-10 ENCOUNTER — Encounter (HOSPITAL_COMMUNITY): Payer: Medicare Other

## 2013-01-10 NOTE — Telephone Encounter (Signed)
Patient notified of results. Patient stated that she was taking synthroid mcg when she had her labs done. Sent rx into pharmacy for synthroid 100 mcg.

## 2013-01-13 ENCOUNTER — Telehealth: Payer: Self-pay | Admitting: Endocrinology

## 2013-01-13 ENCOUNTER — Encounter (HOSPITAL_COMMUNITY): Payer: Medicare Other

## 2013-01-13 MED ORDER — INSULIN LISPRO 100 UNIT/ML (KWIKPEN): 4.0000 [IU] | PEN_INJECTOR | Freq: Two times a day (BID) | SUBCUTANEOUS | Status: DC

## 2013-01-13 MED ORDER — LEVOTHYROXINE SODIUM 100 MCG PO TABS: 100.0000 ug | ORAL_TABLET | Freq: Every day | ORAL | Status: DC

## 2013-01-13 NOTE — Telephone Encounter (Signed)
Please call about med question / Sherri

## 2013-01-14 ENCOUNTER — Telehealth: Payer: Self-pay | Admitting: Endocrinology

## 2013-01-14 NOTE — Telephone Encounter (Signed)
Spoke with patient regarding medications.

## 2013-01-15 ENCOUNTER — Encounter (HOSPITAL_COMMUNITY): Payer: Medicare Other

## 2013-01-17 ENCOUNTER — Encounter (HOSPITAL_COMMUNITY): Payer: Medicare Other

## 2013-01-20 ENCOUNTER — Encounter (HOSPITAL_COMMUNITY): Payer: Medicare Other

## 2013-01-22 ENCOUNTER — Encounter (HOSPITAL_COMMUNITY): Payer: Medicare Other

## 2013-01-24 ENCOUNTER — Encounter (HOSPITAL_COMMUNITY): Payer: Medicare Other

## 2013-01-27 ENCOUNTER — Encounter (HOSPITAL_COMMUNITY): Payer: Medicare Other

## 2013-01-29 ENCOUNTER — Encounter (HOSPITAL_COMMUNITY): Payer: Medicare Other

## 2013-01-30 ENCOUNTER — Other Ambulatory Visit (INDEPENDENT_AMBULATORY_CARE_PROVIDER_SITE_OTHER): Payer: Medicare Other

## 2013-01-30 DIAGNOSIS — E039 Hypothyroidism, unspecified: Secondary | ICD-10-CM

## 2013-01-31 ENCOUNTER — Encounter (HOSPITAL_COMMUNITY): Payer: Medicare Other

## 2013-01-31 LAB — BASIC METABOLIC PANEL
BUN: 14 mg/dL (ref 6–23)
Calcium: 9.7 mg/dL (ref 8.4–10.5)
GFR: 83.22 mL/min (ref >60.00)
Glucose, Bld: 74 mg/dL (ref 70–99)
Sodium: 135 mEq/L (ref 135–145)

## 2013-02-03 ENCOUNTER — Encounter (HOSPITAL_COMMUNITY): Payer: Medicare Other

## 2013-02-04 ENCOUNTER — Ambulatory Visit (INDEPENDENT_AMBULATORY_CARE_PROVIDER_SITE_OTHER): Payer: Medicare Other | Admitting: Endocrinology

## 2013-02-04 ENCOUNTER — Encounter: Payer: Self-pay | Admitting: Endocrinology

## 2013-02-04 VITALS — BP 148/80 | HR 56 | Temp 98.4°F | Resp 12 | Ht 64.0 in | Wt 180.6 lb

## 2013-02-04 DIAGNOSIS — E039 Hypothyroidism, unspecified: Secondary | ICD-10-CM

## 2013-02-04 NOTE — Patient Instructions (Signed)
Start VICTOZA injection with the sample pen once daily at the same time of the day.  Dial the dose to 0.6 mg for the first week.  You may  experience nausea in the first few days which usually gets better the After 1 week increase the dose to 1.2mg  daily if no nausea.  You may inject in the stomach, thigh or arm.   You will feel fullness of the stomach with starting the medication and should try to keep portions of food small.    No Humalog at dinner if eating very little carbs

## 2013-02-04 NOTE — Progress Notes (Signed)
[name] is an 79 y.o. female.   Reason for Appointment: Diabetes follow-up   History of Present Illness   Diagnosis: Type 2 DIABETES MELITUS, date of diagnosis:   2000   She has been on insulin since 2004 with difficulty controlling adequately because of compliance with diet and variability in blood sugars. Her previous records are not available at present, however she has been on basal insulin with mealtime coverage since at least 2007 She has been tried on [place] previously but did not tolerate this well  RECENT history: Blood sugars are not improved and she did not start the NPH insulin that was prescribed on her last visit because of cost of multiple medications Her blood sugars  are againmostly high after breakfast and frequently midday and afternoon . Median reading is over 200 between late morning and early evening This is because she is often having snacks in between meals and probably having high carbohydrate foods at breakfast sometimes. Also may be eating out at lunchtime frequently. She thinks she is trying to be better with her insulin compliance at mealtimes She has not been able to lose weight either She did have an episode of hypoglycemia in the evening when she had very little food and carbohydrate and took 4 units  Oral hypoglycemic drugs: Metformin        Side effects from medications: None Insulin regimen:  Humalog 6-7, acb acl , taking minimal 4-6  nsulin at suppertime a small meal. Lantus 18 units daily          Proper timing of medications in relation to meals:  no .         Monitors blood glucose: Once a day.    Glucometer: One Touch.          Blood Glucose readings from meter download: readings before breakfast: Recently 101-198, overnight 79, 137 PC breakfast not checked recently but highest 328. Midday 114-349, afternoon median 216 and before supper 168-295 After supper 50-240 with only occasional readings Overall median 171   Hypoglycemia frequency:   once after supper recently .          Meals: 3 meals per day. her supper  is light;  lunch 12-2 pm   Physical activity: exercise: walks 30 min off and on            The last HbgA1c was reported as ? 7.3    Lab Results  Component Value Date   HGBA1C 7.8* 01/30/2013   No results found for this basename: [name]   Appointment on 01/30/2013  Component Date Value Range Status  . Hemoglobin A1C 01/30/2013 7.8* 4.6 - 6.5 % Final   Glycemic Control Guidelines for People with Diabetes:Non Diabetic:  <6%Goal of Therapy: <7%Additional Action Suggested:  >8%   . Sodium 01/30/2013 135  135 - 145 mEq/L Final  . Potassium 01/30/2013 4.3  3.5 - 5.1 mEq/L Final  . Chloride 01/30/2013 100  96 - 112 mEq/L Final  . CO2 01/30/2013 28  19 - 32 mEq/L Final  . Glucose, Bld 01/30/2013 74  70 - 99 mg/dL Final  . BUN 16/01/9603 14  6 - 23 mg/dL Final  . Creatinine, Ser 01/30/2013 0.9  0.4 - 1.2 mg/dL Final  . Calcium 54/12/8117 9.7  8.4 - 10.5 mg/dL Final  . GFR 14/78/2956 83.22  >60.00 mL/min Final  . TSH 01/30/2013 0.13* 0.35 - 5.50 uIU/mL Final  . Free T4 01/30/2013 1.32  0.60 - 1.60 ng/dL  Final     Wt Readings from Last 3 Encounters:  02/04/13 180 lb 9.6 oz (81.92 kg)  01/07/13 177 lb 9.6 oz (80.559 kg)  09/12/12 178 lb 12.8 oz (81.103 kg)      Medication List       This list is accurate as of: 02/04/13 11:59 PM.  Always use your most recent med list.               aspirin 325 MG EC tablet  Take 1 tablet (325 mg total) by mouth daily.     benazepril 20 MG tablet  Commonly known as:  LOTENSIN  Take 1 tablet (20 mg total) by mouth daily.     carvedilol 12.5 MG tablet  Commonly known as:  COREG  Take 1 tablet (12.5 mg total) by mouth 2 (two) times daily.     cholecalciferol 1000 UNITS tablet  Commonly known as:  VITAMIN D  Take 1,000 Units by mouth daily.     Co Q-10 100 MG Caps  Take 1 capsule by mouth 2 (two) times daily.     felodipine 10 MG 24 hr tablet   Commonly known as:  PLENDIL  Take 1 tablet (10 mg total) by mouth daily.     furosemide 40 MG tablet  Commonly known as:  LASIX  12.5 mg.     hydrochlorothiazide 12.5 MG capsule  Commonly known as:  MICROZIDE  Take 12.5 mg by mouth daily.     HYDROcodone-acetaminophen 5-325 MG per tablet  Commonly known as:  NORCO/VICODIN  Take 2 tablets by mouth every 4 (four) hours as needed for pain.     insulin glargine 100 UNIT/ML injection  Commonly known as:  LANTUS  Inject 18 Units into the skin every morning. Takes Lantus at night     insulin lispro 100 UNIT/ML Sopn  Commonly known as:  HUMALOG KWIKPEN  Inject 4-6 Units into the skin 2 (two) times daily.     insulin NPH 100 UNIT/ML injection  Commonly known as:  HUMULIN N  Inject 20 Units into the skin 2 (two) times daily at 8 am and 10 pm.     levothyroxine 100 MCG tablet  Commonly known as:  SYNTHROID, LEVOTHROID  Take 1 tablet (100 mcg total) by mouth daily.     metFORMIN 750 MG 24 hr tablet  Commonly known as:  GLUCOPHAGE-XR  Take 1 tablet (750 mg total) by mouth 2 (two) times daily.     multivitamins ther. w/minerals Tabs tablet  Take 1 tablet by mouth daily.     rosuvastatin 20 MG tablet  Commonly known as:  CRESTOR  Take 20 mg by mouth every evening.     TUMS 500 MG chewable tablet  Generic drug:  calcium carbonate  Chew 1 tablet by mouth daily as needed for heartburn (indigestion).        Allergies: No Known Allergies  Past Medical History  Diagnosis Date  . Thyroid disease   . Diabetes mellitus   . Lumbago   . Aortic valve disorders     S/P AVR 06/2012  . Hyperlipidemia   . Heart murmur   . Hypertension   . Hypothyroidism   . Depression   . Shortness of breath   . Asthma     AS CHILD   . Arthritis     Past Surgical History  Procedure Laterality Date  . Tubal ligation    . Breast surgery      LT BREAST BX  BENIGN  . Aortic valve replacement N/A 06/25/2012    Procedure: AORTIC VALVE REPLACEMENT  (AVR);  Surgeon: [name], MD;  Location: [facility] OR;  Service: Open Heart Surgery;  Laterality: N/A;  . Coronary artery bypass graft N/A 06/25/2012    Procedure: Coronary artery bypass graft  times two using left internal mammary artery and right leg greater saphenous vein;  Surgeon: [name], MD;  Location: [facility] OR;  Service: Open Heart Surgery;  Laterality: N/A;  . Intraoperative transesophageal echocardiogram N/A 06/25/2012    Procedure: INTRAOPERATIVE TRANSESOPHAGEAL ECHOCARDIOGRAM;  Surgeon: [name], MD;  Location: [facility] OR;  Service: Open Heart Surgery;  Laterality: N/A;    Family History  Problem Relation Age of Onset  . Heart disease Father     Social History:  reports that she has never smoked. She does not have any smokeless tobacco history on file. She reports that she does not drink alcohol or use illicit drugs.  Review of Systems:  HYPERTENSION: taking multiple drugs  and now fairly well  controlled  HYPOTHYROIDISM: She has had long-standing hypothyroidism and Because of her low TSH of 0.08 she was told to reduce her Synthroid to 100 mcg. She does not want to reduce the dose further because she thinks she gets tired with less medication  HYPERLIPIDEMIA: The lipid abnormality consists of elevated LDL Generally well controlled with Crestor.     Examination:   BP 148/80  Pulse 56  Temp(Src) 98.4 F (36.9 C)  Resp 12  Ht 5\' 4"  (1.626 m)  Wt 180 lb 9.6 oz (81.92 kg)  BMI 30.98 kg/m2  SpO2 97%  Body mass index is 30.98 kg/(m^2).   No ankle edema  ASSESSMENT/ PLAN::   Diabetes type 2, uncontrolled   The patient's diabetes control appears to be still poorly controlled especially postprandially during the day and not being able to get enough coverage for her meals and snacks. She probably would do better with adding NPH insulin but she is reluctant to do so because of the cost of multiple medications  Also has had difficulty losing weight and complains  of hunger during the day and getting carbohydrate snacks She has not tried Victoza and appears to be a good candidate for this with all the benefits of weight loss, postprandial control and improved insulin action Discussed how this works and benefits, side effects, safety, doses titration, injection technique and timing May also need reduction of insulin with starting Victoza.  She was encouraged to check more readings after breakfast which he is not doing to modify her diet accordingly Encouraged her to walk more regularly May not need insulin at suppertime if she is eating very little carbohydrate   HYPOTHYROIDISM: Her TSH is still relatively low. She is refusing to reduce the dose and since her dose was changed only about 3 weeks ago will continue the same dose for now  Hypertension: Fairly well controlled, tends to have some high readings in the office   Counseling time over 50% of today's 25 minute visit   Clotee Schlicker 02/06/2013, 9:37 AM

## 2013-02-05 ENCOUNTER — Encounter (HOSPITAL_COMMUNITY): Payer: Medicare Other

## 2013-02-06 MED ORDER — LIRAGLUTIDE 18 MG/3ML ~~LOC~~ SOPN: 1.2000 mg | PEN_INJECTOR | Freq: Every day | SUBCUTANEOUS | Status: DC

## 2013-02-07 ENCOUNTER — Encounter (HOSPITAL_COMMUNITY): Payer: Medicare Other

## 2013-02-07 ENCOUNTER — Ambulatory Visit: Payer: Self-pay | Admitting: Podiatrist

## 2013-02-10 ENCOUNTER — Encounter (HOSPITAL_COMMUNITY): Payer: Medicare Other

## 2013-02-12 ENCOUNTER — Encounter (HOSPITAL_COMMUNITY): Payer: Medicare Other

## 2013-02-14 ENCOUNTER — Encounter (HOSPITAL_COMMUNITY): Payer: Medicare Other

## 2013-02-17 ENCOUNTER — Encounter (HOSPITAL_COMMUNITY): Payer: Medicare Other

## 2013-02-19 ENCOUNTER — Encounter (HOSPITAL_COMMUNITY): Payer: Medicare Other

## 2013-02-21 ENCOUNTER — Ambulatory Visit (INDEPENDENT_AMBULATORY_CARE_PROVIDER_SITE_OTHER): Payer: Medicare Other | Admitting: Cardiovascular Disease

## 2013-02-21 ENCOUNTER — Encounter: Payer: Self-pay | Admitting: Cardiovascular Disease

## 2013-02-21 ENCOUNTER — Encounter (HOSPITAL_COMMUNITY): Payer: Medicare Other

## 2013-02-21 VITALS — BP 142/72 | HR 60 | Ht 64.0 in | Wt 179.0 lb

## 2013-02-21 DIAGNOSIS — I359 Nonrheumatic aortic valve disorder, unspecified: Secondary | ICD-10-CM

## 2013-02-21 DIAGNOSIS — E785 Hyperlipidemia, unspecified: Secondary | ICD-10-CM

## 2013-02-21 DIAGNOSIS — I350 Nonrheumatic aortic (valve) stenosis: Secondary | ICD-10-CM

## 2013-02-21 DIAGNOSIS — Z951 Presence of aortocoronary bypass graft: Secondary | ICD-10-CM

## 2013-02-21 DIAGNOSIS — I2510 Atherosclerotic heart disease of native coronary artery without angina pectoris: Secondary | ICD-10-CM

## 2013-02-21 LAB — BASIC METABOLIC PANEL
BUN: 16 mg/dL (ref 6–23)
Calcium: 10.1 mg/dL (ref 8.4–10.5)
Creatinine, Ser: 0.8 mg/dL (ref 0.4–1.2)
GFR: 93.26 mL/min (ref >60.00)
Glucose, Bld: 213 mg/dL — ABNORMAL HIGH (ref 70–99)

## 2013-02-21 LAB — HEPATIC FUNCTION PANEL
ALT: 13 U/L (ref 0–35)
AST: 20 U/L (ref 0–37)
Alkaline Phosphatase: 50 U/L (ref 39–117)
Bilirubin, Direct: 0 mg/dL (ref 0.0–0.3)
Total Bilirubin: 0.3 mg/dL (ref 0.3–1.2)

## 2013-02-21 NOTE — Assessment & Plan Note (Signed)
S/p AVR

## 2013-02-21 NOTE — Patient Instructions (Signed)
Your physician recommends that you return for lab work in: bmet lipid liver  Your physician wants you to follow-up in: 6 months You will receive a reminder letter in the mail two months in advance. If you don't receive a letter, please call our office to schedule the follow-up appointment.

## 2013-02-21 NOTE — Assessment & Plan Note (Signed)
Ms. Trampe is doing OK.  She is not exercising much - I have encouraged her to get out more.  She was at cardiac rehab but was told that she had to park in a certain lot - this upset her and she stopped going.    Will check lipid, liver, BMP today.   I'll see her again in 6 months for followup office visit and repeat labs. I tried to encourage her to exercise on a regular basis.

## 2013-02-21 NOTE — Progress Notes (Signed)
[name] Date of Birth: Jul 01, 1932 Medical Record #[id number]  Problem List 1. CAD - s/p CABG 2-27 14  2. Aortic stenosis - s/p 21 mm pericardial aortic valve 3. Post op atrial fibrillation 4. HTN   History of Present Illness: [name] is seen back today for a 2 week check.  She has had recent AVR and CABG x 2 per Dr. Tyrone Sage. This was with a LIMA to the LAD and SVG to the LCX,  with a pericardial Edwards tissue valve #21 mm. Did have post op atrial fib - on amiodarone. Other issues include past AS, DM, HLD, HTN and hypothryoidism.   I saw her 2 weeks ago. I gave her a short course of Lasix for some volume overload. We also stopped her amiodarone due to prolonged QT.   She comes back today. She is here alone. Weight is down 7 pounds. She is doing well. Feeling better and stronger. Still with some pedal edema - says she did not have this prior to her surgery. Still taking 40 mg of Lasix.  Walking in her house. Little scared about venturing outside. Ready to go to cardiac rehab. Not dizzy or lightheaded. No shortness of breath.   Sep 12, 2012:  [name] is doing well after her CABG and AVR.   She has a swollen area at one of the SVG harvest sites.  No redness. Mildly tender.    She has had some post op anemia.  Her potassium has been low.    Oct. 24, 2014:  [name] is doing well.  She is recovering well.  She needs to get out and exercise.    Current Outpatient Prescriptions on File Prior to Visit  Medication Sig Dispense Refill  . aspirin EC 325 MG EC tablet Take 1 tablet (325 mg total) by mouth daily.  30 tablet    . benazepril (LOTENSIN) 20 MG tablet Take 1 tablet (20 mg total) by mouth daily.  30 tablet    . calcium carbonate (TUMS) 500 MG chewable tablet Chew 1 tablet by mouth daily as needed for heartburn (indigestion).      . carvedilol (COREG) 12.5 MG tablet Take 1 tablet (12.5 mg total) by mouth 2 (two) times daily.  60 tablet  6  . cholecalciferol (VITAMIN D)  1000 UNITS tablet Take 1,000 Units by mouth daily.      . Coenzyme Q10 (CO Q-10) 100 MG CAPS Take 1 capsule by mouth 2 (two) times daily.      . felodipine (PLENDIL) 10 MG 24 hr tablet Take 1 tablet (10 mg total) by mouth daily.  90 tablet  3  . hydrochlorothiazide (MICROZIDE) 12.5 MG capsule Take 12.5 mg by mouth daily.      . insulin glargine (LANTUS) 100 UNIT/ML injection Inject 18 Units into the skin every morning. Takes Lantus at night      . insulin lispro (HUMALOG KWIKPEN) 100 UNIT/ML SOPN Inject 4-6 Units into the skin 2 (two) times daily.  5 pen  1  . insulin NPH (HUMULIN N) 100 UNIT/ML injection Inject 20 Units into the skin 2 (two) times daily at 8 am and 10 pm.  3 mL  1  . levothyroxine (SYNTHROID, LEVOTHROID) 100 MCG tablet Take 1 tablet (100 mcg total) by mouth daily.  30 tablet  5  . metFORMIN (GLUCOPHAGE-XR) 750 MG 24 hr tablet Take 1 tablet (750 mg total) by mouth 2 (two) times daily.  120 tablet  5  .  Multiple Vitamins-Minerals (MULTIVITAMINS THER. W/MINERALS) TABS Take 1 tablet by mouth daily.      . rosuvastatin (CRESTOR) 20 MG tablet Take 20 mg by mouth every evening.       . furosemide (LASIX) 40 MG tablet 12.5 mg.      . HYDROcodone-acetaminophen (NORCO/VICODIN) 5-325 MG per tablet Take 2 tablets by mouth every 4 (four) hours as needed for pain.  10 tablet  0  . Liraglutide (VICTOZA) 18 MG/3ML SOPN Inject 1.2 mg into the skin daily.  2 pen  3   No current facility-administered medications on file prior to visit.    No Known Allergies  Past Medical History  Diagnosis Date  . Thyroid disease   . Diabetes mellitus   . Lumbago   . Aortic valve disorders     S/P AVR 06/2012  . Hyperlipidemia   . Heart murmur   . Hypertension   . Hypothyroidism   . Depression   . Shortness of breath   . Asthma     AS CHILD   . Arthritis     Past Surgical History  Procedure Laterality Date  . Tubal ligation    . Breast surgery      LT BREAST BX BENIGN  . Aortic valve  replacement N/A 06/25/2012    Procedure: AORTIC VALVE REPLACEMENT (AVR);  Surgeon: [name], MD;  Location: [facility] OR;  Service: Open Heart Surgery;  Laterality: N/A;  . Coronary artery bypass graft N/A 06/25/2012    Procedure: Coronary artery bypass graft  times two using left internal mammary artery and right leg greater saphenous vein;  Surgeon: [name], MD;  Location: [facility] OR;  Service: Open Heart Surgery;  Laterality: N/A;  . Intraoperative transesophageal echocardiogram N/A 06/25/2012    Procedure: INTRAOPERATIVE TRANSESOPHAGEAL ECHOCARDIOGRAM;  Surgeon: [name], MD;  Location: [facility] OR;  Service: Open Heart Surgery;  Laterality: N/A;    History  Smoking status  . Never Smoker   Smokeless tobacco  . Not on file    History  Alcohol Use No    Family History  Problem Relation Age of Onset  . Heart disease Father     Review of Systems: The review of systems is per the HPI.  All other systems were reviewed and are negative.  Physical Exam: BP 142/72  Pulse 60  Ht 5\' 4"  (1.626 m)  Wt 179 lb (81.194 kg)  BMI 30.71 kg/m2 Patient is very pleasant and in no acute distress. Skin is warm and dry. Color is normal.  HEENT is unremarkable. Normocephalic/atraumatic. PERRL. Sclera are nonicteric. Neck is supple. No masses. No JVD. Lungs are clear. Cardiac exam shows a regular rate and rhythm.  Soft systolic murmur. Abdomen is soft. Extremities are without edema. Gait and ROM are intact. No gross neurologic deficits noted.  LABORATORY DATA: Pending   Lab Results  Component Value Date   WBC 5.4 09/12/2012   HGB 10.1* 09/12/2012   HCT 30.1* 09/12/2012   PLT 202.0 09/12/2012   GLUCOSE 74 01/30/2013   ALT 12 06/21/2012   AST 23 06/21/2012   NA 135 01/30/2013   K 4.3 01/30/2013   CL 100 01/30/2013   CREATININE 0.9 01/30/2013   BUN 14 01/30/2013   CO2 28 01/30/2013   TSH 0.13* 01/30/2013   INR 1.57* 06/25/2012   HGBA1C 7.8* 01/30/2013   Assessment / Plan:

## 2013-02-24 ENCOUNTER — Encounter (HOSPITAL_COMMUNITY): Payer: Medicare Other

## 2013-02-26 ENCOUNTER — Encounter (HOSPITAL_COMMUNITY): Payer: Medicare Other

## 2013-02-26 ENCOUNTER — Encounter: Payer: Self-pay | Admitting: Endocrinology

## 2013-02-28 ENCOUNTER — Encounter (HOSPITAL_COMMUNITY): Payer: Medicare Other

## 2013-03-03 ENCOUNTER — Encounter (HOSPITAL_COMMUNITY): Payer: Medicare Other

## 2013-03-04 NOTE — Progress Notes (Signed)
Yesterday, I reviewed the psychosocial assessment done by Girard Cooter on 06/27/2012. I agree with the content.  Gretta Cool, LCSW Assistant Director Clinical Social Work

## 2013-03-05 ENCOUNTER — Encounter (HOSPITAL_COMMUNITY): Payer: Medicare Other

## 2013-03-05 ENCOUNTER — Encounter: Payer: Self-pay | Admitting: Podiatrist

## 2013-03-05 ENCOUNTER — Ambulatory Visit (INDEPENDENT_AMBULATORY_CARE_PROVIDER_SITE_OTHER): Payer: Medicare Other | Admitting: Podiatrist

## 2013-03-05 VITALS — Ht 63.0 in | Wt 187.0 lb

## 2013-03-05 DIAGNOSIS — B351 Tinea unguium: Secondary | ICD-10-CM

## 2013-03-05 DIAGNOSIS — M79609 Pain in unspecified limb: Secondary | ICD-10-CM

## 2013-03-05 NOTE — Progress Notes (Signed)
HPI:  Patient presents today for follow up of foot and nail care. Denies any new complaints today.  Objective:  Patients chart is reviewed.  Neurovascular status unchanged.  Patients nails are thickened, discolored, distrophic, friable and brittle with yellow-brown discoloration. Patient subjectively relates they are painful with shoes and with ambulation.both feet 1-5  Assessment:  Symptomatic onychomycosis  Plan:  Discussed treatment options and alternatives.  The symptomatic toenails were debrided through manual an mechanical means without complication.  Return appointment recommended at routine intervals of 3 months   

## 2013-03-05 NOTE — Patient Instructions (Signed)
Diabetes and Foot Care Diabetes may cause you to have problems because of poor blood supply (circulation) to your feet and legs. This may cause the skin on your feet to become thinner, break easier, and heal more slowly. Your skin may become dry, and the skin may peel and crack. You may also have nerve damage in your legs and feet causing decreased feeling in them. You may not notice minor injuries to your feet that could lead to infections or more serious problems. Taking care of your feet is one of the most important things you can do for yourself.  HOME CARE INSTRUCTIONS  Wear shoes at all times, even in the house. Do not go barefoot. Bare feet are easily injured.  Check your feet daily for blisters, cuts, and redness. If you cannot see the bottom of your feet, use a mirror or ask someone for help.  Wash your feet with warm water (do not use hot water) and mild soap. Then pat your feet and the areas between your toes until they are completely dry. Do not soak your feet as this can dry your skin.  Apply a moisturizing lotion or petroleum jelly (that does not contain alcohol and is unscented) to the skin on your feet and to dry, brittle toenails. Do not apply lotion between your toes.  Trim your toenails straight across. Do not dig under them or around the cuticle. File the edges of your nails with an emery board or nail file.  Do not cut corns or calluses or try to remove them with medicine.  Wear clean socks or stockings every day. Make sure they are not too tight. Do not wear knee-high stockings since they may decrease blood flow to your legs.  Wear shoes that fit properly and have enough cushioning. To break in new shoes, wear them for just a few hours a day. This prevents you from injuring your feet. Always look in your shoes before you put them on to be sure there are no objects inside.  Do not cross your legs. This may decrease the blood flow to your feet.  If you find a minor scrape,  cut, or break in the skin on your feet, keep it and the skin around it clean and dry. These areas may be cleansed with mild soap and water. Do not cleanse the area with peroxide, alcohol, or iodine.  When you remove an adhesive bandage, be sure not to damage the skin around it.  If you have a wound, look at it several times a day to make sure it is healing.  Do not use heating pads or hot water bottles. They may burn your skin. If you have lost feeling in your feet or legs, you may not know it is happening until it is too late.  Make sure your health care provider performs a complete foot exam at least annually or more often if you have foot problems. Report any cuts, sores, or bruises to your health care provider immediately. SEEK MEDICAL CARE IF:   You have an injury that is not healing.  You have cuts or breaks in the skin.  You have an ingrown nail.  You notice redness on your legs or feet.  You feel burning or tingling in your legs or feet.  You have pain or cramps in your legs and feet.  Your legs or feet are numb.  Your feet always feel cold. SEEK IMMEDIATE MEDICAL CARE IF:   There is increasing redness,   swelling, or pain in or around a wound.  There is a red line that goes up your leg.  Pus is coming from a wound.  You develop a fever or as directed by your health care provider.  You notice a bad smell coming from an ulcer or wound. Document Released: 04/14/2000 Document Revised: 12/18/2012 Document Reviewed: 09/24/2012 [employer] Patient Information 2014 [employer].  

## 2013-03-07 ENCOUNTER — Encounter (HOSPITAL_COMMUNITY): Payer: Medicare Other

## 2013-03-10 ENCOUNTER — Encounter (HOSPITAL_COMMUNITY): Payer: Medicare Other

## 2013-03-10 ENCOUNTER — Other Ambulatory Visit (HOSPITAL_COMMUNITY): Payer: Self-pay | Admitting: Internal Medicine

## 2013-03-10 ENCOUNTER — Ambulatory Visit (HOSPITAL_COMMUNITY)
Admission: RE | Admit: 2013-03-10 | Discharge: 2013-03-10 | Disposition: A | Discharge status: Home / Self Care | Payer: Medicare Other | Source: Ambulatory Visit | Attending: Surgery | Admitting: Surgery

## 2013-03-10 DIAGNOSIS — M79609 Pain in unspecified limb: Secondary | ICD-10-CM

## 2013-03-12 ENCOUNTER — Other Ambulatory Visit (INDEPENDENT_AMBULATORY_CARE_PROVIDER_SITE_OTHER): Payer: Medicare Other

## 2013-03-12 ENCOUNTER — Encounter (HOSPITAL_COMMUNITY): Payer: Medicare Other

## 2013-03-12 DIAGNOSIS — E039 Hypothyroidism, unspecified: Secondary | ICD-10-CM

## 2013-03-12 LAB — BASIC METABOLIC PANEL
BUN: 19 mg/dL (ref 6–23)
CO2: 28 mEq/L (ref 19–32)
Calcium: 9.5 mg/dL (ref 8.4–10.5)
Creatinine, Ser: 0.9 mg/dL (ref 0.4–1.2)
GFR: 82.08 mL/min (ref >60.00)
Glucose, Bld: 182 mg/dL — ABNORMAL HIGH (ref 70–99)
Sodium: 133 mEq/L — ABNORMAL LOW (ref 135–145)

## 2013-03-12 LAB — TSH: TSH: 0.43 u[IU]/mL (ref 0.35–5.50)

## 2013-03-13 ENCOUNTER — Other Ambulatory Visit: Payer: Medicare Other

## 2013-03-14 ENCOUNTER — Encounter (HOSPITAL_COMMUNITY): Payer: Medicare Other

## 2013-03-17 ENCOUNTER — Encounter (HOSPITAL_COMMUNITY): Payer: Medicare Other

## 2013-03-18 ENCOUNTER — Ambulatory Visit (INDEPENDENT_AMBULATORY_CARE_PROVIDER_SITE_OTHER): Payer: Medicare Other | Admitting: Endocrinology

## 2013-03-18 ENCOUNTER — Encounter: Payer: Self-pay | Admitting: Endocrinology

## 2013-03-18 VITALS — BP 122/60 | HR 64 | Temp 98.7°F | Resp 12 | Ht 63.0 in | Wt 182.1 lb

## 2013-03-18 DIAGNOSIS — E785 Hyperlipidemia, unspecified: Secondary | ICD-10-CM

## 2013-03-18 DIAGNOSIS — I10 Essential (primary) hypertension: Secondary | ICD-10-CM

## 2013-03-18 DIAGNOSIS — M7122 Synovial cyst of popliteal space [Baker], left knee: Secondary | ICD-10-CM

## 2013-03-18 DIAGNOSIS — M7120 Synovial cyst of popliteal space [Baker], unspecified knee: Secondary | ICD-10-CM

## 2013-03-18 DIAGNOSIS — E039 Hypothyroidism, unspecified: Secondary | ICD-10-CM

## 2013-03-18 MED ORDER — ROSUVASTATIN CALCIUM 20 MG PO TABS: 20.0000 mg | ORAL_TABLET | Freq: Every evening | ORAL | Status: DC

## 2013-03-18 NOTE — Patient Instructions (Signed)
Lantus 20 units daily, HUMALOG 8 IN AM AND 10 AT LUNCH AND 4 AT SUPPER, ADD 2 UNITS IF SUGAR >200

## 2013-03-18 NOTE — Progress Notes (Signed)
[name] is an 79 y.o. female.   Reason for Appointment: Diabetes follow-up   History of Present Illness   Diagnosis: Type 2 DIABETES MELITUS, date of diagnosis:   2000   She has been on insulin since 2004 with difficulty controlling adequately because of compliance with diet and variability in blood sugars. Her previous records are not available at present, however she has been on basal insulin with mealtime coverage since at least 2007 She has been tried on Byetta previously but did not tolerate this well  RECENT history: She tried Victoza as discussed on her last visit but didn't not benefit from Victoza 0.6 dosage and had side effects of nausea from 1.2 mg; did not continue this because of nausea Her blood sugars  are again mostly high after breakfast and frequently midday and afternoon . Median reading is over 200 between late morning and early evening. She is better compliant with taking her lunchtime dose when eating out and she thinks her portions are controlled May be still having some snacks in between meals  She has had significant weight fluctuation recently She has had 2 more episodes of  hypoglycemia in the evening after supper, still taking 6 units even though she is supposed to take 4 units; sugars are generally higher before supper  Oral hypoglycemic drugs: Metformin        .  INSULIN: Lantus 18 units daily, HUMALOG 6  AC  3 times a day       Proper timing of medications in relation to meals: yes .         Monitors blood glucose: Once a day.    Glucometer: One Touch.          Blood Glucose readings from meter download: readings before breakfast: Averaging 142, range 105-163  Midmorning median 183, lunchtime and afternoon are the highest of the day, averaging about 220 and suppertime range 157-283 PC supper are 46-198 PC breakfast not checked recently but highest 328. Midday 114-349, afternoon median 216 and before supper 168-295   Hypoglycemia frequency:  2x  after supper recently .          Meals: 3 meals per day. her supper  is light;  lunch 12-2 pm   Physical activity: exercise: walks 30 min off and on            PROBLEM #2: She complains of her left leg hurting but mostly behind and in the upper calf and she has difficulty sitting in the car with this. No significant swelling in her leg and she had a venous Doppler studies which was negative from PCP, not on any treatment   Lab Results  Component Value Date   HGBA1C 7.8* 01/30/2013   No results found for this basename: [name]   Appointment on 03/12/2013  Component Date Value Range Status  . Free T4 03/12/2013 1.19  0.60 - 1.60 ng/dL Final  . TSH 16/01/9603 0.43  0.35 - 5.50 uIU/mL Final  . Fructosamine 03/12/2013 331* <285 umol/L Final   Comment:                            Variations in levels of serum proteins (albumin and immunoglobulins)                          may affect fructosamine results.                             Marland Kitchen  Sodium 03/12/2013 133* 135 - 145 mEq/L Final  . Potassium 03/12/2013 4.1  3.5 - 5.1 mEq/L Final  . Chloride 03/12/2013 98  96 - 112 mEq/L Final  . CO2 03/12/2013 28  19 - 32 mEq/L Final  . Glucose, Bld 03/12/2013 182* 70 - 99 mg/dL Final  . BUN 96/07/5407 19  6 - 23 mg/dL Final  . Creatinine, Ser 03/12/2013 0.9  0.4 - 1.2 mg/dL Final  . Calcium 81/19/1478 9.5  8.4 - 10.5 mg/dL Final  . GFR 29/56/2130 82.08  >60.00 mL/min Final     Wt Readings from Last 3 Encounters:  03/18/13 182 lb 1.6 oz (82.6 kg)  03/05/13 187 lb (84.823 kg)  02/21/13 179 lb (81.194 kg)      Medication List       This list is accurate as of: 03/18/13  9:42 AM.  Always use your most recent med list.               aspirin 325 MG EC tablet  Take 1 tablet (325 mg total) by mouth daily.     benazepril 20 MG tablet  Commonly known as:  LOTENSIN  Take 1 tablet (20 mg total) by mouth daily.     carvedilol 12.5 MG tablet  Commonly known as:  COREG  Take 1  tablet (12.5 mg total) by mouth 2 (two) times daily.     cholecalciferol 1000 UNITS tablet  Commonly known as:  VITAMIN D  Take 1,000 Units by mouth daily.     Co Q-10 100 MG Caps  Take 1 capsule by mouth 2 (two) times daily.     felodipine 10 MG 24 hr tablet  Commonly known as:  PLENDIL  Take 1 tablet (10 mg total) by mouth daily.     furosemide 40 MG tablet  Commonly known as:  LASIX  12.5 mg.     hydrochlorothiazide 12.5 MG capsule  Commonly known as:  MICROZIDE  Take 12.5 mg by mouth daily.     HYDROcodone-acetaminophen 5-325 MG per tablet  Commonly known as:  NORCO/VICODIN  Take 2 tablets by mouth every 4 (four) hours as needed for pain.     insulin glargine 100 UNIT/ML injection  Commonly known as:  LANTUS  Inject 18 Units into the skin every morning. Takes Lantus at night     insulin lispro 100 UNIT/ML Sopn  Commonly known as:  HUMALOG KWIKPEN  Inject 4-6 Units into the skin 2 (two) times daily.     insulin NPH 100 UNIT/ML injection  Commonly known as:  HUMULIN N  Inject 20 Units into the skin 2 (two) times daily at 8 am and 10 pm.     levothyroxine 100 MCG tablet  Commonly known as:  SYNTHROID, LEVOTHROID  Take 1 tablet (100 mcg total) by mouth daily.     Liraglutide 18 MG/3ML Sopn  Commonly known as:  VICTOZA  Inject 1.2 mg into the skin daily.     metFORMIN 750 MG 24 hr tablet  Commonly known as:  GLUCOPHAGE-XR  Take 1 tablet (750 mg total) by mouth 2 (two) times daily.     multivitamins ther. w/minerals Tabs tablet  Take 1 tablet by mouth daily.     rosuvastatin 20 MG tablet  Commonly known as:  CRESTOR  Take 20 mg by mouth every evening.     TUMS 500 MG chewable tablet  Generic drug:  calcium carbonate  Chew 1 tablet by mouth daily as needed for heartburn (indigestion).  Allergies: No Known Allergies  Past Medical History  Diagnosis Date  . Thyroid disease   . Diabetes mellitus   . Lumbago   . Aortic valve disorders     S/P AVR  06/2012  . Hyperlipidemia   . Heart murmur   . Hypertension   . Hypothyroidism   . Depression   . Shortness of breath   . Asthma     AS CHILD   . Arthritis     Past Surgical History  Procedure Laterality Date  . Tubal ligation    . Breast surgery      LT BREAST BX BENIGN  . Aortic valve replacement N/A 06/25/2012    Procedure: AORTIC VALVE REPLACEMENT (AVR);  Surgeon: [name], MD;  Location: [facility] OR;  Service: Open Heart Surgery;  Laterality: N/A;  . Coronary artery bypass graft N/A 06/25/2012    Procedure: Coronary artery bypass graft  times two using left internal mammary artery and right leg greater saphenous vein;  Surgeon: [name], MD;  Location: [facility] OR;  Service: Open Heart Surgery;  Laterality: N/A;  . Intraoperative transesophageal echocardiogram N/A 06/25/2012    Procedure: INTRAOPERATIVE TRANSESOPHAGEAL ECHOCARDIOGRAM;  Surgeon: [name], MD;  Location: [facility] OR;  Service: Open Heart Surgery;  Laterality: N/A;    Family History  Problem Relation Age of Onset  . Heart disease Father     Social History:  reports that she has never smoked. She does not have any smokeless tobacco history on file. She reports that she does not drink alcohol or use illicit drugs.  Review of Systems:  HYPERTENSION: taking multiple drugs  and now well  controlled  HYPOTHYROIDISM: She has had long-standing hypothyroidism  Her dosage has been changed periodically. She had a low TSH on her last visit some after a change in her dose and now TSH is back to normal with continuing 112 mcg HYPERLIPIDEMIA: The lipid abnormality consists of elevated LDL; has been variably controlled with Crestor. Being managed by PCP  Lab Results  Component Value Date   CHOL 183 02/21/2013   HDL 47.30 02/21/2013   LDLCALC 115* 02/21/2013   TRIG 106.0 02/21/2013   CHOLHDL 4 02/21/2013  '    Examination:   BP 122/60  Pulse 64  Temp(Src) 98.7 F (37.1 C)  Resp 12  Ht 5\' 3"  (1.6 m)  Wt  182 lb 1.6 oz (82.6 kg)  BMI 32.27 kg/m2  SpO2 98%  Body mass index is 32.27 kg/(m^2).   She has significant tenderness of the posterior fossa of the left knee. Has mild swelling of the upper calf area on the left without palpable vein and has mild tenderness No ankle edema  ASSESSMENT/ PLAN:   Diabetes type 2, uncontrolled   The patient's diabetes  appears to be still poorly controlled especially postprandially after breakfast and lunch and persisting until suppertime. She does not think this is related to her diet and she believes she is better compliant with taking her lunchtime insulin with her when eating out Also fasting readings are relatively higher now Fructosamine confirms overall inadequate control recently Since she did not benefit from Victoza 0.6 and had side effects of nausea from 1.2 mg will continue to work with her insulin for now  Will first give her a trial of increased doses of Humalog at breakfast and lunch and if this is not helpful will add NPH dose in the morning She will also take extra insulin at mealtimes if blood  sugar is high Also since she tends to have low sugars periodically after supper which is a light meal she will reduce the dose to 4 units, has not done this despite previous instructions and not being able to get enough coverage for her meals and snacks. She probably would do better with adding NPH insulin but she is reluctant to do so because of the cost of multiple medications Would also consider adding Invokana if not improved   HYPOTHYROIDISM: Her TSH is back to normal now.   Hypertension: Fairly well controlled   Left leg pain: She has tenderness on the posterior fossa of the knee and will refer her to orthopedics  Counseling time over 50% of today's 25 minute visit   Riki Gehring 03/18/2013, 9:42 AM

## 2013-03-19 ENCOUNTER — Encounter (HOSPITAL_COMMUNITY): Payer: Medicare Other

## 2013-03-21 ENCOUNTER — Encounter (HOSPITAL_COMMUNITY): Payer: Medicare Other

## 2013-03-24 ENCOUNTER — Encounter (HOSPITAL_COMMUNITY): Payer: Medicare Other

## 2013-03-26 ENCOUNTER — Encounter (HOSPITAL_COMMUNITY): Payer: Medicare Other

## 2013-03-31 ENCOUNTER — Encounter (HOSPITAL_COMMUNITY): Payer: Medicare Other

## 2013-04-02 ENCOUNTER — Ambulatory Visit: Payer: Self-pay | Admitting: Podiatrist

## 2013-04-02 ENCOUNTER — Encounter (HOSPITAL_COMMUNITY): Payer: Medicare Other

## 2013-04-04 ENCOUNTER — Encounter (HOSPITAL_COMMUNITY): Payer: Medicare Other

## 2013-04-07 ENCOUNTER — Encounter (HOSPITAL_COMMUNITY): Payer: Medicare Other

## 2013-04-08 ENCOUNTER — Other Ambulatory Visit: Payer: Self-pay

## 2013-04-08 DIAGNOSIS — Z1231 Encounter for screening mammogram for malignant neoplasm of breast: Secondary | ICD-10-CM

## 2013-04-09 ENCOUNTER — Encounter (HOSPITAL_COMMUNITY): Payer: Medicare Other

## 2013-04-11 ENCOUNTER — Encounter (HOSPITAL_COMMUNITY): Payer: Medicare Other

## 2013-04-14 ENCOUNTER — Encounter (HOSPITAL_COMMUNITY): Payer: Medicare Other

## 2013-04-16 ENCOUNTER — Encounter (HOSPITAL_COMMUNITY): Payer: Medicare Other

## 2013-04-18 ENCOUNTER — Encounter (HOSPITAL_COMMUNITY): Payer: Medicare Other

## 2013-04-21 ENCOUNTER — Encounter (HOSPITAL_COMMUNITY): Payer: Medicare Other

## 2013-04-23 ENCOUNTER — Encounter (HOSPITAL_COMMUNITY): Payer: Medicare Other

## 2013-04-28 ENCOUNTER — Encounter (HOSPITAL_COMMUNITY): Payer: Medicare Other

## 2013-04-30 ENCOUNTER — Encounter (HOSPITAL_COMMUNITY): Payer: Medicare Other

## 2013-05-02 ENCOUNTER — Encounter (HOSPITAL_COMMUNITY): Payer: Medicare Other

## 2013-05-05 ENCOUNTER — Encounter (HOSPITAL_COMMUNITY): Payer: Medicare Other

## 2013-05-07 ENCOUNTER — Encounter (HOSPITAL_COMMUNITY): Payer: Medicare Other

## 2013-05-09 ENCOUNTER — Encounter (HOSPITAL_COMMUNITY): Payer: Medicare Other

## 2013-05-12 ENCOUNTER — Encounter (HOSPITAL_COMMUNITY): Payer: Medicare Other

## 2013-05-13 ENCOUNTER — Other Ambulatory Visit: Payer: Self-pay | Admitting: *Deleted

## 2013-05-13 MED ORDER — INSULIN PEN NEEDLE 31G X 5 MM MISC
Status: DC
Start: 2013-05-13 — End: 2014-04-06

## 2013-05-14 ENCOUNTER — Ambulatory Visit: Payer: Medicare Other

## 2013-05-14 ENCOUNTER — Encounter (HOSPITAL_COMMUNITY): Payer: Medicare Other

## 2013-05-16 ENCOUNTER — Ambulatory Visit
Admission: RE | Admit: 2013-05-16 | Discharge: 2013-05-16 | Disposition: A | Discharge status: Home / Self Care | Payer: Medicare Other | Source: Ambulatory Visit

## 2013-05-16 ENCOUNTER — Encounter (HOSPITAL_COMMUNITY): Payer: Medicare Other

## 2013-05-16 DIAGNOSIS — Z1231 Encounter for screening mammogram for malignant neoplasm of breast: Secondary | ICD-10-CM

## 2013-05-19 ENCOUNTER — Encounter (HOSPITAL_COMMUNITY): Payer: Medicare Other

## 2013-05-19 ENCOUNTER — Ambulatory Visit (INDEPENDENT_AMBULATORY_CARE_PROVIDER_SITE_OTHER): Payer: Medicare Other | Admitting: Endocrinology

## 2013-05-19 ENCOUNTER — Encounter: Payer: Self-pay | Admitting: Endocrinology

## 2013-05-19 VITALS — BP 124/78 | HR 67 | Temp 98.2°F | Resp 14 | Ht 63.0 in | Wt 186.9 lb

## 2013-05-19 DIAGNOSIS — I10 Essential (primary) hypertension: Secondary | ICD-10-CM

## 2013-05-19 DIAGNOSIS — E039 Hypothyroidism, unspecified: Secondary | ICD-10-CM

## 2013-05-19 DIAGNOSIS — E1165 Type 2 diabetes mellitus with hyperglycemia: Principal | ICD-10-CM

## 2013-05-19 DIAGNOSIS — IMO0001 Reserved for inherently not codable concepts without codable children: Secondary | ICD-10-CM

## 2013-05-19 DIAGNOSIS — E785 Hyperlipidemia, unspecified: Secondary | ICD-10-CM

## 2013-05-19 NOTE — Patient Instructions (Signed)
HUMALOG NEEDS TO TO BE TAKEN BEFORE EATING  INVOKANA IN AMS, REDUCE ALL insulin DOSES BY 2 UNITS except at lunch  Stop HCTZ

## 2013-05-19 NOTE — Progress Notes (Signed)
[name] is an 80 y.o. female.   Reason for Appointment: Diabetes follow-up   History of Present Illness   Diagnosis: Type 2 DIABETES MELITUS, date of diagnosis:   2000   She has been on insulin since 2004 with difficulty controlling adequately because of compliance with diet and variability in blood sugars. Her previous records are not available at present, however she has been on basal insulin with mealtime coverage since at least 2007 She has been tried on Byetta previously but did not tolerate this well She tried Victoza but didn't not benefit from Victoza 0.6 dosage; had nausea from 1.2 mg; did not continue this because of this side effect  RECENT history: With increasing her Humalog at breakfast on the last visit to 8 units her blood sugars around lunchtime a relatively better However she is checking blood sugars mostly in the morning and sporadically later in the day Her blood sugars are again the highest after lunch; this is likely to be from her not taking her insulin before eating despite repeated instructions and explanation of need to take mealtime insulin at the time of eating a meal Fasting readings are relatively better and more consistent She she thinks her portions are controlled but is concerned about difficulty losing weight: Did not lose any weight with Victoza She is still having periodic snacks in between meals, she thinks this is out of boredom  Oral hypoglycemic drugs: Metformin        .  INSULIN: Lantus 18 units daily, HUMALOG 8--6--4 or 6 AC  3 times a day       Proper timing of medications in relation to meals: not at lunch.          Monitors blood glucose:  2.8 times daily     Glucometer: One Touch.          Blood Glucose readings from meter download:   PREMEAL Breakfast Lunch Dinner Bedtime Overall  Glucose range:  82-158   130-165   96-252   69, 131    Mean/median:  110    225    150    POST-MEAL PC Breakfast PC Lunch PC Dinner  Glucose range:   165, 181   119-316    Mean/median:   248      Hypoglycemia frequency:  2x after supper recently .          Meals: 3 meals per day. her supper  is light;  lunch 12-2 pm   Physical activity: exercise: walks 30 min off and on         Lab Results  Component Value Date   HGBA1C 7.8* 01/30/2013   HGBA1C 7.7* 06/21/2012   Lab Results  Component Value Date   LDLCALC 115* 02/21/2013   CREATININE 0.9 03/12/2013    Wt Readings from Last 3 Encounters:  05/19/13 186 lb 14.4 oz (84.777 kg)  03/18/13 182 lb 1.6 oz (82.6 kg)  03/05/13 187 lb (84.823 kg)      Medication List       This list is accurate as of: 05/19/13 11:30 AM.  Always use your most recent med list.               aspirin 325 MG EC tablet  Take 1 tablet (325 mg total) by mouth daily.     benazepril 20 MG tablet  Commonly known as:  LOTENSIN  Take 1 tablet (20 mg total) by mouth daily.     carvedilol 12.5 MG  tablet  Commonly known as:  COREG  Take 1 tablet (12.5 mg total) by mouth 2 (two) times daily.     cholecalciferol 1000 UNITS tablet  Commonly known as:  VITAMIN D  Take 1,000 Units by mouth daily.     Co Q-10 100 MG Caps  Take 1 capsule by mouth 2 (two) times daily.     felodipine 10 MG 24 hr tablet  Commonly known as:  PLENDIL  Take 1 tablet (10 mg total) by mouth daily.     furosemide 40 MG tablet  Commonly known as:  LASIX  12.5 mg.     hydrochlorothiazide 12.5 MG capsule  Commonly known as:  MICROZIDE  Take 12.5 mg by mouth daily.     HYDROcodone-acetaminophen 5-325 MG per tablet  Commonly known as:  NORCO/VICODIN  Take 2 tablets by mouth every 4 (four) hours as needed for pain.     insulin glargine 100 UNIT/ML injection  Commonly known as:  LANTUS  Inject 22 Units into the skin every morning.     insulin lispro 100 UNIT/ML KiwkPen  Commonly known as:  HUMALOG  Inject 6-8 Units into the skin 2 (two) times daily.     Insulin Pen Needle 31G X 5 MM Misc  Use as directed twice a day      levothyroxine 100 MCG tablet  Commonly known as:  SYNTHROID, LEVOTHROID  Take 1 tablet (100 mcg total) by mouth daily.     metFORMIN 750 MG 24 hr tablet  Commonly known as:  GLUCOPHAGE-XR  Take 1 tablet (750 mg total) by mouth 2 (two) times daily.     multivitamins ther. w/minerals Tabs tablet  Take 1 tablet by mouth daily.     rosuvastatin 20 MG tablet  Commonly known as:  CRESTOR  Take 1 tablet (20 mg total) by mouth every evening.     TUMS 500 MG chewable tablet  Generic drug:  calcium carbonate  Chew 1 tablet by mouth daily as needed for heartburn (indigestion).        Allergies: No Known Allergies  Past Medical History  Diagnosis Date  . Thyroid disease   . Diabetes mellitus   . Lumbago   . Aortic valve disorders     S/P AVR 06/2012  . Hyperlipidemia   . Heart murmur   . Hypertension   . Hypothyroidism   . Depression   . Shortness of breath   . Asthma     AS CHILD   . Arthritis     Past Surgical History  Procedure Laterality Date  . Tubal ligation    . Breast surgery      LT BREAST BX BENIGN  . Aortic valve replacement N/A 06/25/2012    Procedure: AORTIC VALVE REPLACEMENT (AVR);  Surgeon: [name], MD;  Location: [facility] OR;  Service: Open Heart Surgery;  Laterality: N/A;  . Coronary artery bypass graft N/A 06/25/2012    Procedure: Coronary artery bypass graft  times two using left internal mammary artery and right leg greater saphenous vein;  Surgeon: [name], MD;  Location: [facility] OR;  Service: Open Heart Surgery;  Laterality: N/A;  . Intraoperative transesophageal echocardiogram N/A 06/25/2012    Procedure: INTRAOPERATIVE TRANSESOPHAGEAL ECHOCARDIOGRAM;  Surgeon: [name], MD;  Location: [facility] OR;  Service: Open Heart Surgery;  Laterality: N/A;    Family History  Problem Relation Age of Onset  . Heart disease Father     Social History:  reports that she has  never smoked. She does not have any smokeless tobacco history on file. She  reports that she does not drink alcohol or use illicit drugs.  Review of Systems:  HYPERTENSION: taking multiple drugs  and now well  controlled  HYPOTHYROIDISM: She has had long-standing hypothyroidism  Her dosage has been changed periodically. She had a low TSH previously and TSH was back to normal with continuing 100 mcg  Lab Results  Component Value Date   TSH 0.43 03/12/2013    HYPERLIPIDEMIA: The lipid abnormality consists of elevated LDL; has been variably controlled with Crestor. Being managed by PCP  Lab Results  Component Value Date   CHOL 183 02/21/2013   HDL 47.30 02/21/2013   LDLCALC 115* 02/21/2013   TRIG 106.0 02/21/2013   CHOLHDL 4 02/21/2013  '    Examination:   BP 124/78  Pulse 67  Temp(Src) 98.2 F (36.8 C)  Resp 14  Ht 5\' 3"  (1.6 m)  Wt 186 lb 14.4 oz (84.777 kg)  BMI 33.12 kg/m2  SpO2 97%  Body mass index is 33.12 kg/(m^2).   No ankle edema  ASSESSMENT/ PLAN:   Diabetes type 2, uncontrolled   The patient's diabetes  appears to be still poorly controlled especially postprandially after  Lunch as discussed in history of present illness She has done somewhat better with taking 2 units more Humalog at breakfast but is not checking postprandial readings very often See history of present illness for details of problems identified Her highest readings in the early afternoon are likely to be from not taking her insulin before eating at lunch but waiting at least half an hour after eating to do this when she comes home Fasting readings are consistent She has difficulty losing weight and controlling snacks also; however did not benefit from low-dose Victoza and could not tolerate 1.2 mg  Will give her a trial of  Invokana 100 mg daily as this will help with overall control as well as weight loss She may be able to reduce her insulin doses also especially basal Discussed mechanism of action, effects on glucose, blood pressure, fluid balance, possible side  effects and treatment of these. Discussed timing of medication and dosage; need to reduce her insulin except at lunchtime dose by 2 units overall as a precaution  Hypertension: Fairly well controlled but since she is going to be starting Invokana we'll stop her HCTZ for now  Hypothyroidism: To have labs checked in 2-3 months again  Counseling time for diabetes management, medications is over 50% of today's 25 minute visit   Cyrene Gharibian 05/19/2013, 11:30 AM

## 2013-05-21 ENCOUNTER — Other Ambulatory Visit: Payer: Self-pay | Admitting: *Deleted

## 2013-05-21 ENCOUNTER — Encounter (HOSPITAL_COMMUNITY): Payer: Medicare Other

## 2013-05-21 MED ORDER — CANAGLIFLOZIN 100 MG PO TABS: 100.0000 mg | ORAL_TABLET | Freq: Every day | ORAL | Status: DC

## 2013-05-22 LAB — HM DIABETES EYE EXAM

## 2013-05-23 ENCOUNTER — Encounter (HOSPITAL_COMMUNITY): Payer: Medicare Other

## 2013-05-23 ENCOUNTER — Encounter: Payer: Self-pay | Admitting: *Deleted

## 2013-05-26 ENCOUNTER — Encounter (HOSPITAL_COMMUNITY): Payer: Medicare Other

## 2013-05-28 ENCOUNTER — Encounter (HOSPITAL_COMMUNITY): Payer: Medicare Other

## 2013-05-30 ENCOUNTER — Encounter (HOSPITAL_COMMUNITY): Payer: Medicare Other

## 2013-06-02 ENCOUNTER — Encounter (HOSPITAL_COMMUNITY): Payer: Medicare Other

## 2013-06-02 ENCOUNTER — Other Ambulatory Visit (INDEPENDENT_AMBULATORY_CARE_PROVIDER_SITE_OTHER): Payer: Medicare Other

## 2013-06-02 DIAGNOSIS — E1165 Type 2 diabetes mellitus with hyperglycemia: Principal | ICD-10-CM

## 2013-06-02 DIAGNOSIS — E039 Hypothyroidism, unspecified: Secondary | ICD-10-CM

## 2013-06-02 DIAGNOSIS — IMO0001 Reserved for inherently not codable concepts without codable children: Secondary | ICD-10-CM

## 2013-06-02 LAB — HEMOGLOBIN A1C: Hgb A1c MFr Bld: 8 % — ABNORMAL HIGH (ref 4.6–6.5)

## 2013-06-02 LAB — MICROALBUMIN / CREATININE URINE RATIO
CREATININE, U: 93.2 mg/dL
MICROALB UR: 8.8 mg/dL — AB (ref 0.0–1.9)
Microalb Creat Ratio: 9.4 mg/g (ref 0.0–30.0)

## 2013-06-04 ENCOUNTER — Encounter (HOSPITAL_COMMUNITY): Payer: Medicare Other

## 2013-06-04 ENCOUNTER — Ambulatory Visit (INDEPENDENT_AMBULATORY_CARE_PROVIDER_SITE_OTHER): Payer: Medicare Other | Admitting: Podiatrist

## 2013-06-04 ENCOUNTER — Encounter: Payer: Self-pay | Admitting: Podiatrist

## 2013-06-04 VITALS — BP 135/58 | HR 71 | Resp 16

## 2013-06-04 DIAGNOSIS — M79609 Pain in unspecified limb: Secondary | ICD-10-CM

## 2013-06-04 DIAGNOSIS — B351 Tinea unguium: Secondary | ICD-10-CM

## 2013-06-04 NOTE — Progress Notes (Signed)
   HPI:  Patient presents today for follow up of foot and nail care.  She complains of warmth on the top of her feet.   Objective:  Patients chart is reviewed.  Vascular status reveals pedal pulses noted at 2 out of 4 dp and pt bilateral .  She complains of warmth on the top of her feet however skin temperature is normal. She has swollen ankles bilaterally. Neurological sensation is Normal to [employer] monofilament bilateral.  Patients nails are thickened, discolored, distrophic, friable and brittle with yellow-brown discoloration. Patient subjectively relates they are painful with shoes and with ambulation of bilateral feet.  Assessment:  Symptomatic onychomycosis  Plan:  Discussed treatment options and alternatives.  The symptomatic toenails were debrided through manual an mechanical means without complication.  Return appointment recommended at routine intervals of 3 months    Trudie Buckler, DPM

## 2013-06-04 NOTE — Patient Instructions (Addendum)
Diabetes and Foot Care Diabetes may cause you to have problems because of poor blood supply (circulation) to your feet and legs. This may cause the skin on your feet to become thinner, break easier, and heal more slowly. Your skin may become dry, and the skin may peel and crack. You may also have nerve damage in your legs and feet causing decreased feeling in them. You may not notice minor injuries to your feet that could lead to infections or more serious problems. Taking care of your feet is one of the most important things you can do for yourself.  HOME CARE INSTRUCTIONS  Wear shoes at all times, even in the house. Do not go barefoot. Bare feet are easily injured.  Check your feet daily for blisters, cuts, and redness. If you cannot see the bottom of your feet, use a mirror or ask someone for help.  Wash your feet with warm water (do not use hot water) and mild soap. Then pat your feet and the areas between your toes until they are completely dry. Do not soak your feet as this can dry your skin.  Apply a moisturizing lotion or petroleum jelly (that does not contain alcohol and is unscented) to the skin on your feet and to dry, brittle toenails. Do not apply lotion between your toes.  Trim your toenails straight across. Do not dig under them or around the cuticle. File the edges of your nails with an emery board or nail file.  Do not cut corns or calluses or try to remove them with medicine.  Wear clean socks or stockings every day. Make sure they are not too tight. Do not wear knee-high stockings since they may decrease blood flow to your legs.  Wear shoes that fit properly and have enough cushioning. To break in new shoes, wear them for just a few hours a day. This prevents you from injuring your feet. Always look in your shoes before you put them on to be sure there are no objects inside.  Do not cross your legs. This may decrease the blood flow to your feet.  If you find a minor scrape,  cut, or break in the skin on your feet, keep it and the skin around it clean and dry. These areas may be cleansed with mild soap and water. Do not cleanse the area with peroxide, alcohol, or iodine.  When you remove an adhesive bandage, be sure not to damage the skin around it.  If you have a wound, look at it several times a day to make sure it is healing.  Do not use heating pads or hot water bottles. They may burn your skin. If you have lost feeling in your feet or legs, you may not know it is happening until it is too late.  Make sure your health care provider performs a complete foot exam at least annually or more often if you have foot problems. Report any cuts, sores, or bruises to your health care provider immediately. SEEK MEDICAL CARE IF:   You have an injury that is not healing.  You have cuts or breaks in the skin.  You have an ingrown nail.  You notice redness on your legs or feet.  You feel burning or tingling in your legs or feet.  You have pain or cramps in your legs and feet.  Your legs or feet are numb.  Your feet always feel cold. SEEK IMMEDIATE MEDICAL CARE IF:   There is increasing redness,   swelling, or pain in or around a wound.  There is a red line that goes up your leg.  Pus is coming from a wound.  You develop a fever or as directed by your health care provider.  You notice a bad smell coming from an ulcer or wound. Document Released: 04/14/2000 Document Revised: 12/18/2012 Document Reviewed: 09/24/2012 [employer] Patient Information 2014 [employer].  

## 2013-06-05 ENCOUNTER — Encounter: Payer: Self-pay | Admitting: Cardiovascular Disease

## 2013-06-05 ENCOUNTER — Ambulatory Visit: Payer: Medicare Other | Admitting: Endocrinology

## 2013-06-05 ENCOUNTER — Ambulatory Visit (INDEPENDENT_AMBULATORY_CARE_PROVIDER_SITE_OTHER): Payer: Medicare Other | Admitting: Cardiovascular Disease

## 2013-06-05 VITALS — BP 130/76 | HR 57 | Ht 63.0 in | Wt 185.8 lb

## 2013-06-05 DIAGNOSIS — I10 Essential (primary) hypertension: Secondary | ICD-10-CM

## 2013-06-05 DIAGNOSIS — I2510 Atherosclerotic heart disease of native coronary artery without angina pectoris: Secondary | ICD-10-CM

## 2013-06-05 DIAGNOSIS — Z951 Presence of aortocoronary bypass graft: Secondary | ICD-10-CM

## 2013-06-05 DIAGNOSIS — R0602 Shortness of breath: Secondary | ICD-10-CM

## 2013-06-05 DIAGNOSIS — I350 Nonrheumatic aortic (valve) stenosis: Secondary | ICD-10-CM

## 2013-06-05 DIAGNOSIS — E785 Hyperlipidemia, unspecified: Secondary | ICD-10-CM

## 2013-06-05 DIAGNOSIS — I359 Nonrheumatic aortic valve disorder, unspecified: Secondary | ICD-10-CM

## 2013-06-05 LAB — BASIC METABOLIC PANEL
BUN: 15 mg/dL (ref 6–23)
CHLORIDE: 99 meq/L (ref 96–112)
CO2: 29 mEq/L (ref 19–32)
Calcium: 9.5 mg/dL (ref 8.4–10.5)
Creatinine, Ser: 0.9 mg/dL (ref 0.4–1.2)
GFR: 77.84 mL/min (ref >60.00)
Glucose, Bld: 111 mg/dL — ABNORMAL HIGH (ref 70–99)
POTASSIUM: 3.5 meq/L (ref 3.5–5.1)
SODIUM: 134 meq/L — AB (ref 135–145)

## 2013-06-05 LAB — CBC
HEMATOCRIT: 31.7 % — AB (ref 36.0–46.0)
HEMOGLOBIN: 10.4 g/dL — AB (ref 12.0–15.0)
MCHC: 32.7 g/dL (ref 30.0–36.0)
MCV: 94.6 fl (ref 78.0–100.0)
PLATELETS: 237 10*3/uL (ref 150.0–400.0)
RBC: 3.35 Mil/uL — ABNORMAL LOW (ref 3.87–5.11)
RDW: 13.7 % (ref 11.5–14.6)
WBC: 6.2 10*3/uL (ref 4.5–10.5)

## 2013-06-05 LAB — LIPID PANEL
CHOL/HDL RATIO: 3
Cholesterol: 129 mg/dL (ref 0–200)
HDL: 48.3 mg/dL (ref >39.00)
LDL CALC: 71 mg/dL (ref 0–99)
Triglycerides: 50 mg/dL (ref 0.0–149.0)
VLDL: 10 mg/dL (ref 0.0–40.0)

## 2013-06-05 NOTE — Progress Notes (Signed)
[name] Date of Birth: 1932-12-28 Medical Record #[id number]  Problem List 1. CAD - s/p CABG 2-27 14  2. Aortic stenosis - s/p 21 mm pericardial aortic valve 3. Post op atrial fibrillation 4. HTN   History of Present Illness: [name] is seen back today for a 2 week check.  She has had recent AVR and CABG x 2 per Dr. Servando Snare. This was with a LIMA to the LAD and SVG to the LCX,  with a pericardial Edwards tissue valve #21 mm. Did have post op atrial fib - on amiodarone. Other issues include past AS, DM, HLD, HTN and hypothryoidism.   I saw her 2 weeks ago. I gave her a short course of Lasix for some volume overload. We also stopped her amiodarone due to prolonged QT.   She comes back today. She is here alone. Weight is down 7 pounds. She is doing well. Feeling better and stronger. Still with some pedal edema - says she did not have this prior to her surgery. Still taking 40 mg of Lasix.  Walking in her house. Little scared about venturing outside. Ready to go to cardiac rehab. Not dizzy or lightheaded. No shortness of breath.   Sep 12, 2012:  [name] is doing well after her CABG and AVR.   She has a swollen area at one of the SVG harvest sites.  No redness. Mildly tender.    She has had some post op anemia.  Her potassium has been low.    Oct. 24, 2014:  [name] is doing well.  She is recovering well.  She needs to get out and exercise.    Feb. 5, 2015:  [name] has not been feeling well.  She has fatigue and dyspnea.   She also has chest fullness , gas / indigestion with even small amounts of exertion.   these symptoms are associated with diaphoresis.    She tries to get out and walk every day.   She had CABG and AVR 1 year ago.    An echocardiogram several months after her CA BG / AVR showed a normal prosthetic AV ( 21 mm bioprosthetic)   She felt well for several months after surgery and then started going down hill.   Current Outpatient Prescriptions on File  Prior to Visit  Medication Sig Dispense Refill  . aspirin EC 325 MG EC tablet Take 1 tablet (325 mg total) by mouth daily.  30 tablet    . benazepril (LOTENSIN) 20 MG tablet Take 1 tablet (20 mg total) by mouth daily.  30 tablet    . calcium carbonate (TUMS) 500 MG chewable tablet Chew 1 tablet by mouth daily as needed for heartburn (indigestion).      . carvedilol (COREG) 12.5 MG tablet Take 1 tablet (12.5 mg total) by mouth 2 (two) times daily.  60 tablet  6  . cholecalciferol (VITAMIN D) 1000 UNITS tablet Take 1,000 Units by mouth daily.      . Coenzyme Q10 (CO Q-10) 100 MG CAPS Take 1 capsule by mouth 2 (two) times daily.      . felodipine (PLENDIL) 10 MG 24 hr tablet Take 1 tablet (10 mg total) by mouth daily.  90 tablet  3  . hydrochlorothiazide (MICROZIDE) 12.5 MG capsule Take 12.5 mg by mouth daily.      . insulin glargine (LANTUS) 100 UNIT/ML injection Inject 22 Units into the skin every morning.       Marland Kitchen  insulin lispro (HUMALOG) 100 UNIT/ML KiwkPen Inject 6-8 Units into the skin 2 (two) times daily.      . Insulin Pen Needle 31G X 5 MM MISC Use as directed twice a day  60 each  5  . levothyroxine (SYNTHROID, LEVOTHROID) 100 MCG tablet Take 1 tablet (100 mcg total) by mouth daily.  30 tablet  5  . metFORMIN (GLUCOPHAGE-XR) 750 MG 24 hr tablet Take 1 tablet (750 mg total) by mouth 2 (two) times daily.  120 tablet  5  . Multiple Vitamins-Minerals (MULTIVITAMINS THER. W/MINERALS) TABS Take 1 tablet by mouth daily.      . rosuvastatin (CRESTOR) 20 MG tablet Take 1 tablet (20 mg total) by mouth every evening.  90 tablet  5   No current facility-administered medications on file prior to visit.    No Known Allergies  Past Medical History  Diagnosis Date  . Thyroid disease   . Diabetes mellitus   . Lumbago   . Aortic valve disorders     S/P AVR 06/2012  . Hyperlipidemia   . Heart murmur   . Hypertension   . Hypothyroidism   . Depression   . Shortness of breath   . Asthma     AS  CHILD   . Arthritis     Past Surgical History  Procedure Laterality Date  . Tubal ligation    . Breast surgery      LT BREAST BX BENIGN  . Aortic valve replacement N/A 06/25/2012    Procedure: AORTIC VALVE REPLACEMENT (AVR);  Surgeon: Grace Isaac, MD;  Location: [facility];  Service: Open Heart Surgery;  Laterality: N/A;  . Coronary artery bypass graft N/A 06/25/2012    Procedure: Coronary artery bypass graft  times two using left internal mammary artery and right leg greater saphenous vein;  Surgeon: Grace Isaac, MD;  Location: [facility];  Service: Open Heart Surgery;  Laterality: N/A;  . Intraoperative transesophageal echocardiogram N/A 06/25/2012    Procedure: INTRAOPERATIVE TRANSESOPHAGEAL ECHOCARDIOGRAM;  Surgeon: Grace Isaac, MD;  Location: [facility];  Service: Open Heart Surgery;  Laterality: N/A;    History  Smoking status  . Never Smoker   Smokeless tobacco  . Not on file    History  Alcohol Use No    Family History  Problem Relation Age of Onset  . Heart disease Father     Review of Systems: The review of systems is per the HPI.  All other systems were reviewed and are negative.  Physical Exam: BP 130/76  Pulse 57  Ht 5\' 3"  (1.6 m)  Wt 185 lb 12.8 oz (84.278 kg)  BMI 32.92 kg/m2 Patient is very pleasant and in no acute distress. Skin is warm and dry. Color is normal.  HEENT is unremarkable. Normocephalic/atraumatic. PERRL. Sclera are nonicteric. Neck is supple. No masses. No JVD. Lungs are clear. Cardiac exam shows a regular rate and rhythm.  Soft systolic murmur. Abdomen is soft. Extremities are without edema. Gait and ROM are intact. No gross neurologic deficits noted.  LABORATORY DATA: Pending   Lab Results  Component Value Date   WBC 5.4 09/12/2012   HGB 10.1* 09/12/2012   HCT 30.1* 09/12/2012   PLT 202.0 09/12/2012   GLUCOSE 182* 03/12/2013   CHOL 183 02/21/2013   TRIG 106.0 02/21/2013   HDL 47.30 02/21/2013   LDLCALC 115* 02/21/2013   ALT 13  02/21/2013   AST 20 02/21/2013   NA 133* 03/12/2013   K 4.1 03/12/2013  CL 98 03/12/2013   CREATININE 0.9 03/12/2013   BUN 19 03/12/2013   CO2 28 03/12/2013   TSH 0.43 03/12/2013   INR 1.57* 06/25/2012   HGBA1C 8.0* 06/02/2013   MICROALBUR 8.8* 06/02/2013    ECG: 06/05/2013, sinus bradycardia at 57 beats a minute. She has no ST or T wave changes. EKG is otherwise normal. Assessment / Plan:

## 2013-06-05 NOTE — Patient Instructions (Signed)
1) Your physician has requested that you have a lexiscan myoview.  Please follow instruction sheet, as given.  2) Your physician has requested that you have an echocardiogram. Echocardiography is a painless test that uses sound waves to create images of your heart. It provides your doctor with information about the size and shape of your heart and how well your heart's chambers and valves are working. This procedure takes approximately one hour. There are no restrictions for this procedure.  3) Your physician recommends that you return for lab work in: today lab work   4) Your physician recommends that you schedule a follow-up appointment in: 3 months with Dr Acie Fredrickson

## 2013-06-05 NOTE — Assessment & Plan Note (Signed)
Her valve looked normal several months after surgery. Valve sounds okay today. We'll get a repeat echocardiogram for further assessment of her aortic valve.

## 2013-06-05 NOTE — Assessment & Plan Note (Signed)
[name] is not making much progress as I would hope. She's feeling better several months after bypass grafting in her aortic valve surgery. We will schedule her for a Lexiscan myoview study to evaluate her for the possibility of coronary ischemia. I'll slightly an echocardiogram to ensure that her valve is working properly and that her left ventricular function is normal.  We'll also draw some labs today including a CBC, basic metabolic profile, fasting lipids, liver enzymes. I'll see her again in 3 months

## 2013-06-06 ENCOUNTER — Encounter (HOSPITAL_COMMUNITY): Payer: Medicare Other

## 2013-06-09 ENCOUNTER — Ambulatory Visit: Payer: Medicare Other | Admitting: Cardiovascular Disease

## 2013-06-09 ENCOUNTER — Encounter (HOSPITAL_COMMUNITY): Payer: Medicare Other

## 2013-06-10 NOTE — Progress Notes (Signed)
Quick Note:  Patient notified of lab results. Patient states she has been working on her cholesterol especially (results = 129). Verbalized agreement with current treatment plan. ______

## 2013-06-11 ENCOUNTER — Encounter (HOSPITAL_COMMUNITY): Payer: Medicare Other

## 2013-06-11 ENCOUNTER — Ambulatory Visit (HOSPITAL_COMMUNITY): Payer: Medicare Other | Attending: Cardiology | Admitting: Cardiology

## 2013-06-11 ENCOUNTER — Other Ambulatory Visit: Payer: Self-pay | Admitting: *Deleted

## 2013-06-11 DIAGNOSIS — I379 Nonrheumatic pulmonary valve disorder, unspecified: Secondary | ICD-10-CM | POA: Insufficient documentation

## 2013-06-11 DIAGNOSIS — I350 Nonrheumatic aortic (valve) stenosis: Secondary | ICD-10-CM

## 2013-06-11 DIAGNOSIS — E785 Hyperlipidemia, unspecified: Secondary | ICD-10-CM

## 2013-06-11 DIAGNOSIS — Z951 Presence of aortocoronary bypass graft: Secondary | ICD-10-CM | POA: Insufficient documentation

## 2013-06-11 DIAGNOSIS — I079 Rheumatic tricuspid valve disease, unspecified: Secondary | ICD-10-CM | POA: Insufficient documentation

## 2013-06-11 DIAGNOSIS — I359 Nonrheumatic aortic valve disorder, unspecified: Secondary | ICD-10-CM

## 2013-06-11 DIAGNOSIS — Z954 Presence of other heart-valve replacement: Secondary | ICD-10-CM | POA: Insufficient documentation

## 2013-06-11 DIAGNOSIS — R0602 Shortness of breath: Secondary | ICD-10-CM

## 2013-06-11 DIAGNOSIS — I2510 Atherosclerotic heart disease of native coronary artery without angina pectoris: Secondary | ICD-10-CM | POA: Insufficient documentation

## 2013-06-11 DIAGNOSIS — E119 Type 2 diabetes mellitus without complications: Secondary | ICD-10-CM | POA: Insufficient documentation

## 2013-06-11 DIAGNOSIS — I059 Rheumatic mitral valve disease, unspecified: Secondary | ICD-10-CM | POA: Insufficient documentation

## 2013-06-11 DIAGNOSIS — I10 Essential (primary) hypertension: Secondary | ICD-10-CM

## 2013-06-11 MED ORDER — INSULIN GLARGINE 100 UNIT/ML SOLOSTAR PEN: 22.0000 [IU] | PEN_INJECTOR | Freq: Every day | SUBCUTANEOUS | Status: DC

## 2013-06-11 NOTE — Progress Notes (Signed)
Echo performed. 

## 2013-06-13 ENCOUNTER — Encounter (HOSPITAL_COMMUNITY): Payer: Medicare Other

## 2013-06-16 ENCOUNTER — Encounter (HOSPITAL_COMMUNITY): Payer: Medicare Other

## 2013-06-18 ENCOUNTER — Encounter (HOSPITAL_COMMUNITY): Payer: Medicare Other

## 2013-06-19 ENCOUNTER — Encounter (HOSPITAL_COMMUNITY): Payer: Medicare Other

## 2013-06-19 ENCOUNTER — Ambulatory Visit: Payer: Medicare Other | Admitting: Endocrinology

## 2013-06-19 ENCOUNTER — Other Ambulatory Visit: Payer: Self-pay | Admitting: Endocrinology

## 2013-06-20 ENCOUNTER — Encounter (HOSPITAL_COMMUNITY): Payer: Medicare Other

## 2013-06-23 ENCOUNTER — Encounter (HOSPITAL_COMMUNITY): Payer: Medicare Other

## 2013-06-25 ENCOUNTER — Encounter (HOSPITAL_COMMUNITY): Payer: Medicare Other

## 2013-06-27 ENCOUNTER — Other Ambulatory Visit: Payer: Self-pay | Admitting: *Deleted

## 2013-06-27 ENCOUNTER — Encounter (HOSPITAL_COMMUNITY): Payer: Medicare Other

## 2013-06-27 MED ORDER — LEVOTHYROXINE SODIUM 100 MCG PO TABS
100.0000 ug | ORAL_TABLET | Freq: Every day | ORAL | Status: DC
Start: 2013-06-27 — End: 2013-09-05

## 2013-06-30 ENCOUNTER — Ambulatory Visit (INDEPENDENT_AMBULATORY_CARE_PROVIDER_SITE_OTHER): Payer: Medicare Other | Admitting: Endocrinology

## 2013-06-30 ENCOUNTER — Encounter (HOSPITAL_COMMUNITY): Payer: Medicare Other

## 2013-06-30 ENCOUNTER — Encounter: Payer: Self-pay | Admitting: Endocrinology

## 2013-06-30 VITALS — BP 138/70 | HR 56 | Temp 97.5°F | Resp 16 | Ht 63.0 in | Wt 188.4 lb

## 2013-06-30 DIAGNOSIS — E785 Hyperlipidemia, unspecified: Secondary | ICD-10-CM

## 2013-06-30 DIAGNOSIS — I2510 Atherosclerotic heart disease of native coronary artery without angina pectoris: Secondary | ICD-10-CM

## 2013-06-30 DIAGNOSIS — IMO0001 Reserved for inherently not codable concepts without codable children: Secondary | ICD-10-CM

## 2013-06-30 DIAGNOSIS — I10 Essential (primary) hypertension: Secondary | ICD-10-CM

## 2013-06-30 DIAGNOSIS — E039 Hypothyroidism, unspecified: Secondary | ICD-10-CM

## 2013-06-30 DIAGNOSIS — E1165 Type 2 diabetes mellitus with hyperglycemia: Principal | ICD-10-CM

## 2013-06-30 NOTE — Progress Notes (Signed)
[place]   Reason for Appointment: Diabetes follow-up   History of Present Illness   Diagnosis: Type 2 DIABETES MELITUS, date of diagnosis:   2000   She has been on insulin since 2004 with difficulty controlling adequately because of compliance with diet and variability in blood sugars. Her previous records are not available at present, however she has been on basal insulin with mealtime coverage since at least 2007 She has been tried on Byetta previously but did not tolerate this well She tried Victoza but didn't not benefit from Victoza 0.6 dosage; had nausea from 1.2 mg; did not continue this because of this side effect  RECENT history: 2/15 Because of continued poor control of her diabetes she was asked to start [place] on her last visit. However she did not start it because of fear of kidney damage and did not understand how the medication works Her blood sugars are still showing the same pattern with significantly high readings midday and afternoons and also known they seem to continue staying higher after dinner This is despite taking somewhat more Lantus, and this seems to help her fasting readings fairly well She is not complaining about the cost of Lantus insulin She is still having periodic snacks in between meals like fruit cups and also thinks that blood sugars may be higher from stress during the day She is not doing much walking lately and her weight has increased   Oral hypoglycemic drugs: Metformin        .  INSULIN: Lantus 22 units daily, HUMALOG 6-8 acb, acl; 4 or 6 AC  3 times a day       Proper timing of medications in relation to meals: not at lunch.          Monitors blood glucose:  3.3 times daily     Glucometer: One Touch.          Blood Glucose readings from meter download:   PREMEAL Breakfast Lunch Dinner Bedtime Overall  Glucose range:  97-163   66-272   178-270   143    median:  118      169+/-65    POST-MEAL PC Breakfast PC Lunch PC Dinner   Glucose range:  145-198  ?   108-309   Mean/median:    238     Hypoglycemia frequency:  none recently           Meals: 3 meals per day:  lunch 12-2 pm, dinner 5 pm, her supper is smaller  Physical activity: exercise: walks occasionally     Lab Results  Component Value Date   HGBA1C 8.0* 06/02/2013   HGBA1C 7.8* 01/30/2013   HGBA1C 7.7* 06/21/2012   Lab Results  Component Value Date   MICROALBUR 8.8* 06/02/2013   LDLCALC 71 06/05/2013   CREATININE 0.9 06/05/2013    Wt Readings from Last 3 Encounters:  06/30/13 188 lb 6.4 oz (85.458 kg)  06/05/13 185 lb 12.8 oz (84.278 kg)  05/19/13 186 lb 14.4 oz (84.777 kg)      Medication List       This list is accurate as of: 06/30/13  3:11 PM.  Always use your most recent med list.               aspirin 325 MG EC tablet  Take 1 tablet (325 mg total) by mouth daily.     benazepril 20 MG tablet  Commonly known as:  LOTENSIN  Take 1 tablet (20 mg total) by  mouth daily.     carvedilol 12.5 MG tablet  Commonly known as:  COREG  Take 1 tablet (12.5 mg total) by mouth 2 (two) times daily.     cholecalciferol 1000 UNITS tablet  Commonly known as:  VITAMIN D  Take 1,000 Units by mouth daily.     Co Q-10 100 MG Caps  Take 1 capsule by mouth 2 (two) times daily.     felodipine 10 MG 24 hr tablet  Commonly known as:  PLENDIL  Take 1 tablet (10 mg total) by mouth daily.     hydrochlorothiazide 12.5 MG capsule  Commonly known as:  MICROZIDE  Take 12.5 mg by mouth daily.     Insulin Glargine 100 UNIT/ML Solostar Pen  Commonly known as:  LANTUS SOLOSTAR  Inject 22 Units into the skin daily.     insulin lispro 100 UNIT/ML KiwkPen  Commonly known as:  HUMALOG  Inject 6-8 Units into the skin 2 (two) times daily.     Insulin Pen Needle 31G X 5 MM Misc  Use as directed twice a day     levothyroxine 100 MCG tablet  Commonly known as:  SYNTHROID, LEVOTHROID  Take 1 tablet (100 mcg total) by mouth daily before breakfast.     metFORMIN  750 MG 24 hr tablet  Commonly known as:  GLUCOPHAGE-XR  Take 1 tablet (750 mg total) by mouth 2 (two) times daily.     multivitamins ther. w/minerals Tabs tablet  Take 1 tablet by mouth daily.     rosuvastatin 20 MG tablet  Commonly known as:  CRESTOR  Take 1 tablet (20 mg total) by mouth every evening.     TUMS 500 MG chewable tablet  Generic drug:  calcium carbonate  Chew 1 tablet by mouth daily as needed for heartburn (indigestion).        Allergies: No Known Allergies  Past Medical History  Diagnosis Date  . Thyroid disease   . Diabetes mellitus   . Lumbago   . Aortic valve disorders     S/P AVR 06/2012  . Hyperlipidemia   . Heart murmur   . Hypertension   . Hypothyroidism   . Depression   . Shortness of breath   . Asthma     AS CHILD   . Arthritis     Past Surgical History  Procedure Laterality Date  . Tubal ligation    . Breast surgery      LT BREAST BX BENIGN  . Aortic valve replacement N/A 06/25/2012    Procedure: AORTIC VALVE REPLACEMENT (AVR);  Surgeon: Grace Isaac, MD;  Location: [facility];  Service: Open Heart Surgery;  Laterality: N/A;  . Coronary artery bypass graft N/A 06/25/2012    Procedure: Coronary artery bypass graft  times two using left internal mammary artery and right leg greater saphenous vein;  Surgeon: Grace Isaac, MD;  Location: [facility];  Service: Open Heart Surgery;  Laterality: N/A;  . Intraoperative transesophageal echocardiogram N/A 06/25/2012    Procedure: INTRAOPERATIVE TRANSESOPHAGEAL ECHOCARDIOGRAM;  Surgeon: Grace Isaac, MD;  Location: [facility];  Service: Open Heart Surgery;  Laterality: N/A;    Family History  Problem Relation Age of Onset  . Heart disease Father     Social History:  reports that she has never smoked. She does not have any smokeless tobacco history on file. She reports that she does not drink alcohol or use illicit drugs.  Review of Systems:  HYPERTENSION: taking multiple drugs  and now well   controlled. She is having some nocturia, does take her HCTZ in the morning  HYPOTHYROIDISM: She has had long-standing hypothyroidism  Her dosage has been changed periodically. She had a low TSH previously and TSH was back to normal with continuing 100 mcg  Lab Results  Component Value Date   TSH 0.43 03/12/2013    HYPERLIPIDEMIA: The lipid abnormality consists of elevated LDL; has been variably controlled with Crestor. Being managed by PCP. Recent LDL good done by cardiologist  Lab Results  Component Value Date   CHOL 129 06/05/2013   HDL 48.30 06/05/2013   LDLCALC 71 06/05/2013   TRIG 50.0 06/05/2013   CHOLHDL 3 06/05/2013  '   She says she has been quite depressed over the last month and was given a medication by her PCP but she refused to start taking this    Examination:   BP 138/70  Pulse 56  Temp(Src) 97.5 F (36.4 C)  Resp 16  Ht 5\' 3"  (1.6 m)  Wt 188 lb 6.4 oz (85.458 kg)  BMI 33.38 kg/m2  SpO2 99%  Body mass index is 33.38 kg/(m^2).   No ankle edema  ASSESSMENT/ PLAN:   Diabetes type 2, uncontrolled   The patient's diabetes  appears to be still poorly controlled with high nonfasting readings in the mid day and later part of the day Her blood sugars are overall averaging about 170 but much better in the morning She had been offered Invokana on the last visit but she misunderstood the possible side effects and did not take it Also did not adjust her mealtime doses at lunch and dinner with continued high readings Fasting readings are consistent and mostly near normal She has difficulty losing weight and controlling snacks also; previously did not benefit from low-dose Victoza and could not tolerate 1.2 mg  Explained to her in detail again how Collins works, effects on glucose, blood pressure, fluid balance, possible side effects and treatment of these. Discussed timing of medication and dosage; need to reduce her insulin including Lantus 2 units when blood sugars are  below at least 90 She will check more readings after lunch and also discussed adjusting mealtime coverage by 2-4 units when blood sugars are over 200 before eating  Hypertension: Fairly well controlled and she is going to be starting Invokana she can stop her HCTZ for now  Hypothyroidism: To have labs checked on the next visit  Counseling time  today over 50% of today's 25 minute visit   Gilberta Peeters 06/30/2013, 3:11 PM

## 2013-06-30 NOTE — Patient Instructions (Addendum)
Check cost of Levemir vs Lantus  Invokana before Bfst daily  Stop HCTZ  If sugar is > 200 any time add 2 more Humalog  Less fruit in between meals

## 2013-07-01 ENCOUNTER — Other Ambulatory Visit: Payer: Self-pay

## 2013-07-01 ENCOUNTER — Encounter (HOSPITAL_COMMUNITY): Payer: Medicare Other

## 2013-07-01 MED ORDER — CARVEDILOL 12.5 MG PO TABS: 12.5000 mg | ORAL_TABLET | Freq: Two times a day (BID) | ORAL | Status: DC

## 2013-07-02 ENCOUNTER — Encounter (HOSPITAL_COMMUNITY): Payer: Medicare Other

## 2013-07-04 ENCOUNTER — Encounter (HOSPITAL_COMMUNITY): Payer: Medicare Other

## 2013-07-07 ENCOUNTER — Encounter (HOSPITAL_COMMUNITY): Payer: Medicare Other

## 2013-07-07 ENCOUNTER — Telehealth: Payer: Self-pay | Admitting: *Deleted

## 2013-07-07 NOTE — Telephone Encounter (Signed)
Pt had cancelled lexiscan due to another app, explained how important it was to have//she will call back in a couple days to reschedule.

## 2013-07-09 ENCOUNTER — Encounter (HOSPITAL_COMMUNITY): Payer: Medicare Other

## 2013-07-11 ENCOUNTER — Encounter (HOSPITAL_COMMUNITY): Payer: Medicare Other

## 2013-07-14 ENCOUNTER — Encounter (HOSPITAL_COMMUNITY): Payer: Medicare Other

## 2013-07-16 ENCOUNTER — Encounter (HOSPITAL_COMMUNITY): Payer: Medicare Other

## 2013-07-18 ENCOUNTER — Encounter (HOSPITAL_COMMUNITY): Payer: Medicare Other

## 2013-07-21 ENCOUNTER — Encounter (HOSPITAL_COMMUNITY): Payer: Medicare Other

## 2013-07-22 ENCOUNTER — Encounter (HOSPITAL_COMMUNITY): Payer: Self-pay | Admitting: Emergency Medicine

## 2013-07-22 ENCOUNTER — Ambulatory Visit (HOSPITAL_COMMUNITY)
Admission: RE | Admit: 2013-07-22 | Discharge: 2013-07-22 | Disposition: A | Discharge status: Home / Self Care | Payer: Medicare Other | Source: Ambulatory Visit | Attending: Emergency Medicine | Admitting: Emergency Medicine

## 2013-07-22 ENCOUNTER — Emergency Department (INDEPENDENT_AMBULATORY_CARE_PROVIDER_SITE_OTHER)
Admission: EM | Admit: 2013-07-22 | Discharge: 2013-07-22 | Disposition: A | Discharge status: Home / Self Care | Payer: Medicare Other | Source: Home / Self Care | Attending: Emergency Medicine | Admitting: Emergency Medicine

## 2013-07-22 DIAGNOSIS — M79669 Pain in unspecified lower leg: Secondary | ICD-10-CM

## 2013-07-22 DIAGNOSIS — M79609 Pain in unspecified limb: Secondary | ICD-10-CM | POA: Insufficient documentation

## 2013-07-22 NOTE — ED Notes (Signed)
Patient complains of left leg and knee pain that started 3 weeks ago; states pain is worse while ambulating; states pain in back of knee that radiates up to hip.

## 2013-07-22 NOTE — Progress Notes (Signed)
VASCULAR LAB PRELIMINARY  PRELIMINARY  PRELIMINARY  PRELIMINARY  Left lower extremity venous duplex completed.    Preliminary report:  Left:  No evidence of DVT, superficial thrombosis, or Baker's cyst.  Keshonna Valvo, RVS 07/22/2013, 12:16 PM

## 2013-07-22 NOTE — ED Notes (Signed)
Called Vascular and gave report they are waiting for patient to do venus duplex of left lower extremity. Patient will be transferred via shuttle then returned via shuttle.

## 2013-07-22 NOTE — ED Provider Notes (Signed)
  Chief Complaint   Chief Complaint  Patient presents with  . Leg Pain    History of Present Illness   [name] is a 80 year old female who's had a two-week history of pain in the left calf with radiation into the posterior thigh. She cannot detect any obvious swelling. There's been no injury to the area. She denies any shortness of breath or chest pain. She's had no fever or chills. She does feel tired and rundown. She has no history of DVT or pulmonary embolism.  Review of Systems   Other than as noted above, the patient denies any of the following symptoms: Systemic:  No fever, chills, sweats, weight gain or loss. Respiratory:  No coughing, wheezing, or shortness of breath. Cardiac:  No chest pain, tightness, pressure or syncope. GI:  No abdominal pain, swelling, distension, nausea, or vomiting. GU:  No dysuria, frequency, or hematuria. Ext:  No joint pain or muscle pain. Skin:  No rash or itching. Neuro:  No paresthesias or muscle weakness.  [place]   Past medical history, family history, social history, meds, and allergies were reviewed.  She takes aspirin, Lotensin, carvedilol, vitamin D, coenzyme Q 10, below the pain, hydrochlorothiazide, Lantus insulin, Humalog, Synthroid, metformin, and Crestor. She has a history of aortic valvular disease with a prosthetic valve, diabetes, hypertension.  Physical Examination     Vital signs:  BP 137/63  Pulse 86  Temp(Src) 98.1 F (36.7 C) (Oral)  Resp 18  SpO2 100% Gen:  Alert, oriented, in no distress. Neck:  No tenderness, adenopathy, or JVD. Lungs:  Breath sounds clear and equal bilaterally.  No rales, rhonchi or wheezes. Heart:  Regular rhythm, no gallops or murmers. Abdomen:  Soft, nontender, no organomegaly or mass. Ext:  She has mild calf tenderness to palpation. No palpable cord, no distended veins, no pitting edema. Homans sign was negative. Neuro:  Alert and oriented times 3.  No muscle weakness.  Sensation intact to  light touch. Skin:  Warm and dry.  No rash or skin lesions.   [facility]   A venous Doppler was negative.  Assessment   The encounter diagnosis was Calf pain.  Differential diagnosis includes muscle strain, or ruptured Baker's cyst.  Plan   1.  Meds:  The following meds were prescribed:   Discharge Medication List as of 07/22/2013  1:04 PM      2.  Patient Education/Counseling:  The patient was given appropriate handouts, self care instructions, and instructed in symptomatic relief.  Advised rest, ice, elevation, and wrapping with an Ace wrap.  3.  Follow up:  The patient was told to follow up here if no better in 3 to 4 days, or sooner if becoming worse in any way, and given some red flag symptoms such as worsening swelling, chest pain, shortness of breath or fever which would prompt immediate return.       Harden Mo, MD 07/22/13 [phone]

## 2013-07-22 NOTE — Discharge Instructions (Signed)

## 2013-07-23 ENCOUNTER — Encounter (HOSPITAL_COMMUNITY): Payer: Medicare Other

## 2013-07-23 ENCOUNTER — Ambulatory Visit (HOSPITAL_COMMUNITY): Payer: Medicare Other | Attending: Cardiology | Admitting: Radiology

## 2013-07-23 ENCOUNTER — Encounter: Payer: Self-pay | Admitting: Cardiology

## 2013-07-23 VITALS — BP 164/68 | HR 55 | Ht 63.5 in | Wt 182.0 lb

## 2013-07-23 DIAGNOSIS — I359 Nonrheumatic aortic valve disorder, unspecified: Secondary | ICD-10-CM | POA: Insufficient documentation

## 2013-07-23 DIAGNOSIS — I350 Nonrheumatic aortic (valve) stenosis: Secondary | ICD-10-CM

## 2013-07-23 DIAGNOSIS — R5383 Other fatigue: Secondary | ICD-10-CM

## 2013-07-23 DIAGNOSIS — R5381 Other malaise: Secondary | ICD-10-CM | POA: Insufficient documentation

## 2013-07-23 DIAGNOSIS — I10 Essential (primary) hypertension: Secondary | ICD-10-CM

## 2013-07-23 DIAGNOSIS — R61 Generalized hyperhidrosis: Secondary | ICD-10-CM | POA: Insufficient documentation

## 2013-07-23 DIAGNOSIS — R0989 Other specified symptoms and signs involving the circulatory and respiratory systems: Secondary | ICD-10-CM | POA: Insufficient documentation

## 2013-07-23 DIAGNOSIS — Z951 Presence of aortocoronary bypass graft: Secondary | ICD-10-CM | POA: Insufficient documentation

## 2013-07-23 DIAGNOSIS — R0609 Other forms of dyspnea: Secondary | ICD-10-CM | POA: Insufficient documentation

## 2013-07-23 DIAGNOSIS — R0602 Shortness of breath: Secondary | ICD-10-CM

## 2013-07-23 DIAGNOSIS — I2510 Atherosclerotic heart disease of native coronary artery without angina pectoris: Secondary | ICD-10-CM

## 2013-07-23 DIAGNOSIS — E785 Hyperlipidemia, unspecified: Secondary | ICD-10-CM | POA: Insufficient documentation

## 2013-07-23 MED ORDER — TECHNETIUM TC 99M SESTAMIBI GENERIC - CARDIOLITE
11.0000 | Freq: Once | INTRAVENOUS | Status: AC | PRN
  Administered 2013-07-23: 11 via INTRAVENOUS

## 2013-07-23 MED ORDER — TECHNETIUM TC 99M SESTAMIBI GENERIC - CARDIOLITE
33.0000 | Freq: Once | INTRAVENOUS | Status: AC | PRN
  Administered 2013-07-23: 33 via INTRAVENOUS

## 2013-07-23 MED ORDER — REGADENOSON 0.4 MG/5ML IV SOLN
0.4000 mg | Freq: Once | INTRAVENOUS | Status: AC
  Administered 2013-07-23: 0.4 mg via INTRAVENOUS

## 2013-07-23 NOTE — Progress Notes (Signed)
[name] [address] [place], [place] [address] [phone]    Cardiology Nuclear Med Study  [name] is a 80 y.o. female     MRN : [id number]     DOB: 01-12-1933  Procedure Date: 07/23/2013  Nuclear Med Background Indication for Stress Test:  Evaluation for Ischemia and Graft Patency History:  CAD, Cath, CABG x2, AVR 2014, Echo 2015 EF 55-60% MPI 2009 EF 68% Cardiac Risk Factors: Family History - CAD, Hypertension, IDDM, and Lipids  Symptoms:  Diaphoresis, DOE, Fatigue and Chest Fullness   Nuclear Pre-Procedure Caffeine/Decaff Intake:  None > 12 hrs NPO After: 6:30pm   Lungs:  clear O2 Sat: 98% on room air. IV 0.9% NS with Angio Cath:  22g  IV Site: R Antecubital x 1, tolerated well IV Started by:  Irven Baltimore, RN  Chest Size (in):  34 Cup Size: B  Height: 5' 3.5" (1.613 m)  Weight:  182 lb (82.555 kg)  BMI:  Body mass index is 31.73 kg/(m^2). Tech Comments:  Patient took Coreg this am. Fasting CBG was 141 at 0630 today with 1/2 dose Insulin last night, no insulin or Glucophage today. Irven Baltimore, RN.    Nuclear Med Study 1 or 2 day study: 1 day  Stress Test Type:  Lexiscan  Reading MD: N/A  Order Authorizing Provider:  Mertie Moores, MD  Resting Radionuclide: Technetium 32m Sestamibi  Resting Radionuclide Dose: 11.0 mCi   Stress Radionuclide:  Technetium 48m Sestamibi  Stress Radionuclide Dose: 33.0 mCi           Stress Protocol Rest HR: 55 Stress HR: 68  Rest BP: 164/68 Stress BP: 134/77  Exercise Time (min): n/a METS: n/a           Dose of Adenosine (mg):  n/a Dose of Lexiscan: 0.4 mg  Dose of Atropine (mg): n/a Dose of Dobutamine: n/a mcg/kg/min (at max HR)  Stress Test Technologist: Glade Lloyd, BS-ES  Nuclear Technologist:  Charlton Amor, CNMT     Rest Procedure:  Myocardial perfusion imaging was performed at rest 45 minutes following the intravenous administration of Technetium 16m Sestamibi. Rest ECG: NSR  - Normal EKG  Stress Procedure:  The patient received IV Lexiscan 0.4 mg over 15-seconds.  Technetium 66m Sestamibi injected at 30-seconds.  Quantitative spect images were obtained after a 45 minute delay. Stress ECG: No significant change from baseline ECG  QPS Raw Data Images:  Normal; no motion artifact; normal heart/lung ratio. Stress Images:  There is decreased uptake in the anterior wall. Rest Images:  There is decreased uptake in the anterior wall. Subtraction (SDS):  There is a fixed anteriour defect that is most consistent with breast attenuation. Transient Ischemic Dilatation (Normal <1.22):  0.97 Lung/Heart Ratio (Normal <0.45):  0.27  Quantitative Gated Spect Images QGS EDV:  91 ml QGS ESV:  31 ml  Impression Exercise Capacity:  Lexiscan with no exercise. BP Response:  Normal blood pressure response. Clinical Symptoms:  No significant symptoms noted. ECG Impression:  There are scattered PVCs. Comparison with Prior Nuclear Study: No previous nuclear study performed  Overall Impression:  Low risk stress nuclear study small fixed distal anterior artifact which could be breast attenuation or other artifact.  LV Ejection Fraction: 66%.  LV Wall Motion:  Normal Wall Motion  [name], MD, [facility] Board Certified in Nuclear Cardiology Attending Cardiologist [place]

## 2013-07-25 ENCOUNTER — Encounter (HOSPITAL_COMMUNITY): Payer: Medicare Other

## 2013-07-25 ENCOUNTER — Telehealth: Payer: Self-pay | Admitting: Cardiovascular Disease

## 2013-07-25 NOTE — Telephone Encounter (Signed)
New message     Want results of lexiscan.

## 2013-07-25 NOTE — Telephone Encounter (Signed)
Called requesting results of stress test.  Notified of results.

## 2013-07-28 ENCOUNTER — Encounter (HOSPITAL_COMMUNITY): Payer: Medicare Other

## 2013-07-28 ENCOUNTER — Other Ambulatory Visit (INDEPENDENT_AMBULATORY_CARE_PROVIDER_SITE_OTHER): Payer: Medicare Other

## 2013-07-28 DIAGNOSIS — E039 Hypothyroidism, unspecified: Secondary | ICD-10-CM

## 2013-07-28 DIAGNOSIS — IMO0001 Reserved for inherently not codable concepts without codable children: Secondary | ICD-10-CM

## 2013-07-28 DIAGNOSIS — E1165 Type 2 diabetes mellitus with hyperglycemia: Principal | ICD-10-CM

## 2013-07-28 LAB — COMPREHENSIVE METABOLIC PANEL
ALK PHOS: 40 U/L (ref 39–117)
ALT: 15 U/L (ref 0–35)
AST: 21 U/L (ref 0–37)
Albumin: 4.3 g/dL (ref 3.5–5.2)
BUN: 19 mg/dL (ref 6–23)
CO2: 28 mEq/L (ref 19–32)
Calcium: 9.6 mg/dL (ref 8.4–10.5)
Chloride: 101 mEq/L (ref 96–112)
Creatinine, Ser: 1 mg/dL (ref 0.4–1.2)
GFR: 71.37 mL/min (ref >60.00)
Glucose, Bld: 192 mg/dL — ABNORMAL HIGH (ref 70–99)
Potassium: 3.9 mEq/L (ref 3.5–5.1)
SODIUM: 135 meq/L (ref 135–145)
TOTAL PROTEIN: 7.9 g/dL (ref 6.0–8.3)
Total Bilirubin: 0.6 mg/dL (ref 0.3–1.2)

## 2013-07-28 LAB — TSH: TSH: 0.03 u[IU]/mL — ABNORMAL LOW (ref 0.35–5.50)

## 2013-07-28 LAB — T4, FREE: Free T4: 1.4 ng/dL (ref 0.60–1.60)

## 2013-07-30 ENCOUNTER — Encounter (HOSPITAL_COMMUNITY): Payer: Medicare Other

## 2013-07-30 LAB — FRUCTOSAMINE: FRUCTOSAMINE: 317 umol/L — AB (ref 190–270)

## 2013-07-31 ENCOUNTER — Ambulatory Visit (INDEPENDENT_AMBULATORY_CARE_PROVIDER_SITE_OTHER): Payer: Medicare Other | Admitting: Endocrinology

## 2013-07-31 ENCOUNTER — Encounter: Payer: Self-pay | Admitting: Endocrinology

## 2013-07-31 VITALS — BP 130/70 | HR 52 | Temp 97.9°F | Resp 16 | Ht 63.0 in | Wt 185.0 lb

## 2013-07-31 DIAGNOSIS — E1165 Type 2 diabetes mellitus with hyperglycemia: Principal | ICD-10-CM

## 2013-07-31 DIAGNOSIS — IMO0001 Reserved for inherently not codable concepts without codable children: Secondary | ICD-10-CM

## 2013-07-31 DIAGNOSIS — I10 Essential (primary) hypertension: Secondary | ICD-10-CM

## 2013-07-31 DIAGNOSIS — E039 Hypothyroidism, unspecified: Secondary | ICD-10-CM

## 2013-07-31 DIAGNOSIS — I2510 Atherosclerotic heart disease of native coronary artery without angina pectoris: Secondary | ICD-10-CM

## 2013-07-31 NOTE — Progress Notes (Signed)
[name] 80 y.o.    Reason for Appointment: Diabetes follow-up   History of Present Illness   Diagnosis: Type 2 DIABETES MELITUS, date of diagnosis:   2000   She has been on insulin since 2004 with difficulty controlling adequately because of compliance with diet and variability in blood sugars. Her previous records are not available at present, however she has been on basal insulin with mealtime coverage since at least 2007 She has been tried on [place] previously but did not tolerate this well She tried Victoza but didn't not benefit from Victoza 0.6 dosage; had nausea from 1.2 mg; did not continue this because of this side effect  RECENT history:   Because of continued poor control of her diabetes she was asked again to start [place] on her last visit. She finally did start it and felt her sugars were improving but stopped it a couple of weeks ago because of frequent urination and some tendency to incontinence Also because her having a procedure  her insulin was reduced for a couple of days and blood sugars went over 300 Previously her A1c has not improved much and continued to be over 8%. Her A1c is relatively better but still high Overall her fasting blood sugars appear to be better with using Invokana. She taking it in the morning she is taking this in the afternoon; she thinks she is eating less food at suppertime Other problems:  Still inconsistent with taking her insulin before her lunch especially when eating out.  Some of her postprandial readings are still high from inconsistent diet  She is a having periodic snacks in between meals.  She is not doing much walking lately and her weight has increased   Oral hypoglycemic drugs: Metformin qd      .  INSULIN: Lantus 20-22 units daily, HUMALOG 6-8 acb, acl; 4 acs  3 times a day       Proper timing of medications in relation to meals: not at lunch.          Monitors blood glucose:  3.3 times daily     Glucometer: One  Touch.          Blood Glucose readings from meter download:   PREMEAL Breakfast Lunch Dinner  PCS  Overall  Glucose range:  62-197   103-338   13-323   63-200   62-338   Mean/median:  121   239     172+/-73    POST-MEAL PC Breakfast PC Lunch PC Dinner  Glucose range:  110-288   92-309    Mean/median:   188      Hypoglycemia: Glucose of 62 at 6 AM and 63 at 8:30 PM           Meals: 3 meals per day:  lunch 12-2 pm, dinner 5 pm, her supper is smaller  Physical activity: exercise: walks a little    Wt Readings from Last 3 Encounters:  07/31/13 185 lb (83.915 kg)  07/23/13 182 lb (82.555 kg)  06/30/13 188 lb 6.4 oz (85.458 kg)    Lab Results  Component Value Date   HGBA1C 8.0* 06/02/2013   HGBA1C 7.8* 01/30/2013   HGBA1C 7.7* 06/21/2012   Lab Results  Component Value Date   MICROALBUR 8.8* 06/02/2013   LDLCALC 71 06/05/2013   CREATININE 1.0 07/28/2013    Appointment on 07/28/2013  Component Date Value Ref Range Status  . Sodium 07/28/2013 135  135 - 145 mEq/L Final  . Potassium 07/28/2013  3.9  3.5 - 5.1 mEq/L Final  . Chloride 07/28/2013 101  96 - 112 mEq/L Final  . CO2 07/28/2013 28  19 - 32 mEq/L Final  . Glucose, Bld 07/28/2013 192* 70 - 99 mg/dL Final  . BUN 07/28/2013 19  6 - 23 mg/dL Final  . Creatinine, Ser 07/28/2013 1.0  0.4 - 1.2 mg/dL Final  . Total Bilirubin 07/28/2013 0.6  0.3 - 1.2 mg/dL Final  . Alkaline Phosphatase 07/28/2013 40  39 - 117 U/L Final  . AST 07/28/2013 21  0 - 37 U/L Final  . ALT 07/28/2013 15  0 - 35 U/L Final  . Total Protein 07/28/2013 7.9  6.0 - 8.3 g/dL Final  . Albumin 07/28/2013 4.3  3.5 - 5.2 g/dL Final  . Calcium 07/28/2013 9.6  8.4 - 10.5 mg/dL Final  . GFR 07/28/2013 71.37  >60.00 mL/min Final  . Fructosamine 07/28/2013 317* 190 - 270 umol/L Final  . Free T4 07/28/2013 1.40  0.60 - 1.60 ng/dL Final  . TSH 07/28/2013 0.03* 0.35 - 5.50 uIU/mL Final       Medication List       This list is accurate as of: 07/31/13  1:16 PM.  Always  use your most recent med list.               aspirin 325 MG EC tablet  Take 1 tablet (325 mg total) by mouth daily.     benazepril 20 MG tablet  Commonly known as:  LOTENSIN  Take 1 tablet (20 mg total) by mouth daily.     carvedilol 12.5 MG tablet  Commonly known as:  COREG  Take 1 tablet (12.5 mg total) by mouth 2 (two) times daily.     cholecalciferol 1000 UNITS tablet  Commonly known as:  VITAMIN D  Take 1,000 Units by mouth daily.     Co Q-10 100 MG Caps  Take 1 capsule by mouth 2 (two) times daily.     felodipine 10 MG 24 hr tablet  Commonly known as:  PLENDIL  Take 1 tablet (10 mg total) by mouth daily.     hydrochlorothiazide 12.5 MG capsule  Commonly known as:  MICROZIDE  Take 12.5 mg by mouth daily.     Insulin Glargine 100 UNIT/ML Solostar Pen  Commonly known as:  LANTUS SOLOSTAR  Inject 22 Units into the skin daily.     insulin lispro 100 UNIT/ML KiwkPen  Commonly known as:  HUMALOG  Inject 6-8 Units into the skin 2 (two) times daily.     Insulin Pen Needle 31G X 5 MM Misc  Use as directed twice a day     INVOKANA 100 MG Tabs  Generic drug:  Canagliflozin  Take by mouth.     levothyroxine 100 MCG tablet  Commonly known as:  SYNTHROID, LEVOTHROID  Take 1 tablet (100 mcg total) by mouth daily before breakfast.     metFORMIN 750 MG 24 hr tablet  Commonly known as:  GLUCOPHAGE-XR  Take 1 tablet (750 mg total) by mouth 2 (two) times daily.     multivitamins ther. w/minerals Tabs tablet  Take 1 tablet by mouth daily.     rosuvastatin 20 MG tablet  Commonly known as:  CRESTOR  Take 1 tablet (20 mg total) by mouth every evening.     TUMS 500 MG chewable tablet  Generic drug:  calcium carbonate  Chew 1 tablet by mouth daily as needed for heartburn (indigestion).  Allergies: No Known Allergies  Past Medical History  Diagnosis Date  . Thyroid disease   . Diabetes mellitus   . Lumbago   . Aortic valve disorders     S/P AVR 06/2012  .  Hyperlipidemia   . Heart murmur   . Hypertension   . Hypothyroidism   . Depression   . Shortness of breath   . Asthma     AS CHILD   . Arthritis     Past Surgical History  Procedure Laterality Date  . Tubal ligation    . Breast surgery      LT BREAST BX BENIGN  . Aortic valve replacement N/A 06/25/2012    Procedure: AORTIC VALVE REPLACEMENT (AVR);  Surgeon: Grace Isaac, MD;  Location: [facility];  Service: Open Heart Surgery;  Laterality: N/A;  . Coronary artery bypass graft N/A 06/25/2012    Procedure: Coronary artery bypass graft  times two using left internal mammary artery and right leg greater saphenous vein;  Surgeon: Grace Isaac, MD;  Location: [facility];  Service: Open Heart Surgery;  Laterality: N/A;  . Intraoperative transesophageal echocardiogram N/A 06/25/2012    Procedure: INTRAOPERATIVE TRANSESOPHAGEAL ECHOCARDIOGRAM;  Surgeon: Grace Isaac, MD;  Location: [facility];  Service: Open Heart Surgery;  Laterality: N/A;    Family History  Problem Relation Age of Onset  . Heart disease Father     Social History:  reports that she has never smoked. She does not have any smokeless tobacco history on file. She reports that she does not drink alcohol or use illicit drugs.  Review of Systems:  HYPERTENSION: taking multiple drugs and now well  controlled.  HYPOTHYROIDISM: She has had long-standing hypothyroidism  Her dosage has been changed periodically. She had a low TSH previously and Synthroid reduced slightly However TSH is relatively lower now  Lab Results  Component Value Date   TSH 0.03* 07/28/2013    HYPERLIPIDEMIA: The lipid abnormality consists of elevated LDL; has been variably controlled with Crestor. Being managed by PCP. Recent LDL is below done by cardiologist  Lab Results  Component Value Date   CHOL 129 06/05/2013   HDL 48.30 06/05/2013   LDLCALC 71 06/05/2013   TRIG 50.0 06/05/2013   CHOLHDL 3 06/05/2013  '    No recent edema   Examination:   BP  130/70  Pulse 52  Temp(Src) 97.9 F (36.6 C)  Resp 16  Ht 5\' 3"  (1.6 m)  Wt 185 lb (83.915 kg)  BMI 32.78 kg/m2  SpO2 97%  Body mass index is 32.78 kg/(m^2).   No ankle edema  ASSESSMENT/ PLAN:   Diabetes type 2, uncontrolled   The patient's diabetes has been somewhat better with adding Invokana Fructosamine is improved although A1c not due yet. Previously this was 8.0% Her blood sugars are overall averaging about 170 but much better in the morning She had been benefiting from [place] since the last visit but she was having frequent urination and some incontinence and stopped it recently Also has somewhat inconsistent regimen of diet and mealtime insulin despite instructions as discussed in history of present illness Also is taking metformin only once a day Fasting readings are consistent and mostly near normal with current regimen of Lantus She has difficulty losing weight and controlling snacks also; previously did not benefit from low-dose Victoza and could not tolerate 1.2 mg She agrees to restart Invokana and today's recommendations are:  Take Invokana before breakfast, try 1/2 pill for 5 days and  then one tablet a day Lantus 20 Lantus, to adjust only a fasting blood sugars are not within target, discussed Take both Metformin in ams More regular walking Better compliance with lunchtime insulin  Hypertension: Fairly well controlled with starting Invokana she can continue leaving off her HCTZ   Hypothyroidism: TSH is lower than before. Reduce Synthroid to 88ug daily To have labs checked on the next visit again  Counseling time  today over 50% of today's 25 minute visit   Tyriana Helmkamp 07/31/2013, 1:16 PM

## 2013-07-31 NOTE — Patient Instructions (Addendum)
Take Invokana before breakfast, try 1/2 pill for 5 days Lantus 20 Lantus  Take both Metformin in ams  Reduce Synthroid to 88ug daily

## 2013-08-01 ENCOUNTER — Encounter (HOSPITAL_COMMUNITY): Payer: Medicare Other

## 2013-08-01 ENCOUNTER — Ambulatory Visit: Payer: Medicare Other | Admitting: Endocrinology

## 2013-08-04 ENCOUNTER — Encounter (HOSPITAL_COMMUNITY): Payer: Medicare Other

## 2013-08-06 ENCOUNTER — Encounter (HOSPITAL_COMMUNITY): Payer: Medicare Other

## 2013-08-08 ENCOUNTER — Encounter (HOSPITAL_COMMUNITY): Payer: Medicare Other

## 2013-08-11 ENCOUNTER — Encounter (HOSPITAL_COMMUNITY): Payer: Medicare Other

## 2013-08-13 ENCOUNTER — Encounter (HOSPITAL_COMMUNITY): Payer: Medicare Other

## 2013-08-15 ENCOUNTER — Encounter (HOSPITAL_COMMUNITY): Payer: Medicare Other

## 2013-08-18 ENCOUNTER — Encounter (HOSPITAL_COMMUNITY): Payer: Medicare Other

## 2013-08-20 ENCOUNTER — Encounter (HOSPITAL_COMMUNITY): Payer: Medicare Other

## 2013-08-22 ENCOUNTER — Other Ambulatory Visit: Payer: Self-pay | Admitting: Cardiovascular Disease

## 2013-08-22 ENCOUNTER — Encounter (HOSPITAL_COMMUNITY): Payer: Medicare Other

## 2013-08-25 ENCOUNTER — Encounter (HOSPITAL_COMMUNITY): Payer: Medicare Other

## 2013-08-26 IMAGING — CR DG CHEST 1V PORT
1 series · 1 of 1 positions shown · non-contrast
Comparison: Prior chest x-ray 06/26/2012

CLINICAL DATA: Postop aortic valve replacement

PORTABLE CHEST - 1 VIEW

[AP]
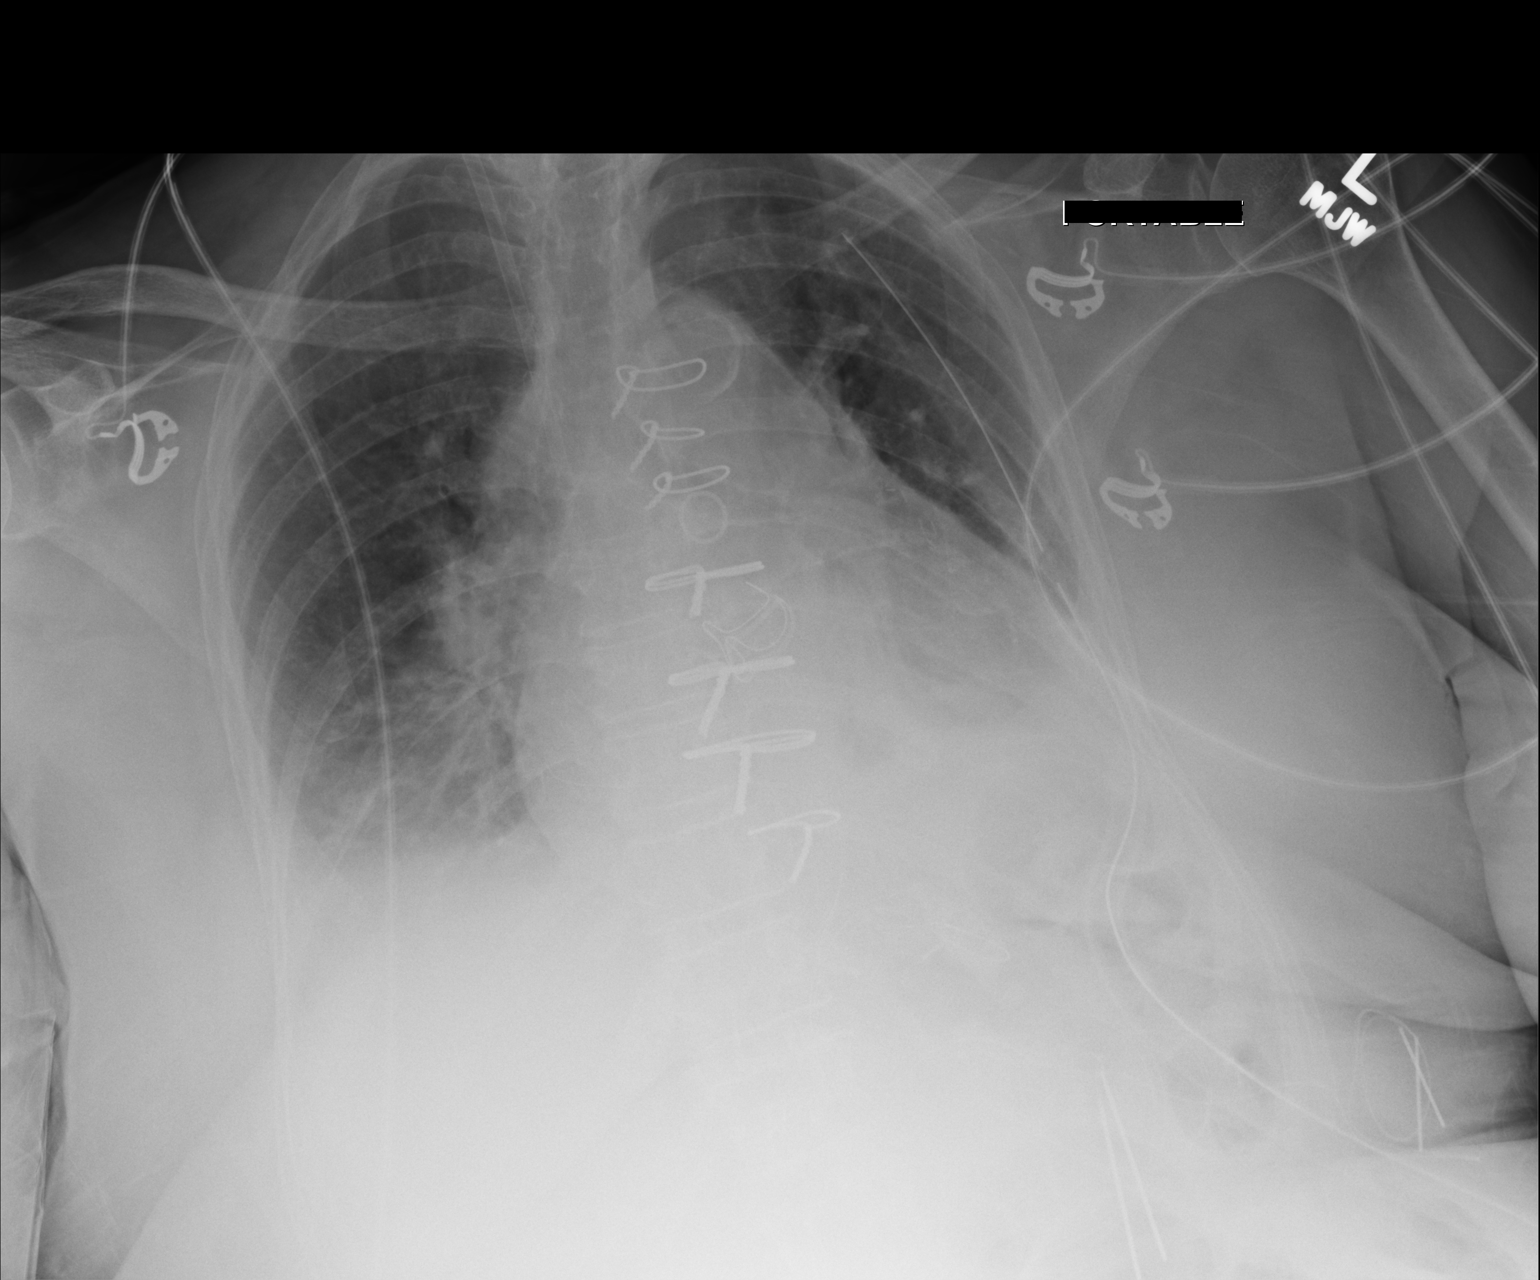

[1 of 1 positions shown; findings below may reference images not displayed]

FINDINGS: Interval removal of the Swan-Ganz catheter and subxiphoid
approach mediastinal drain.  The right IJ vascular sheath remains
in unchanged position with the tip in the mid superior vena cava.
Left thoracostomy tube is in unchanged position.  Stable mild
cardiomegaly.  Surgical changes of median sternotomy with evidence
of prior CABG and mitral valve replacement.  Slightly increased
bibasilar opacities and small bilateral effusions.  No
pneumothorax.  Mild vascular congestion without edema.
IMPRESSION: 1.  Slightly enlarged bilateral pleural effusions and associated
bibasilar atelectasis.
2.  Vascular congestion without overt edema.
3.  Interval removal of Swan-Ganz catheter and subxiphoid
mediastinal drain.

## 2013-08-27 ENCOUNTER — Encounter (HOSPITAL_COMMUNITY): Payer: Medicare Other

## 2013-08-27 IMAGING — CR DG CHEST 1V PORT
1 series · 1 of 1 positions shown · non-contrast
Comparison: Chest radiograph 06/27/2012

CLINICAL DATA: Postop tube check

PORTABLE CHEST - 1 VIEW

[AP]
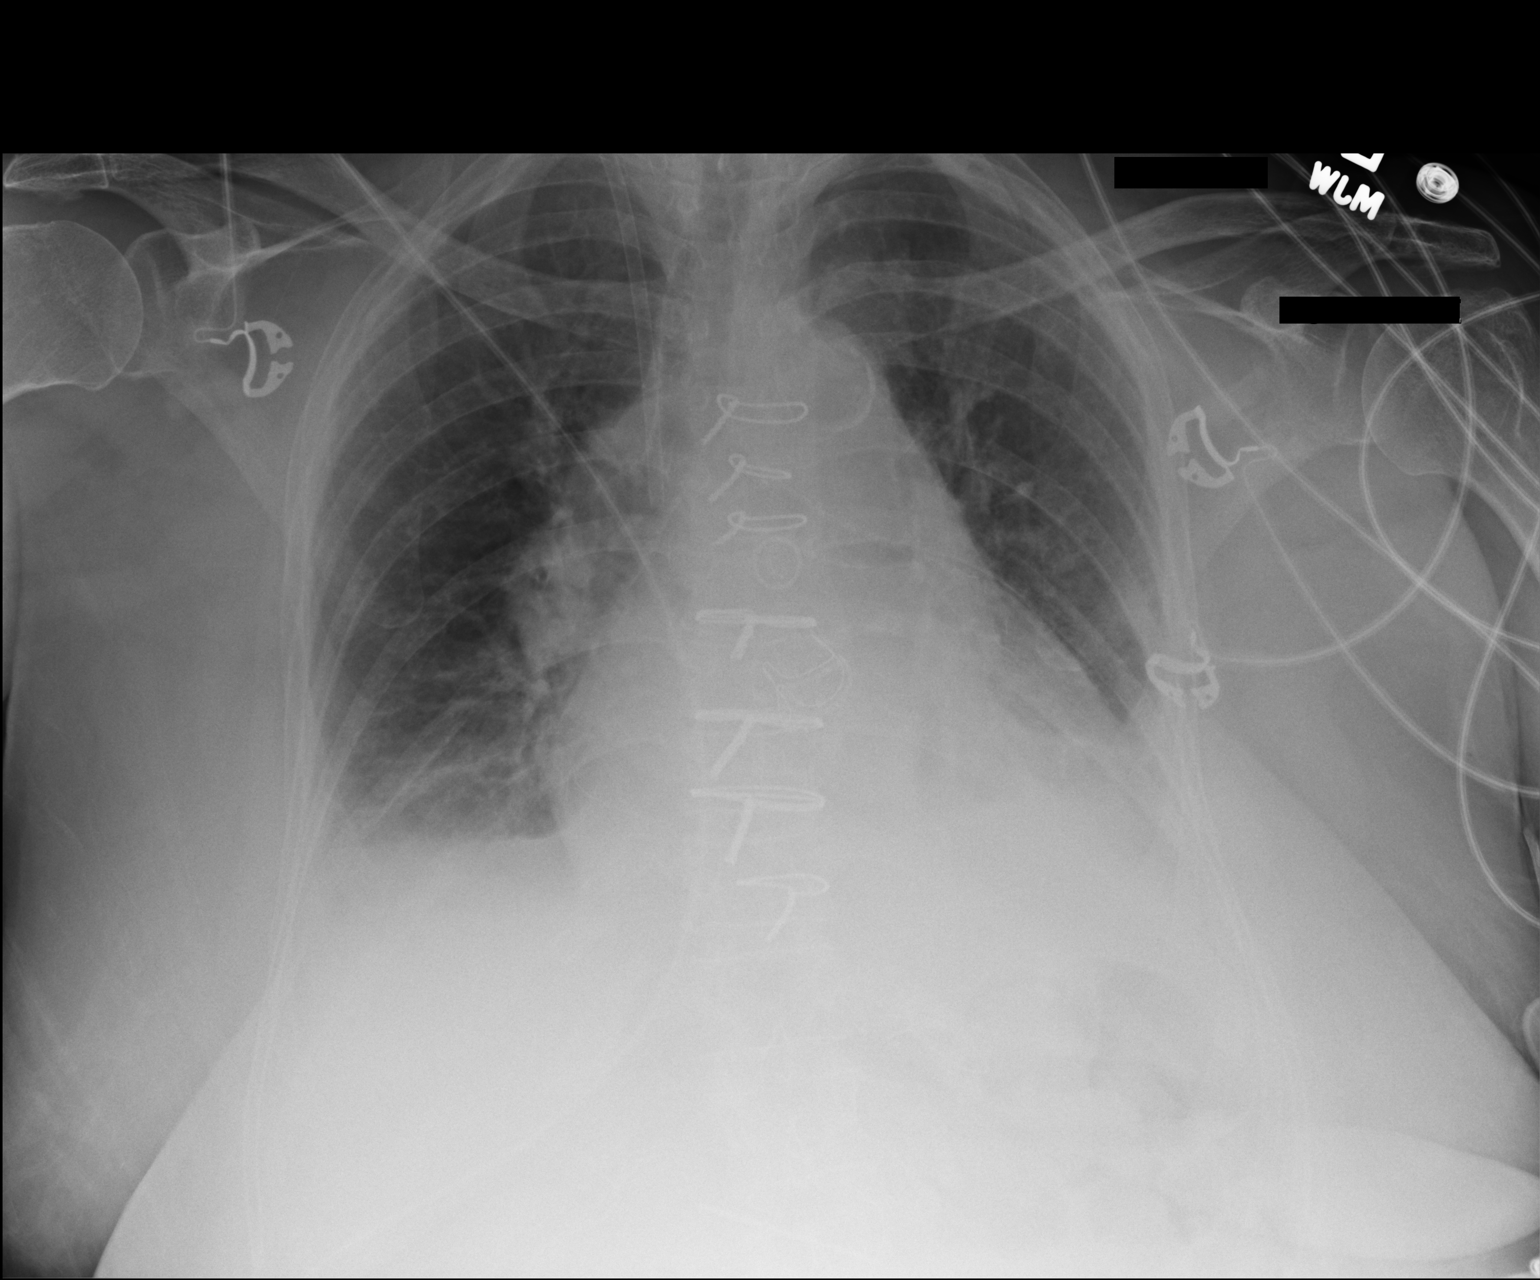

[1 of 1 positions shown; findings below may reference images not displayed]

FINDINGS: Sternotomy wires overlie stable cardiac silhouette.
Interval removal of the left chest tube.  No pneumothorax.  There
is persistent bibasilar atelectasis which is mild.  Right IJ sheath
remains.
IMPRESSION: 1.  Removal left chest tube without pneumothorax.
2.  Bibasilar atelectasis and small effusions.

## 2013-08-29 ENCOUNTER — Encounter (HOSPITAL_COMMUNITY): Payer: Medicare Other

## 2013-09-01 ENCOUNTER — Encounter (HOSPITAL_COMMUNITY): Payer: Medicare Other

## 2013-09-03 ENCOUNTER — Encounter (HOSPITAL_COMMUNITY): Payer: Medicare Other

## 2013-09-04 ENCOUNTER — Ambulatory Visit (INDEPENDENT_AMBULATORY_CARE_PROVIDER_SITE_OTHER): Payer: Medicare Other | Admitting: Podiatrist

## 2013-09-04 ENCOUNTER — Encounter: Payer: Self-pay | Admitting: Podiatrist

## 2013-09-04 VITALS — BP 144/80 | HR 61 | Resp 16 | Ht 63.0 in | Wt 183.0 lb

## 2013-09-04 DIAGNOSIS — M79609 Pain in unspecified limb: Secondary | ICD-10-CM

## 2013-09-04 DIAGNOSIS — B351 Tinea unguium: Secondary | ICD-10-CM

## 2013-09-04 NOTE — Patient Instructions (Signed)
It looks like you have to go to an actual retailer to get the shoes covered under medicare-- there isn't a place on their website for me to answer any questions or write a prescription for the shoes.    Top 10 retailer(s) closest to you:  [employer]  [address]  [place] [address]  Phone: [phone] 1.2 mile(s)  Get Directions  [facility]  [place], [place]  Phone: [phone] 3.7 mile(s)  Get Directions  [facility]  [place], [place]  Phone: [phone] 18.3 mile(s)  Get Directions  [facility]  [address]  [place], [place]  Phone: [phone] 21.9 mile(s)  Get Directions  [facility]  [address] [place], [place]

## 2013-09-04 NOTE — Progress Notes (Signed)
  HPI: Patient presents today for follow up of diabetic foot and nail care. Past medical history, meds, and allergies reviewed. Patient states blood sugar is under good  control.  She denies numbness or tingling.  She wants a pair of SAS shoes that are covered by medicare in their booklet she sent off for.   Objective:   Objective:  Patients chart is reviewed.  Vascular status reveals pedal pulses noted at  2 out of 4 dp and pt bilateral .  Neurological sensation is Normal to [employer] monofilament bilateral at 5/5 sites bilateral.  Dermatological exam reveals  absence of pre ulcerative/ hyperkeratotic lesions.   Toenails are elongated, incurvated, discolored, dystrophic with ingrown deformity present.  Hammertoe right 2nd digit is present   Assessment: Diabetes , hammertoe, painful mycotic nails  Plan: Discussed treatment options and alternatives. Debrided nails without complication. Discussed the SAS shoes and she would like for me to go on the Internet and right her prescription for the shoes they can be covered by insurance. I did investigate and there is no area for me to put comments or prescription into the website. I recommended that she go to one of the retailer's of the shoes and try and get them that way. She is diabetic but she has no classic findings and therefore her diabetic shoes we'll most likely not be covered for her. Return appointment recommended at routine intervals of 3 months.   Trudie Buckler, DPM

## 2013-09-05 ENCOUNTER — Other Ambulatory Visit: Payer: Self-pay | Admitting: *Deleted

## 2013-09-05 ENCOUNTER — Encounter (HOSPITAL_COMMUNITY): Payer: Medicare Other

## 2013-09-05 MED ORDER — LEVOTHYROXINE SODIUM 88 MCG PO TABS: 88.0000 ug | ORAL_TABLET | Freq: Every day | ORAL | Status: DC

## 2013-09-08 ENCOUNTER — Encounter (HOSPITAL_COMMUNITY): Payer: Medicare Other

## 2013-09-10 ENCOUNTER — Encounter (HOSPITAL_COMMUNITY): Payer: Medicare Other

## 2013-09-10 ENCOUNTER — Encounter: Payer: Self-pay | Admitting: Cardiovascular Disease

## 2013-09-10 ENCOUNTER — Ambulatory Visit (INDEPENDENT_AMBULATORY_CARE_PROVIDER_SITE_OTHER): Payer: Medicare Other | Admitting: Cardiovascular Disease

## 2013-09-10 VITALS — BP 139/68 | HR 57 | Ht 63.0 in | Wt 183.8 lb

## 2013-09-10 DIAGNOSIS — I350 Nonrheumatic aortic (valve) stenosis: Secondary | ICD-10-CM

## 2013-09-10 DIAGNOSIS — E785 Hyperlipidemia, unspecified: Secondary | ICD-10-CM

## 2013-09-10 DIAGNOSIS — I2510 Atherosclerotic heart disease of native coronary artery without angina pectoris: Secondary | ICD-10-CM

## 2013-09-10 DIAGNOSIS — I359 Nonrheumatic aortic valve disorder, unspecified: Secondary | ICD-10-CM

## 2013-09-10 DIAGNOSIS — Z951 Presence of aortocoronary bypass graft: Secondary | ICD-10-CM

## 2013-09-10 MED ORDER — ASPIRIN EC 81 MG PO TBEC: 81.0000 mg | DELAYED_RELEASE_TABLET | Freq: Every day | ORAL | Status: DC

## 2013-09-10 NOTE — Assessment & Plan Note (Signed)
Her valve sounds normal. She still having some fatigue.

## 2013-09-10 NOTE — Patient Instructions (Signed)
Your physician has recommended you make the following change in your medication:  DECREASE Aspirin to 81 mg once daily  Your physician wants you to follow-up in: 6 months with fasting lab work You will receive a reminder letter in the mail two months in advance. If you don't receive a letter, please call our office to schedule the follow-up appointment.  Your physician recommends that you return for lab work in: 6 months on the day of or a few days before your office visit with Dr. Vilinda Boehringer will need to fast for this appointment - nothing to eat or drink after midnight the night before except water

## 2013-09-10 NOTE — Assessment & Plan Note (Signed)
She's having lots of fatigue. She's now over a year out from a coronary artery bypass grafting. I've encouraged her her to exercise on regular basis.  See her again in 6 months. We'll check fasting lipids, liver enzymes, and basic metabolic profile at that time.

## 2013-09-10 NOTE — Progress Notes (Signed)
[name] Date of Birth: August 03, 1932 Medical Record #[id number]  Problem List 1. CAD - s/p CABG 2-27 14  2. Aortic stenosis - s/p 21 mm pericardial aortic valve 3. Post op atrial fibrillation 4. HTN   History of Present Illness: [name] is seen back today for a 2 week check.  She has had recent AVR and CABG x 2 per Dr. Servando Snare. This was with a LIMA to the LAD and SVG to the LCX,  with a pericardial Edwards tissue valve #21 mm. Did have post op atrial fib - on amiodarone. Other issues include past AS, DM, HLD, HTN and hypothryoidism.   I saw her 2 weeks ago. I gave her a short course of Lasix for some volume overload. We also stopped her amiodarone due to prolonged QT.   She comes back today. She is here alone. Weight is down 7 pounds. She is doing well. Feeling better and stronger. Still with some pedal edema - says she did not have this prior to her surgery. Still taking 40 mg of Lasix.  Walking in her house. Little scared about venturing outside. Ready to go to cardiac rehab. Not dizzy or lightheaded. No shortness of breath.   Sep 12, 2012:  [name] is doing well after her CABG and AVR.   She has a swollen area at one of the SVG harvest sites.  No redness. Mildly tender.    She has had some post op anemia.  Her potassium has been low.    Oct. 24, 2014:  [name] is doing well.  She is recovering well.  She needs to get out and exercise.    Feb. 5, 2015:  [name] has not been feeling well.  She has fatigue and dyspnea.   She also has chest fullness , gas / indigestion with even small amounts of exertion.   these symptoms are associated with diaphoresis.    She tries to get out and walk every day.   She had CABG and AVR 1 year ago.    An echocardiogram several months after her CA BG / AVR showed a normal prosthetic AV ( 21 mm bioprosthetic)   She felt well for several months after surgery and then started going down hill.   Sep 10, 2013:  [name] is doing OK.  She has  some fatigue at times.  Walks on occasion   Current Outpatient Prescriptions on File Prior to Visit  Medication Sig Dispense Refill  . aspirin EC 325 MG EC tablet Take 1 tablet (325 mg total) by mouth daily.  30 tablet    . benazepril (LOTENSIN) 20 MG tablet Take 1 tablet (20 mg total) by mouth daily.  30 tablet    . calcium carbonate (TUMS) 500 MG chewable tablet Chew 1 tablet by mouth daily as needed for heartburn (indigestion).      . Canagliflozin (INVOKANA) 100 MG TABS Take by mouth.      . carvedilol (COREG) 12.5 MG tablet TAKE 1 TABLET (12.5 MG TOTAL) BY MOUTH 2 (TWO) TIMES DAILY.  60 tablet  6  . cholecalciferol (VITAMIN D) 1000 UNITS tablet Take 1,000 Units by mouth daily.      . Coenzyme Q10 (CO Q-10) 100 MG CAPS Take 1 capsule by mouth 2 (two) times daily.      . felodipine (PLENDIL) 10 MG 24 hr tablet Take 1 tablet (10 mg total) by mouth daily.  90 tablet  3  . hydrochlorothiazide (MICROZIDE)  12.5 MG capsule Take 12.5 mg by mouth daily.      . Insulin Glargine (LANTUS SOLOSTAR) 100 UNIT/ML Solostar Pen Inject 22 Units into the skin daily.  5 pen  5  . insulin lispro (HUMALOG) 100 UNIT/ML KiwkPen Inject 6-8 Units into the skin 2 (two) times daily.      . Insulin Pen Needle 31G X 5 MM MISC Use as directed twice a day  60 each  5  . levothyroxine (SYNTHROID, LEVOTHROID) 88 MCG tablet Take 1 tablet (88 mcg total) by mouth daily.  30 tablet  3  . metFORMIN (GLUCOPHAGE-XR) 750 MG 24 hr tablet Take 1 tablet (750 mg total) by mouth 2 (two) times daily.  120 tablet  5  . Multiple Vitamins-Minerals (MULTIVITAMINS THER. W/MINERALS) TABS Take 1 tablet by mouth daily.      . rosuvastatin (CRESTOR) 20 MG tablet Take 1 tablet (20 mg total) by mouth every evening.  90 tablet  5   No current facility-administered medications on file prior to visit.    No Known Allergies  Past Medical History  Diagnosis Date  . Thyroid disease   . Diabetes mellitus   . Lumbago   . Aortic valve disorders      S/P AVR 06/2012  . Hyperlipidemia   . Heart murmur   . Hypertension   . Hypothyroidism   . Depression   . Shortness of breath   . Asthma     AS CHILD   . Arthritis     Past Surgical History  Procedure Laterality Date  . Tubal ligation    . Breast surgery      LT BREAST BX BENIGN  . Aortic valve replacement N/A 06/25/2012    Procedure: AORTIC VALVE REPLACEMENT (AVR);  Surgeon: Grace Isaac, MD;  Location: [facility];  Service: Open Heart Surgery;  Laterality: N/A;  . Coronary artery bypass graft N/A 06/25/2012    Procedure: Coronary artery bypass graft  times two using left internal mammary artery and right leg greater saphenous vein;  Surgeon: Grace Isaac, MD;  Location: [facility];  Service: Open Heart Surgery;  Laterality: N/A;  . Intraoperative transesophageal echocardiogram N/A 06/25/2012    Procedure: INTRAOPERATIVE TRANSESOPHAGEAL ECHOCARDIOGRAM;  Surgeon: Grace Isaac, MD;  Location: [facility];  Service: Open Heart Surgery;  Laterality: N/A;    History  Smoking status  . Never Smoker   Smokeless tobacco  . Not on file    History  Alcohol Use No    Family History  Problem Relation Age of Onset  . Heart disease Father     Review of Systems: The review of systems is per the HPI.  All other systems were reviewed and are negative.  Physical Exam: BP 139/68  Pulse 57  Ht 5\' 3"  (1.6 m)  Wt 183 lb 12.8 oz (83.371 kg)  BMI 32.57 kg/m2 Patient is very pleasant and in no acute distress. Skin is warm and dry. Color is normal.  HEENT is unremarkable. Normocephalic/atraumatic. PERRL. Sclera are nonicteric. Neck is supple. No masses. No JVD. Lungs are clear. Cardiac exam shows a regular rate and rhythm.  Soft systolic murmur. Abdomen is soft. Extremities are without edema. Gait and ROM are intact. No gross neurologic deficits noted.  LABORATORY DATA: Pending   Lab Results  Component Value Date   WBC 6.2 06/05/2013   HGB 10.4* 06/05/2013   HCT 31.7* 06/05/2013   PLT 237.0  06/05/2013   GLUCOSE 192* 07/28/2013   CHOL  129 06/05/2013   TRIG 50.0 06/05/2013   HDL 48.30 06/05/2013   LDLCALC 71 06/05/2013   ALT 15 07/28/2013   AST 21 07/28/2013   NA 135 07/28/2013   K 3.9 07/28/2013   CL 101 07/28/2013   CREATININE 1.0 07/28/2013   BUN 19 07/28/2013   CO2 28 07/28/2013   TSH 0.03* 07/28/2013   INR 1.57* 06/25/2012   HGBA1C 8.0* 06/02/2013   MICROALBUR 8.8* 06/02/2013    ECG: 06/05/2013, sinus bradycardia at 57 beats a minute. She has no ST or T wave changes. EKG is otherwise normal. Assessment / Plan:

## 2013-09-12 ENCOUNTER — Encounter (HOSPITAL_COMMUNITY): Payer: Medicare Other

## 2013-09-15 ENCOUNTER — Encounter (HOSPITAL_COMMUNITY): Payer: Medicare Other

## 2013-09-17 ENCOUNTER — Encounter (HOSPITAL_COMMUNITY): Payer: Medicare Other

## 2013-09-19 ENCOUNTER — Encounter (HOSPITAL_COMMUNITY): Payer: Medicare Other

## 2013-09-23 IMAGING — CR DG CHEST 2V
2 series · 2 of 2 positions shown · non-contrast
Comparison: 07/01/2012

CLINICAL DATA: CAD, post CABG.

CHEST - 2 VIEW

[w chest pa]
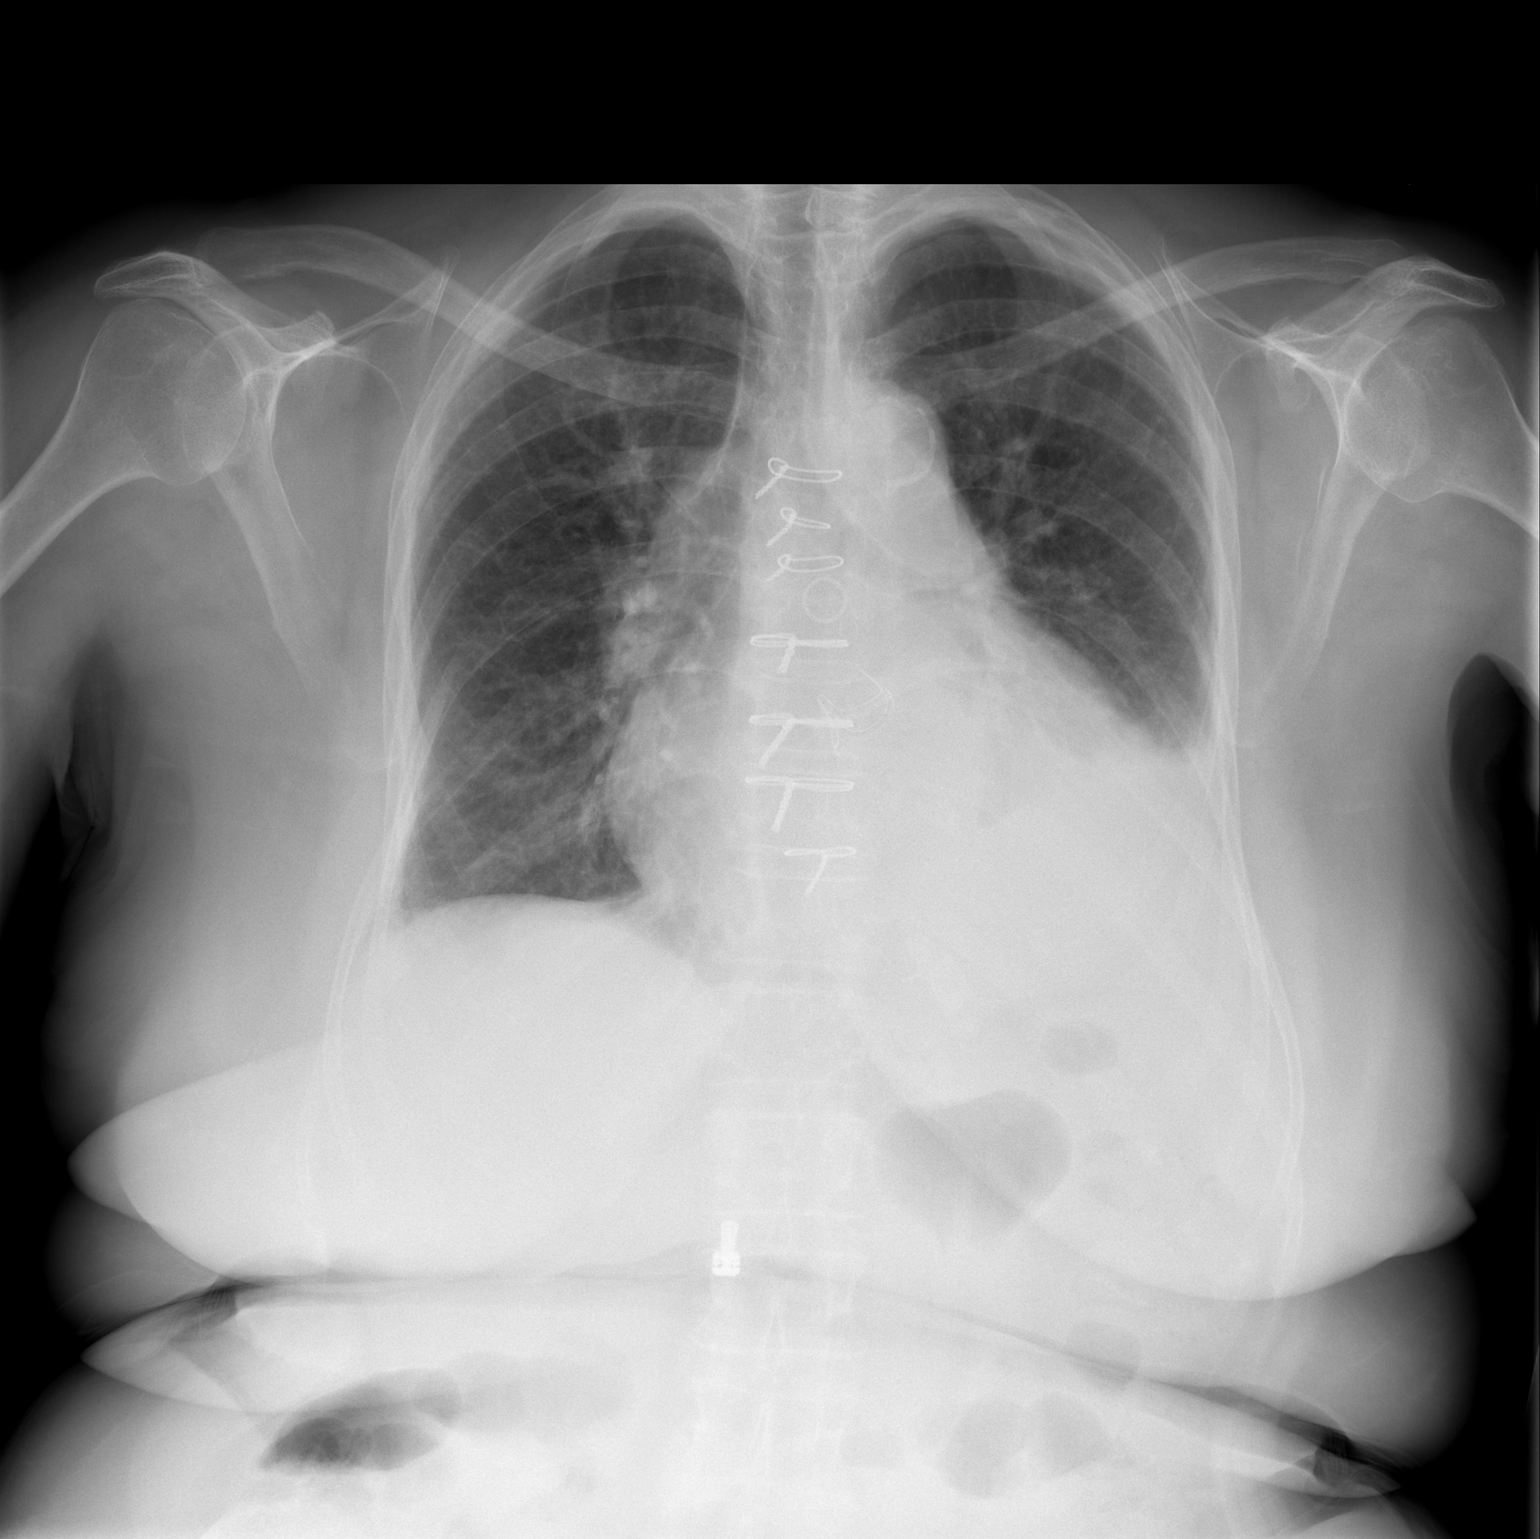

[w chest lat]
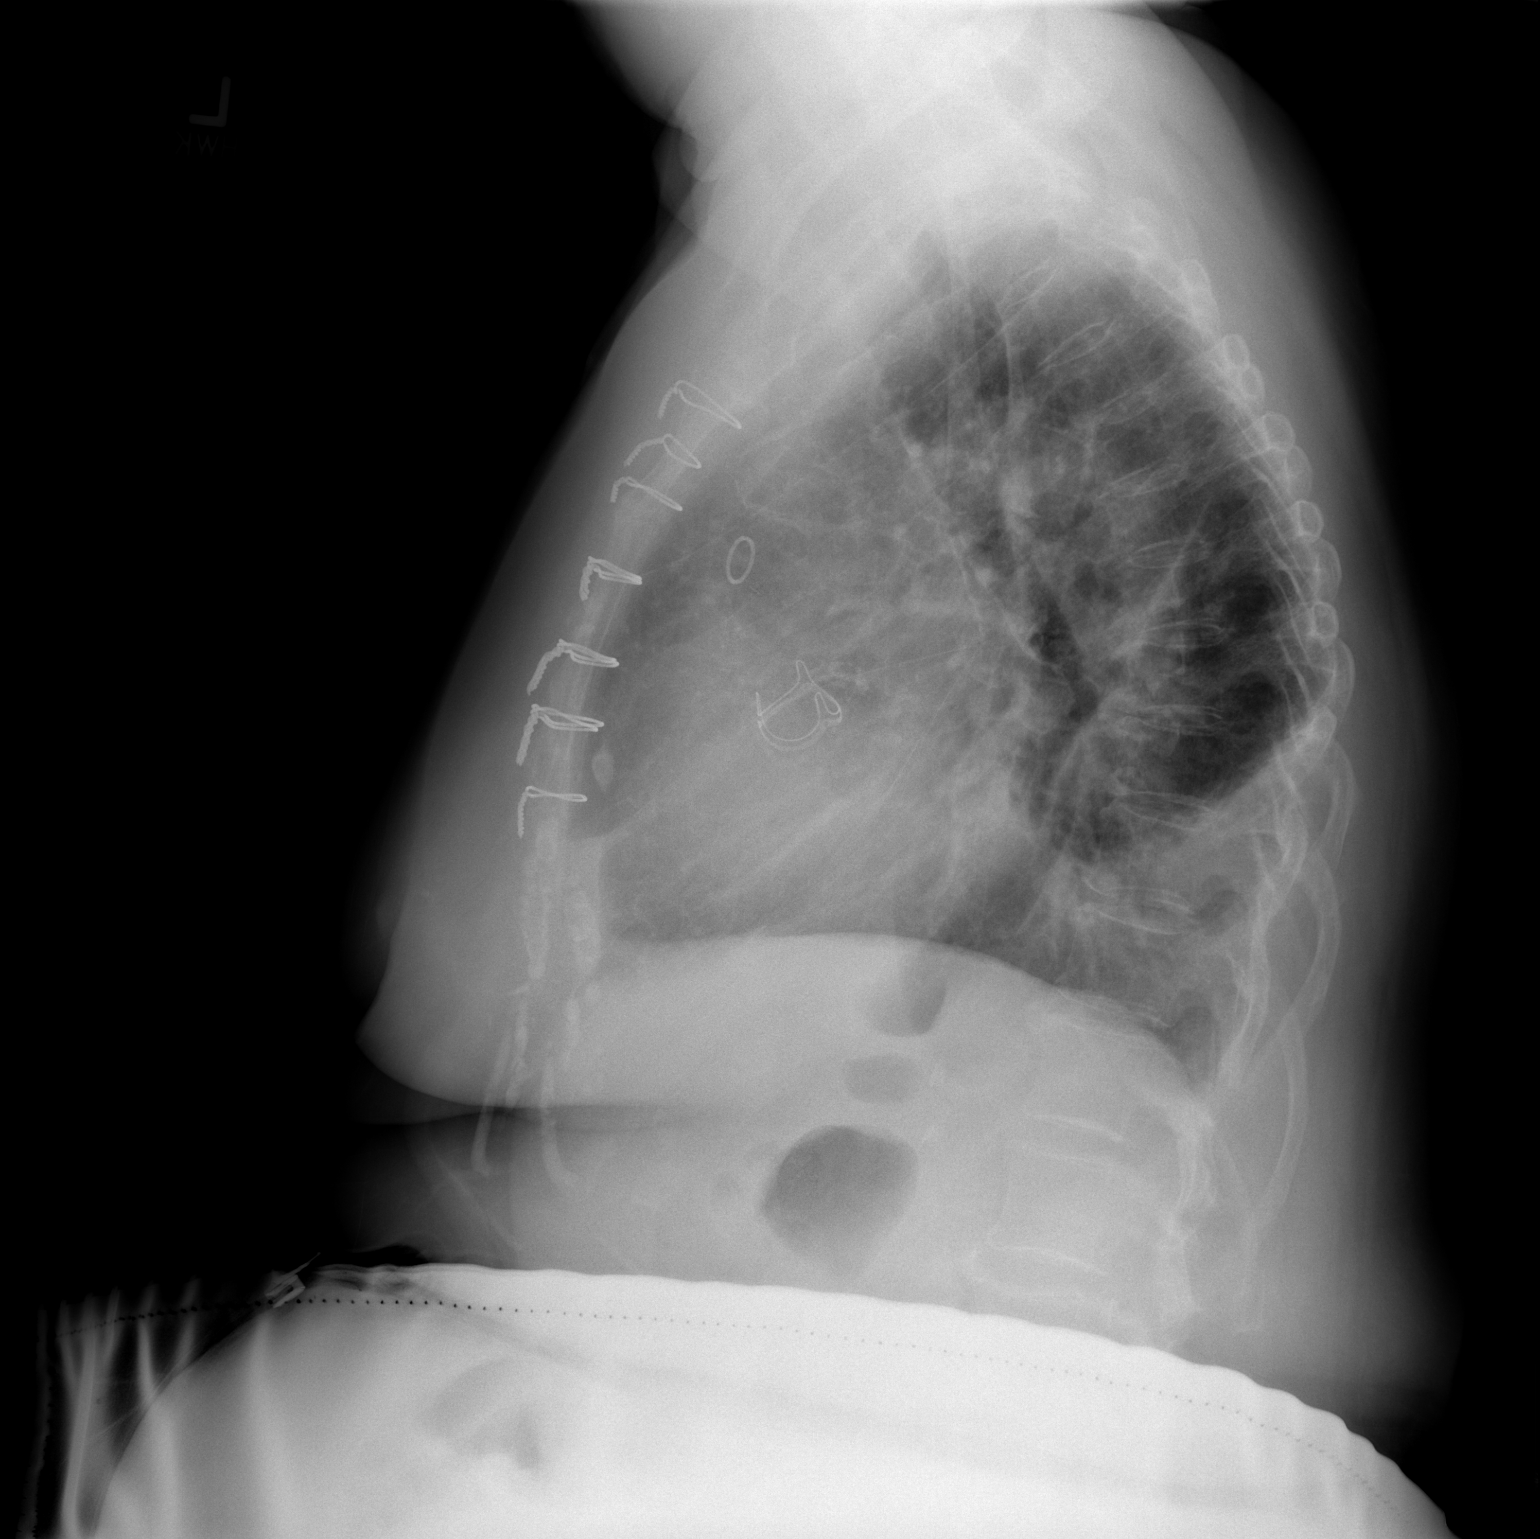

[2 of 2 positions shown; findings below may reference images not displayed]

FINDINGS: Prior CABG and valve replacement.  Mild cardiomegaly.
Left lower lobe atelectasis with small to moderate left pleural
effusion.  These findings have increased slightly since prior
study.  No significant right effusion.  No edema.  No acute bony
abnormality.
IMPRESSION: Slight increase size of the left effusion and left lower lobe
atelectasis.

## 2013-09-24 ENCOUNTER — Encounter (HOSPITAL_COMMUNITY): Payer: Medicare Other

## 2013-09-25 ENCOUNTER — Other Ambulatory Visit (INDEPENDENT_AMBULATORY_CARE_PROVIDER_SITE_OTHER): Payer: Medicare Other

## 2013-09-25 DIAGNOSIS — E039 Hypothyroidism, unspecified: Secondary | ICD-10-CM

## 2013-09-25 DIAGNOSIS — E1165 Type 2 diabetes mellitus with hyperglycemia: Secondary | ICD-10-CM

## 2013-09-25 DIAGNOSIS — IMO0001 Reserved for inherently not codable concepts without codable children: Secondary | ICD-10-CM

## 2013-09-25 LAB — HEMOGLOBIN A1C: HEMOGLOBIN A1C: 7.7 % — AB (ref 4.6–6.5)

## 2013-09-25 LAB — COMPREHENSIVE METABOLIC PANEL
ALK PHOS: 38 U/L — AB (ref 39–117)
ALT: 11 U/L (ref 0–35)
AST: 21 U/L (ref 0–37)
Albumin: 3.9 g/dL (ref 3.5–5.2)
BUN: 21 mg/dL (ref 6–23)
CHLORIDE: 104 meq/L (ref 96–112)
CO2: 28 mEq/L (ref 19–32)
Calcium: 9.5 mg/dL (ref 8.4–10.5)
Creatinine, Ser: 0.9 mg/dL (ref 0.4–1.2)
GFR: 74.89 mL/min (ref >60.00)
Glucose, Bld: 130 mg/dL — ABNORMAL HIGH (ref 70–99)
POTASSIUM: 3.7 meq/L (ref 3.5–5.1)
SODIUM: 138 meq/L (ref 135–145)
Total Bilirubin: 0.5 mg/dL (ref 0.2–1.2)
Total Protein: 7.5 g/dL (ref 6.0–8.3)

## 2013-09-25 LAB — TSH: TSH: 0.56 u[IU]/mL (ref 0.35–4.50)

## 2013-09-25 LAB — T4, FREE: Free T4: 1.01 ng/dL (ref 0.60–1.60)

## 2013-09-26 ENCOUNTER — Encounter (HOSPITAL_COMMUNITY): Payer: Medicare Other

## 2013-09-29 ENCOUNTER — Encounter (HOSPITAL_COMMUNITY): Payer: Medicare Other

## 2013-09-30 ENCOUNTER — Ambulatory Visit (INDEPENDENT_AMBULATORY_CARE_PROVIDER_SITE_OTHER): Payer: Medicare Other | Admitting: Endocrinology

## 2013-09-30 ENCOUNTER — Encounter: Payer: Self-pay | Admitting: Endocrinology

## 2013-09-30 VITALS — BP 124/70 | HR 69 | Temp 98.7°F | Resp 12 | Wt 187.0 lb

## 2013-09-30 DIAGNOSIS — E1165 Type 2 diabetes mellitus with hyperglycemia: Principal | ICD-10-CM

## 2013-09-30 DIAGNOSIS — E039 Hypothyroidism, unspecified: Secondary | ICD-10-CM

## 2013-09-30 DIAGNOSIS — IMO0001 Reserved for inherently not codable concepts without codable children: Secondary | ICD-10-CM

## 2013-09-30 DIAGNOSIS — I10 Essential (primary) hypertension: Secondary | ICD-10-CM

## 2013-09-30 DIAGNOSIS — I2510 Atherosclerotic heart disease of native coronary artery without angina pectoris: Secondary | ICD-10-CM

## 2013-09-30 NOTE — Progress Notes (Signed)
[name] 80 y.o.    Reason for Appointment: Diabetes follow-up   History of Present Illness   Diagnosis: Type 2 DIABETES MELITUS, date of diagnosis:   2000   She has been on insulin since 2004 with difficulty controlling adequately because of compliance with diet and variability in blood sugars. Her previous records are not available at present, however she has been on basal insulin with mealtime coverage since at least 2007 She has been tried on Byetta previously but did not tolerate this well She tried Victoza but didn't not benefit from Victoza 0.6 dosage; had nausea from 1.2 mg; did not continue this because of this side effect  RECENT history:   She did restart her Invokana as discussed on her last visit; she had been reluctant to continue it previously because of frequent urination and some urinary incontinence She has tried only a half of a 100 mg tablet in the morning. She does tolerate it fairly well but still has some increase urination and tendency to incontinence However her blood sugars are appearing significantly better and A1c is improving Fasting readings are more consistent and overall average is 146 compared to 172 previously Has not lost weight with this Mealtime insulin: She thinks she is taking a little less insulin at breakfast and lunch sometimes  Other problems identified:  Still inconsistent with taking her insulin before her lunch and occasionally at supper especially when eating out.  Some of her postprandial readings are still high from inconsistent diet  She is a having periodic snacks in between meals. Some of these may be causing blood sugars as high as 300 at lunchtime  She is not doing much walking lately and her weight has increased   Oral hypoglycemic drugs: Metformin 1500 mg qd      .  INSULIN: Lantus 22 units daily, HUMALOG 6 acb, acl 6-8; 4 acs  3 times a day       Proper timing of medications in relation to meals: not at lunch or supper  at times.          Monitors blood glucose:  3.3 times daily     Glucometer: One Touch.          Blood Glucose readings from meter download:   PREMEAL Breakfast Lunch Dinner  PCS  Overall  Glucose range:  89-167   140-322   84-247   117-271    Mean/median: 119 227   146+/-56   Hypoglycemia: None recently         Meals: 3 meals per day:  lunch 12-2 pm, dinner 5 pm, her supper is smaller  Physical activity: exercise: walks a little    Wt Readings from Last 3 Encounters:  09/30/13 187 lb (84.823 kg)  09/10/13 183 lb 12.8 oz (83.371 kg)  09/04/13 183 lb (83.008 kg)    Lab Results  Component Value Date   HGBA1C 7.7* 09/25/2013   HGBA1C 8.0* 06/02/2013   HGBA1C 7.8* 01/30/2013   Lab Results  Component Value Date   MICROALBUR 8.8* 06/02/2013   LDLCALC 71 06/05/2013   CREATININE 0.9 09/25/2013    Appointment on 09/25/2013  Component Date Value Ref Range Status  . TSH 09/25/2013 0.56  0.35 - 4.50 uIU/mL Final  . Free T4 09/25/2013 1.01  0.60 - 1.60 ng/dL Final  . Hemoglobin A1C 09/25/2013 7.7* 4.6 - 6.5 % Final   Glycemic Control Guidelines for People with Diabetes:Non Diabetic:  <6%Goal of Therapy: <7%Additional Action Suggested:  >8%   .  Sodium 09/25/2013 138  135 - 145 mEq/L Final  . Potassium 09/25/2013 3.7  3.5 - 5.1 mEq/L Final  . Chloride 09/25/2013 104  96 - 112 mEq/L Final  . CO2 09/25/2013 28  19 - 32 mEq/L Final  . Glucose, Bld 09/25/2013 130* 70 - 99 mg/dL Final  . BUN 09/25/2013 21  6 - 23 mg/dL Final  . Creatinine, Ser 09/25/2013 0.9  0.4 - 1.2 mg/dL Final  . Total Bilirubin 09/25/2013 0.5  0.2 - 1.2 mg/dL Final  . Alkaline Phosphatase 09/25/2013 38* 39 - 117 U/L Final  . AST 09/25/2013 21  0 - 37 U/L Final  . ALT 09/25/2013 11  0 - 35 U/L Final  . Total Protein 09/25/2013 7.5  6.0 - 8.3 g/dL Final  . Albumin 09/25/2013 3.9  3.5 - 5.2 g/dL Final  . Calcium 09/25/2013 9.5  8.4 - 10.5 mg/dL Final  . GFR 09/25/2013 74.89  >60.00 mL/min Final       Medication List        This list is accurate as of: 09/30/13 10:03 AM.  Always use your most recent med list.               aspirin EC 81 MG tablet  Take 1 tablet (81 mg total) by mouth daily.     benazepril 20 MG tablet  Commonly known as:  LOTENSIN  Take 1 tablet (20 mg total) by mouth daily.     carvedilol 12.5 MG tablet  Commonly known as:  COREG  TAKE 1 TABLET (12.5 MG TOTAL) BY MOUTH 2 (TWO) TIMES DAILY.     cholecalciferol 1000 UNITS tablet  Commonly known as:  VITAMIN D  Take 1,000 Units by mouth daily.     Co Q-10 100 MG Caps  Take 1 capsule by mouth 2 (two) times daily.     felodipine 10 MG 24 hr tablet  Commonly known as:  PLENDIL  Take 1 tablet (10 mg total) by mouth daily.     hydrochlorothiazide 12.5 MG capsule  Commonly known as:  MICROZIDE  Take 12.5 mg by mouth daily.     Insulin Glargine 100 UNIT/ML Solostar Pen  Commonly known as:  LANTUS SOLOSTAR  Inject 22 Units into the skin daily.     insulin lispro 100 UNIT/ML KiwkPen  Commonly known as:  HUMALOG  Inject 6-8 Units into the skin 2 (two) times daily.     Insulin Pen Needle 31G X 5 MM Misc  Use as directed twice a day     INVOKANA 100 MG Tabs  Generic drug:  Canagliflozin  Take by mouth.     levothyroxine 88 MCG tablet  Commonly known as:  SYNTHROID, LEVOTHROID  Take 1 tablet (88 mcg total) by mouth daily.     metFORMIN 750 MG 24 hr tablet  Commonly known as:  GLUCOPHAGE-XR  Take 1 tablet (750 mg total) by mouth 2 (two) times daily.     multivitamins ther. w/minerals Tabs tablet  Take 1 tablet by mouth daily.     rosuvastatin 20 MG tablet  Commonly known as:  CRESTOR  Take 1 tablet (20 mg total) by mouth every evening.     TUMS 500 MG chewable tablet  Generic drug:  calcium carbonate  Chew 1 tablet by mouth daily as needed for heartburn (indigestion).        Allergies: No Known Allergies  Past Medical History  Diagnosis Date  . Thyroid disease   . Diabetes mellitus   .  Lumbago   . Aortic  valve disorders     S/P AVR 06/2012  . Hyperlipidemia   . Heart murmur   . Hypertension   . Hypothyroidism   . Depression   . Shortness of breath   . Asthma     AS CHILD   . Arthritis     Past Surgical History  Procedure Laterality Date  . Tubal ligation    . Breast surgery      LT BREAST BX BENIGN  . Aortic valve replacement N/A 06/25/2012    Procedure: AORTIC VALVE REPLACEMENT (AVR);  Surgeon: Grace Isaac, MD;  Location: [facility];  Service: Open Heart Surgery;  Laterality: N/A;  . Coronary artery bypass graft N/A 06/25/2012    Procedure: Coronary artery bypass graft  times two using left internal mammary artery and right leg greater saphenous vein;  Surgeon: Grace Isaac, MD;  Location: [facility];  Service: Open Heart Surgery;  Laterality: N/A;  . Intraoperative transesophageal echocardiogram N/A 06/25/2012    Procedure: INTRAOPERATIVE TRANSESOPHAGEAL ECHOCARDIOGRAM;  Surgeon: Grace Isaac, MD;  Location: [facility];  Service: Open Heart Surgery;  Laterality: N/A;    Family History  Problem Relation Age of Onset  . Heart disease Father     Social History:  reports that she has never smoked. She does not have any smokeless tobacco history on file. She reports that she does not drink alcohol or use illicit drugs.  Review of Systems:  HYPERTENSION: taking multiple drugs and now well  controlled. No lightheadedness  HYPOTHYROIDISM: She has had long-standing hypothyroidism  Her dosage has been changed periodically. She had a low TSH on her previous visit and Synthroid reduced slightly Her TSH is back to normal now, has not subjectively felt any different  Lab Results  Component Value Date   TSH 0.56 09/25/2013    HYPERLIPIDEMIA: The lipid abnormality consists of elevated LDL; has been variably controlled with Crestor. Being managed by PCP. Recent LDL done by cardiologist  Lab Results  Component Value Date   CHOL 129 06/05/2013   HDL 48.30 06/05/2013   LDLCALC 71 06/05/2013    TRIG 50.0 06/05/2013   CHOLHDL 3 06/05/2013  '   No recent edema  Saw podiatrist in 5/15, no new problems   Examination:   BP 124/70  Pulse 69  Temp(Src) 98.7 F (37.1 C) (Oral)  Resp 12  Wt 187 lb (84.823 kg)  SpO2 96%  Body mass index is 33.13 kg/(m^2).   No ankle edema  ASSESSMENT/ PLAN:   Diabetes type 2, uncontrolled   The patient's diabetes has been somewhat better with adding Invokana Also A1c is slightly better Even though she is having mild difficulty with urination and incontinence with Invokana encouraged her to continue this because of the significant benefit she is getting compared to previous level of control She is still not able to manage her diet and mealtime insulin coverage adequately as discussed in history of present illness Fasting readings are consistent and mostly near normal with current regimen of Lantus She has difficulty losing weight and controlling snacks also; previously did not benefit from low-dose Victoza and could not tolerate 1.2 mg She agrees to restart Invokana and today's recommendations are:   She will  adjust Lantus only if fasting blood sugars are not within target of 90-130  Take both Metformin in ams  More regular walking  Better compliance with lunchtime and suppertime insulin  May take 2-3 units more insulin when eating  out  Hypertension: Fairly well controlled without HCTZ  Hypothyroidism: TSH back to normal Continue Synthroid to 88ug daily To have labs checked again in 6 months  Counseling time  today over 50% of today's 25 minute visit   Elayne Snare 09/30/2013, 10:03 AM

## 2013-09-30 NOTE — Patient Instructions (Signed)
Make sure and take insulin for every meal and large snack  Walk daily

## 2013-10-01 ENCOUNTER — Encounter (HOSPITAL_COMMUNITY): Payer: Medicare Other

## 2013-10-03 ENCOUNTER — Encounter (HOSPITAL_COMMUNITY): Payer: Medicare Other

## 2013-10-06 ENCOUNTER — Encounter (HOSPITAL_COMMUNITY): Payer: Medicare Other

## 2013-10-08 ENCOUNTER — Encounter (HOSPITAL_COMMUNITY): Payer: Medicare Other

## 2013-10-10 ENCOUNTER — Encounter (HOSPITAL_COMMUNITY): Payer: Medicare Other

## 2013-10-13 ENCOUNTER — Encounter (HOSPITAL_COMMUNITY): Payer: Medicare Other

## 2013-10-15 ENCOUNTER — Encounter (HOSPITAL_COMMUNITY): Payer: Medicare Other

## 2013-10-17 ENCOUNTER — Encounter (HOSPITAL_COMMUNITY): Payer: Medicare Other

## 2013-10-20 ENCOUNTER — Encounter (HOSPITAL_COMMUNITY): Payer: Medicare Other

## 2013-10-22 ENCOUNTER — Encounter (HOSPITAL_COMMUNITY): Payer: Medicare Other

## 2013-10-24 ENCOUNTER — Encounter (HOSPITAL_COMMUNITY): Payer: Medicare Other

## 2013-10-27 ENCOUNTER — Encounter (HOSPITAL_COMMUNITY): Payer: Medicare Other

## 2013-10-29 ENCOUNTER — Encounter (HOSPITAL_COMMUNITY): Payer: Medicare Other

## 2013-10-31 ENCOUNTER — Encounter (HOSPITAL_COMMUNITY): Payer: Medicare Other

## 2013-11-03 ENCOUNTER — Encounter (HOSPITAL_COMMUNITY): Payer: Medicare Other

## 2013-11-05 ENCOUNTER — Encounter (HOSPITAL_COMMUNITY): Payer: Medicare Other

## 2013-11-07 ENCOUNTER — Encounter (HOSPITAL_COMMUNITY): Payer: Medicare Other

## 2013-11-10 ENCOUNTER — Encounter (HOSPITAL_COMMUNITY): Payer: Medicare Other

## 2013-11-12 ENCOUNTER — Encounter (HOSPITAL_COMMUNITY): Payer: Medicare Other

## 2013-11-14 ENCOUNTER — Encounter (HOSPITAL_COMMUNITY): Payer: Medicare Other

## 2013-11-17 ENCOUNTER — Encounter (HOSPITAL_COMMUNITY): Payer: Medicare Other

## 2013-11-19 ENCOUNTER — Encounter (HOSPITAL_COMMUNITY): Payer: Medicare Other

## 2013-11-21 ENCOUNTER — Encounter (HOSPITAL_COMMUNITY): Payer: Medicare Other

## 2013-11-24 ENCOUNTER — Encounter (HOSPITAL_COMMUNITY): Payer: Medicare Other

## 2013-11-26 ENCOUNTER — Encounter (HOSPITAL_COMMUNITY): Payer: Medicare Other

## 2013-11-26 ENCOUNTER — Telehealth: Payer: Self-pay | Admitting: Endocrinology

## 2013-11-26 NOTE — Telephone Encounter (Signed)
Patient is returning your call.  

## 2013-11-28 ENCOUNTER — Encounter (HOSPITAL_COMMUNITY): Payer: Medicare Other

## 2013-11-28 ENCOUNTER — Other Ambulatory Visit (INDEPENDENT_AMBULATORY_CARE_PROVIDER_SITE_OTHER): Payer: Medicare Other

## 2013-11-28 DIAGNOSIS — IMO0001 Reserved for inherently not codable concepts without codable children: Secondary | ICD-10-CM

## 2013-11-28 DIAGNOSIS — E1165 Type 2 diabetes mellitus with hyperglycemia: Principal | ICD-10-CM

## 2013-11-28 LAB — LIPID PANEL
Cholesterol: 168 mg/dL (ref 0–200)
HDL: 46.7 mg/dL (ref >39.00)
LDL Cholesterol: 99 mg/dL (ref 0–99)
NONHDL: 121.3
Total CHOL/HDL Ratio: 4
Triglycerides: 112 mg/dL (ref 0.0–149.0)
VLDL: 22.4 mg/dL (ref 0.0–40.0)

## 2013-11-28 LAB — BASIC METABOLIC PANEL
BUN: 15 mg/dL (ref 6–23)
CO2: 31 mEq/L (ref 19–32)
CREATININE: 0.9 mg/dL (ref 0.4–1.2)
Calcium: 10 mg/dL (ref 8.4–10.5)
Chloride: 100 mEq/L (ref 96–112)
GFR: 78.75 mL/min (ref >60.00)
GLUCOSE: 181 mg/dL — AB (ref 70–99)
Potassium: 3.9 mEq/L (ref 3.5–5.1)
Sodium: 136 mEq/L (ref 135–145)

## 2013-11-28 LAB — LDL CHOLESTEROL, DIRECT: LDL DIRECT: 102.8 mg/dL

## 2013-12-01 LAB — FRUCTOSAMINE: Fructosamine: 342 umol/L — ABNORMAL HIGH (ref 190–270)

## 2013-12-02 ENCOUNTER — Ambulatory Visit (INDEPENDENT_AMBULATORY_CARE_PROVIDER_SITE_OTHER): Payer: Medicare Other | Admitting: Endocrinology

## 2013-12-02 ENCOUNTER — Other Ambulatory Visit: Payer: Self-pay | Admitting: *Deleted

## 2013-12-02 ENCOUNTER — Encounter: Payer: Self-pay | Admitting: Endocrinology

## 2013-12-02 VITALS — BP 123/67 | HR 72 | Temp 98.0°F | Resp 16 | Ht 63.0 in | Wt 185.0 lb

## 2013-12-02 DIAGNOSIS — E1165 Type 2 diabetes mellitus with hyperglycemia: Principal | ICD-10-CM

## 2013-12-02 DIAGNOSIS — I2510 Atherosclerotic heart disease of native coronary artery without angina pectoris: Secondary | ICD-10-CM

## 2013-12-02 DIAGNOSIS — E039 Hypothyroidism, unspecified: Secondary | ICD-10-CM

## 2013-12-02 DIAGNOSIS — R5383 Other fatigue: Secondary | ICD-10-CM

## 2013-12-02 DIAGNOSIS — I10 Essential (primary) hypertension: Secondary | ICD-10-CM

## 2013-12-02 DIAGNOSIS — IMO0001 Reserved for inherently not codable concepts without codable children: Secondary | ICD-10-CM

## 2013-12-02 DIAGNOSIS — R5381 Other malaise: Secondary | ICD-10-CM

## 2013-12-02 MED ORDER — INSULIN LISPRO 100 UNIT/ML (KWIKPEN): 6.0000 [IU] | PEN_INJECTOR | Freq: Two times a day (BID) | SUBCUTANEOUS | Status: DC

## 2013-12-02 MED ORDER — CANAGLIFLOZIN-METFORMIN HCL 50-1000 MG PO TABS: ORAL_TABLET | ORAL | Status: DC

## 2013-12-02 MED ORDER — FLUCONAZOLE 150 MG PO TABS: 150.0000 mg | ORAL_TABLET | Freq: Once | ORAL | Status: DC

## 2013-12-02 NOTE — Patient Instructions (Addendum)
8 Humalog in am, take pen with you when eating out Invokamet 50/1000 in am and metformin in pm  Fluconazole for yeast infection as needed  Leave off HCTZ

## 2013-12-02 NOTE — Progress Notes (Signed)
[name] 80 y.o.   Reason for Appointment: Diabetes follow-up   History of Present Illness   Diagnosis: Type 2 DIABETES MELITUS, date of diagnosis:   2000   She has been on insulin since 2004 with difficulty controlling adequately because of compliance with diet and variability in blood sugars. Her previous records are not available at present, however she has been on basal insulin with mealtime coverage since at least 2007 She has been tried on Byetta previously but did not tolerate this well She tried Victoza but didn't not benefit from Victoza 0.6 dosage; had nausea from 1.2 mg; did not continue this because of this side effect  RECENT history:   Her blood sugars are again difficult to control with postprandial hyperglycemia with the following problems:  She continues to have noncompliance with taking enough insulin and also not taking coverage for her lunch meal frequently  Some of her postprandial readings are still high from inconsistent diet with more carbohydrate for snacks  She again stopped her Invokana because of an episode of vaginal candidiasis, was taking only 50 mg previously  Blood sugars are the highest before lunch and frequently after lunch also  Not checking enough readings after supper However her blood sugars are appearing slightly higher and fructosamine is still high Previously overall average at home was 146 and now 175 Other problems identified:  Still inconsistent with taking her insulin before her lunch and occasionally at supper especially when eating out.  She is a having periodic snacks in between meals. Some of these may be causing blood sugars as high as 300 at lunchtime  She is not doing much walking lately and her weight has increased   Oral hypoglycemic drugs: Metformin 1500 mg qd      .  INSULIN: Lantus 22 units daily, HUMALOG 6 acb, acl 6-8; 4 acs  3 times a day       Proper timing of medications in relation to meals: not at lunch or  supper at times.          Monitors blood glucose:  3.3 times daily     Glucometer: One Touch.          Blood Glucose readings from meter download:   PREMEAL Breakfast Lunch Dinner Bedtime Overall  Glucose range:  84-229   118-294   141-230   134   84-294   Mean/median:  131   192     175    Hypoglycemia: None recently         Meals: 3 meals per day:  lunch 12-2 pm, dinner 5 pm, her supper is smaller  Physical activity: exercise: walks a little    Wt Readings from Last 3 Encounters:  12/02/13 185 lb (83.915 kg)  09/30/13 187 lb (84.823 kg)  09/10/13 183 lb 12.8 oz (83.371 kg)    Lab Results  Component Value Date   HGBA1C 7.7* 09/25/2013   HGBA1C 8.0* 06/02/2013   HGBA1C 7.8* 01/30/2013   Lab Results  Component Value Date   MICROALBUR 8.8* 06/02/2013   LDLCALC 99 11/28/2013   CREATININE 0.9 11/28/2013    Appointment on 11/28/2013  Component Date Value Ref Range Status  . Sodium 11/28/2013 136  135 - 145 mEq/L Final  . Potassium 11/28/2013 3.9  3.5 - 5.1 mEq/L Final  . Chloride 11/28/2013 100  96 - 112 mEq/L Final  . CO2 11/28/2013 31  19 - 32 mEq/L Final  . Glucose, Bld 11/28/2013 181* 70 -  99 mg/dL Final  . BUN 11/28/2013 15  6 - 23 mg/dL Final  . Creatinine, Ser 11/28/2013 0.9  0.4 - 1.2 mg/dL Final  . Calcium 11/28/2013 10.0  8.4 - 10.5 mg/dL Final  . GFR 11/28/2013 78.75  >60.00 mL/min Final  . Fructosamine 11/28/2013 342* 190 - 270 umol/L Final  . Cholesterol 11/28/2013 168  0 - 200 mg/dL Final   ATP III Classification       Desirable:  < 200 mg/dL               Borderline High:  200 - 239 mg/dL          High:  > = 240 mg/dL  . Triglycerides 11/28/2013 112.0  0.0 - 149.0 mg/dL Final   Normal:  <150 mg/dLBorderline High:  150 - 199 mg/dL  . HDL 11/28/2013 46.70  >39.00 mg/dL Final  . VLDL 11/28/2013 22.4  0.0 - 40.0 mg/dL Final  . LDL Cholesterol 11/28/2013 99  0 - 99 mg/dL Final  . Total CHOL/HDL Ratio 11/28/2013 4   Final                  Men          Women1/2 Average  Risk     3.4          3.3Average Risk          5.0          4.42X Average Risk          9.6          7.13X Average Risk          15.0          11.0                      . NonHDL 11/28/2013 121.30   Final   NOTE:  Non-HDL goal should be 30 mg/dL higher than patient's LDL goal (i.e. LDL goal of < 70 mg/dL, would have non-HDL goal of < 100 mg/dL)  . Direct LDL 11/28/2013 102.8   Final   Optimal:  <100 mg/dLNear or Above Optimal:  100-129 mg/dLBorderline High:  130-159 mg/dLHigh:  160-189 mg/dLVery High:  >190 mg/dL       Medication List       This list is accurate as of: 12/02/13  9:09 AM.  Always use your most recent med list.               aspirin EC 81 MG tablet  Take 1 tablet (81 mg total) by mouth daily.     benazepril 20 MG tablet  Commonly known as:  LOTENSIN  Take 1 tablet (20 mg total) by mouth daily.     carvedilol 12.5 MG tablet  Commonly known as:  COREG  TAKE 1 TABLET (12.5 MG TOTAL) BY MOUTH 2 (TWO) TIMES DAILY.     cholecalciferol 1000 UNITS tablet  Commonly known as:  VITAMIN D  Take 1,000 Units by mouth daily.     Co Q-10 100 MG Caps  Take 1 capsule by mouth 2 (two) times daily.     felodipine 10 MG 24 hr tablet  Commonly known as:  PLENDIL  Take 1 tablet (10 mg total) by mouth daily.     hydrochlorothiazide 12.5 MG capsule  Commonly known as:  MICROZIDE  Take 12.5 mg by mouth daily.     Insulin Glargine 100 UNIT/ML Solostar Pen  Commonly known as:  LANTUS SOLOSTAR  Inject 22 Units into the skin daily.     insulin lispro 100 UNIT/ML KiwkPen  Commonly known as:  HUMALOG  Inject 6-8 Units into the skin 2 (two) times daily.     Insulin Pen Needle 31G X 5 MM Misc  Use as directed twice a day     INVOKANA 100 MG Tabs  Generic drug:  Canagliflozin  Take by mouth.     levothyroxine 88 MCG tablet  Commonly known as:  SYNTHROID, LEVOTHROID  Take 1 tablet (88 mcg total) by mouth daily.     metFORMIN 750 MG 24 hr tablet  Commonly known as:   GLUCOPHAGE-XR  Take 1 tablet (750 mg total) by mouth 2 (two) times daily.     multivitamins ther. w/minerals Tabs tablet  Take 1 tablet by mouth daily.     rosuvastatin 20 MG tablet  Commonly known as:  CRESTOR  Take 1 tablet (20 mg total) by mouth every evening.     TUMS 500 MG chewable tablet  Generic drug:  calcium carbonate  Chew 1 tablet by mouth daily as needed for heartburn (indigestion).        Allergies: No Known Allergies  Past Medical History  Diagnosis Date  . Thyroid disease   . Diabetes mellitus   . Lumbago   . Aortic valve disorders     S/P AVR 06/2012  . Hyperlipidemia   . Heart murmur   . Hypertension   . Hypothyroidism   . Depression   . Shortness of breath   . Asthma     AS CHILD   . Arthritis     Past Surgical History  Procedure Laterality Date  . Tubal ligation    . Breast surgery      LT BREAST BX BENIGN  . Aortic valve replacement N/A 06/25/2012    Procedure: AORTIC VALVE REPLACEMENT (AVR);  Surgeon: Grace Isaac, MD;  Location: [facility];  Service: Open Heart Surgery;  Laterality: N/A;  . Coronary artery bypass graft N/A 06/25/2012    Procedure: Coronary artery bypass graft  times two using left internal mammary artery and right leg greater saphenous vein;  Surgeon: Grace Isaac, MD;  Location: [facility];  Service: Open Heart Surgery;  Laterality: N/A;  . Intraoperative transesophageal echocardiogram N/A 06/25/2012    Procedure: INTRAOPERATIVE TRANSESOPHAGEAL ECHOCARDIOGRAM;  Surgeon: Grace Isaac, MD;  Location: [facility];  Service: Open Heart Surgery;  Laterality: N/A;    Family History  Problem Relation Age of Onset  . Heart disease Father     Social History:  reports that she has never smoked. She does not have any smokeless tobacco history on file. She reports that she does not drink alcohol or use illicit drugs.  Review of Systems:   She still having tiredness and some insomnia  HYPERTENSION: taking multiple drugs and now well   controlled. No lightheadedness  HYPOTHYROIDISM: She has had long-standing hypothyroidism  Her dosage has been changed periodically. She had a low TSH on a  previous visit and Synthroid reduced slightly Her TSH is back to normal    Lab Results  Component Value Date   TSH 0.56 09/25/2013    HYPERLIPIDEMIA: The lipid abnormality consists of elevated LDL; has been variably controlled with Crestor. Being managed by PCP. Recent LDL  borderline    Lab Results  Component Value Date   CHOL 168 11/28/2013   HDL 46.70 11/28/2013   LDLCALC 99 11/28/2013   LDLDIRECT 102.8  11/28/2013   TRIG 112.0 11/28/2013   CHOLHDL 4 11/28/2013  '   No recent edema  Saw podiatrist in 5/15, no new problems   Examination:   BP 123/67  Pulse 72  Temp(Src) 98 F (36.7 C)  Resp 16  Ht 5\' 3"  (1.6 m)  Wt 185 lb (83.915 kg)  BMI 32.78 kg/m2  SpO2 97%  Body mass index is 32.78 kg/(m^2).   No ankle edema  ASSESSMENT/ PLAN:   Diabetes type 2, uncontrolled   The patient's diabetes has been somewhat  higher with stopping her Invokana even though it was a low dose Fructosamine is higher and her home average blood sugars are also high Her blood sugar patterns are detailed in the history of present illness She has significant postprandial hyperglycemia  with adding Invokana She is still not able to manage her diet and mealtime insulin coverage adequately especially taking it at lunch and not taking enough at breakfast  Fasting readings are consistent and  averaging about 130 with current regimen of Lantus She has difficulty losing weight and controlling snacks also; previously did not benefit from low-dose Victoza and could not tolerate 1.2 mg She agrees to restart Invokana  in combination with metformin which will be easier to dose to half tablet and today's recommendations are:   She will  adjust Lantus only if fasting blood sugars  are not improving   Take  the  Metformin  1 tablet in the evening and use  50/1000 Invokamet in the morning   More regular walking  Better compliance with lunchtime and suppertime insulin  May take 2-3 units more insulin when eating out   consider V - go pump but she is reluctant to do this now  Hypertension: Fairly well controlled  and she will stop her HCTZ with Invokana   Counseling time  today over 50% of today's 25 minute visit   Charleston Hankin 12/02/2013, 9:09 AM

## 2013-12-03 ENCOUNTER — Telehealth: Payer: Self-pay | Admitting: Endocrinology

## 2013-12-03 NOTE — Telephone Encounter (Signed)
Patient called and wanted to be transferred to [facility] I had advised patient that it would be quicker to send note She would like to speak with you  Thank You

## 2013-12-04 ENCOUNTER — Ambulatory Visit (INDEPENDENT_AMBULATORY_CARE_PROVIDER_SITE_OTHER): Payer: Medicare Other | Admitting: Podiatrist

## 2013-12-04 DIAGNOSIS — M79609 Pain in unspecified limb: Secondary | ICD-10-CM

## 2013-12-04 DIAGNOSIS — E1159 Type 2 diabetes mellitus with other circulatory complications: Secondary | ICD-10-CM

## 2013-12-04 DIAGNOSIS — M79673 Pain in unspecified foot: Secondary | ICD-10-CM

## 2013-12-04 DIAGNOSIS — B351 Tinea unguium: Secondary | ICD-10-CM

## 2013-12-04 NOTE — Progress Notes (Signed)
HPI: Patient presents today for follow up of diabetic foot and nail care. Past medical history, meds, and allergies reviewed. Patient states blood sugar is under good control. She denies numbness or tingling.  Objective: Objective: Patients chart is reviewed. Vascular status reveals pedal pulses noted at 2 out of 4 dp and pt bilateral . Neurological sensation is Normal to Semmes Weinstein monofilament bilateral at 5/5 sites bilateral. Dermatological exam reveals absence of pre ulcerative/ hyperkeratotic lesions. Toenails are elongated, incurvated, discolored, dystrophic with ingrown deformity present. Hammertoe right 2nd digit is present   Assessment: Diabetes , hammertoe, painful mycotic nails   Plan: Discussed treatment options and alternatives. Debrided nails without complication. eturn appointment recommended at routine intervals of 3 months    

## 2014-01-09 ENCOUNTER — Other Ambulatory Visit (INDEPENDENT_AMBULATORY_CARE_PROVIDER_SITE_OTHER): Payer: Medicare Other

## 2014-01-09 DIAGNOSIS — IMO0001 Reserved for inherently not codable concepts without codable children: Secondary | ICD-10-CM

## 2014-01-09 DIAGNOSIS — E039 Hypothyroidism, unspecified: Secondary | ICD-10-CM

## 2014-01-09 DIAGNOSIS — E1165 Type 2 diabetes mellitus with hyperglycemia: Principal | ICD-10-CM

## 2014-01-09 LAB — COMPREHENSIVE METABOLIC PANEL
ALK PHOS: 34 U/L — AB (ref 39–117)
ALT: 11 U/L (ref 0–35)
AST: 20 U/L (ref 0–37)
Albumin: 3.8 g/dL (ref 3.5–5.2)
BILIRUBIN TOTAL: 0.4 mg/dL (ref 0.2–1.2)
BUN: 18 mg/dL (ref 6–23)
CALCIUM: 9.6 mg/dL (ref 8.4–10.5)
CO2: 29 mEq/L (ref 19–32)
Chloride: 103 mEq/L (ref 96–112)
Creatinine, Ser: 0.9 mg/dL (ref 0.4–1.2)
GFR: 76.73 mL/min (ref >60.00)
Glucose, Bld: 97 mg/dL (ref 70–99)
Potassium: 3.7 mEq/L (ref 3.5–5.1)
Sodium: 139 mEq/L (ref 135–145)
Total Protein: 7.8 g/dL (ref 6.0–8.3)

## 2014-01-09 LAB — HEMOGLOBIN A1C: Hgb A1c MFr Bld: 7.5 % — ABNORMAL HIGH (ref 4.6–6.5)

## 2014-01-09 LAB — TSH: TSH: 0.22 u[IU]/mL — ABNORMAL LOW (ref 0.35–4.50)

## 2014-01-12 ENCOUNTER — Other Ambulatory Visit: Payer: Self-pay | Admitting: Endocrinology

## 2014-01-13 ENCOUNTER — Encounter: Payer: Self-pay | Admitting: Endocrinology

## 2014-01-13 ENCOUNTER — Ambulatory Visit (INDEPENDENT_AMBULATORY_CARE_PROVIDER_SITE_OTHER): Payer: Medicare Other | Admitting: Endocrinology

## 2014-01-13 ENCOUNTER — Other Ambulatory Visit: Payer: Self-pay | Admitting: *Deleted

## 2014-01-13 VITALS — BP 105/55 | HR 64 | Temp 98.2°F | Resp 16 | Ht 63.0 in | Wt 185.8 lb

## 2014-01-13 DIAGNOSIS — Z23 Encounter for immunization: Secondary | ICD-10-CM

## 2014-01-13 DIAGNOSIS — I952 Hypotension due to drugs: Secondary | ICD-10-CM

## 2014-01-13 DIAGNOSIS — IMO0001 Reserved for inherently not codable concepts without codable children: Secondary | ICD-10-CM

## 2014-01-13 DIAGNOSIS — I9589 Other hypotension: Secondary | ICD-10-CM

## 2014-01-13 DIAGNOSIS — I2510 Atherosclerotic heart disease of native coronary artery without angina pectoris: Secondary | ICD-10-CM

## 2014-01-13 DIAGNOSIS — T50904A Poisoning by unspecified drugs, medicaments and biological substances, undetermined, initial encounter: Secondary | ICD-10-CM

## 2014-01-13 DIAGNOSIS — E039 Hypothyroidism, unspecified: Secondary | ICD-10-CM

## 2014-01-13 DIAGNOSIS — E1165 Type 2 diabetes mellitus with hyperglycemia: Principal | ICD-10-CM

## 2014-01-13 MED ORDER — LEVOTHYROXINE SODIUM 75 MCG PO TABS: 75.0000 ug | ORAL_TABLET | Freq: Every day | ORAL | Status: DC

## 2014-01-13 NOTE — Patient Instructions (Addendum)
Lantus 20 units in am daily and adjust if am sugar in am < 80 or > 130  Take 2-4 units at lunch and supper if eating any Starchy foods or Grease  Reduce Benazepril in 1/2

## 2014-01-13 NOTE — Progress Notes (Signed)
[name] 80 y.o.   Reason for Appointment: Diabetes follow-up   History of Present Illness   Diagnosis: Type 2 DIABETES MELITUS, date of diagnosis:   2000   She has been on insulin since 2004 with difficulty controlling adequately because of compliance with diet and variability in blood sugars. Her previous records are not available at present, however she has been on basal insulin with mealtime coverage since at least 2007 She has been tried on [place] previously but did not tolerate this well She tried Victoza but didn't not benefit from Victoza 0.6 dosage; had nausea from 1.2 mg; did not continue this because of this side effect  RECENT history:   On her last visit she was asked to try Invokamet low-dose along with metformin in the evening Previously had stopped Invokana because of an episode of candidiasis but is tolerating it now and has no other side effects Also she said that she has not had much of an appetite Her blood sugars are again difficult to control with  with the following problems:  She has recently stopped taking her Humalog at lunch and supper for fear of hypoglycemia  She has not been checking her blood sugars much after lunch but previously have been somewhat variable; however not as high as before  She is eating more salads at lunch and this may be helping her sugars in the afternoon  She does have a few readings over 200 in the evenings after supper and this may be from not taking her suppertime dose of Humalog; again she is not taking any HUMALOG at supper because of fear of hypoglycemia overnight  She did have a glucose of 66 on morning but has not had any other hypoglycemia recently. Did not reduce her Lantus with blood sugars in the mornings periodically under 90  She has had sporadic high readings after breakfast but less recently. She continues to take Humalog at breakfast without hypoglycemia She appears to have a better A1c this time although still  over 7%  Oral hypoglycemic drugs: Metformin 750 mg at supper, Invokamet 50/1000 in AM    .  INSULIN: Lantus 22 units daily, HUMALOG 6 acb, none at lunch or supper recently     Proper timing of medications in relation to meals: not at lunch or supper at times.          Monitors blood glucose:  3.3 times daily     Glucometer: One Touch.          Blood Glucose readings from meter download:   PREMEAL Breakfast Lunch Dinner Bedtime Overall  Glucose range: 66-153  94-210   145-234   91, 113    Mean/median:  104      150    POST-MEAL PC Breakfast PC Lunch PC Dinner  Glucose range:   139-258   149-251   Mean/median:   185   190     Hypoglycemia: Minimal with one low reading fasting  Meals: 3 meals per day:  lunch 12-2 pm, dinner 5 pm Physical activity: exercise: walks a little    Wt Readings from Last 3 Encounters:  01/13/14 185 lb 12.8 oz (84.278 kg)  12/02/13 185 lb (83.915 kg)  09/30/13 187 lb (84.823 kg)    Lab Results  Component Value Date   HGBA1C 7.5* 01/09/2014   HGBA1C 7.7* 09/25/2013   HGBA1C 8.0* 06/02/2013   Lab Results  Component Value Date   MICROALBUR 8.8* 06/02/2013   [place] 99 11/28/2013  CREATININE 0.9 01/09/2014    Appointment on 01/09/2014  Component Date Value Ref Range Status  . Hemoglobin A1C 01/09/2014 7.5* 4.6 - 6.5 % Final   Glycemic Control Guidelines for People with Diabetes:Non Diabetic:  <6%Goal of Therapy: <7%Additional Action Suggested:  >8%   . Sodium 01/09/2014 139  135 - 145 mEq/L Final  . Potassium 01/09/2014 3.7  3.5 - 5.1 mEq/L Final  . Chloride 01/09/2014 103  96 - 112 mEq/L Final  . CO2 01/09/2014 29  19 - 32 mEq/L Final  . Glucose, Bld 01/09/2014 97  70 - 99 mg/dL Final  . BUN 01/09/2014 18  6 - 23 mg/dL Final  . Creatinine, Ser 01/09/2014 0.9  0.4 - 1.2 mg/dL Final  . Total Bilirubin 01/09/2014 0.4  0.2 - 1.2 mg/dL Final  . Alkaline Phosphatase 01/09/2014 34* 39 - 117 U/L Final  . AST 01/09/2014 20  0 - 37 U/L Final  . ALT 01/09/2014  11  0 - 35 U/L Final  . Total Protein 01/09/2014 7.8  6.0 - 8.3 g/dL Final  . Albumin 01/09/2014 3.8  3.5 - 5.2 g/dL Final  . Calcium 01/09/2014 9.6  8.4 - 10.5 mg/dL Final  . GFR 01/09/2014 76.73  >60.00 mL/min Final  . TSH 01/09/2014 0.22* 0.35 - 4.50 uIU/mL Final       Medication List       This list is accurate as of: 01/13/14  9:11 AM.  Always use your most recent med list.               aspirin EC 81 MG tablet  Take 1 tablet (81 mg total) by mouth daily.     benazepril 20 MG tablet  Commonly known as:  LOTENSIN  Take 1 tablet (20 mg total) by mouth daily.     Canagliflozin-Metformin HCl 50-1000 MG Tabs  Commonly known as:  INVOKAMET  Take 1 tablet twice daily     carvedilol 12.5 MG tablet  Commonly known as:  COREG  TAKE 1 TABLET (12.5 MG TOTAL) BY MOUTH 2 (TWO) TIMES DAILY.     cholecalciferol 1000 UNITS tablet  Commonly known as:  VITAMIN D  Take 1,000 Units by mouth daily.     Co Q-10 100 MG Caps  Take 1 capsule by mouth 2 (two) times daily.     felodipine 10 MG 24 hr tablet  Commonly known as:  PLENDIL  Take 1 tablet (10 mg total) by mouth daily.     fluconazole 150 MG tablet  Commonly known as:  DIFLUCAN  Take 1 tablet (150 mg total) by mouth once.     hydrochlorothiazide 12.5 MG capsule  Commonly known as:  MICROZIDE  Take 12.5 mg by mouth daily.     Insulin Glargine 100 UNIT/ML Solostar Pen  Commonly known as:  LANTUS SOLOSTAR  Inject 22 Units into the skin daily.     insulin lispro 100 UNIT/ML KiwkPen  Commonly known as:  HUMALOG  Inject 0.06-0.08 mLs (6-8 Units total) into the skin 2 (two) times daily.     Insulin Pen Needle 31G X 5 MM Misc  Use as directed twice a day     levothyroxine 88 MCG tablet  Commonly known as:  SYNTHROID, LEVOTHROID  TAKE 1 TABLET BY MOUTH EVERY DAY     metFORMIN 750 MG 24 hr tablet  Commonly known as:  GLUCOPHAGE-XR  Take 1 tablet (750 mg total) by mouth 2 (two) times daily.     multivitamins  ther.  w/minerals Tabs tablet  Take 1 tablet by mouth daily.     rosuvastatin 20 MG tablet  Commonly known as:  CRESTOR  Take 1 tablet (20 mg total) by mouth every evening.     TUMS 500 MG chewable tablet  Generic drug:  calcium carbonate  Chew 1 tablet by mouth daily as needed for heartburn (indigestion).        Allergies: No Known Allergies  Past Medical History  Diagnosis Date  . Thyroid disease   . Diabetes mellitus   . Lumbago   . Aortic valve disorders     S/P AVR 06/2012  . Hyperlipidemia   . Heart murmur   . Hypertension   . Hypothyroidism   . Depression   . Shortness of breath   . Asthma     AS CHILD   . Arthritis     Past Surgical History  Procedure Laterality Date  . Tubal ligation    . Breast surgery      LT BREAST BX BENIGN  . Aortic valve replacement N/A 06/25/2012    Procedure: AORTIC VALVE REPLACEMENT (AVR);  Surgeon: Grace Isaac, MD;  Location: [place] [facility] [facility];  Service: Open Heart Surgery;  Laterality: N/A;  . Coronary artery bypass graft N/A 06/25/2012    Procedure: Coronary artery bypass graft  times two using left internal mammary artery and right leg greater saphenous vein;  Surgeon: Grace Isaac, MD;  Location: [place];  Service: Open Heart Surgery;  Laterality: N/A;  . Intraoperative transesophageal echocardiogram N/A 06/25/2012    Procedure: INTRAOPERATIVE TRANSESOPHAGEAL ECHOCARDIOGRAM;  Surgeon: Grace Isaac, MD;  Location: [place];  Service: Open Heart Surgery;  Laterality: N/A;    Family History  Problem Relation Age of Onset  . Heart disease Father     Social History:  reports that she has never smoked. She does not have any smokeless tobacco history on file. She reports that she does not drink alcohol or use illicit drugs.  Review of Systems:   She still having tiredness and some insomnia  HYPERTENSION: taking multiple drugs and blood pressure appears to be lower possibly from starting [place]. No lightheadedness  today  HYPOTHYROIDISM: She has had long-standing hypothyroidism  Her dosage has been changed periodically. She has had relatively low TSH levels and this is again low with taking 88 mcg  Lab Results  Component Value Date   FREET4 1.01 09/25/2013   FREET4 1.40 07/28/2013   FREET4 1.19 03/12/2013   TSH 0.22* 01/09/2014   TSH 0.56 09/25/2013   TSH 0.03* 07/28/2013     HYPERLIPIDEMIA: The lipid abnormality consists of elevated LDL; has been variably controlled with Crestor. Being managed by PCP. Recent LDL  borderline    Lab Results  Component Value Date   CHOL 168 11/28/2013   HDL 46.70 11/28/2013   LDLCALC 99 11/28/2013   LDLDIRECT 102.8 11/28/2013   TRIG 112.0 11/28/2013   CHOLHDL 4 11/28/2013  '      Saw podiatrist in 5/15, no new problems   Examination:   BP 112/57  Pulse 64  Temp(Src) 98.2 F (36.8 C)  Resp 16  Ht 5\' 3"  (1.6 m)  Wt 185 lb 12.8 oz (84.278 kg)  BMI 32.92 kg/m2  SpO2 98%  Body mass index is 32.92 kg/(m^2).   No ankle edema  ASSESSMENT/ PLAN:   Diabetes type 2, uncontrolled   See history of present illness for discussion on her current blood sugar patterns, recent management and  problems identified The patient's diabetes control is appearing much better in the last few days especially with using low-dose Invokana She is now tolerating this although concerned about the cost; seems to do better with the combination with metformin Also she has cut back on her portions and eating more salads at lunch  Surprisingly she has not had marked hyperglycemia in the afternoon and evening despite not taking any Humalog at lunch She does not understand the need for mealtime coverage and duration of action of Humalog  Also does not understand the need for adjustment of Lantus based on fasting blood sugars which have been low normal  Discussed insulin adjustment, balanced meals and blood sugar targets  Hypertension: Blood pressure is low normal even with stopping HCTZ  probably from reduced sodium intake, lower portions at mealtimes and Invokana. She will reduce her benazepril  to half tablet and followup with PCP  HYPOTHYROIDISM: This is still not well controlled with TSH low. She will reduce her dose to 75 mcg  Counseling time  today over 50% of today's 25 minute visit  Patient Instructions  Lantus 20 units in am daily and adjust if am sugar in am < 80 or > 130  Take 2-4 units at lunch and supper if eating any Starchy foods or Grease  Reduce Benazepril in 1/2     Brynne Doane 01/13/2014, 9:10 AM

## 2014-03-05 ENCOUNTER — Ambulatory Visit (INDEPENDENT_AMBULATORY_CARE_PROVIDER_SITE_OTHER): Payer: Medicare Other | Admitting: Podiatrist

## 2014-03-05 ENCOUNTER — Encounter: Payer: Self-pay | Admitting: Podiatrist

## 2014-03-05 DIAGNOSIS — B351 Tinea unguium: Secondary | ICD-10-CM

## 2014-03-05 DIAGNOSIS — M79676 Pain in unspecified toe(s): Secondary | ICD-10-CM

## 2014-03-09 ENCOUNTER — Ambulatory Visit (INDEPENDENT_AMBULATORY_CARE_PROVIDER_SITE_OTHER): Payer: Medicare Other | Admitting: Cardiovascular Disease

## 2014-03-09 ENCOUNTER — Other Ambulatory Visit (INDEPENDENT_AMBULATORY_CARE_PROVIDER_SITE_OTHER): Payer: Medicare Other | Admitting: *Deleted

## 2014-03-09 ENCOUNTER — Encounter: Payer: Self-pay | Admitting: Cardiovascular Disease

## 2014-03-09 VITALS — BP 130/70 | HR 57 | Ht 63.0 in | Wt 182.8 lb

## 2014-03-09 DIAGNOSIS — E785 Hyperlipidemia, unspecified: Secondary | ICD-10-CM

## 2014-03-09 DIAGNOSIS — I2510 Atherosclerotic heart disease of native coronary artery without angina pectoris: Secondary | ICD-10-CM

## 2014-03-09 DIAGNOSIS — I350 Nonrheumatic aortic (valve) stenosis: Secondary | ICD-10-CM

## 2014-03-09 DIAGNOSIS — Z951 Presence of aortocoronary bypass graft: Secondary | ICD-10-CM

## 2014-03-09 DIAGNOSIS — I10 Essential (primary) hypertension: Secondary | ICD-10-CM

## 2014-03-09 LAB — HEPATIC FUNCTION PANEL
ALT: 13 U/L (ref 0–35)
AST: 19 U/L (ref 0–37)
Albumin: 3.6 g/dL (ref 3.5–5.2)
Alkaline Phosphatase: 37 U/L — ABNORMAL LOW (ref 39–117)
BILIRUBIN TOTAL: 0.5 mg/dL (ref 0.2–1.2)
Bilirubin, Direct: 0.1 mg/dL (ref 0.0–0.3)
TOTAL PROTEIN: 7.8 g/dL (ref 6.0–8.3)

## 2014-03-09 LAB — LIPID PANEL
CHOL/HDL RATIO: 3
Cholesterol: 153 mg/dL (ref 0–200)
HDL: 46.4 mg/dL (ref >39.00)
LDL CALC: 90 mg/dL (ref 0–99)
NonHDL: 106.6
Triglycerides: 85 mg/dL (ref 0.0–149.0)
VLDL: 17 mg/dL (ref 0.0–40.0)

## 2014-03-09 LAB — BASIC METABOLIC PANEL
BUN: 17 mg/dL (ref 6–23)
CHLORIDE: 101 meq/L (ref 96–112)
CO2: 26 meq/L (ref 19–32)
CREATININE: 0.9 mg/dL (ref 0.4–1.2)
Calcium: 9.8 mg/dL (ref 8.4–10.5)
GFR: 80.78 mL/min (ref >60.00)
Glucose, Bld: 144 mg/dL — ABNORMAL HIGH (ref 70–99)
Potassium: 3.9 mEq/L (ref 3.5–5.1)
Sodium: 140 mEq/L (ref 135–145)

## 2014-03-09 NOTE — Progress Notes (Signed)
[name] Date of Birth: 17-Apr-1933 Medical Record #[id number]  Problem List 1. CAD - s/p CABG 2-27 14  2. Aortic stenosis - s/p 21 mm pericardial aortic valve 3. Post op atrial fibrillation 4. HTN   History of Present Illness: [name] is seen back today for a 2 week check.  She has had recent AVR and CABG x 2 per Dr. Servando Snare. This was with a LIMA to the LAD and SVG to the LCX,  with a pericardial Edwards tissue valve #21 mm. Did have post op atrial fib - on amiodarone. Other issues include past AS, DM, HLD, HTN and hypothryoidism.   I saw her 2 weeks ago. I gave her a short course of Lasix for some volume overload. We also stopped her amiodarone due to prolonged QT.   She comes back today. She is here alone. Weight is down 7 pounds. She is doing well. Feeling better and stronger. Still with some pedal edema - says she did not have this prior to her surgery. Still taking 40 mg of Lasix.  Walking in her house. Little scared about venturing outside. Ready to go to cardiac rehab. Not dizzy or lightheaded. No shortness of breath.   Sep 12, 2012:  [name] is doing well after her CABG and AVR.   She has a swollen area at one of the SVG harvest sites.  No redness. Mildly tender.    She has had some post op anemia.  Her potassium has been low.    Oct. 24, 2014:  [name] is doing well.  She is recovering well.  She needs to get out and exercise.    Feb. 5, 2015:  [name] has not been feeling well.  She has fatigue and dyspnea.   She also has chest fullness , gas / indigestion with even small amounts of exertion.   these symptoms are associated with diaphoresis.    She tries to get out and walk every day.   She had CABG and AVR 1 year ago.    An echocardiogram several months after her CA BG / AVR showed a normal prosthetic AV ( 21 mm bioprosthetic)   She felt well for several months after surgery and then started going down hill.   Sep 10, 2013:  [name] is doing OK.  She has  some fatigue at times.  Walks on occasion  Nov. 9, 2015:  [name] is doing well.  She has history of coronary artery bypass grafting and aortic valve replacement and debris, 2014. Chest postop atrial fibrillation that was treated with amiodarone for short term. She has a history of diabetes mellitus, hyperlipidemia, hypertension, and hypothyroidism.  She has lots of fatigue. Thyroid meds have been adjusted.      Current Outpatient Prescriptions on File Prior to Visit  Medication Sig Dispense Refill  . aspirin EC 81 MG tablet Take 1 tablet (81 mg total) by mouth daily.    . benazepril (LOTENSIN) 20 MG tablet Take 1 tablet (20 mg total) by mouth daily. (Patient taking differently: Take 10 mg by mouth daily. ) 30 tablet   . calcium carbonate (TUMS) 500 MG chewable tablet Chew 1 tablet by mouth daily as needed for heartburn (indigestion).    . Canagliflozin-Metformin HCl (INVOKAMET) 50-1000 MG TABS Take 1 tablet twice daily 60 tablet 3  . carvedilol (COREG) 12.5 MG tablet TAKE 1 TABLET (12.5 MG TOTAL) BY MOUTH 2 (TWO) TIMES DAILY. 60 tablet 6  .  cholecalciferol (VITAMIN D) 1000 UNITS tablet Take 1,000 Units by mouth daily.    . Coenzyme Q10 (CO Q-10) 100 MG CAPS Take 1 capsule by mouth 2 (two) times daily.    . felodipine (PLENDIL) 10 MG 24 hr tablet Take 1 tablet (10 mg total) by mouth daily. 90 tablet 3  . Insulin Glargine (LANTUS SOLOSTAR) 100 UNIT/ML Solostar Pen Inject 22 Units into the skin daily. (Patient taking differently: Inject 18 Units into the skin daily. ) 5 pen 5  . insulin lispro (HUMALOG) 100 UNIT/ML KiwkPen Inject 0.06-0.08 mLs (6-8 Units total) into the skin 2 (two) times daily. 15 mL 3  . Insulin Pen Needle 31G X 5 MM MISC Use as directed twice a day 60 each 5  . levothyroxine (SYNTHROID, LEVOTHROID) 75 MCG tablet Take 1 tablet (75 mcg total) by mouth daily. 30 tablet 3  . metFORMIN (GLUCOPHAGE-XR) 750 MG 24 hr tablet Take 1 tablet (750 mg total) by mouth 2 (two) times  daily. 120 tablet 5  . Multiple Vitamins-Minerals (MULTIVITAMINS THER. W/MINERALS) TABS Take 1 tablet by mouth daily.    . rosuvastatin (CRESTOR) 20 MG tablet Take 1 tablet (20 mg total) by mouth every evening. 90 tablet 5   No current facility-administered medications on file prior to visit.    No Known Allergies  Past Medical History  Diagnosis Date  . Thyroid disease   . Diabetes mellitus   . Lumbago   . Aortic valve disorders     S/P AVR 06/2012  . Hyperlipidemia   . Heart murmur   . Hypertension   . Hypothyroidism   . Depression   . Shortness of breath   . Asthma     AS CHILD   . Arthritis     Past Surgical History  Procedure Laterality Date  . Tubal ligation    . Breast surgery      LT BREAST BX BENIGN  . Aortic valve replacement N/A 06/25/2012    Procedure: AORTIC VALVE REPLACEMENT (AVR);  Surgeon: Grace Isaac, MD;  Location: [place];  Service: Open Heart Surgery;  Laterality: N/A;  . Coronary artery bypass graft N/A 06/25/2012    Procedure: Coronary artery bypass graft  times two using left internal mammary artery and right leg greater saphenous vein;  Surgeon: Grace Isaac, MD;  Location: [place];  Service: Open Heart Surgery;  Laterality: N/A;  . Intraoperative transesophageal echocardiogram N/A 06/25/2012    Procedure: INTRAOPERATIVE TRANSESOPHAGEAL ECHOCARDIOGRAM;  Surgeon: Grace Isaac, MD;  Location: [facility];  Service: Open Heart Surgery;  Laterality: N/A;    History  Smoking status  . Never Smoker   Smokeless tobacco  . Not on file    History  Alcohol Use No    Family History  Problem Relation Age of Onset  . Heart disease Father     Review of Systems: The review of systems is per the HPI.  All other systems were reviewed and are negative.  Physical Exam: BP 140/80 mmHg  Pulse 57  Ht 5\' 3"  (1.6 m)  Wt 182 lb 12.8 oz (82.918 kg)  BMI 32.39 kg/m2 Patient is very pleasant and in no acute distress. Skin is warm and dry. Color is  normal.   HEENT is unremarkable. Normocephalic/atraumatic. PERRL. Sclera are nonicteric. Neck is supple. No masses. No JVD.  Lungs are clear.  Cardiac exam shows a regular rate and rhythm.  Soft systolic murmur.  Abdomen is soft.  Extremities are without edema.  Gait and ROM are intact. No gross neurologic deficits noted.  LABORATORY DATA: Pending   Lab Results  Component Value Date   WBC 6.2 06/05/2013   HGB 10.4* 06/05/2013   HCT 31.7* 06/05/2013   PLT 237.0 06/05/2013   GLUCOSE 97 01/09/2014   CHOL 168 11/28/2013   TRIG 112.0 11/28/2013   HDL 46.70 11/28/2013   LDLDIRECT 102.8 11/28/2013   LDLCALC 99 11/28/2013   ALT 11 01/09/2014   AST 20 01/09/2014   NA 139 01/09/2014   K 3.7 01/09/2014   CL 103 01/09/2014   CREATININE 0.9 01/09/2014   BUN 18 01/09/2014   CO2 29 01/09/2014   TSH 0.22* 01/09/2014   INR 1.57* 06/25/2012   HGBA1C 7.5* 01/09/2014   MICROALBUR 8.8* 06/02/2013    ECG: 06/05/2013, sinus bradycardia at 57 beats a minute. She has no ST or T wave changes. EKG is otherwise normal. Assessment / Plan:

## 2014-03-09 NOTE — Patient Instructions (Addendum)
Your physician recommends that you continue on your current medications as directed. Please refer to the Current Medication list given to you today.  Your physician wants you to follow-up in: 6 months with Dr. Acie Fredrickson.  You will receive a reminder letter in the mail two months in advance. If you don't receive a letter, please call our office to schedule the follow-up appointment.  Your physician recommends that you have lab work:  TODAY

## 2014-03-09 NOTE — Assessment & Plan Note (Addendum)
Her blood pressure is stable. I have encouraged her to exercise on a regular basis. Her potassium was on the lower side of normal when  We checked it back in September. We'll check repeat labs today.

## 2014-03-09 NOTE — Assessment & Plan Note (Signed)
Check fasting labs today. 

## 2014-03-09 NOTE — Assessment & Plan Note (Signed)
Her valve seems to be working well by echo in January, 2003 she has a soft systolic murmur.

## 2014-03-10 NOTE — Progress Notes (Signed)
HPI: Patient presents today for follow up of diabetic foot and nail care. Past medical history, meds, and allergies reviewed. Patient states blood sugar is under good control. She denies numbness or tingling.  Objective: Objective: Patients chart is reviewed. Vascular status reveals pedal pulses noted at 2 out of 4 dp and pt bilateral . Neurological sensation is Normal to Semmes Weinstein monofilament bilateral at 5/5 sites bilateral. Dermatological exam reveals absence of pre ulcerative/ hyperkeratotic lesions. Toenails are elongated, incurvated, discolored, dystrophic with ingrown deformity present. Hammertoe right 2nd digit is present   Assessment: Diabetes , hammertoe, painful mycotic nails   Plan: Discussed treatment options and alternatives. Debrided nails without complication. eturn appointment recommended at routine intervals of 3 months    

## 2014-03-11 ENCOUNTER — Ambulatory Visit: Payer: Medicare Other

## 2014-03-11 ENCOUNTER — Other Ambulatory Visit: Payer: Self-pay | Admitting: Endocrinology

## 2014-03-11 DIAGNOSIS — E1165 Type 2 diabetes mellitus with hyperglycemia: Secondary | ICD-10-CM

## 2014-03-11 DIAGNOSIS — IMO0002 Reserved for concepts with insufficient information to code with codable children: Secondary | ICD-10-CM

## 2014-03-11 DIAGNOSIS — E039 Hypothyroidism, unspecified: Secondary | ICD-10-CM

## 2014-03-11 LAB — TSH: TSH: 2.81 u[IU]/mL (ref 0.35–4.50)

## 2014-03-11 LAB — GLUCOSE, RANDOM: Glucose, Bld: 206 mg/dL — ABNORMAL HIGH (ref 70–99)

## 2014-03-11 LAB — T4, FREE: FREE T4: 1.07 ng/dL (ref 0.60–1.60)

## 2014-03-12 ENCOUNTER — Telehealth: Payer: Self-pay | Admitting: Nurse Practitioner

## 2014-03-12 NOTE — Telephone Encounter (Signed)
Reviewed lab results with patient who verbalized understanding and gratitude

## 2014-03-16 ENCOUNTER — Telehealth: Payer: Self-pay | Admitting: Endocrinology

## 2014-03-16 ENCOUNTER — Ambulatory Visit (INDEPENDENT_AMBULATORY_CARE_PROVIDER_SITE_OTHER): Payer: Medicare Other | Admitting: Endocrinology

## 2014-03-16 ENCOUNTER — Encounter: Payer: Self-pay | Admitting: Endocrinology

## 2014-03-16 VITALS — BP 134/86 | HR 61 | Temp 98.2°F | Resp 16 | Ht 63.0 in | Wt 184.0 lb

## 2014-03-16 DIAGNOSIS — E039 Hypothyroidism, unspecified: Secondary | ICD-10-CM

## 2014-03-16 DIAGNOSIS — I10 Essential (primary) hypertension: Secondary | ICD-10-CM

## 2014-03-16 DIAGNOSIS — E1165 Type 2 diabetes mellitus with hyperglycemia: Secondary | ICD-10-CM

## 2014-03-16 DIAGNOSIS — I2510 Atherosclerotic heart disease of native coronary artery without angina pectoris: Secondary | ICD-10-CM

## 2014-03-16 DIAGNOSIS — IMO0002 Reserved for concepts with insufficient information to code with codable children: Secondary | ICD-10-CM

## 2014-03-16 NOTE — Patient Instructions (Addendum)
No Humalog after meals   Have a protein with breakfast daily even with Cheerios  Keep walking  Lantus 18 daily; Levemir 20 if used

## 2014-03-16 NOTE — Progress Notes (Signed)
[name] 80 y.o.   Reason for Appointment: Diabetes follow-up   History of Present Illness   Diagnosis: Type 2 DIABETES MELITUS, date of diagnosis:   2000   She has been on insulin since 2004 with difficulty controlling adequately because of compliance with diet and variability in blood sugars. Her previous records are not available at present, however she has been on basal insulin with mealtime coverage since at least 2007 She has been tried on Byetta previously but did not tolerate this well She tried Victoza but didn't not benefit from Victoza 0.6 dosage; had nausea from 1.2 mg; did not continue this because of this side effect  RECENT history:   Her blood sugars are excellent with continuing low dose Invokamet in the morning  She is requiring less mealtime insulin especially at lunch and supper as well as 2-4 units less on the Lantus Also she  has not had much of an appetite especially in the evenings Her A1c is not due as the attending fructosamine is not available today Basal insulin: She is adjusting the dose arbitrarily based on the blood sugars in the evenings Her blood sugars are showing the following patterns:   She has take in some Humalog at lunch when she is eating still has periodic high readings in the afternoon based on her intake and sometimes may not take insulin when eating out  Fasting blood sugars are excellent with readings as low as 78  Blood sugars are generally not higher after supper; Has a couple of relatively high readings probably from not taking Humalog for the evening meal  She has taken Humalog once after evening meal when glucose was 192 causing marked hypoglycemia and also had another reading of 62 probably with postprandial Humalog  Blood sugars are sporadically higher after breakfast if she is eating cereal  Not checking blood sugar before lunch usually  Oral hypoglycemic drugs: Metformin 750 mg at supper, Invokamet 50/1000 in AM    .   INSULIN: Lantus 18-20 units daily, HUMALOG 6 acb, 0-6 at lunch or supper recently     Proper timing of medications in relation to meals: not at lunch or supper at times.          Monitors blood glucose:  3.3 times daily     Glucometer: One Touch.          Blood Glucose readings from meter download:   PREMEAL Breakfast Lunch Dinner Bedtime Overall  Glucose range: 78-159   42-130   median: 105    118   POST-MEAL PC Breakfast PC Lunch PC Dinner  Glucose range: 105-202 108-239 76-192  Mean/median:      Meals: 3 meals per day:  lunch 12-2 pm, dinner 5 pm Physical activity: exercise: walks upto 30 min; 3-4 /7 days  Wt Readings from Last 3 Encounters:  03/16/14 184 lb (83.462 kg)  03/09/14 182 lb 12.8 oz (82.918 kg)  01/13/14 185 lb 12.8 oz (84.278 kg)    Lab Results  Component Value Date   HGBA1C 7.5* 01/09/2014   HGBA1C 7.7* 09/25/2013   HGBA1C 8.0* 06/02/2013   Lab Results  Component Value Date   MICROALBUR 8.8* 06/02/2013   LDLCALC 90 03/09/2014   CREATININE 0.9 03/09/2014    Office Visit on 03/11/2014  Component Date Value Ref Range Status  . TSH 03/11/2014 2.81  0.35 - 4.50 uIU/mL Final  . Free T4 03/11/2014 1.07  0.60 - 1.60 ng/dL Final  . Glucose, Bld 03/11/2014 206*  70 - 99 mg/dL Final       Medication List       This list is accurate as of: 03/16/14  8:49 AM.  Always use your most recent med list.               aspirin EC 81 MG tablet  Take 1 tablet (81 mg total) by mouth daily.     benazepril 20 MG tablet  Commonly known as:  LOTENSIN  Take 1 tablet (20 mg total) by mouth daily.     Canagliflozin-Metformin HCl 50-1000 MG Tabs  Commonly known as:  INVOKAMET  Take 1 tablet twice daily     carvedilol 12.5 MG tablet  Commonly known as:  COREG  TAKE 1 TABLET (12.5 MG TOTAL) BY MOUTH 2 (TWO) TIMES DAILY.     cholecalciferol 1000 UNITS tablet  Commonly known as:  VITAMIN D  Take 1,000 Units by mouth daily.     Co Q-10 100 MG Caps  Take 1  capsule by mouth 2 (two) times daily.     felodipine 10 MG 24 hr tablet  Commonly known as:  PLENDIL  Take 1 tablet (10 mg total) by mouth daily.     Insulin Glargine 100 UNIT/ML Solostar Pen  Commonly known as:  LANTUS SOLOSTAR  Inject 22 Units into the skin daily.     insulin lispro 100 UNIT/ML KiwkPen  Commonly known as:  HUMALOG  Inject 0.06-0.08 mLs (6-8 Units total) into the skin 2 (two) times daily.     Insulin Pen Needle 31G X 5 MM Misc  Use as directed twice a day     levothyroxine 75 MCG tablet  Commonly known as:  SYNTHROID, LEVOTHROID  Take 1 tablet (75 mcg total) by mouth daily.     metFORMIN 750 MG 24 hr tablet  Commonly known as:  GLUCOPHAGE-XR  Take 1 tablet (750 mg total) by mouth 2 (two) times daily.     multivitamins ther. w/minerals Tabs tablet  Take 1 tablet by mouth daily.     rosuvastatin 20 MG tablet  Commonly known as:  CRESTOR  Take 1 tablet (20 mg total) by mouth every evening.     TUMS 500 MG chewable tablet  Generic drug:  calcium carbonate  Chew 1 tablet by mouth daily as needed for heartburn (indigestion).        Allergies: No Known Allergies  Past Medical History  Diagnosis Date  . Thyroid disease   . Diabetes mellitus   . Lumbago   . Aortic valve disorders     S/P AVR 06/2012  . Hyperlipidemia   . Heart murmur   . Hypertension   . Hypothyroidism   . Depression   . Shortness of breath   . Asthma     AS CHILD   . Arthritis     Past Surgical History  Procedure Laterality Date  . Tubal ligation    . Breast surgery      LT BREAST BX BENIGN  . Aortic valve replacement N/A 06/25/2012    Procedure: AORTIC VALVE REPLACEMENT (AVR);  Surgeon: Grace Isaac, MD;  Location: [facility];  Service: Open Heart Surgery;  Laterality: N/A;  . Coronary artery bypass graft N/A 06/25/2012    Procedure: Coronary artery bypass graft  times two using left internal mammary artery and right leg greater saphenous vein;  Surgeon: Grace Isaac,  MD;  Location: [facility];  Service: Open Heart Surgery;  Laterality: N/A;  . Intraoperative  transesophageal echocardiogram N/A 06/25/2012    Procedure: INTRAOPERATIVE TRANSESOPHAGEAL ECHOCARDIOGRAM;  Surgeon: Grace Isaac, MD;  Location: [facility];  Service: Open Heart Surgery;  Laterality: N/A;    Family History  Problem Relation Age of Onset  . Heart disease Father     Social History:  reports that she has never smoked. She does not have any smokeless tobacco history on file. She reports that she does not drink alcohol or use illicit drugs.  Review of Systems:   She still having tiredness at times  HYPERTENSION: taking multiple drugs and blood pressure his overall fairly well controlled Blood pressure is relatively higher today but was normal with cardiologist  HYPOTHYROIDISM: She has had long-standing hypothyroidism  Her dosage has been changed periodically. She has had better TSH level with reducing her dose to 75 g  Lab Results  Component Value Date   FREET4 1.07 03/11/2014   FREET4 1.01 09/25/2013   FREET4 1.40 07/28/2013   TSH 2.81 03/11/2014   TSH 0.22* 01/09/2014   TSH 0.56 09/25/2013     HYPERLIPIDEMIA: The lipid abnormality consists of elevated LDL; has been variably controlled with Crestor. Being managed by PCP.  LDL  borderline    Lab Results  Component Value Date   CHOL 153 03/09/2014   HDL 46.40 03/09/2014   LDLCALC 90 03/09/2014   LDLDIRECT 102.8 11/28/2013   TRIG 85.0 03/09/2014   CHOLHDL 3 03/09/2014  '      Saw cardiologist recently   Examination:   BP 134/86 mmHg  Pulse 61  Temp(Src) 98.2 F (36.8 C)  Resp 16  Ht 5\' 3"  (1.6 m)  Wt 184 lb (83.462 kg)  BMI 32.60 kg/m2  SpO2 98%  Body mass index is 32.6 kg/(m^2).   No ankle edema  ASSESSMENT/ PLAN:   Diabetes type 2, uncontrolled   See history of present illness for discussion on her current blood sugar patterns, recent management and problems identified The patient's diabetes control  is appearing much better overall with using low-dose Invokana She is now tolerating this although only taking 50 mg in combination with metformin in the morning Also she has cut back on her portions and not needing as much mealtime insulin Reminded her to take her lunchtime dose consistently when eating some carbohydrate Also advised her not to take any correction doses postprandially in the evenings which is causing hypoglycemia at times  Surprisingly she has not had marked hyperglycemia in the afternoon and evening despite not taking any Humalog at lunch She does not understand the need for mealtime coverage and duration of action of Humalog  Also does not understand the need for adjustment of Lantus based on fasting blood sugars which have been low normal  Discussed insulin adjustment, balanced meals and blood sugar targets  Hypertension: Blood pressure is good and she can continue taking a half a tablet of Lotensin  HYPOTHYROIDISM: This is  well controlled with TSH normal with changing her dose to 75 mcg   There are no Patient Instructions on file for this visit. Donevin Sainsbury 03/16/2014, 8:49 AM

## 2014-03-16 NOTE — Telephone Encounter (Signed)
Patient ask if you would call her she has a question about one of the pill she has to take.

## 2014-03-17 NOTE — Telephone Encounter (Signed)
Patient ask if you would give her a call as soon as possible.

## 2014-03-17 NOTE — Telephone Encounter (Signed)
lmtcb

## 2014-03-18 ENCOUNTER — Telehealth: Payer: Self-pay | Admitting: *Deleted

## 2014-03-18 NOTE — Telephone Encounter (Signed)
Patient wants to know if she should start back on her fluid pill since you stopped her invokana, please advise. CB# [phone]

## 2014-03-18 NOTE — Telephone Encounter (Signed)
She is stopping the Invokana only for her trip.  She can take the HCTZ on the days she is not taking Invokana

## 2014-03-18 NOTE — Telephone Encounter (Signed)
Noted, patient is aware. 

## 2014-04-06 ENCOUNTER — Other Ambulatory Visit: Payer: Self-pay | Admitting: Cardiovascular Disease

## 2014-04-06 ENCOUNTER — Encounter (HOSPITAL_COMMUNITY): Payer: Self-pay | Admitting: Family Medicine

## 2014-04-06 ENCOUNTER — Emergency Department (HOSPITAL_COMMUNITY)
Admission: EM | Admit: 2014-04-06 | Discharge: 2014-04-06 | Disposition: A | Discharge status: Home / Self Care | Payer: Medicare Other | Attending: Emergency Medicine | Admitting: Emergency Medicine

## 2014-04-06 ENCOUNTER — Encounter (HOSPITAL_COMMUNITY): Payer: Self-pay | Admitting: Emergency Medicine

## 2014-04-06 ENCOUNTER — Emergency Department (INDEPENDENT_AMBULATORY_CARE_PROVIDER_SITE_OTHER)
Admission: EM | Admit: 2014-04-06 | Discharge: 2014-04-06 | Disposition: A | Discharge status: Home / Self Care | Payer: Medicare Other | Source: Home / Self Care | Attending: Emergency Medicine | Admitting: Emergency Medicine

## 2014-04-06 DIAGNOSIS — Z794 Long term (current) use of insulin: Secondary | ICD-10-CM | POA: Diagnosis not present

## 2014-04-06 DIAGNOSIS — J45909 Unspecified asthma, uncomplicated: Secondary | ICD-10-CM | POA: Diagnosis not present

## 2014-04-06 DIAGNOSIS — Z79899 Other long term (current) drug therapy: Secondary | ICD-10-CM | POA: Insufficient documentation

## 2014-04-06 DIAGNOSIS — M79605 Pain in left leg: Secondary | ICD-10-CM | POA: Diagnosis not present

## 2014-04-06 DIAGNOSIS — R011 Cardiac murmur, unspecified: Secondary | ICD-10-CM | POA: Insufficient documentation

## 2014-04-06 DIAGNOSIS — F329 Major depressive disorder, single episode, unspecified: Secondary | ICD-10-CM | POA: Insufficient documentation

## 2014-04-06 DIAGNOSIS — E119 Type 2 diabetes mellitus without complications: Secondary | ICD-10-CM | POA: Insufficient documentation

## 2014-04-06 DIAGNOSIS — I82402 Acute embolism and thrombosis of unspecified deep veins of left lower extremity: Secondary | ICD-10-CM

## 2014-04-06 DIAGNOSIS — I10 Essential (primary) hypertension: Secondary | ICD-10-CM | POA: Diagnosis not present

## 2014-04-06 DIAGNOSIS — M79609 Pain in unspecified limb: Secondary | ICD-10-CM

## 2014-04-06 DIAGNOSIS — Z951 Presence of aortocoronary bypass graft: Secondary | ICD-10-CM | POA: Diagnosis not present

## 2014-04-06 DIAGNOSIS — Z7982 Long term (current) use of aspirin: Secondary | ICD-10-CM | POA: Insufficient documentation

## 2014-04-06 DIAGNOSIS — M1990 Unspecified osteoarthritis, unspecified site: Secondary | ICD-10-CM | POA: Insufficient documentation

## 2014-04-06 DIAGNOSIS — E039 Hypothyroidism, unspecified: Secondary | ICD-10-CM | POA: Insufficient documentation

## 2014-04-06 LAB — D-DIMER, QUANTITATIVE (NOT AT ARMC): D-Dimer, Quant: 2.08 ug/mL-FEU — ABNORMAL HIGH (ref 0.00–0.48)

## 2014-04-06 NOTE — ED Provider Notes (Signed)
CSN: [id number]     Arrival date & time 04/06/14  1407 History   First MD Initiated Contact with Patient 04/06/14 1606     Chief Complaint  Patient presents with  . Leg Pain     (Consider location/radiation/quality/duration/timing/severity/associated sxs/prior Treatment) Patient is a 80 y.o. female presenting with leg pain. The history is provided by the patient.  Leg Pain Location:  Leg Leg location:  L upper leg Pain details:    Quality:  Aching and burning   Severity:  Mild   Onset quality:  Sudden   Timing:  Intermittent (only at night when trying to go to sleep)   Progression:  Unchanged Chronicity:  New Dislocation: no   Associated symptoms: no fever     Past Medical History  Diagnosis Date  . Thyroid disease   . Diabetes mellitus   . Lumbago   . Aortic valve disorders     S/P AVR 06/2012  . Hyperlipidemia   . Heart murmur   . Hypertension   . Hypothyroidism   . Depression   . Shortness of breath   . Asthma     AS CHILD   . Arthritis    Past Surgical History  Procedure Laterality Date  . Tubal ligation    . Breast surgery      LT BREAST BX BENIGN  . Aortic valve replacement N/A 06/25/2012    Procedure: AORTIC VALVE REPLACEMENT (AVR);  Surgeon: Grace Isaac, MD;  Location: [facility];  Service: Open Heart Surgery;  Laterality: N/A;  . Coronary artery bypass graft N/A 06/25/2012    Procedure: Coronary artery bypass graft  times two using left internal mammary artery and right leg greater saphenous vein;  Surgeon: Grace Isaac, MD;  Location: [facility];  Service: Open Heart Surgery;  Laterality: N/A;  . Intraoperative transesophageal echocardiogram N/A 06/25/2012    Procedure: INTRAOPERATIVE TRANSESOPHAGEAL ECHOCARDIOGRAM;  Surgeon: Grace Isaac, MD;  Location: [facility];  Service: Open Heart Surgery;  Laterality: N/A;   Family History  Problem Relation Age of Onset  . Heart disease Father    History  Substance Use Topics  . Smoking status: Never Smoker    . Smokeless tobacco: Not on file  . Alcohol Use: No   OB History    No data available     Review of Systems  Constitutional: Negative for fever.  Respiratory: Negative for cough and shortness of breath.   All other systems reviewed and are negative.     Allergies  Review of patient's allergies indicates no known allergies.  Home Medications   Prior to Admission medications   Medication Sig Start Date End Date Taking? Authorizing Provider  aspirin EC 81 MG tablet Take 1 tablet (81 mg total) by mouth daily. 09/10/13   Thayer Headings, MD  benazepril (LOTENSIN) 20 MG tablet Take 1 tablet (20 mg total) by mouth daily. Patient taking differently: Take 10 mg by mouth daily.  07/01/12   Donielle Liston Alba, PA-C  calcium carbonate (TUMS) 500 MG chewable tablet Chew 1 tablet by mouth daily as needed for heartburn (indigestion).    Historical Provider, MD  Canagliflozin-Metformin HCl (INVOKAMET) 50-1000 MG TABS Take 1 tablet twice daily 12/02/13   Elayne Snare, MD  carvedilol (COREG) 12.5 MG tablet TAKE 1 TABLET (12.5 MG TOTAL) BY MOUTH 2 (TWO) TIMES DAILY. 04/06/14   Thayer Headings, MD  cholecalciferol (VITAMIN D) 1000 UNITS tablet Take 1,000 Units by mouth daily.    Historical  Provider, MD  Coenzyme Q10 (CO Q-10) 100 MG CAPS Take 1 capsule by mouth 2 (two) times daily.    Historical Provider, MD  felodipine (PLENDIL) 10 MG 24 hr tablet Take 1 tablet (10 mg total) by mouth daily. 07/31/12   Burtis Junes, NP  Insulin Glargine (LANTUS SOLOSTAR) 100 UNIT/ML Solostar Pen Inject 22 Units into the skin daily. Patient taking differently: Inject 18 Units into the skin daily.  06/11/13   Elayne Snare, MD  insulin lispro (HUMALOG) 100 UNIT/ML KiwkPen Inject 0.06-0.08 mLs (6-8 Units total) into the skin 2 (two) times daily. Patient taking differently: Inject 4-6 Units into the skin 2 (two) times daily.  12/02/13   Elayne Snare, MD  Insulin Pen Needle 31G X 5 MM MISC Use as directed twice a day 05/13/13   Elayne Snare, MD  levothyroxine (SYNTHROID, LEVOTHROID) 75 MCG tablet Take 1 tablet (75 mcg total) by mouth daily. 01/13/14   Elayne Snare, MD  metFORMIN (GLUCOPHAGE-XR) 750 MG 24 hr tablet Take 1 tablet (750 mg total) by mouth 2 (two) times daily. 11/18/12   Elayne Snare, MD  Multiple Vitamins-Minerals (MULTIVITAMINS THER. W/MINERALS) TABS Take 1 tablet by mouth daily.    Historical Provider, MD  rosuvastatin (CRESTOR) 20 MG tablet Take 1 tablet (20 mg total) by mouth every evening. 03/18/13   Elayne Snare, MD   BP 181/64 mmHg  Pulse 59  Temp(Src) 98.3 F (36.8 C) (Oral)  Resp 16  Ht 5' 3.5" (1.613 m)  Wt 182 lb 1 oz (82.583 kg)  BMI 31.74 kg/m2  SpO2 97% Physical Exam  Constitutional: She is oriented to person, place, and time. She appears well-developed and well-nourished. No distress.  HENT:  Head: Normocephalic and atraumatic.  Mouth/Throat: Oropharynx is clear and moist.  Eyes: EOM are normal. Pupils are equal, round, and reactive to light.  Neck: Normal range of motion. Neck supple.  Cardiovascular: Normal rate and regular rhythm.  Exam reveals no friction rub.   No murmur heard. Pulmonary/Chest: Effort normal and breath sounds normal. No respiratory distress. She has no wheezes. She has no rales.  Abdominal: Soft. She exhibits no distension. There is no tenderness. There is no rebound.  Musculoskeletal: Normal range of motion. She exhibits no edema or tenderness.  Neurological: She is alert and oriented to person, place, and time.  Skin: She is not diaphoretic.  Nursing note and vitals reviewed.   ED Course  Procedures (including critical care time) Labs Review Labs Reviewed - No data to display  Imaging Review No results found.   EKG Interpretation None      Author: Dareen Piano Service: Vascular Lab Author Type: Cardiovascular Sonographer   Filed: 04/06/2014 3:01 PM Note Time: 04/06/2014 3:01 PM Status: Signed   Editor: Vilma Prader Slaughter (Cardiovascular  Sonographer)     Expand All Collapse All   VASCULAR LAB PRELIMINARY PRELIMINARY PRELIMINARY PRELIMINARY  Left lower extremity venous duplex completed.   Preliminary report: Left: No evidence of DVT, superficial thrombosis, or Baker's cyst.  SLAUGHTER, [place], RVS 04/06/2014, 3:01 PM      MDM   Final diagnoses:  Left leg pain    49F sent from Urgent Care for LLE Doppler study. Study is negative. Given these results, as above. Patient does not want any pain meds. Instructed to f/u with PCP for possible peripheral neuropathy medications.     Evelina Bucy, MD 04/06/14 [phone]

## 2014-04-06 NOTE — ED Notes (Signed)
Pt states that she feels a warm sensation on her leg since 04/04/2014

## 2014-04-06 NOTE — Discharge Instructions (Signed)
We have determined that your problem requires further evaluation in the emergency department.  We will take care of your transport there.  Once at the emergency department, you will be evaluated by a provider and they will order whatever treatment or tests they deem necessary.  We cannot guarantee that they will do any specific test or do any specific treatment.  ° °

## 2014-04-06 NOTE — ED Provider Notes (Signed)
  Chief Complaint   Left leg discomfort.   History of Present Illness   [name] is a 80 year old female who has had a three-day history of  discomfort in her left lateral thigh from the hip on down to the knee. She feels a sensation of heat as if someone is pouring a bucket of hot water down her leg. The leg feels hot to touch. There is no erythema. She thinks it might be a little bit swollen and she denies any pain or swelling below the knee. She denies any medial leg pain or swelling. She's had no chest pain or shortness of breath. She denies any fever or chills. There is no prior history of DVT or thrombophlebitis. No history of pulmonary embolism. She does not have cancer and she has had no history of paralysis or prolonged immobilization, although she did take a 2 hour plane flight from [place] to [place] and back over the Thanksgiving weekend.  Review of Systems   Other than as noted above, the patient denies any of the following symptoms: Systemic:  No fever, chills, weight gain or loss. Respiratory:  No coughing, wheezing, or shortness of breath. Cardiac:  No chest pain, tightness, pressure or syncope. GI:  No abdominal pain, swelling, distension, nausea, or vomiting. GU:  No dysuria, frequency, or hematuria. Ext:  No joint pain or muscle pain.  [place]   Past medical history, family history, social history, meds, and allergies were reviewed.    Physical Examination     Vital signs:  BP 136/76 mmHg  Pulse 65  Temp(Src) 97.6 F (36.4 C) (Oral)  Resp 16  SpO2 99% Gen:  Alert, oriented, in no distress. Neck:  No tenderness, adenopathy, or JVD. Lungs:  Breath sounds clear and equal bilaterally.  No rales, rhonchi or wheezes. Heart:  Regular rhythm, no gallops or murmers. Abdomen:  Soft, nontender, no organomegaly or mass. Ext:  No obvious swelling of the leg. There was pain to palpation over the lateral thigh, medial thigh, and the calf. Homans sign was positive. No  palpable cord. No distended varicose veins, no pitting edema. Circumference was 42 on the left and 41 on the right. Pedal pulses were full. Joint survey was unremarkable. Neuro:  Alert and oriented times 3.  No muscle weakness.  Sensation intact to light touch. Skin:  Warm and dry.  No rash or skin lesions.  Labs   Results for orders placed or performed during the hospital encounter of 04/06/14  D-dimer, quantitative  Result Value Ref Range   D-Dimer, Quant 2.08 (H) 0.00 - 0.48 ug/mL-FEU    Assessment   The encounter diagnosis was DVT (deep venous thrombosis), left.  Wells score is 1, making her low risk. Her d-dimer is 2.08 with an age specific cutoff of 0.79.  Plan   The patient was transferred to the ED via shuttle in stable condition.  Medical Decision Making:  80 year old female has 3 day history of left thigh discomfort.  Feels like "a bucket of hot water being poured down my leg."  Has slight swelling.  No chest pain or shortness of breath.  No prior DVT or PE or history of cancer.  Recently took a plane flight to [place] and back.  On exam has calf and medial thigh tenderness and pos Homann's sign.  D-dimer is 2.08 (age adjusted cutoff is 0.79.  Suspicion is DVT.  Needs venous duplex.      Harden Mo, MD 04/06/14 [phone]

## 2014-04-06 NOTE — Discharge Instructions (Signed)
Your Ultrasound results are:  Author: Dareen Piano Service: Vascular Lab Author Type: Cardiovascular Sonographer   Filed: 04/06/2014 3:01 PM Note Time: 04/06/2014 3:01 PM Status: Signed   Editor: Vilma Prader Slaughter (Cardiovascular Sonographer)     Expand All Collapse All   VASCULAR LAB PRELIMINARY PRELIMINARY PRELIMINARY PRELIMINARY  Left lower extremity venous duplex completed.   Preliminary report: Left: No evidence of DVT, superficial thrombosis, or Baker's cyst.  SLAUGHTER, [place], RVS 04/06/2014, 3:01 PM       Musculoskeletal Pain Musculoskeletal pain is muscle and boney aches and pains. These pains can occur in any part of the body. Your caregiver may treat you without knowing the cause of the pain. They may treat you if blood or urine tests, X-rays, and other tests were normal.  CAUSES There is often not a definite cause or reason for these pains. These pains may be caused by a type of germ (virus). The discomfort may also come from overuse. Overuse includes working out too hard when your body is not fit. Boney aches also come from weather changes. Bone is sensitive to atmospheric pressure changes. HOME CARE INSTRUCTIONS   Ask when your test results will be ready. Make sure you get your test results.  Only take over-the-counter or prescription medicines for pain, discomfort, or fever as directed by your caregiver. If you were given medications for your condition, do not drive, operate machinery or power tools, or sign legal documents for 24 hours. Do not drink alcohol. Do not take sleeping pills or other medications that may interfere with treatment.  Continue all activities unless the activities cause more pain. When the pain lessens, slowly resume normal activities. Gradually increase the intensity and duration of the activities or exercise.  During periods of severe pain, bed rest may be helpful. Lay or sit in any position that is comfortable.  Putting  ice on the injured area.  Put ice in a bag.  Place a towel between your skin and the bag.  Leave the ice on for 15 to 20 minutes, 3 to 4 times a day.  Follow up with your caregiver for continued problems and no reason can be found for the pain. If the pain becomes worse or does not go away, it may be necessary to repeat tests or do additional testing. Your caregiver may need to look further for a possible cause. SEEK IMMEDIATE MEDICAL CARE IF:  You have pain that is getting worse and is not relieved by medications.  You develop chest pain that is associated with shortness or breath, sweating, feeling sick to your stomach (nauseous), or throw up (vomit).  Your pain becomes localized to the abdomen.  You develop any new symptoms that seem different or that concern you. MAKE SURE YOU:   Understand these instructions.  Will watch your condition.  Will get help right away if you are not doing well or get worse. Document Released: 04/17/2005 Document Revised: 07/10/2011 Document Reviewed: 12/20/2012 [facility] Patient Information 2015 [place], [place]. This information is not intended to replace advice given to you by your health care provider. Make sure you discuss any questions you have with your health care provider.

## 2014-04-06 NOTE — Progress Notes (Signed)
VASCULAR LAB PRELIMINARY  PRELIMINARY  PRELIMINARY  PRELIMINARY  Left lower extremity venous duplex completed.    Preliminary report:  Left:  No evidence of DVT, superficial thrombosis, or Baker's cyst.  Elowyn Raupp, RVS 04/06/2014, 3:01 PM

## 2014-04-06 NOTE — ED Notes (Signed)
Pt sent here from [facility] to R/O blood clot to left leg. sts she has had a few episodes of burning sensation to right leg for the past 2 days. sts happened today after she put insulin in her leg.

## 2014-04-13 ENCOUNTER — Other Ambulatory Visit: Payer: Self-pay | Admitting: *Deleted

## 2014-04-27 ENCOUNTER — Other Ambulatory Visit: Payer: Self-pay | Admitting: Endocrinology

## 2014-05-04 ENCOUNTER — Other Ambulatory Visit: Payer: Self-pay | Admitting: Endocrinology

## 2014-05-05 ENCOUNTER — Telehealth: Payer: Self-pay | Admitting: Endocrinology

## 2014-05-05 NOTE — Telephone Encounter (Signed)
Patient would like to speak with Suanne Marker; states its very important   Call back: [phone]   Thank You

## 2014-05-07 ENCOUNTER — Other Ambulatory Visit: Payer: Self-pay

## 2014-05-07 DIAGNOSIS — Z1231 Encounter for screening mammogram for malignant neoplasm of breast: Secondary | ICD-10-CM

## 2014-05-13 ENCOUNTER — Other Ambulatory Visit (INDEPENDENT_AMBULATORY_CARE_PROVIDER_SITE_OTHER): Payer: Medicare Other

## 2014-05-13 ENCOUNTER — Other Ambulatory Visit: Payer: Self-pay | Admitting: *Deleted

## 2014-05-13 ENCOUNTER — Other Ambulatory Visit: Payer: Self-pay | Admitting: Endocrinology

## 2014-05-13 DIAGNOSIS — IMO0002 Reserved for concepts with insufficient information to code with codable children: Secondary | ICD-10-CM

## 2014-05-13 DIAGNOSIS — E1165 Type 2 diabetes mellitus with hyperglycemia: Secondary | ICD-10-CM

## 2014-05-13 LAB — LIPID PANEL
Cholesterol: 170 mg/dL (ref 0–200)
HDL: 53.8 mg/dL (ref >39.00)
LDL CALC: 102 mg/dL — AB (ref 0–99)
NONHDL: 116.2
Total CHOL/HDL Ratio: 3
Triglycerides: 71 mg/dL (ref 0.0–149.0)
VLDL: 14.2 mg/dL (ref 0.0–40.0)

## 2014-05-13 LAB — GLUCOSE, RANDOM: GLUCOSE: 150 mg/dL — AB (ref 70–99)

## 2014-05-13 LAB — HEMOGLOBIN A1C: Hgb A1c MFr Bld: 7.4 % — ABNORMAL HIGH (ref 4.6–6.5)

## 2014-05-13 MED ORDER — METFORMIN HCL ER 750 MG PO TB24: 750.0000 mg | ORAL_TABLET | Freq: Two times a day (BID) | ORAL | Status: DC

## 2014-05-18 ENCOUNTER — Ambulatory Visit: Payer: Medicare Other | Admitting: Endocrinology

## 2014-05-19 ENCOUNTER — Ambulatory Visit: Payer: Medicare Other

## 2014-05-25 ENCOUNTER — Ambulatory Visit: Payer: Medicare Other

## 2014-05-27 DIAGNOSIS — H524 Presbyopia: Secondary | ICD-10-CM | POA: Diagnosis not present

## 2014-05-27 DIAGNOSIS — H2513 Age-related nuclear cataract, bilateral: Secondary | ICD-10-CM | POA: Diagnosis not present

## 2014-06-02 ENCOUNTER — Ambulatory Visit
Admission: RE | Admit: 2014-06-02 | Discharge: 2014-06-02 | Disposition: A | Discharge status: Home / Self Care | Payer: Medicare Other | Source: Ambulatory Visit

## 2014-06-02 DIAGNOSIS — Z1231 Encounter for screening mammogram for malignant neoplasm of breast: Secondary | ICD-10-CM

## 2014-06-03 ENCOUNTER — Ambulatory Visit (INDEPENDENT_AMBULATORY_CARE_PROVIDER_SITE_OTHER): Payer: Medicare Other | Admitting: Endocrinology

## 2014-06-03 ENCOUNTER — Encounter: Payer: Self-pay | Admitting: Endocrinology

## 2014-06-03 VITALS — BP 118/62 | HR 61 | Temp 98.4°F | Resp 12 | Wt 182.8 lb

## 2014-06-03 DIAGNOSIS — E785 Hyperlipidemia, unspecified: Secondary | ICD-10-CM | POA: Diagnosis not present

## 2014-06-03 DIAGNOSIS — IMO0002 Reserved for concepts with insufficient information to code with codable children: Secondary | ICD-10-CM

## 2014-06-03 DIAGNOSIS — E1165 Type 2 diabetes mellitus with hyperglycemia: Secondary | ICD-10-CM | POA: Diagnosis not present

## 2014-06-03 MED ORDER — INSULIN DETEMIR 100 UNIT/ML FLEXPEN: 22.0000 [IU] | PEN_INJECTOR | Freq: Every day | SUBCUTANEOUS | Status: DC

## 2014-06-03 NOTE — Progress Notes (Signed)
[name] 81 y.o.            Reason for Appointment: Diabetes follow-up   History of Present Illness   Diagnosis: Type 2 DIABETES MELITUS, date of diagnosis:   2000   She has been on insulin since 2004 with difficulty controlling adequately because of compliance with diet and variability in blood sugars. Her previous records are not available at present, however she has been on basal insulin with mealtime coverage since at least 2007 She has been tried on Byetta previously but did not tolerate this well She tried Victoza but didn't not benefit from Victoza 0.6 dosage; had nausea from 1.2 mg; did not continue this because of this side effect  RECENT history:    Her blood control appears to be still fairly good with A1c 7.4 now Overall she has had improved response with continuing low dose Invokamet which she takes in the morning  Also on supplemental metformin ER which she takes in the evening. Unable to interpret the download of her glucose monitor today as it has the incorrect date She also is unable to remember her recent blood sugars She is also more compliant with taking her Humalog insulin at lunch compared to previously  She has the following blood sugar patterns and problems identified  Although her blood sugars in the mornings were low normal previously they appear to be fairly good by recall, she thinks they are slightly higher in the last couple of days.  She is not adjusting her Lantus arbitrarily and is taking 22 units which is slightly more than before  She has taken about the same amount of insulin for breakfast and lunch even though she may be having different types of foods on carbohydrates She has had occasional low blood sugars late in the evening possibly from taking excessive Humalog: However last night she thinks she got low after that time even though she did not eat or take Humalog  She is trying to do some walking and weight is slightly better  Oral  hypoglycemic drugs: Metformin 750 mg at supper, Invokamet 50/1000 in AM    .  INSULIN: Lantus 22 units daily, HUMALOG 6 acb, 6 at lunch 4 at supper recently     Proper timing of medications in relation to meals: not at lunch or supper at times.          Monitors blood glucose:  3-4 times daily     Glucometer: One Touch.          Blood Glucose readings difficult to interpret from download since date is incorrect  Meals: 3 meals per day:  lunch 12-2 pm, dinner 5 pm Physical activity: exercise: walks upto 30 min; 3-7 /7 days  Wt Readings from Last 3 Encounters:  06/03/14 182 lb 12.8 oz (82.918 kg)  04/06/14 182 lb 1 oz (82.583 kg)  03/16/14 184 lb (83.462 kg)    Lab Results  Component Value Date   HGBA1C 7.4* 05/13/2014   HGBA1C 7.5* 01/09/2014   HGBA1C 7.7* 09/25/2013   Lab Results  Component Value Date   MICROALBUR 8.8* 06/02/2013   LDLCALC 102* 05/13/2014   CREATININE 0.9 03/09/2014    No visits with results within 1 Week(s) from this visit. Latest known visit with results is:  Appointment on 05/13/2014  Component Date Value Ref Range Status  . Hgb A1c MFr Bld 05/13/2014 7.4* 4.6 - 6.5 % Final   Glycemic Control Guidelines for People with Diabetes:Non Diabetic:  <6%Goal  of Therapy: <7%Additional Action Suggested:  >8%   . Glucose, Bld 05/13/2014 150* 70 - 99 mg/dL Final  . Cholesterol 05/13/2014 170  0 - 200 mg/dL Final   ATP III Classification       Desirable:  < 200 mg/dL               Borderline High:  200 - 239 mg/dL          High:  > = 240 mg/dL  . Triglycerides 05/13/2014 71.0  0.0 - 149.0 mg/dL Final   Normal:  <150 mg/dLBorderline High:  150 - 199 mg/dL  . HDL 05/13/2014 53.80  >39.00 mg/dL Final  . VLDL 05/13/2014 14.2  0.0 - 40.0 mg/dL Final  . LDL Cholesterol 05/13/2014 102* 0 - 99 mg/dL Final  . Total CHOL/HDL Ratio 05/13/2014 3   Final                  Men          Women1/2 Average Risk     3.4          3.3Average Risk          5.0          4.42X Average Risk           9.6          7.13X Average Risk          15.0          11.0                      . NonHDL 05/13/2014 116.20   Final   NOTE:  Non-HDL goal should be 30 mg/dL higher than patient's LDL goal (i.e. LDL goal of < 70 mg/dL, would have non-HDL goal of < 100 mg/dL)       Medication List       This list is accurate as of: 06/03/14  1:07 PM.  Always use your most recent med list.               aspirin EC 81 MG tablet  Take 1 tablet (81 mg total) by mouth daily.     benazepril 20 MG tablet  Commonly known as:  LOTENSIN  Take 1 tablet (20 mg total) by mouth daily.     Canagliflozin-Metformin HCl 50-1000 MG Tabs  Commonly known as:  INVOKAMET  Take 1 tablet twice daily     carvedilol 12.5 MG tablet  Commonly known as:  COREG  TAKE 1 TABLET (12.5 MG TOTAL) BY MOUTH 2 (TWO) TIMES DAILY.     cholecalciferol 1000 UNITS tablet  Commonly known as:  VITAMIN D  Take 1,000 Units by mouth daily.     Co Q-10 100 MG Caps  Take 1 capsule by mouth 2 (two) times daily.     CRESTOR 20 MG tablet  Generic drug:  rosuvastatin  TAKE 1 TABLET (20 MG TOTAL) BY MOUTH EVERY EVENING.     felodipine 10 MG 24 hr tablet  Commonly known as:  PLENDIL  Take 1 tablet (10 mg total) by mouth daily.     Insulin Detemir 100 UNIT/ML Pen  Commonly known as:  LEVEMIR  Inject 22 Units into the skin daily before breakfast.     insulin lispro 100 UNIT/ML KiwkPen  Commonly known as:  HUMALOG  Inject 0.06-0.08 mLs (6-8 Units total) into the skin 2 (two) times daily.     LANTUS SOLOSTAR 100  UNIT/ML Solostar Pen  Generic drug:  Insulin Glargine  22 UNITS IN THE MORNING SUBCUTANEOUS ONCE A DAY     levothyroxine 75 MCG tablet  Commonly known as:  SYNTHROID, LEVOTHROID  TAKE 1 TABLET (75 MCG TOTAL) BY MOUTH DAILY.     metFORMIN 750 MG 24 hr tablet  Commonly known as:  GLUCOPHAGE-XR  Take 1 tablet (750 mg total) by mouth 2 (two) times daily.     multivitamins ther. w/minerals Tabs tablet  Take 1 tablet by  mouth daily.     TUMS 500 MG chewable tablet  Generic drug:  calcium carbonate  Chew 1 tablet by mouth daily as needed for heartburn (indigestion).        Allergies: No Known Allergies  Past Medical History  Diagnosis Date  . Thyroid disease   . Diabetes mellitus   . Lumbago   . Aortic valve disorders     S/P AVR 06/2012  . Hyperlipidemia   . Heart murmur   . Hypertension   . Hypothyroidism   . Depression   . Shortness of breath   . Asthma     AS CHILD   . Arthritis     Past Surgical History  Procedure Laterality Date  . Tubal ligation    . Breast surgery      LT BREAST BX BENIGN  . Aortic valve replacement N/A 06/25/2012    Procedure: AORTIC VALVE REPLACEMENT (AVR);  Surgeon: Grace Isaac, MD;  Location: [facility];  Service: Open Heart Surgery;  Laterality: N/A;  . Coronary artery bypass graft N/A 06/25/2012    Procedure: Coronary artery bypass graft  times two using left internal mammary artery and right leg greater saphenous vein;  Surgeon: Grace Isaac, MD;  Location: [facility];  Service: Open Heart Surgery;  Laterality: N/A;  . Intraoperative transesophageal echocardiogram N/A 06/25/2012    Procedure: INTRAOPERATIVE TRANSESOPHAGEAL ECHOCARDIOGRAM;  Surgeon: Grace Isaac, MD;  Location: [facility];  Service: Open Heart Surgery;  Laterality: N/A;    Family History  Problem Relation Age of Onset  . Heart disease Father     Social History:  reports that she has never smoked. She does not have any smokeless tobacco history on file. She reports that she does not drink alcohol or use illicit drugs.  Review of Systems:   HYPERTENSION: taking multiple drugs and blood pressure is well controlled  HYPOTHYROIDISM: She has had long-standing hypothyroidism  Her dosage has been changed periodically. She has had normal TSH level with reducing her dose to 75 g  Lab Results  Component Value Date   TSH 2.81 03/11/2014   TSH 0.22* 01/09/2014   TSH 0.56 09/25/2013    FREET4 1.07 03/11/2014   FREET4 1.01 09/25/2013   FREET4 1.40 07/28/2013     HYPERLIPIDEMIA: The lipid abnormality consists of elevated LDL; has been variably controlled with Crestor 20 mg. Being managed by PCP.   LDL  borderline high  Lab Results  Component Value Date   CHOL 170 05/13/2014   HDL 53.80 05/13/2014   LDLCALC 102* 05/13/2014   LDLDIRECT 102.8 11/28/2013   TRIG 71.0 05/13/2014   CHOLHDL 3 05/13/2014  '       Examination:   BP 118/62 mmHg  Pulse 61  Temp(Src) 98.4 F (36.9 C) (Oral)  Resp 12  Wt 182 lb 12.8 oz (82.918 kg)  SpO2 96%  Body mass index is 31.87 kg/(m^2).     ASSESSMENT/ PLAN:   Diabetes type 2, uncontrolled  See history of present illness for discussion on her recent management and problems identified Although her A1c is still over 7% it is relatively good for her She is not having excessive hypoglycemia either Currently unable to analyze her blood sugar patterns at home is her monitor has been correct date and time but most likely her blood sugars are fairly good throughout the day except for sporadic high readings after breakfast or lunch Overall compliance is fairly good with diet, exercise and medications The patient's diabetes control is improved with using low-dose Invokana and encouraged her to continue this and ignore advertising on TV from lawyers She will call if blood sugars are not consistently controlled    [name] 06/03/2014, 1:07 PM

## 2014-06-04 ENCOUNTER — Ambulatory Visit (INDEPENDENT_AMBULATORY_CARE_PROVIDER_SITE_OTHER): Payer: Medicare Other | Admitting: Podiatrist

## 2014-06-04 DIAGNOSIS — B351 Tinea unguium: Secondary | ICD-10-CM | POA: Diagnosis not present

## 2014-06-04 DIAGNOSIS — M79676 Pain in unspecified toe(s): Secondary | ICD-10-CM | POA: Diagnosis not present

## 2014-06-04 NOTE — Patient Instructions (Signed)
Diabetes and Foot Care Diabetes may cause you to have problems because of poor blood supply (circulation) to your feet and legs. This may cause the skin on your feet to become thinner, break easier, and heal more slowly. Your skin may become dry, and the skin may peel and crack. You may also have nerve damage in your legs and feet causing decreased feeling in them. You may not notice minor injuries to your feet that could lead to infections or more serious problems. Taking care of your feet is one of the most important things you can do for yourself.  HOME CARE INSTRUCTIONS  Wear shoes at all times, even in the house. Do not go barefoot. Bare feet are easily injured.  Check your feet daily for blisters, cuts, and redness. If you cannot see the bottom of your feet, use a mirror or ask someone for help.  Wash your feet with warm water (do not use hot water) and mild soap. Then pat your feet and the areas between your toes until they are completely dry. Do not soak your feet as this can dry your skin.  Apply a moisturizing lotion or petroleum jelly (that does not contain alcohol and is unscented) to the skin on your feet and to dry, brittle toenails. Do not apply lotion between your toes.  Trim your toenails straight across. Do not dig under them or around the cuticle. File the edges of your nails with an emery board or nail file.  Do not cut corns or calluses or try to remove them with medicine.  Wear clean socks or stockings every day. Make sure they are not too tight. Do not wear knee-high stockings since they may decrease blood flow to your legs.  Wear shoes that fit properly and have enough cushioning. To break in new shoes, wear them for just a few hours a day. This prevents you from injuring your feet. Always look in your shoes before you put them on to be sure there are no objects inside.  Do not cross your legs. This may decrease the blood flow to your feet.  If you find a minor scrape,  cut, or break in the skin on your feet, keep it and the skin around it clean and dry. These areas may be cleansed with mild soap and water. Do not cleanse the area with peroxide, alcohol, or iodine.  When you remove an adhesive bandage, be sure not to damage the skin around it.  If you have a wound, look at it several times a day to make sure it is healing.  Do not use heating pads or hot water bottles. They may burn your skin. If you have lost feeling in your feet or legs, you may not know it is happening until it is too late.  Make sure your health care provider performs a complete foot exam at least annually or more often if you have foot problems. Report any cuts, sores, or bruises to your health care provider immediately. SEEK MEDICAL CARE IF:   You have an injury that is not healing.  You have cuts or breaks in the skin.  You have an ingrown nail.  You notice redness on your legs or feet.  You feel burning or tingling in your legs or feet.  You have pain or cramps in your legs and feet.  Your legs or feet are numb.  Your feet always feel cold. SEEK IMMEDIATE MEDICAL CARE IF:   There is increasing redness,   swelling, or pain in or around a wound.  There is a red line that goes up your leg.  Pus is coming from a wound.  You develop a fever or as directed by your health care provider.  You notice a bad smell coming from an ulcer or wound. Document Released: 04/14/2000 Document Revised: 12/18/2012 Document Reviewed: 09/24/2012 Exit[employer] Patient Information 2015 [employer]. This information is not intended to replace advice given to you by your health care provider. Make sure you discuss any questions you have with your health care provider.  

## 2014-06-16 DIAGNOSIS — E119 Type 2 diabetes mellitus without complications: Secondary | ICD-10-CM | POA: Diagnosis not present

## 2014-06-16 DIAGNOSIS — R413 Other amnesia: Secondary | ICD-10-CM | POA: Diagnosis not present

## 2014-06-16 DIAGNOSIS — I10 Essential (primary) hypertension: Secondary | ICD-10-CM | POA: Diagnosis not present

## 2014-06-16 DIAGNOSIS — F419 Anxiety disorder, unspecified: Secondary | ICD-10-CM | POA: Diagnosis not present

## 2014-06-21 NOTE — Progress Notes (Signed)
HPI: Patient presents today for follow up of diabetic foot and nail care. Past medical history, meds, and allergies reviewed. Patient states blood sugar is under good control. She denies numbness or tingling.  Objective: Objective: Patients chart is reviewed. Vascular status reveals pedal pulses noted at 2 out of 4 dp and pt bilateral . Neurological sensation is Normal to [employer] monofilament bilateral at 5/5 sites bilateral. Dermatological exam reveals absence of pre ulcerative/ hyperkeratotic lesions. Toenails are elongated, incurvated, discolored, dystrophic with ingrown deformity present. Hammertoe right 2nd digit is present   Assessment: Diabetes , hammertoe, painful mycotic nails   Plan: Discussed treatment options and alternatives. Debrided nails without complication. eturn appointment recommended at routine intervals of 3 months

## 2014-06-23 DIAGNOSIS — H01002 Unspecified blepharitis right lower eyelid: Secondary | ICD-10-CM | POA: Diagnosis not present

## 2014-06-23 DIAGNOSIS — H01004 Unspecified blepharitis left upper eyelid: Secondary | ICD-10-CM | POA: Diagnosis not present

## 2014-06-23 DIAGNOSIS — H01005 Unspecified blepharitis left lower eyelid: Secondary | ICD-10-CM | POA: Diagnosis not present

## 2014-06-23 DIAGNOSIS — H04123 Dry eye syndrome of bilateral lacrimal glands: Secondary | ICD-10-CM | POA: Diagnosis not present

## 2014-06-23 DIAGNOSIS — H01001 Unspecified blepharitis right upper eyelid: Secondary | ICD-10-CM | POA: Diagnosis not present

## 2014-06-26 DIAGNOSIS — H2511 Age-related nuclear cataract, right eye: Secondary | ICD-10-CM | POA: Diagnosis not present

## 2014-07-16 ENCOUNTER — Ambulatory Visit (INDEPENDENT_AMBULATORY_CARE_PROVIDER_SITE_OTHER): Payer: Medicare Other | Admitting: Cardiovascular Disease

## 2014-07-16 ENCOUNTER — Encounter: Payer: Self-pay | Admitting: Cardiovascular Disease

## 2014-07-16 VITALS — BP 114/70 | HR 55 | Ht 63.5 in | Wt 184.0 lb

## 2014-07-16 DIAGNOSIS — I10 Essential (primary) hypertension: Secondary | ICD-10-CM | POA: Diagnosis not present

## 2014-07-16 NOTE — Progress Notes (Signed)
Cardiology Office Note   Date:  07/16/2014   ID:  [name], DOB 1933/02/13, MRN [id number]  PCP:  Kevan Ny, MD  Cardiologist:   Thayer Headings, MD   Chief Complaint  Patient presents with  . Follow-up    HTN   1. CAD - s/p CABG 2-27 14  2. Aortic stenosis - s/p 21 mm pericardial aortic valve 3. Post op atrial fibrillation 4. HTN   History of Present Illness: [name] is seen back today for a 2 week check. She has had recent AVR and CABG x 2 per Dr. Servando Snare. This was with a LIMA to the LAD and SVG to the LCX, with a pericardial Edwards tissue valve #21 mm. Did have post op atrial fib - on amiodarone. Other issues include past AS, DM, HLD, HTN and hypothryoidism.   I saw her 2 weeks ago. I gave her a short course of Lasix for some volume overload. We also stopped her amiodarone due to prolonged QT.   She comes back today. She is here alone. Weight is down 7 pounds. She is doing well. Feeling better and stronger. Still with some pedal edema - says she did not have this prior to her surgery. Still taking 40 mg of Lasix. Walking in her house. Little scared about venturing outside. Ready to go to cardiac rehab. Not dizzy or lightheaded. No shortness of breath.   Sep 12, 2012:  [name] is doing well after her CABG and AVR. She has a swollen area at one of the SVG harvest sites. No redness. Mildly tender.   She has had some post op anemia. Her potassium has been low.   Oct. 24, 2014:  [name] is doing well. She is recovering well. She needs to get out and exercise.   Feb. 5, 2015:  [name] has not been feeling well. She has fatigue and dyspnea. She also has chest fullness , gas / indigestion with even small amounts of exertion. these symptoms are associated with diaphoresis. She tries to get out and walk every day. She had CABG and AVR 1 year ago. An echocardiogram several months after her CA BG / AVR showed a normal prosthetic AV ( 21 mm  bioprosthetic) She felt well for several months after surgery and then started going down hill.   Sep 10, 2013:  Pt is doing OK. She has some fatigue at times. Walks on occasion  Nov. 9, 2015:  [name] is doing well. She has history of coronary artery bypass grafting and aortic valve replacement and debris, 2014. Chest postop atrial fibrillation that was treated with amiodarone for short term. She has a history of diabetes mellitus, hyperlipidemia, hypertension, and hypothyroidism.  She has lots of fatigue. Thyroid meds have been adjusted.     July 16, 2014:  [name] is a 81 y.o. female who presents for follow up of her HTN. Has some DOE on occasion.  She does not get much exercise.  She walks around at [facility] 3 times a week.  She is unsteady on her feet    Past Medical History  Diagnosis Date  . Thyroid disease   . Diabetes mellitus   . Lumbago   . Aortic valve disorders     S/P AVR 06/2012  . Hyperlipidemia   . Heart murmur   . Hypertension   . Hypothyroidism   . Depression   . Shortness of breath   . Asthma  AS CHILD   . Arthritis     Past Surgical History  Procedure Laterality Date  . Tubal ligation    . Breast surgery      LT BREAST BX BENIGN  . Aortic valve replacement N/A 06/25/2012    Procedure: AORTIC VALVE REPLACEMENT (AVR);  Surgeon: Grace Isaac, MD;  Location: [facility];  Service: Open Heart Surgery;  Laterality: N/A;  . Coronary artery bypass graft N/A 06/25/2012    Procedure: Coronary artery bypass graft  times two using left internal mammary artery and right leg greater saphenous vein;  Surgeon: Grace Isaac, MD;  Location: [facility];  Service: Open Heart Surgery;  Laterality: N/A;  . Intraoperative transesophageal echocardiogram N/A 06/25/2012    Procedure: INTRAOPERATIVE TRANSESOPHAGEAL ECHOCARDIOGRAM;  Surgeon: Grace Isaac, MD;  Location: [facility];  Service: Open Heart Surgery;  Laterality: N/A;     Current Outpatient  Prescriptions  Medication Sig Dispense Refill  . aspirin EC 81 MG tablet Take 1 tablet (81 mg total) by mouth daily.    . benazepril (LOTENSIN) 20 MG tablet Take 1 tablet (20 mg total) by mouth daily. (Patient taking differently: Take 10 mg by mouth daily. ) 30 tablet   . calcium carbonate (TUMS) 500 MG chewable tablet Chew 1 tablet by mouth daily as needed for heartburn (indigestion).    . Canagliflozin-Metformin HCl (INVOKAMET) 50-1000 MG TABS Take 1 tablet twice daily 60 tablet 3  . carvedilol (COREG) 12.5 MG tablet TAKE 1 TABLET (12.5 MG TOTAL) BY MOUTH 2 (TWO) TIMES DAILY. 60 tablet 6  . cholecalciferol (VITAMIN D) 1000 UNITS tablet Take 1,000 Units by mouth daily.    . Coenzyme Q10 (CO Q-10) 100 MG CAPS Take 1 capsule by mouth 2 (two) times daily.    . CRESTOR 20 MG tablet TAKE 1 TABLET (20 MG TOTAL) BY MOUTH EVERY EVENING. 90 tablet 3  . felodipine (PLENDIL) 10 MG 24 hr tablet Take 1 tablet (10 mg total) by mouth daily. 90 tablet 3  . Insulin Detemir (LEVEMIR) 100 UNIT/ML Pen Inject 22 Units into the skin daily before breakfast. 15 mL 11  . insulin lispro (HUMALOG) 100 UNIT/ML KiwkPen Inject 0.06-0.08 mLs (6-8 Units total) into the skin 2 (two) times daily. (Patient taking differently: Inject 4-6 Units into the skin 2 (two) times daily. ) 15 mL 3  . levothyroxine (SYNTHROID, LEVOTHROID) 75 MCG tablet TAKE 1 TABLET (75 MCG TOTAL) BY MOUTH DAILY. 30 tablet 3  . metFORMIN (GLUCOPHAGE-XR) 750 MG 24 hr tablet Take 1 tablet (750 mg total) by mouth 2 (two) times daily. (Patient taking differently: Take 750 mg by mouth daily. ) 120 tablet 5  . Multiple Vitamins-Minerals (MULTIVITAMINS THER. W/MINERALS) TABS Take 1 tablet by mouth daily.     No current facility-administered medications for this visit.    Allergies:   Review of patient's allergies indicates no known allergies.    Social History:  The patient  reports that she has never smoked. She does not have any smokeless tobacco history on  file. She reports that she does not drink alcohol or use illicit drugs.   Family History:  The patient's family history includes Heart disease in her father.    ROS:  Please see the history of present illness.    Review of Systems: Constitutional:  denies fever, chills, diaphoresis, appetite change and fatigue.  HEENT: denies photophobia, eye pain, redness, hearing loss, ear pain, congestion, sore throat, rhinorrhea, sneezing, neck pain, neck stiffness and tinnitus.  Respiratory: denies SOB, DOE, cough, chest tightness, and wheezing.  Cardiovascular: denies chest pain, palpitations and leg swelling.  Gastrointestinal: denies nausea, vomiting, abdominal pain, diarrhea, constipation, blood in stool.  Genitourinary: denies dysuria, urgency, frequency, hematuria, flank pain and difficulty urinating.  Musculoskeletal: denies  myalgias, back pain, joint swelling, arthralgias and gait problem.   Skin: denies pallor, rash and wound.  Neurological: denies dizziness, seizures, syncope, weakness, light-headedness, numbness and headaches.   Hematological: denies adenopathy, easy bruising, personal or family bleeding history.  Psychiatric/ Behavioral: denies suicidal ideation, mood changes, confusion, nervousness, sleep disturbance and agitation.       All other systems are reviewed and negative.    PHYSICAL EXAM: VS:  BP 114/70 mmHg  Pulse 55  Ht 5' 3.5" (1.613 m)  Wt 184 lb (83.462 kg)  BMI 32.08 kg/m2 , BMI Body mass index is 32.08 kg/(m^2). GEN: Well nourished, well developed, in no acute distress HEENT: normal Neck: no JVD, carotid bruits, or masses Cardiac: RRR; no murmurs, rubs, or gallops,no edema  Respiratory:  clear to auscultation bilaterally, normal work of breathing GI: soft, nontender, nondistended, + BS MS: no deformity or atrophy Skin: warm and dry, no rash Neuro:  Strength and sensation are intact Psych: normal   EKG:  EKG is ordered today. The ekg ordered today  demonstrates sinus brady at 55.  Poor R wave progression    Recent Labs: 03/09/2014: ALT 13; BUN 17; Creatinine 0.9; Potassium 3.9; Sodium 140 03/11/2014: TSH 2.81    Lipid Panel    Component Value Date/Time   CHOL 170 05/13/2014 0934   TRIG 71.0 05/13/2014 0934   HDL 53.80 05/13/2014 0934   CHOLHDL 3 05/13/2014 0934   VLDL 14.2 05/13/2014 0934   LDLCALC 102* 05/13/2014 0934   LDLDIRECT 102.8 11/28/2013 0827      Wt Readings from Last 3 Encounters:  07/16/14 184 lb (83.462 kg)  06/03/14 182 lb 12.8 oz (82.918 kg)  04/06/14 182 lb 1 oz (82.583 kg)      Other studies Reviewed: Additional studies/ records that were reviewed today include: . Review of the above records demonstrates:    ASSESSMENT AND PLAN:  1. CAD - s/p CABG 2-27 14 -   She is doing well. No episodes of chest discomfort. Continue current medications. 2. Aortic stenosis - s/p 21 mm pericardial aortic valve 3. Post op atrial fibrillation 4. HTN - her blood pressures well-controlled. I'll see her again in one year.   Current medicines are reviewed at length with the patient today.  The patient does not have concerns regarding medicines.  The following changes have been made:  no change  Labs/ tests ordered today include:  No orders of the defined types were placed in this encounter.     Disposition:   FU with me in 1 year    Signed, Aviraj Kentner, Wonda Cheng, MD  07/16/2014 10:41 AM    [address], [place], [place]  [address] Phone: [phone]; Fax: [phone]

## 2014-07-16 NOTE — Patient Instructions (Signed)
Your physician recommends that you continue on your current medications as directed. Please refer to the Current Medication list given to you today.  Your physician wants you to follow-up in: 1 year with Dr. Nahser.  You will receive a reminder letter in the mail two months in advance. If you don't receive a letter, please call our office to schedule the follow-up appointment.  

## 2014-07-20 ENCOUNTER — Telehealth: Payer: Self-pay | Admitting: Endocrinology

## 2014-07-20 ENCOUNTER — Other Ambulatory Visit: Payer: Self-pay | Admitting: *Deleted

## 2014-07-20 MED ORDER — INSULIN PEN NEEDLE 32G X 4 MM MISC: Status: DC

## 2014-07-20 NOTE — Telephone Encounter (Signed)
Call pt per pt over some medication this is all the information she would give me call back @  [phone]

## 2014-07-20 NOTE — Telephone Encounter (Signed)
Pt is now home and can call her at the home #

## 2014-07-21 ENCOUNTER — Other Ambulatory Visit: Payer: Self-pay | Admitting: *Deleted

## 2014-07-21 MED ORDER — INSULIN LISPRO 100 UNIT/ML (KWIKPEN): 6.0000 [IU] | PEN_INJECTOR | Freq: Two times a day (BID) | SUBCUTANEOUS | Status: DC

## 2014-07-27 DIAGNOSIS — H25811 Combined forms of age-related cataract, right eye: Secondary | ICD-10-CM | POA: Diagnosis not present

## 2014-07-27 DIAGNOSIS — H2511 Age-related nuclear cataract, right eye: Secondary | ICD-10-CM | POA: Diagnosis not present

## 2014-08-06 DIAGNOSIS — H2512 Age-related nuclear cataract, left eye: Secondary | ICD-10-CM | POA: Diagnosis not present

## 2014-08-17 DIAGNOSIS — H25812 Combined forms of age-related cataract, left eye: Secondary | ICD-10-CM | POA: Diagnosis not present

## 2014-08-17 DIAGNOSIS — H2512 Age-related nuclear cataract, left eye: Secondary | ICD-10-CM | POA: Diagnosis not present

## 2014-08-19 ENCOUNTER — Other Ambulatory Visit: Payer: Self-pay | Admitting: Endocrinology

## 2014-08-28 ENCOUNTER — Other Ambulatory Visit (INDEPENDENT_AMBULATORY_CARE_PROVIDER_SITE_OTHER): Payer: Medicare Other

## 2014-08-28 DIAGNOSIS — E1165 Type 2 diabetes mellitus with hyperglycemia: Secondary | ICD-10-CM

## 2014-08-28 DIAGNOSIS — IMO0002 Reserved for concepts with insufficient information to code with codable children: Secondary | ICD-10-CM

## 2014-08-28 LAB — COMPREHENSIVE METABOLIC PANEL
ALBUMIN: 4.1 g/dL (ref 3.5–5.2)
ALK PHOS: 40 U/L (ref 39–117)
ALT: 10 U/L (ref 0–35)
AST: 17 U/L (ref 0–37)
BILIRUBIN TOTAL: 0.5 mg/dL (ref 0.2–1.2)
BUN: 19 mg/dL (ref 6–23)
CHLORIDE: 100 meq/L (ref 96–112)
CO2: 29 mEq/L (ref 19–32)
Calcium: 10.1 mg/dL (ref 8.4–10.5)
Creatinine, Ser: 0.94 mg/dL (ref 0.40–1.20)
GFR: 73.79 mL/min (ref >60.00)
Glucose, Bld: 198 mg/dL — ABNORMAL HIGH (ref 70–99)
Potassium: 4 mEq/L (ref 3.5–5.1)
Sodium: 136 mEq/L (ref 135–145)
TOTAL PROTEIN: 7.7 g/dL (ref 6.0–8.3)

## 2014-08-28 LAB — URINALYSIS, ROUTINE W REFLEX MICROSCOPIC
Bilirubin Urine: NEGATIVE
Ketones, ur: NEGATIVE
Leukocytes, UA: NEGATIVE
Nitrite: NEGATIVE
Specific Gravity, Urine: 1.01 (ref 1.000–1.030)
UROBILINOGEN UA: 0.2 (ref 0.0–1.0)
pH: 6 (ref 5.0–8.0)

## 2014-08-28 LAB — MICROALBUMIN / CREATININE URINE RATIO
Creatinine,U: 49.1 mg/dL
Microalb Creat Ratio: 31 mg/g — ABNORMAL HIGH (ref 0.0–30.0)
Microalb, Ur: 15.2 mg/dL — ABNORMAL HIGH (ref 0.0–1.9)

## 2014-08-28 LAB — HEMOGLOBIN A1C: Hgb A1c MFr Bld: 7.5 % — ABNORMAL HIGH (ref 4.6–6.5)

## 2014-09-01 ENCOUNTER — Ambulatory Visit (INDEPENDENT_AMBULATORY_CARE_PROVIDER_SITE_OTHER): Payer: Medicare Other | Admitting: Endocrinology

## 2014-09-01 ENCOUNTER — Encounter: Payer: Self-pay | Admitting: Endocrinology

## 2014-09-01 VITALS — BP 126/62 | HR 54 | Temp 98.2°F | Resp 14 | Ht 63.5 in | Wt 179.6 lb

## 2014-09-01 DIAGNOSIS — I10 Essential (primary) hypertension: Secondary | ICD-10-CM

## 2014-09-01 DIAGNOSIS — E1165 Type 2 diabetes mellitus with hyperglycemia: Secondary | ICD-10-CM | POA: Diagnosis not present

## 2014-09-01 DIAGNOSIS — IMO0002 Reserved for concepts with insufficient information to code with codable children: Secondary | ICD-10-CM

## 2014-09-01 DIAGNOSIS — E039 Hypothyroidism, unspecified: Secondary | ICD-10-CM

## 2014-09-01 NOTE — Progress Notes (Signed)
[name] 81 y.o.            Reason for Appointment: Diabetes follow-up   History of Present Illness   Diagnosis: Type 2 DIABETES MELITUS, date of diagnosis:   2000   She has been on insulin since 2004 with difficulty controlling adequately because of compliance with diet and variability in blood sugars. Her previous records are not available at present, however she has been on basal insulin with mealtime coverage since at least 2007 She has been tried on Byetta previously but did not tolerate this well She tried Victoza but didn't not benefit from Victoza 0.6 dosage; had nausea from 1.2 mg; did not continue this because of this side effect  RECENT history:   INSULIN: Lantus 22 units daily, HUMALOG 6 acb, 6 at lunch 4 at supper recently   Her blood control appears to be fairly stable overall with A1c 7.5 Overall she has had improved  Control with continuing low dose Invokamet which she takes in the morning  Also on supplemental metformin ER which she takes in the evening.  However she is now complaining about recurrent yeast infections  With taking the Invokamet  She has the following blood sugar patterns and problems identified  Although her blood sugars in the mornings are fairly good they are quite variable and the rest of the day  She has persistently high readings postprandially in the afternoon with readings as high as 319  She again has been totally noncompliant with taking her Humalog before eating lunch and she is eating out fairly consistently  Also the few readings she has after breakfast appear to be relatively high  Blood sugars are sometimes low normal or slightly low around supper time if she takes her lunchtime Humalog late in the day  She has only a few readings after supper and these are usually fairly good; generally eating small meals in the evening  Hypoglycemia: Lowest reading 62 with taking her Humalog late in the afternoon  She is trying to do  Regular walking and weight is slightly better  Oral hypoglycemic drugs: Metformin 750 mg at supper, Invokamet 50/1000 in AM     .  Proper timing of medications in relation to meals: not at lunch with her Humalog.          Monitors blood glucose:  2-3 times daily     Glucometer: One Touch.          Blood Glucose readings   PRE-MEAL Breakfast Lunch PC lunch PCS Overall  Glucose range: 98-169 234, 299 145-319 89-189   median: 128  210  145   Meals: 3 meals per day: recently breakfast is mostly peanut butter or cheese crackers lunch 12-2 pm, dinner 5 pm Physical activity: exercise: walks upto 30 min; 3-7  days  Wt Readings from Last 3 Encounters:  09/01/14 179 lb 9.6 oz (81.466 kg)  07/16/14 184 lb (83.462 kg)  06/03/14 182 lb 12.8 oz (82.918 kg)    Lab Results  Component Value Date   HGBA1C 7.5* 08/28/2014   HGBA1C 7.4* 05/13/2014   HGBA1C 7.5* 01/09/2014   Lab Results  Component Value Date   MICROALBUR 15.2* 08/28/2014   LDLCALC 102* 05/13/2014   CREATININE 0.94 08/28/2014    Lab on 08/28/2014  Component Date Value Ref Range Status  . Hgb A1c MFr Bld 08/28/2014 7.5* 4.6 - 6.5 % Final   Glycemic Control Guidelines for People with Diabetes:Non Diabetic:  <6%Goal of Therapy: <7%Additional Action  Suggested:  >8%   . Sodium 08/28/2014 136  135 - 145 mEq/L Final  . Potassium 08/28/2014 4.0  3.5 - 5.1 mEq/L Final  . Chloride 08/28/2014 100  96 - 112 mEq/L Final  . CO2 08/28/2014 29  19 - 32 mEq/L Final  . Glucose, Bld 08/28/2014 198* 70 - 99 mg/dL Final  . BUN 08/28/2014 19  6 - 23 mg/dL Final  . Creatinine, Ser 08/28/2014 0.94  0.40 - 1.20 mg/dL Final  . Total Bilirubin 08/28/2014 0.5  0.2 - 1.2 mg/dL Final  . Alkaline Phosphatase 08/28/2014 40  39 - 117 U/L Final  . AST 08/28/2014 17  0 - 37 U/L Final  . ALT 08/28/2014 10  0 - 35 U/L Final  . Total Protein 08/28/2014 7.7  6.0 - 8.3 g/dL Final  . Albumin 08/28/2014 4.1  3.5 - 5.2 g/dL Final  . Calcium 08/28/2014 10.1  8.4  - 10.5 mg/dL Final  . GFR 08/28/2014 73.79  >60.00 mL/min Final  . Microalb, Ur 08/28/2014 15.2* 0.0 - 1.9 mg/dL Final  . Creatinine,U 08/28/2014 49.1   Final  . Microalb Creat Ratio 08/28/2014 31.0* 0.0 - 30.0 mg/g Final  . Color, Urine 08/28/2014 YELLOW  Yellow;Lt. Yellow Final  . APPearance 08/28/2014 CLEAR  Clear Final  . Specific Gravity, Urine 08/28/2014 1.010  1.000-1.030 Final  . pH 08/28/2014 6.0  5.0 - 8.0 Final  . Total Protein, Urine 08/28/2014 TRACE* Negative Final  . Urine Glucose 08/28/2014 >=1000* Negative Final   Results faxed to site/floor on 08/28/2014 10:06 AM by Delorise Jackson.  Marland Kitchen Ketones, ur 08/28/2014 NEGATIVE  Negative Final  . Bilirubin Urine 08/28/2014 NEGATIVE  Negative Final  . Hgb urine dipstick 08/28/2014 TRACE-INTACT* Negative Final  . Urobilinogen, UA 08/28/2014 0.2  0.0 - 1.0 Final  . Leukocytes, UA 08/28/2014 NEGATIVE  Negative Final  . Nitrite 08/28/2014 NEGATIVE  Negative Final  . WBC, UA 08/28/2014 7-10/hpf* 0-2/hpf Final  . RBC / HPF 08/28/2014 0-2/hpf  0-2/hpf Final  . Squamous Epithelial / LPF 08/28/2014 Rare(0-4/hpf)  Rare(0-4/hpf) Final  . Bacteria, UA 08/28/2014 Few(10-50/hpf)* None Final       Medication List       This list is accurate as of: 09/01/14  8:58 AM.  Always use your most recent med list.               aspirin EC 81 MG tablet  Take 1 tablet (81 mg total) by mouth daily.     benazepril 20 MG tablet  Commonly known as:  LOTENSIN  Take 1 tablet (20 mg total) by mouth daily.     carvedilol 12.5 MG tablet  Commonly known as:  COREG  TAKE 1 TABLET (12.5 MG TOTAL) BY MOUTH 2 (TWO) TIMES DAILY.     cholecalciferol 1000 UNITS tablet  Commonly known as:  VITAMIN D  Take 1,000 Units by mouth daily.     Co Q-10 100 MG Caps  Take 1 capsule by mouth 2 (two) times daily.     CRESTOR 20 MG tablet  Generic drug:  rosuvastatin  TAKE 1 TABLET (20 MG TOTAL) BY MOUTH EVERY EVENING.     felodipine 10 MG 24 hr tablet  Commonly  known as:  PLENDIL  Take 1 tablet (10 mg total) by mouth daily.     Insulin Detemir 100 UNIT/ML Pen  Commonly known as:  LEVEMIR  Inject 22 Units into the skin daily before breakfast.     insulin lispro 100 UNIT/ML  KiwkPen  Commonly known as:  HUMALOG  Inject 0.06-0.08 mLs (6-8 Units total) into the skin 2 (two) times daily.     Insulin Pen Needle 32G X 4 MM Misc  Commonly known as:  BD PEN NEEDLE NANO U/F  Use 3 per day     INVOKAMET 50-1000 MG Tabs  Generic drug:  Canagliflozin-Metformin HCl  TAKE 1 TABLET TWICE DAILY     levothyroxine 75 MCG tablet  Commonly known as:  SYNTHROID, LEVOTHROID  TAKE 1 TABLET (75 MCG TOTAL) BY MOUTH DAILY.     metFORMIN 750 MG 24 hr tablet  Commonly known as:  GLUCOPHAGE-XR  Take 1 tablet (750 mg total) by mouth 2 (two) times daily.     multivitamins ther. w/minerals Tabs tablet  Take 1 tablet by mouth daily.     TUMS 500 MG chewable tablet  Generic drug:  calcium carbonate  Chew 1 tablet by mouth daily as needed for heartburn (indigestion).        Allergies: No Known Allergies  Past Medical History  Diagnosis Date  . Thyroid disease   . Diabetes mellitus   . Lumbago   . Aortic valve disorders     S/P AVR 06/2012  . Hyperlipidemia   . Heart murmur   . Hypertension   . Hypothyroidism   . Depression   . Shortness of breath   . Asthma     AS CHILD   . Arthritis     Past Surgical History  Procedure Laterality Date  . Tubal ligation    . Breast surgery      LT BREAST BX BENIGN  . Aortic valve replacement N/A 06/25/2012    Procedure: AORTIC VALVE REPLACEMENT (AVR);  Surgeon: Grace Isaac, MD;  Location: [place];  Service: Open Heart Surgery;  Laterality: N/A;  . Coronary artery bypass graft N/A 06/25/2012    Procedure: Coronary artery bypass graft  times two using left internal mammary artery and right leg greater saphenous vein;  Surgeon: Grace Isaac, MD;  Location: [place];  Service: Open Heart Surgery;  Laterality:  N/A;  . Intraoperative transesophageal echocardiogram N/A 06/25/2012    Procedure: INTRAOPERATIVE TRANSESOPHAGEAL ECHOCARDIOGRAM;  Surgeon: Grace Isaac, MD;  Location: [place];  Service: Open Heart Surgery;  Laterality: N/A;    Family History  Problem Relation Age of Onset  . Heart disease Father     Social History:  reports that she has never smoked. She does not have any smokeless tobacco history on file. She reports that she does not drink alcohol or use illicit drugs.  Review of Systems:  Last Pneumovax: 06/26/05   HYPERTENSION: taking multiple drugs and blood pressure is well controlled  HYPOTHYROIDISM: She has had long-standing hypothyroidism  Her dosage has been changed periodically. She has had normal TSH level with reducing her dose to 75 g  Lab Results  Component Value Date   TSH 2.81 03/11/2014   TSH 0.22* 01/09/2014   TSH 0.56 09/25/2013   FREET4 1.07 03/11/2014   FREET4 1.01 09/25/2013   FREET4 1.40 07/28/2013     HYPERLIPIDEMIA: The lipid abnormality consists of elevated LDL; has been variably controlled with Crestor 20 mg. Being managed by PCP.   LDL  borderline high  Lab Results  Component Value Date   CHOL 170 05/13/2014   HDL 53.80 05/13/2014   LDLCALC 102* 05/13/2014   LDLDIRECT 102.8 11/28/2013   TRIG 71.0 05/13/2014   CHOLHDL 3 05/13/2014  '  Examination:   BP 126/62 mmHg  Pulse 54  Temp(Src) 98.2 F (36.8 C)  Resp 14  Ht 5' 3.5" (1.613 m)  Wt 179 lb 9.6 oz (81.466 kg)  BMI 31.31 kg/m2  SpO2 97%  Body mass index is 31.31 kg/(m^2).   No pedal edema  ASSESSMENT/ PLAN:   Diabetes type 2, uncontrolled   See history of present illness for discussion on her recent management and problems identified Although her A1c is still over 7% she has significantly high readings during the day recently This is mostly related to forgetting her Humalog at lunchtime and not taking insulin usually for breakfast She has been explained the  importance of taking Humalog before eating but recently has not been compliant at all She does appear to require relatively high amount of Humalog coverage in the morning despite eating only small amount of carbohydrate Currently she is reluctant to continue taking Invokana even with the low-dose because of recurrent yeast infections This has previously helped her control significantly and Her weight down as well as blood pressure is here to control  Discussed ways to improve her blood sugar control including better compliance with her mealtime doses and titrating the Humalog based her Postprandial readings She was also given the option of trying the V-go pump but she does not like the idea of having a device on her abdomen Discussed that she may need more insulin overall with stopping Invokana and she will let us know Also needs to be watching her diet especially when eating out  HYPERTENSION: Very well controlled but may need to full tablet of Lotensin if blood sugar goes up with stopping her Invokana  HYPOTHYROID: We'll need follow-up TSH on her next visit  PREVNAR: Discussed that she needs this and she will make sure she has not had this from PCP   Counseling time on subjects discussed above is over 50% of today's 25 minute visit   Merriam Woods 09/01/2014, 8:58 AM   Note: This office note was prepared with Estate agent. Any transcriptional errors that result from this process are unintentional.

## 2014-09-01 NOTE — Patient Instructions (Addendum)
HUMALOG 7 UNITS AT BFST AND LUNCH AND MUST TAKE before the meal  Sugar 2 hrs after meal should be <180  Can try metformin 2x daily and no Invokana  Keep walking

## 2014-09-02 ENCOUNTER — Telehealth: Payer: Self-pay | Admitting: Endocrinology

## 2014-09-02 NOTE — Telephone Encounter (Signed)
Noted, patient is aware. 

## 2014-09-02 NOTE — Telephone Encounter (Signed)
Pt called and wants rhonda to call her at home concerning her medications please call pt @ [phone]

## 2014-09-02 NOTE — Telephone Encounter (Signed)
Patient wants to know since you've taken her off the invokamet and had her increase her Metformin, does she need to start taking her fluid pill again?  Please advise

## 2014-09-02 NOTE — Telephone Encounter (Signed)
Only if she has swelling in her feet

## 2014-09-03 ENCOUNTER — Ambulatory Visit: Payer: Medicare Other | Admitting: Podiatrist

## 2014-09-08 ENCOUNTER — Other Ambulatory Visit: Payer: Self-pay | Admitting: Endocrinology

## 2014-09-14 ENCOUNTER — Ambulatory Visit: Payer: Medicare Other | Admitting: Cardiovascular Disease

## 2014-09-15 DIAGNOSIS — H9041 Sensorineural hearing loss, unilateral, right ear, with unrestricted hearing on the contralateral side: Secondary | ICD-10-CM | POA: Diagnosis not present

## 2014-09-15 DIAGNOSIS — H6123 Impacted cerumen, bilateral: Secondary | ICD-10-CM | POA: Diagnosis not present

## 2014-10-12 DIAGNOSIS — H26493 Other secondary cataract, bilateral: Secondary | ICD-10-CM | POA: Diagnosis not present

## 2014-10-12 DIAGNOSIS — H524 Presbyopia: Secondary | ICD-10-CM | POA: Diagnosis not present

## 2014-10-27 ENCOUNTER — Ambulatory Visit: Payer: Medicare Other | Admitting: Podiatry

## 2014-11-10 ENCOUNTER — Telehealth: Payer: Self-pay | Admitting: Endocrinology

## 2014-11-10 NOTE — Telephone Encounter (Signed)
Pt called requesting Suanne Marker call her at home regarding her medication call [phone]

## 2014-11-11 ENCOUNTER — Telehealth: Payer: Self-pay | Admitting: Endocrinology

## 2014-11-11 NOTE — Telephone Encounter (Signed)
Call pt at home . Returning your call [phone]

## 2014-11-11 NOTE — Telephone Encounter (Signed)
Left message to call back  

## 2014-11-24 ENCOUNTER — Other Ambulatory Visit: Payer: Self-pay | Admitting: *Deleted

## 2014-11-24 ENCOUNTER — Other Ambulatory Visit (INDEPENDENT_AMBULATORY_CARE_PROVIDER_SITE_OTHER): Payer: Medicare Other

## 2014-11-24 ENCOUNTER — Ambulatory Visit: Payer: Medicare Other | Admitting: Podiatry

## 2014-11-24 DIAGNOSIS — IMO0002 Reserved for concepts with insufficient information to code with codable children: Secondary | ICD-10-CM

## 2014-11-24 DIAGNOSIS — E1165 Type 2 diabetes mellitus with hyperglycemia: Secondary | ICD-10-CM | POA: Diagnosis not present

## 2014-11-24 DIAGNOSIS — E039 Hypothyroidism, unspecified: Secondary | ICD-10-CM

## 2014-11-24 LAB — LIPID PANEL
CHOL/HDL RATIO: 3
Cholesterol: 158 mg/dL (ref 0–200)
HDL: 53.8 mg/dL (ref >39.00)
LDL Cholesterol: 92 mg/dL (ref 0–99)
NonHDL: 104.2
Triglycerides: 59 mg/dL (ref 0.0–149.0)
VLDL: 11.8 mg/dL (ref 0.0–40.0)

## 2014-11-24 LAB — COMPREHENSIVE METABOLIC PANEL
ALBUMIN: 4.1 g/dL (ref 3.5–5.2)
ALT: 11 U/L (ref 0–35)
AST: 16 U/L (ref 0–37)
Alkaline Phosphatase: 44 U/L (ref 39–117)
BUN: 16 mg/dL (ref 6–23)
CALCIUM: 9.7 mg/dL (ref 8.4–10.5)
CHLORIDE: 102 meq/L (ref 96–112)
CO2: 29 mEq/L (ref 19–32)
Creatinine, Ser: 0.88 mg/dL (ref 0.40–1.20)
GFR: 79.58 mL/min (ref >60.00)
GLUCOSE: 141 mg/dL — AB (ref 70–99)
POTASSIUM: 3.6 meq/L (ref 3.5–5.1)
Sodium: 139 mEq/L (ref 135–145)
Total Bilirubin: 0.5 mg/dL (ref 0.2–1.2)
Total Protein: 7.8 g/dL (ref 6.0–8.3)

## 2014-11-24 LAB — TSH: TSH: 2.05 u[IU]/mL (ref 0.35–4.50)

## 2014-11-24 LAB — HEMOGLOBIN A1C: HEMOGLOBIN A1C: 7.4 % — AB (ref 4.6–6.5)

## 2014-11-26 ENCOUNTER — Other Ambulatory Visit: Payer: Self-pay | Admitting: Cardiovascular Disease

## 2014-11-27 ENCOUNTER — Ambulatory Visit (INDEPENDENT_AMBULATORY_CARE_PROVIDER_SITE_OTHER): Payer: Medicare Other | Admitting: Endocrinology

## 2014-11-27 ENCOUNTER — Encounter: Payer: Self-pay | Admitting: Endocrinology

## 2014-11-27 ENCOUNTER — Other Ambulatory Visit: Payer: Self-pay | Admitting: *Deleted

## 2014-11-27 VITALS — BP 112/60 | HR 72 | Temp 98.3°F | Resp 16 | Ht 63.5 in | Wt 182.2 lb

## 2014-11-27 DIAGNOSIS — I952 Hypotension due to drugs: Secondary | ICD-10-CM | POA: Diagnosis not present

## 2014-11-27 DIAGNOSIS — E039 Hypothyroidism, unspecified: Secondary | ICD-10-CM

## 2014-11-27 DIAGNOSIS — IMO0002 Reserved for concepts with insufficient information to code with codable children: Secondary | ICD-10-CM

## 2014-11-27 DIAGNOSIS — E785 Hyperlipidemia, unspecified: Secondary | ICD-10-CM | POA: Diagnosis not present

## 2014-11-27 DIAGNOSIS — E1165 Type 2 diabetes mellitus with hyperglycemia: Secondary | ICD-10-CM | POA: Diagnosis not present

## 2014-11-27 MED ORDER — CANAGLIFLOZIN-METFORMIN HCL 50-1000 MG PO TABS: 1.0000 | ORAL_TABLET | Freq: Two times a day (BID) | ORAL | Status: DC

## 2014-11-27 NOTE — Patient Instructions (Addendum)
Take Invokamet before breakfast  Check more sugars after Bfst and lunch  Always take Humalog before lunch  Felodipine 1/2 daily

## 2014-11-27 NOTE — Progress Notes (Signed)
[name] 81 y.o.            Reason for Appointment: Diabetes follow-up   History of Present Illness   Diagnosis: Type 2 DIABETES MELITUS, date of diagnosis:   2000   She has been on insulin since 2004 with difficulty controlling adequately because of compliance with diet and variability in blood sugars. Her previous records are not available at present, however she has been on basal insulin with mealtime coverage since at least 2007 She has been tried on Byetta previously but did not tolerate this well She tried Victoza but didn't not benefit from Victoza 0.6 dosage; had nausea from 1.2 mg; did not continue this because of this side effect  RECENT history:    INSULIN: Levemir 22 units daily in am, HUMALOG 6 acb, 6 at lunch 4 at supper recently   Insurance commonly change her Lantus to Levemir and her dose is unchanged Her A1c is still about the same at 7.4 Overall she has had improved control with continuing low dose Invokamet which she takes in the morning  Also on metformin ER separately which she takes in the evening.  She has the following blood sugar patterns and problems identified  She is checking blood sugars primarily in the mornings and sporadically later in the day  She tends to have higher readings in the mornings compared to her last visit except this morning it was 126  She again has sporadically high readings midday and afternoon especially in the afternoon  Despite reminders she is still not taking her insulin consistently before eating and may take it an hour after eating especially when going out for lunch she has variable readings after supper but usually not high except 2 days ago  She had an episode of significant hyperglycemia after supper on 7/16 possibly from eating less than her usual meal, it can occur over an hour to get her blood sugar back to normal  Her weight is up slightly and has not been walking as much in summer  Oral hypoglycemic  drugs: Metformin 750 mg at supper, Invokamet 50/1000 in AM     .  Proper timing of medications in relation to meals:  sometimes not at lunch with her Humalog.          Monitors blood glucose:  2-3 times daily     Glucometer: One Touch.          Blood Glucose readings   Mean values apply above for all meters except median for One Touch  PRE-MEAL Fasting Lunch Dinner  7-11 PM  Overall  Glucose range:  126-181   166-320   159-229   35-374    Mean/median: 166       Meals: 3 meals per day:  breakfast is mostly peanut butter or cheese crackers lunch 12-2 pm, dinner 5 pm Physical activity: exercise: walks upto 30 min; 1-3/7   days  Wt Readings from Last 3 Encounters:  11/27/14 182 lb 3.2 oz (82.645 kg)  09/01/14 179 lb 9.6 oz (81.466 kg)  07/16/14 184 lb (83.462 kg)    Lab Results  Component Value Date   HGBA1C 7.4* 11/24/2014   HGBA1C 7.5* 08/28/2014   HGBA1C 7.4* 05/13/2014   Lab Results  Component Value Date   MICROALBUR 15.2* 08/28/2014   LDLCALC 92 11/24/2014   CREATININE 0.88 11/24/2014    Lab on 11/24/2014  Component Date Value Ref Range Status  . Hgb A1c MFr Bld 11/24/2014 7.4* 4.6 -  6.5 % Final   Glycemic Control Guidelines for People with Diabetes:Non Diabetic:  <6%Goal of Therapy: <7%Additional Action Suggested:  >8%   . Sodium 11/24/2014 139  135 - 145 mEq/L Final  . Potassium 11/24/2014 3.6  3.5 - 5.1 mEq/L Final  . Chloride 11/24/2014 102  96 - 112 mEq/L Final  . CO2 11/24/2014 29  19 - 32 mEq/L Final  . Glucose, Bld 11/24/2014 141* 70 - 99 mg/dL Final  . BUN 11/24/2014 16  6 - 23 mg/dL Final  . Creatinine, Ser 11/24/2014 0.88  0.40 - 1.20 mg/dL Final  . Total Bilirubin 11/24/2014 0.5  0.2 - 1.2 mg/dL Final  . Alkaline Phosphatase 11/24/2014 44  39 - 117 U/L Final  . AST 11/24/2014 16  0 - 37 U/L Final  . ALT 11/24/2014 11  0 - 35 U/L Final  . Total Protein 11/24/2014 7.8  6.0 - 8.3 g/dL Final  . Albumin 11/24/2014 4.1  3.5 - 5.2 g/dL Final  . Calcium  11/24/2014 9.7  8.4 - 10.5 mg/dL Final  . GFR 11/24/2014 79.58  >60.00 mL/min Final  . TSH 11/24/2014 2.05  0.35 - 4.50 uIU/mL Final  . Cholesterol 11/24/2014 158  0 - 200 mg/dL Final   ATP III Classification       Desirable:  < 200 mg/dL               Borderline High:  200 - 239 mg/dL          High:  > = 240 mg/dL  . Triglycerides 11/24/2014 59.0  0.0 - 149.0 mg/dL Final   Normal:  <150 mg/dLBorderline High:  150 - 199 mg/dL  . HDL 11/24/2014 53.80  >39.00 mg/dL Final  . VLDL 11/24/2014 11.8  0.0 - 40.0 mg/dL Final  . LDL Cholesterol 11/24/2014 92  0 - 99 mg/dL Final  . Total CHOL/HDL Ratio 11/24/2014 3   Final                  Men          Women1/2 Average Risk     3.4          3.3Average Risk          5.0          4.42X Average Risk          9.6          7.13X Average Risk          15.0          11.0                      . NonHDL 11/24/2014 104.20   Final   NOTE:  Non-HDL goal should be 30 mg/dL higher than patient's LDL goal (i.e. LDL goal of < 70 mg/dL, would have non-HDL goal of < 100 mg/dL)       Medication List       This list is accurate as of: 11/27/14 11:59 PM.  Always use your most recent med list.               acyclovir 400 MG tablet  Commonly known as:  ZOVIRAX  Take 400 mg by mouth 2 (two) times daily.     aspirin EC 81 MG tablet  Take 1 tablet (81 mg total) by mouth daily.     benazepril 20 MG tablet  Commonly known as:  LOTENSIN  Take 1 tablet (  20 mg total) by mouth daily.     Canagliflozin-Metformin HCl 50-1000 MG Tabs  Commonly known as:  INVOKAMET  Take 1 tablet by mouth 2 (two) times daily.     carvedilol 12.5 MG tablet  Commonly known as:  COREG  TAKE 1 TABLET (12.5 MG TOTAL) BY MOUTH 2 (TWO) TIMES DAILY.     cholecalciferol 1000 UNITS tablet  Commonly known as:  VITAMIN D  Take 1,000 Units by mouth daily.     Co Q-10 100 MG Caps  Take 1 capsule by mouth 2 (two) times daily.     CRESTOR 20 MG tablet  Generic drug:  rosuvastatin  TAKE 1  TABLET (20 MG TOTAL) BY MOUTH EVERY EVENING.     felodipine 10 MG 24 hr tablet  Commonly known as:  PLENDIL  Take 1 tablet (10 mg total) by mouth daily.     Insulin Detemir 100 UNIT/ML Pen  Commonly known as:  LEVEMIR  Inject 22 Units into the skin daily before breakfast.     insulin lispro 100 UNIT/ML KiwkPen  Commonly known as:  HUMALOG  Inject 0.06-0.08 mLs (6-8 Units total) into the skin 2 (two) times daily.     Insulin Pen Needle 32G X 4 MM Misc  Commonly known as:  BD PEN NEEDLE NANO U/F  Use 3 per day     levothyroxine 75 MCG tablet  Commonly known as:  SYNTHROID, LEVOTHROID  TAKE 1 TABLET BY MOUTH EVERY DAY     metFORMIN 750 MG 24 hr tablet  Commonly known as:  GLUCOPHAGE-XR  Take 1 tablet (750 mg total) by mouth 2 (two) times daily.     multivitamins ther. w/minerals Tabs tablet  Take 1 tablet by mouth daily.     TUMS 500 MG chewable tablet  Generic drug:  calcium carbonate  Chew 1 tablet by mouth daily as needed for heartburn (indigestion).        Allergies: No Known Allergies  Past Medical History  Diagnosis Date  . Thyroid disease   . Diabetes mellitus   . Lumbago   . Aortic valve disorders     S/P AVR 06/2012  . Hyperlipidemia   . Heart murmur   . Hypertension   . Hypothyroidism   . Depression   . Shortness of breath   . Asthma     AS CHILD   . Arthritis     Past Surgical History  Procedure Laterality Date  . Tubal ligation    . Breast surgery      LT BREAST BX BENIGN  . Aortic valve replacement N/A 06/25/2012    Procedure: AORTIC VALVE REPLACEMENT (AVR);  Surgeon: Grace Isaac, MD;  Location: [place];  Service: Open Heart Surgery;  Laterality: N/A;  . Coronary artery bypass graft N/A 06/25/2012    Procedure: Coronary artery bypass graft  times two using left internal mammary artery and right leg greater saphenous vein;  Surgeon: Grace Isaac, MD;  Location: [place];  Service: Open Heart Surgery;  Laterality: N/A;  . Intraoperative  transesophageal echocardiogram N/A 06/25/2012    Procedure: INTRAOPERATIVE TRANSESOPHAGEAL ECHOCARDIOGRAM;  Surgeon: Grace Isaac, MD;  Location: [place];  Service: Open Heart Surgery;  Laterality: N/A;    Family History  Problem Relation Age of Onset  . Heart disease Father     Social History:  reports that she has never smoked. She does not have any smokeless tobacco history on file. She reports that she does not drink  alcohol or use illicit drugs.  Review of Systems:  Last Pneumovax: 06/26/05, she does not know she has had Prevnar   HYPERTENSION: taking multiple drugs and blood pressure is well controlled  HYPOTHYROIDISM: She has had long-standing hypothyroidism  Her dosage has been changed periodically. She has had consistent TSH level with reducing her dose to 75 g  Lab Results  Component Value Date   TSH 2.05 11/24/2014   TSH 2.81 03/11/2014   TSH 0.22* 01/09/2014   FREET4 1.07 03/11/2014   FREET4 1.01 09/25/2013   FREET4 1.40 07/28/2013     HYPERLIPIDEMIA: The lipid abnormality consists of elevated LDL; has been  controlled with Crestor 20 mg. Being managed by PCP.   LDL  below 100 now   Lab Results  Component Value Date   CHOL 158 11/24/2014   HDL 53.80 11/24/2014   LDLCALC 92 11/24/2014   LDLDIRECT 102.8 11/28/2013   TRIG 59.0 11/24/2014   CHOLHDL 3 11/24/2014  '       Examination:   BP 112/60 mmHg  Pulse 72  Temp(Src) 98.3 F (36.8 C)  Resp 16  Ht 5' 3.5" (1.613 m)  Wt 182 lb 3.2 oz (82.645 kg)  BMI 31.76 kg/m2  SpO2 96%  Body mass index is 31.76 kg/(m^2).   No pedal edema  ASSESSMENT/ PLAN:   Diabetes type 2, uncontrolled   See history of present illness for discussion on her recent management and problems identified Again her A1c is still over 7%  Her fasting blood sugars are higher with her insurance switching her basal insulin to Levemir from Lantus; she does not adjust and how to adjust the dose based on fasting readings Again does  not consistently take her insulin at lunchtime when eating out and blood sugars after meals are quite variable whenever she checks them. Has occasional hypoglycemia after supper probably from inadequate intake and not adjusting insulin She is again complaining about the cost of various medications, insulin being in the donut hole  She was again given the option of trying the V-go pump and she is open to this now, discussed how this will work and insulin used.  Will fill out the paperwork to get her insurance benefit assessed Also needs to be watching her diet especially when eating out  HYPERTENSION: Her blood pressure is relatively low and she also has had occasional orthostatic symptoms. She will reduce her Plendil to half tablet  HYPOTHYROID: Adequately replaced  PREVNAR: Discussed that she needs this and she will make sure she has not had this from PCP   Counseling time on subjects discussed above is over 50% of today's 25 minute visit   Taisha Pennebaker 11/28/2014, 3:09 PM   Note: This office note was prepared with Estate agent. Any transcriptional errors that result from this process are unintentional.

## 2014-12-15 ENCOUNTER — Ambulatory Visit (INDEPENDENT_AMBULATORY_CARE_PROVIDER_SITE_OTHER): Payer: Medicare Other | Admitting: Podiatry

## 2014-12-15 ENCOUNTER — Encounter: Payer: Self-pay | Admitting: Podiatry

## 2014-12-15 DIAGNOSIS — B351 Tinea unguium: Secondary | ICD-10-CM | POA: Diagnosis not present

## 2014-12-15 DIAGNOSIS — M79676 Pain in unspecified toe(s): Secondary | ICD-10-CM | POA: Diagnosis not present

## 2014-12-15 NOTE — Progress Notes (Signed)
Patient ID: [name], female   DOB: [birth year], 81 y.o.   MRN: [id number]  Subjective: This patient presents today complaining of uncomfortable toenails and requests toenail debridement. She denies any history of foot ulceration or claudication. The last visit for similar service was on 06/04/2014  Objective: Orientated 3 DP and PT pulses 2/4 bilaterally The toenails are elongated, incurvated, discolored and tender to direct palpation 6-10 Sensation to 10 g monofilament wire intact 5/5 bilaterally Plantar callus subsecond MPJ right HAV bilaterally Hammertoe second bilaterally  Assessment: Satisfactory neurovascular status Symptomatic onychomycoses 6-10 Plantar keratoses 1  Plan: Debridement of toenails 10 mechanically andelectrically without any bleeding Right plantar callus 1 without a bleeding  Reappoint 3 months

## 2014-12-15 NOTE — Patient Instructions (Signed)
Diabetes and Foot Care Diabetes may cause you to have problems because of poor blood supply (circulation) to your feet and legs. This may cause the skin on your feet to become thinner, break easier, and heal more slowly. Your skin may become dry, and the skin may peel and crack. You may also have nerve damage in your legs and feet causing decreased feeling in them. You may not notice minor injuries to your feet that could lead to infections or more serious problems. Taking care of your feet is one of the most important things you can do for yourself.  HOME CARE INSTRUCTIONS  Wear shoes at all times, even in the house. Do not go barefoot. Bare feet are easily injured.  Check your feet daily for blisters, cuts, and redness. If you cannot see the bottom of your feet, use a mirror or ask someone for help.  Wash your feet with warm water (do not use hot water) and mild soap. Then pat your feet and the areas between your toes until they are completely dry. Do not soak your feet as this can dry your skin.  Apply a moisturizing lotion or petroleum jelly (that does not contain alcohol and is unscented) to the skin on your feet and to dry, brittle toenails. Do not apply lotion between your toes.  Trim your toenails straight across. Do not dig under them or around the cuticle. File the edges of your nails with an emery board or nail file.  Do not cut corns or calluses or try to remove them with medicine.  Wear clean socks or stockings every day. Make sure they are not too tight. Do not wear knee-high stockings since they may decrease blood flow to your legs.  Wear shoes that fit properly and have enough cushioning. To break in new shoes, wear them for just a few hours a day. This prevents you from injuring your feet. Always look in your shoes before you put them on to be sure there are no objects inside.  Do not cross your legs. This may decrease the blood flow to your feet.  If you find a minor scrape,  cut, or break in the skin on your feet, keep it and the skin around it clean and dry. These areas may be cleansed with mild soap and water. Do not cleanse the area with peroxide, alcohol, or iodine.  When you remove an adhesive bandage, be sure not to damage the skin around it.  If you have a wound, look at it several times a day to make sure it is healing.  Do not use heating pads or hot water bottles. They may burn your skin. If you have lost feeling in your feet or legs, you may not know it is happening until it is too late.  Make sure your health care provider performs a complete foot exam at least annually or more often if you have foot problems. Report any cuts, sores, or bruises to your health care provider immediately. SEEK MEDICAL CARE IF:   You have an injury that is not healing.  You have cuts or breaks in the skin.  You have an ingrown nail.  You notice redness on your legs or feet.  You feel burning or tingling in your legs or feet.  You have pain or cramps in your legs and feet.  Your legs or feet are numb.  Your feet always feel cold. SEEK IMMEDIATE MEDICAL CARE IF:   There is increasing redness,   swelling, or pain in or around a wound.  There is a red line that goes up your leg.  Pus is coming from a wound.  You develop a fever or as directed by your health care provider.  You notice a bad smell coming from an ulcer or wound. Document Released: 04/14/2000 Document Revised: 12/18/2012 Document Reviewed: 09/24/2012 Exit[employer] Patient Information 2015 [employer]. This information is not intended to replace advice given to you by your health care provider. Make sure you discuss any questions you have with your health care provider.  

## 2014-12-24 DIAGNOSIS — H20042 Secondary noninfectious iridocyclitis, left eye: Secondary | ICD-10-CM | POA: Diagnosis not present

## 2014-12-24 DIAGNOSIS — Z961 Presence of intraocular lens: Secondary | ICD-10-CM | POA: Diagnosis not present

## 2015-01-19 ENCOUNTER — Ambulatory Visit (INDEPENDENT_AMBULATORY_CARE_PROVIDER_SITE_OTHER): Payer: Medicare Other | Admitting: Nurse Practitioner

## 2015-01-19 ENCOUNTER — Encounter: Payer: Self-pay | Admitting: Nurse Practitioner

## 2015-01-19 ENCOUNTER — Ambulatory Visit
Admission: RE | Admit: 2015-01-19 | Discharge: 2015-01-19 | Disposition: A | Discharge status: Home / Self Care | Payer: Medicare Other | Source: Ambulatory Visit | Attending: Nurse Practitioner | Admitting: Nurse Practitioner

## 2015-01-19 VITALS — BP 120/70 | HR 99 | Ht 63.0 in | Wt 180.1 lb

## 2015-01-19 DIAGNOSIS — Z952 Presence of prosthetic heart valve: Secondary | ICD-10-CM

## 2015-01-19 DIAGNOSIS — E785 Hyperlipidemia, unspecified: Secondary | ICD-10-CM | POA: Diagnosis not present

## 2015-01-19 DIAGNOSIS — R0600 Dyspnea, unspecified: Secondary | ICD-10-CM

## 2015-01-19 DIAGNOSIS — Z951 Presence of aortocoronary bypass graft: Secondary | ICD-10-CM

## 2015-01-19 DIAGNOSIS — I10 Essential (primary) hypertension: Secondary | ICD-10-CM

## 2015-01-19 DIAGNOSIS — Z954 Presence of other heart-valve replacement: Secondary | ICD-10-CM | POA: Diagnosis not present

## 2015-01-19 LAB — BASIC METABOLIC PANEL
BUN: 16 mg/dL (ref 6–23)
CO2: 30 mEq/L (ref 19–32)
Calcium: 10.2 mg/dL (ref 8.4–10.5)
Chloride: 101 mEq/L (ref 96–112)
Creatinine, Ser: 0.87 mg/dL (ref 0.40–1.20)
GFR: 80.61 mL/min (ref >60.00)
Glucose, Bld: 67 mg/dL — ABNORMAL LOW (ref 70–99)
Potassium: 3.7 mEq/L (ref 3.5–5.1)
Sodium: 139 mEq/L (ref 135–145)

## 2015-01-19 LAB — CBC
HCT: 34.7 % — ABNORMAL LOW (ref 36.0–46.0)
Hemoglobin: 11.2 g/dL — ABNORMAL LOW (ref 12.0–15.0)
MCHC: 32.2 g/dL (ref 30.0–36.0)
MCV: 93.9 fl (ref 78.0–100.0)
Platelets: 222 10*3/uL (ref 150.0–400.0)
RBC: 3.7 Mil/uL — ABNORMAL LOW (ref 3.87–5.11)
RDW: 14.5 % (ref 11.5–15.5)
WBC: 5.8 10*3/uL (ref 4.0–10.5)

## 2015-01-19 LAB — HEPATIC FUNCTION PANEL
ALT: 10 U/L (ref 0–35)
AST: 18 U/L (ref 0–37)
Albumin: 4.5 g/dL (ref 3.5–5.2)
Alkaline Phosphatase: 45 U/L (ref 39–117)
Bilirubin, Direct: 0.1 mg/dL (ref 0.0–0.3)
Total Bilirubin: 0.5 mg/dL (ref 0.2–1.2)
Total Protein: 8.5 g/dL — ABNORMAL HIGH (ref 6.0–8.3)

## 2015-01-19 LAB — BRAIN NATRIURETIC PEPTIDE: Pro B Natriuretic peptide (BNP): 121 pg/mL — ABNORMAL HIGH (ref 0.0–100.0)

## 2015-01-19 NOTE — Progress Notes (Signed)
CARDIOLOGY OFFICE NOTE  Date:  01/19/2015    [name] Date of Birth: Nov 26, 1932 Medical Record #[id number]  PCP:  Kevan Ny, MD  Cardiologist:  Nahser    Chief Complaint  Patient presents with  . Coronary Artery Disease    6 month check - seen for Dr. Acie Fredrickson  . Cardiac Valve Problem  . Shortness of Breath    History of Present Illness: [name] is a 81 y.o. female who presents today for a work in visit. Seen for Dr. Acie Fredrickson. She has a history of DM, prior AVR and CABG x 2 per EBG back in 2014 - this was with a LIMA to the LAD and SVG to the LCX, with a pericardial Edwards tissue valve #21 mm. Did have post op atrial fib - was on amiodarone. Other issues include HLD, HTN, DM, depression and hypothyroidism.    Last seen back in March of 2016. Seemed to be doing ok.  Back here today. Here with her sister today. She says she is short of breath. This started just in the last 2 to 3 weeks - always with exertion - even minimal activities such as cooking and making the bed. Resolved with rest. No cough. No PND/orthopnea. No swelling/abdominal bloating.  Not dizzy or lightheaded. No real chest pain. Struggles to lose weight.   Past Medical History  Diagnosis Date  . Thyroid disease   . Diabetes mellitus   . Lumbago   . Aortic valve disorders     S/P AVR 06/2012  . Hyperlipidemia   . Heart murmur   . Hypertension   . Hypothyroidism   . Depression   . Shortness of breath   . Asthma     AS CHILD   . Arthritis     Past Surgical History  Procedure Laterality Date  . Tubal ligation    . Breast surgery      LT BREAST BX BENIGN  . Aortic valve replacement N/A 06/25/2012    Procedure: AORTIC VALVE REPLACEMENT (AVR);  Surgeon: Grace Isaac, MD;  Location: [facility];  Service: Open Heart Surgery;  Laterality: N/A;  . Coronary artery bypass graft N/A 06/25/2012    Procedure: Coronary artery bypass graft  times two using left internal mammary artery and right leg  greater saphenous vein;  Surgeon: Grace Isaac, MD;  Location: [facility];  Service: Open Heart Surgery;  Laterality: N/A;  . Intraoperative transesophageal echocardiogram N/A 06/25/2012    Procedure: INTRAOPERATIVE TRANSESOPHAGEAL ECHOCARDIOGRAM;  Surgeon: Grace Isaac, MD;  Location: [facility];  Service: Open Heart Surgery;  Laterality: N/A;     Medications: Current Outpatient Prescriptions  Medication Sig Dispense Refill  . aspirin EC 81 MG tablet Take 1 tablet (81 mg total) by mouth daily.    . benazepril (LOTENSIN) 20 MG tablet Take 1 tablet (20 mg total) by mouth daily. (Patient taking differently: Take 10 mg by mouth daily. ) 30 tablet   . calcium carbonate (TUMS) 500 MG chewable tablet Chew 1 tablet by mouth daily as needed for heartburn (indigestion).    . Canagliflozin-Metformin HCl (INVOKAMET) 50-1000 MG TABS Take 1 tablet by mouth 2 (two) times daily. 60 tablet 3  . carvedilol (COREG) 12.5 MG tablet TAKE 1 TABLET (12.5 MG TOTAL) BY MOUTH 2 (TWO) TIMES DAILY. 60 tablet 6  . cholecalciferol (VITAMIN D) 1000 UNITS tablet Take 1,000 Units by mouth daily.    . Coenzyme Q10 (CO Q-10) 100 MG CAPS Take  1 capsule by mouth 2 (two) times daily.    . CRESTOR 20 MG tablet TAKE 1 TABLET (20 MG TOTAL) BY MOUTH EVERY EVENING. 90 tablet 3  . felodipine (PLENDIL) 10 MG 24 hr tablet Take 1 tablet (10 mg total) by mouth daily. 90 tablet 3  . Insulin Detemir (LEVEMIR) 100 UNIT/ML Pen Inject 22 Units into the skin daily before breakfast. 15 mL 11  . insulin lispro (HUMALOG) 100 UNIT/ML KiwkPen Inject 0.06-0.08 mLs (6-8 Units total) into the skin 2 (two) times daily. (Patient taking differently: Inject 6-8 Units into the skin 3 (three) times daily. ) 15 mL 3  . Insulin Pen Needle (BD PEN NEEDLE NANO U/F) 32G X 4 MM MISC Use 3 per day 100 each 3  . levothyroxine (SYNTHROID, LEVOTHROID) 75 MCG tablet TAKE 1 TABLET BY MOUTH EVERY DAY 30 tablet 5  . metFORMIN (GLUCOPHAGE-XR) 750 MG 24 hr tablet Take 1 tablet  (750 mg total) by mouth 2 (two) times daily. (Patient taking differently: Take 750 mg by mouth daily. ) 120 tablet 5  . Multiple Vitamins-Minerals (MULTIVITAMINS THER. W/MINERALS) TABS Take 1 tablet by mouth daily.     No current facility-administered medications for this visit.    Allergies: No Known Allergies  Social History: The patient  reports that she has never smoked. She does not have any smokeless tobacco history on file. She reports that she does not drink alcohol or use illicit drugs.   Family History: The patient's family history includes Heart disease in her father.   Review of Systems: Please see the history of present illness.   Otherwise, the review of systems is positive for none.   All other systems are reviewed and negative.   Physical Exam: VS:  BP 120/70 mmHg  Pulse 99  Ht 5\' 3"  (1.6 m)  Wt 180 lb 1.9 oz (81.702 kg)  BMI 31.91 kg/m2  SpO2 98% .  BMI Body mass index is 31.91 kg/(m^2).  Wt Readings from Last 3 Encounters:  01/19/15 180 lb 1.9 oz (81.702 kg)  11/27/14 182 lb 3.2 oz (82.645 kg)  09/01/14 179 lb 9.6 oz (81.466 kg)    General: Pleasant. Well developed, well nourished and in no acute distress.  HEENT: Normal. Neck: Supple, no JVD, carotid bruits, or masses noted.  Cardiac: Regular rate and rhythm. No murmurs, rubs, or gallops. No edema.  Respiratory:  Lungs are clear to auscultation bilaterally with normal work of breathing.  GI: Soft and nontender.  MS: No deformity or atrophy. Gait and ROM intact. Skin: Warm and dry. Color is normal.  Neuro:  Strength and sensation are intact and no gross focal deficits noted.  Psych: Alert, appropriate and with normal affect.   LABORATORY DATA:  EKG:  EKG is ordered today. This demonstrates NSR with poor R wave progression.  Lab Results  Component Value Date   WBC 6.2 06/05/2013   HGB 10.4* 06/05/2013   HCT 31.7* 06/05/2013   PLT 237.0 06/05/2013   GLUCOSE 141* 11/24/2014   CHOL 158 11/24/2014     TRIG 59.0 11/24/2014   HDL 53.80 11/24/2014   LDLDIRECT 102.8 11/28/2013   LDLCALC 92 11/24/2014   ALT 11 11/24/2014   AST 16 11/24/2014   NA 139 11/24/2014   K 3.6 11/24/2014   CL 102 11/24/2014   CREATININE 0.88 11/24/2014   BUN 16 11/24/2014   CO2 29 11/24/2014   TSH 2.05 11/24/2014   INR 1.57* 06/25/2012   HGBA1C 7.4* 11/24/2014  MICROALBUR 15.2* 08/28/2014    BNP (last 3 results) No results for input(s): BNP in the last 8760 hours.  ProBNP (last 3 results) No results for input(s): PROBNP in the last 8760 hours.   Other Studies Reviewed Today:  Echo Study Conclusions from 06/2013  - Left ventricle: The cavity size was normal. There was moderate concentric hypertrophy. Systolic function was normal. The estimated ejection fraction was in the range of 60% to 65%. Wall motion was normal; there were no regional wall motion abnormalities. Doppler parameters are consistent with abnormal left ventricular relaxation (grade 1 diastolic dysfunction). Doppler parameters are consistent with elevated ventricular end-diastolic filling pressure. - Aortic valve: A mean transaortic gradient was 13 mmHg, peak 24 mmHg. Theses gradients are normal for this type of prosthetic valve and unchanged from the prior study. A bioprosthetic valve sits well in the aortic postion. There is no abnormal rocking motion, no central or paravalvulat leak. Trileaflet; normal thickness leaflets. Mobility was not restricted. No regurgitation. - Aortic root: The aortic root was normal in size. - Mitral valve: Mildly thickened leaflets . Mild regurgitation. - Left atrium: The atrium was moderately dilated. - Right ventricle: The cavity size was mildly dilated. Wall thickness was normal. Systolic function was moderately reduced. - Right atrium: The atrium was mildly dilated. - Tricuspid valve: Mild-moderate regurgitation. - Pulmonic valve: Mild regurgitation. -  Pulmonary arteries: Systolic pressure was mildly increased. PA peak pressure: 56mm Hg (S). - Inferior vena cava: The vessel was normal in size.  Myoview Impression from 06/2013 Exercise Capacity: Lexiscan with no exercise. BP Response: Normal blood pressure response. Clinical Symptoms: No significant symptoms noted. ECG Impression: There are scattered PVCs. Comparison with Prior Nuclear Study: No previous nuclear study performed  Overall Impression: Low risk stress nuclear study small fixed distal anterior artifact which could be breast attenuation or other artifact.  LV Ejection Fraction: 66%. LV Wall Motion: Normal Wall Motion  [name], MD, [facility]    Assessment/Plan: 1. CAD - s/p CABG 06-27-12 -last Myoview stable.   2. Aortic stenosis with prior AVR - last echo ok - now with dyspnea with minimal activities - will get her echo updated. Check labs today. Send for CXR today. I will see her back for discussion.  I suspect some of this is due to her weight and deconditioning. May need to consider pulmonary referral.   3. Post op atrial fibrillation - remains in NSR by EKG today.  4. HTN - her blood pressures well-controlled. I have left her on her current regimen for now.   Current medicines are reviewed with the patient today.  The patient does not have concerns regarding medicines other than what has been noted above.  The following changes have been made:  See above.  Labs/ tests ordered today include:    Orders Placed This Encounter  Procedures  . DG Chest 2 View  . Brain natriuretic peptide  . Basic metabolic panel  . CBC  . Hepatic function panel  . EKG 12-Lead  . ECHOCARDIOGRAM COMPLETE     Disposition:   FU with me once her studies are complete. May need to send to pulmonary. Consider PFTs.    Patient is agreeable to this plan and will call if any problems develop in the interim.   Signed: Burtis Junes, RN, ANP-C 01/19/2015 12:29  PM  [facility] [address] [place], [place]  [address] Phone: [phone] Fax: [phone]

## 2015-01-19 NOTE — Patient Instructions (Addendum)
We will be checking the following labs today - BMET, BNP, CBC and HPF  Please go to [facility] to [place] on the first floor for a chest Xray - you may walk in.    Medication Instructions:    Continue with your current medicines.     Testing/Procedures To Be Arranged:  Echocardiogram  Follow-Up:   See me in 2 weeks for discussion    Other Special Instructions:   N/A  Call the [place] office at [phone] if you have any questions, problems or concerns.

## 2015-01-20 ENCOUNTER — Telehealth: Payer: Self-pay | Admitting: Nurse Practitioner

## 2015-01-20 NOTE — Telephone Encounter (Addendum)
New message      Calling to get lab and xray results.

## 2015-01-21 DIAGNOSIS — Z961 Presence of intraocular lens: Secondary | ICD-10-CM | POA: Diagnosis not present

## 2015-01-21 DIAGNOSIS — H20042 Secondary noninfectious iridocyclitis, left eye: Secondary | ICD-10-CM | POA: Diagnosis not present

## 2015-01-25 ENCOUNTER — Other Ambulatory Visit (INDEPENDENT_AMBULATORY_CARE_PROVIDER_SITE_OTHER): Payer: Medicare Other

## 2015-01-25 DIAGNOSIS — IMO0002 Reserved for concepts with insufficient information to code with codable children: Secondary | ICD-10-CM

## 2015-01-25 DIAGNOSIS — E1165 Type 2 diabetes mellitus with hyperglycemia: Secondary | ICD-10-CM | POA: Diagnosis not present

## 2015-01-25 LAB — BASIC METABOLIC PANEL
BUN: 20 mg/dL (ref 6–23)
CALCIUM: 9.6 mg/dL (ref 8.4–10.5)
CO2: 30 meq/L (ref 19–32)
CREATININE: 0.83 mg/dL (ref 0.40–1.20)
Chloride: 101 mEq/L (ref 96–112)
GFR: 85.1 mL/min (ref >60.00)
Glucose, Bld: 92 mg/dL (ref 70–99)
Potassium: 4 mEq/L (ref 3.5–5.1)
Sodium: 138 mEq/L (ref 135–145)

## 2015-01-26 ENCOUNTER — Other Ambulatory Visit: Payer: Self-pay

## 2015-01-26 ENCOUNTER — Ambulatory Visit (HOSPITAL_COMMUNITY): Payer: Medicare Other | Attending: Cardiology

## 2015-01-26 DIAGNOSIS — I10 Essential (primary) hypertension: Secondary | ICD-10-CM

## 2015-01-26 DIAGNOSIS — R0600 Dyspnea, unspecified: Secondary | ICD-10-CM

## 2015-01-26 DIAGNOSIS — I371 Nonrheumatic pulmonary valve insufficiency: Secondary | ICD-10-CM | POA: Diagnosis not present

## 2015-01-26 DIAGNOSIS — I340 Nonrheumatic mitral (valve) insufficiency: Secondary | ICD-10-CM | POA: Diagnosis not present

## 2015-01-26 DIAGNOSIS — Z951 Presence of aortocoronary bypass graft: Secondary | ICD-10-CM | POA: Diagnosis not present

## 2015-01-26 DIAGNOSIS — I517 Cardiomegaly: Secondary | ICD-10-CM | POA: Insufficient documentation

## 2015-01-26 DIAGNOSIS — Z954 Presence of other heart-valve replacement: Secondary | ICD-10-CM

## 2015-01-26 DIAGNOSIS — Z952 Presence of prosthetic heart valve: Secondary | ICD-10-CM | POA: Insufficient documentation

## 2015-01-26 DIAGNOSIS — Z8249 Family history of ischemic heart disease and other diseases of the circulatory system: Secondary | ICD-10-CM | POA: Insufficient documentation

## 2015-01-26 DIAGNOSIS — E119 Type 2 diabetes mellitus without complications: Secondary | ICD-10-CM | POA: Diagnosis not present

## 2015-01-26 DIAGNOSIS — E785 Hyperlipidemia, unspecified: Secondary | ICD-10-CM

## 2015-01-26 LAB — FRUCTOSAMINE: FRUCTOSAMINE: 307 umol/L — AB (ref 0–285)

## 2015-01-28 ENCOUNTER — Other Ambulatory Visit: Payer: Self-pay | Admitting: Endocrinology

## 2015-01-28 ENCOUNTER — Ambulatory Visit (INDEPENDENT_AMBULATORY_CARE_PROVIDER_SITE_OTHER): Payer: Medicare Other | Admitting: Endocrinology

## 2015-01-28 VITALS — BP 138/80 | HR 73 | Temp 98.3°F | Resp 16 | Ht 63.0 in | Wt 182.0 lb

## 2015-01-28 DIAGNOSIS — E039 Hypothyroidism, unspecified: Secondary | ICD-10-CM | POA: Diagnosis not present

## 2015-01-28 DIAGNOSIS — I10 Essential (primary) hypertension: Secondary | ICD-10-CM | POA: Diagnosis not present

## 2015-01-28 DIAGNOSIS — E1165 Type 2 diabetes mellitus with hyperglycemia: Secondary | ICD-10-CM | POA: Diagnosis not present

## 2015-01-28 DIAGNOSIS — Z23 Encounter for immunization: Secondary | ICD-10-CM

## 2015-01-28 DIAGNOSIS — IMO0002 Reserved for concepts with insufficient information to code with codable children: Secondary | ICD-10-CM

## 2015-01-28 MED ORDER — ROSUVASTATIN CALCIUM 20 MG PO TABS: 20.0000 mg | ORAL_TABLET | Freq: Every day | ORAL | Status: DC

## 2015-01-28 NOTE — Progress Notes (Signed)
[name] 81 y.o.            Reason for Appointment: Diabetes follow-up   History of Present Illness   Diagnosis: Type 2 DIABETES MELITUS, date of diagnosis:   2000   She has been on insulin since 2004 with difficulty controlling adequately because of compliance with diet and variability in blood sugars. Her previous records are not available at present, however she has been on basal insulin with mealtime coverage since at least 2007 She has been tried on Byetta previously but did not tolerate this well She tried Victoza but didn't not benefit from Victoza 0.6 dosage; had nausea from 1.2 mg; did not continue this because of this side effect  RECENT history:    INSULIN: Levemir 22 units daily in am, HUMALOG 6-8 acb, 6-8 pc lunch and 4 at supper    Overall she has had improved control with continuing low dose Invokamet which she takes in the morning  Also on metformin ER separately which she takes in the evening. Her last A1c is still about the same at 7.4 On her last visit she was advised to increase her dosage of Humalog at breakfast and lunch She will also recommended the V-go pump for better control and compliance but she does not want to do this  She has the following blood sugar patterns and problems identified  She is now taking her Humalog in the afternoon about 2 hours after eating and not clear why she is doing this.  She has been instructed on taking her Humalog before eating consistently including when she is eating out  Her fasting readings are relatively better than the last time even without any change in her insulin  She thinks her blood sugars are overall improving because she is trying to watch her portions better and is trying to walk more regularly also  Despite her not covering her lunch with insulin lower sugars in the afternoon are not as high as before  Blood sugars are generally fairly good in the evening  No recent hypoglycemia  Oral hypoglycemic  drugs: Metformin 750 mg at supper, Invokamet 50/1000 in AM     .         Monitors blood glucose:  about 3 times daily     Glucometer: One Touch.          Blood Glucose readings   Mean values apply above for all meters except median for One Touch  PRE-MEAL Fasting Lunch Dinner Bedtime Overall  Glucose range:  06-179   63-175      Mean/median: 137     143+/-44    POST-MEAL PC Breakfast PC Lunch PC Dinner  Glucose range:   87-304   101-233   Mean/median:  184  143    Meals: 3 meals per day:  breakfast is mostly peanut butter or cheese crackers lunch 12-2 pm, dinner 5 pm Physical activity: exercise: walks upto 30 min; 1-3/7   days  Wt Readings from Last 3 Encounters:  01/28/15 182 lb (82.555 kg)  01/19/15 180 lb 1.9 oz (81.702 kg)  11/27/14 182 lb 3.2 oz (82.645 kg)    Lab Results  Component Value Date   HGBA1C 7.4* 11/24/2014   HGBA1C 7.5* 08/28/2014   HGBA1C 7.4* 05/13/2014   Lab Results  Component Value Date   MICROALBUR 15.2* 08/28/2014   LDLCALC 92 11/24/2014   CREATININE 0.83 01/25/2015    Appointment on 01/25/2015  Component Date Value Ref Range Status  .  Sodium 01/25/2015 138  135 - 145 mEq/L Final  . Potassium 01/25/2015 4.0  3.5 - 5.1 mEq/L Final  . Chloride 01/25/2015 101  96 - 112 mEq/L Final  . CO2 01/25/2015 30  19 - 32 mEq/L Final  . Glucose, Bld 01/25/2015 92  70 - 99 mg/dL Final  . BUN 01/25/2015 20  6 - 23 mg/dL Final  . Creatinine, Ser 01/25/2015 0.83  0.40 - 1.20 mg/dL Final  . Calcium 01/25/2015 9.6  8.4 - 10.5 mg/dL Final  . GFR 01/25/2015 85.10  >60.00 mL/min Final  . Fructosamine 01/25/2015 307* 0 - 285 umol/L Final   Comment: Published reference interval for apparently healthy subjects between age 67 and 74 is 19 - 285 umol/L and in a poorly controlled diabetic population is 228 - 563 umol/L with a mean of 396 umol/L.        Medication List       This list is accurate as of: 01/28/15 10:55 AM.  Always use your most recent med list.                 aspirin EC 81 MG tablet  Take 1 tablet (81 mg total) by mouth daily.     benazepril 20 MG tablet  Commonly known as:  LOTENSIN  Take 1 tablet (20 mg total) by mouth daily.     Canagliflozin-Metformin HCl 50-1000 MG Tabs  Commonly known as:  INVOKAMET  Take 1 tablet by mouth 2 (two) times daily.     carvedilol 12.5 MG tablet  Commonly known as:  COREG  TAKE 1 TABLET (12.5 MG TOTAL) BY MOUTH 2 (TWO) TIMES DAILY.     cholecalciferol 1000 UNITS tablet  Commonly known as:  VITAMIN D  Take 1,000 Units by mouth daily.     Co Q-10 100 MG Caps  Take 1 capsule by mouth 2 (two) times daily.     CRESTOR 20 MG tablet  Generic drug:  rosuvastatin  TAKE 1 TABLET (20 MG TOTAL) BY MOUTH EVERY EVENING.     felodipine 10 MG 24 hr tablet  Commonly known as:  PLENDIL  Take 1 tablet (10 mg total) by mouth daily.     Insulin Detemir 100 UNIT/ML Pen  Commonly known as:  LEVEMIR  Inject 22 Units into the skin daily before breakfast.     insulin lispro 100 UNIT/ML KiwkPen  Commonly known as:  HUMALOG  Inject 0.06-0.08 mLs (6-8 Units total) into the skin 2 (two) times daily.     Insulin Pen Needle 32G X 4 MM Misc  Commonly known as:  BD PEN NEEDLE NANO U/F  Use 3 per day     levothyroxine 75 MCG tablet  Commonly known as:  SYNTHROID, LEVOTHROID  TAKE 1 TABLET BY MOUTH EVERY DAY     metFORMIN 750 MG 24 hr tablet  Commonly known as:  GLUCOPHAGE-XR  Take 1 tablet (750 mg total) by mouth 2 (two) times daily.     multivitamins ther. w/minerals Tabs tablet  Take 1 tablet by mouth daily.     TUMS 500 MG chewable tablet  Generic drug:  calcium carbonate  Chew 1 tablet by mouth daily as needed for heartburn (indigestion).        Allergies: No Known Allergies  Past Medical History  Diagnosis Date  . Thyroid disease   . Diabetes mellitus   . Lumbago   . Aortic valve disorders     S/P AVR 06/2012  . Hyperlipidemia   .  Heart murmur   . Hypertension   . Hypothyroidism    . Depression   . Shortness of breath   . Asthma     AS CHILD   . Arthritis     Past Surgical History  Procedure Laterality Date  . Tubal ligation    . Breast surgery      LT BREAST BX BENIGN  . Aortic valve replacement N/A 06/25/2012    Procedure: AORTIC VALVE REPLACEMENT (AVR);  Surgeon: Grace Isaac, MD;  Location: [place];  Service: Open Heart Surgery;  Laterality: N/A;  . Coronary artery bypass graft N/A 06/25/2012    Procedure: Coronary artery bypass graft  times two using left internal mammary artery and right leg greater saphenous vein;  Surgeon: Grace Isaac, MD;  Location: [place];  Service: Open Heart Surgery;  Laterality: N/A;  . Intraoperative transesophageal echocardiogram N/A 06/25/2012    Procedure: INTRAOPERATIVE TRANSESOPHAGEAL ECHOCARDIOGRAM;  Surgeon: Grace Isaac, MD;  Location: [facility];  Service: Open Heart Surgery;  Laterality: N/A;    Family History  Problem Relation Age of Onset  . Heart disease Father     Social History:  reports that she has never smoked. She does not have any smokeless tobacco history on file. She reports that she does not drink alcohol or use illicit drugs.  Review of Systems:  Last Pneumovax: 06/26/05, she does not know she has had Prevnar   HYPERTENSION: taking multiple drugs and blood pressure is well controlled, her Plendil was reduced to half a tablet on previous visit Blood pressure was slightly higher on first measurement today  She is asking about taking Lasix that was recommended by the cardiologist.  She does not have any recent swelling of her ankles.  She was being evaluated because of a feeling of tightness in her chest but does not have any evidence of LV dysfunction or CHF  HYPOTHYROIDISM: She has had long-standing hypothyroidism  Her dosage has been changed in 9/15. She has had consistent TSH level with reducing her dose to 75 g  Lab Results  Component Value Date   TSH 2.05 11/24/2014   TSH 2.81 03/11/2014    TSH 0.22* 01/09/2014   FREET4 1.07 03/11/2014   FREET4 1.01 09/25/2013   FREET4 1.40 07/28/2013    HYPERLIPIDEMIA: The lipid abnormality consists of elevated LDL; has been  controlled with Crestor 20 mg., still taking brand name Being managed by PCP.   LDL  below 100    Lab Results  Component Value Date   CHOL 158 11/24/2014   HDL 53.80 11/24/2014   LDLCALC 92 11/24/2014   LDLDIRECT 102.8 11/28/2013   TRIG 59.0 11/24/2014   CHOLHDL 3 11/24/2014  '       Examination:   BP 124/84 mmHg  Pulse 73  Temp(Src) 98.3 F (36.8 C)  Resp 16  Ht 5\' 3"  (1.6 m)  Wt 182 lb (82.555 kg)  BMI 32.25 kg/m2  SpO2 97%  Body mass index is 32.25 kg/(m^2).   No pedal edema  ASSESSMENT/ PLAN:   Diabetes type 2, uncontrolled   See history of present illness for discussion on her recent management and problems identified Again her blood sugars are not well-controlled Although she is improving her compliance with diet and is trying to walk she is still having periodic high readings at all times Fructosamine is still high although relatively better than before Her highest blood sugars are after lunch because she is not understanding the need to  take Humalog before eating despite reminders Have discussed with the patient her blood sugar patterns Also discussed the time of onset of action and duration of action of Humalog and need to combine this with a meal  Again discussed the V-go pump but she is not wanting to do this Have recommended that she change her Humalog in the afternoon to before lunch She may need a little less insulin at breakfast if she starts getting low readings at lunch Since fasting readings are improving will not increase her Levemir To have A1c checked on the next visit  HYPERTENSION: Her blood pressure is better now and she will continue the same dose  Chest tightness: She has been evaluated by cardiologist.  Discussed that if she is having shortness of breath she may  take an occasional Lasix as needed given with she is taking Invokana as her renal function is quite normal  Influenza vaccine given  Counseling time on subjects discussed above is over 50% of today's 25 minute visit   [name] 01/28/2015, 10:55 AM   Note: This office note was prepared with Estate agent. Any transcriptional errors that result from this process are unintentional.

## 2015-01-28 NOTE — Patient Instructions (Signed)
Must take Humalog before lunch ALWAYS  Check blood sugars on waking up .. 3 .. times a week Also check blood sugars about 2 hours after a meal and do this after different meals by rotation Recommended blood sugar levels on waking up is 90-130 and about 2 hours after meal is 140-180 Please bring blood sugar monitor to each visit.

## 2015-02-02 ENCOUNTER — Encounter: Payer: Self-pay | Admitting: Nurse Practitioner

## 2015-02-02 ENCOUNTER — Ambulatory Visit: Payer: Medicare Other | Admitting: Cardiovascular Disease

## 2015-02-02 ENCOUNTER — Ambulatory Visit (INDEPENDENT_AMBULATORY_CARE_PROVIDER_SITE_OTHER): Payer: Medicare Other | Admitting: Nurse Practitioner

## 2015-02-02 VITALS — BP 122/70 | HR 72 | Ht 63.0 in | Wt 179.8 lb

## 2015-02-02 DIAGNOSIS — I10 Essential (primary) hypertension: Secondary | ICD-10-CM

## 2015-02-02 DIAGNOSIS — Z954 Presence of other heart-valve replacement: Secondary | ICD-10-CM

## 2015-02-02 DIAGNOSIS — Z951 Presence of aortocoronary bypass graft: Secondary | ICD-10-CM | POA: Diagnosis not present

## 2015-02-02 DIAGNOSIS — R0602 Shortness of breath: Secondary | ICD-10-CM

## 2015-02-02 DIAGNOSIS — Z952 Presence of prosthetic heart valve: Secondary | ICD-10-CM

## 2015-02-02 MED ORDER — FUROSEMIDE 20 MG PO TABS: 20.0000 mg | ORAL_TABLET | Freq: Every day | ORAL | Status: DC | PRN

## 2015-02-02 NOTE — Patient Instructions (Addendum)
We will be checking the following labs today - NONE   Medication Instructions:    Continue with your current medicines.   I have sent in Lasix 20 mg to take just as needed - this will be for weight gain, swelling or more shortness of breath- this has been sent to the drug store.     Testing/Procedures To Be Arranged:  N/A  Follow-Up:   See me in 2 months    Other Special Instructions:   N/A  Call the [place] office at [phone] if you have any questions, problems or concerns.

## 2015-02-02 NOTE — Progress Notes (Signed)
CARDIOLOGY OFFICE NOTE  Date:  02/02/2015    [name] Date of Birth: 04-24-1933 Medical Record #[id number]  PCP:  Kevan Ny, MD  Cardiologist:  Nahser    Chief Complaint  Patient presents with  . Follow up after echo/labs for dyspnea    Seen for Dr. Acie Fredrickson    History of Present Illness: [name] is a 81 y.o. female who presents today for a follow up visit. Seen for Dr. Acie Fredrickson. She has a history of DM, prior AVR and CABG x 2 per EBG back in 2014 - this was with a LIMA to the LAD and SVG to the LCX, with a pericardial Edwards tissue valve #21 mm. Did have post op atrial fib - was on amiodarone. Other issues include HLD, HTN, DM, depression and hypothyroidism. Myoview ok from 06/2013. Last echo 12/2014.   Last seen back in March of 2016. Seemed to be doing ok.  I saw her just a few weeks ago as a work in visit - she was short of breath. Labs looked ok. Blood count actually improved. Echo updated - did have elevated filling pressures but otherwise unchanged. Prosthetic AV ok. Normal EF and had grade 1 diastolic dysfunction. I wanted to put her on low dose Lasix but she did not wish to proceed due to having frequent urination when on Invokana.   Back here today. Here alone today. She feels a little better - not clear as to why. Walking a little more. Weight is down a few pounds. No chest pain. No swelling. Not dizzy or lightheaded. She saw Dr. Dwyane Dee last week - discussed with him possible low dose diuretic and he was in agreement as well. She is watching her salt.   Past Medical History  Diagnosis Date  . Thyroid disease   . Diabetes mellitus   . Lumbago   . Aortic valve disorders     S/P AVR 06/2012  . Hyperlipidemia   . Heart murmur   . Hypertension   . Hypothyroidism   . Depression   . Shortness of breath   . Asthma     AS CHILD   . Arthritis     Past Surgical History  Procedure Laterality Date  . Tubal ligation    . Breast surgery      LT BREAST BX  BENIGN  . Aortic valve replacement N/A 06/25/2012    Procedure: AORTIC VALVE REPLACEMENT (AVR);  Surgeon: Grace Isaac, MD;  Location: [facility];  Service: Open Heart Surgery;  Laterality: N/A;  . Coronary artery bypass graft N/A 06/25/2012    Procedure: Coronary artery bypass graft  times two using left internal mammary artery and right leg greater saphenous vein;  Surgeon: Grace Isaac, MD;  Location: [facility];  Service: Open Heart Surgery;  Laterality: N/A;  . Intraoperative transesophageal echocardiogram N/A 06/25/2012    Procedure: INTRAOPERATIVE TRANSESOPHAGEAL ECHOCARDIOGRAM;  Surgeon: Grace Isaac, MD;  Location: [facility];  Service: Open Heart Surgery;  Laterality: N/A;     Medications: Current Outpatient Prescriptions  Medication Sig Dispense Refill  . aspirin EC 81 MG tablet Take 1 tablet (81 mg total) by mouth daily.    . benazepril (LOTENSIN) 20 MG tablet Take by mouth daily. Take 1/2 tablet by mouth daily    . calcium carbonate (TUMS) 500 MG chewable tablet Chew 1 tablet by mouth daily as needed for heartburn (indigestion).    . Canagliflozin-Metformin HCl (INVOKAMET) 50-1000 MG TABS Take 1  tablet by mouth 2 (two) times daily. 60 tablet 3  . carvedilol (COREG) 12.5 MG tablet TAKE 1 TABLET (12.5 MG TOTAL) BY MOUTH 2 (TWO) TIMES DAILY. 60 tablet 6  . cholecalciferol (VITAMIN D) 1000 UNITS tablet Take 1,000 Units by mouth daily.    . Coenzyme Q10 (CO Q-10) 100 MG CAPS Take 1 capsule by mouth 2 (two) times daily.    . felodipine (PLENDIL) 10 MG 24 hr tablet Take 1 tablet (10 mg total) by mouth daily. 90 tablet 3  . Insulin Detemir (LEVEMIR) 100 UNIT/ML Pen Inject 22 Units into the skin daily before breakfast. 15 mL 11  . insulin lispro (HUMALOG) 100 UNIT/ML KiwkPen Inject 0.06-0.08 mLs (6-8 Units total) into the skin 2 (two) times daily. 15 mL 3  . Insulin Pen Needle (BD PEN NEEDLE NANO U/F) 32G X 4 MM MISC Use 3 per day 100 each 3  . levothyroxine (SYNTHROID, LEVOTHROID) 75 MCG  tablet TAKE 1 TABLET BY MOUTH EVERY DAY 30 tablet 5  . metFORMIN (GLUCOPHAGE-XR) 750 MG 24 hr tablet Take 750 mg by mouth daily with breakfast.    . Multiple Vitamins-Minerals (MULTIVITAMINS THER. W/MINERALS) TABS Take 1 tablet by mouth daily.    . rosuvastatin (CRESTOR) 20 MG tablet Take 1 tablet (20 mg total) by mouth daily. 30 tablet 3  . furosemide (LASIX) 20 MG tablet Take 1 tablet (20 mg total) by mouth daily as needed for fluid or edema (or weight gain). 30 tablet 6   No current facility-administered medications for this visit.    Allergies: No Known Allergies  Social History: The patient  reports that she has never smoked. She does not have any smokeless tobacco history on file. She reports that she does not drink alcohol or use illicit drugs.   Family History: The patient's family history includes Heart disease in her father.   Review of Systems: Please see the history of present illness.   Otherwise, the review of systems is positive for shortness of breath, depression and fatigue.   All other systems are reviewed and negative.   Physical Exam: VS:  BP 122/70 mmHg  Pulse 72  Ht 5\' 3"  (1.6 m)  Wt 179 lb 12.8 oz (81.557 kg)  BMI 31.86 kg/m2  SpO2 98% .  BMI Body mass index is 31.86 kg/(m^2).  Wt Readings from Last 3 Encounters:  02/02/15 179 lb 12.8 oz (81.557 kg)  01/28/15 182 lb (82.555 kg)  01/19/15 180 lb 1.9 oz (81.702 kg)    General: Pleasant. Well developed, well nourished and in no acute distress. She remains obese.  HEENT: Normal. Neck: Supple, no JVD, carotid bruits, or masses noted.  Cardiac: Regular rate and rhythm. Very soft outflow murmur. No edema.  Respiratory:  Lungs are clear to auscultation bilaterally with normal work of breathing.  GI: Soft and nontender.  MS: No deformity or atrophy. Gait and ROM intact. Skin: Warm and dry. Color is normal.  Neuro:  Strength and sensation are intact and no gross focal deficits noted.  Psych: Alert, appropriate  and with normal affect.   LABORATORY DATA:  EKG:  EKG is not ordered today.  Lab Results  Component Value Date   WBC 5.8 01/19/2015   HGB 11.2* 01/19/2015   HCT 34.7* 01/19/2015   PLT 222.0 01/19/2015   GLUCOSE 92 01/25/2015   CHOL 158 11/24/2014   TRIG 59.0 11/24/2014   HDL 53.80 11/24/2014   LDLDIRECT 102.8 11/28/2013   LDLCALC 92 11/24/2014   ALT  10 01/19/2015   AST 18 01/19/2015   NA 138 01/25/2015   K 4.0 01/25/2015   CL 101 01/25/2015   CREATININE 0.83 01/25/2015   BUN 20 01/25/2015   CO2 30 01/25/2015   TSH 2.05 11/24/2014   INR 1.57* 06/25/2012   HGBA1C 7.4* 11/24/2014   MICROALBUR 15.2* 08/28/2014    BNP (last 3 results) No results for input(s): BNP in the last 8760 hours.  ProBNP (last 3 results)  Recent Labs  01/19/15 1239  PROBNP 121.0*     Other Studies Reviewed Today:  Echo Study Conclusions from 12/2014  - Left ventricle: The cavity size was normal. There was mild focal basal hypertrophy of the septum. Systolic function was normal. The estimated ejection fraction was in the range of 55% to 60%. Wall motion was normal; there were no regional wall motion abnormalities. There was an increased relative contribution of atrial contraction to ventricular filling. Doppler parameters are consistent with abnormal left ventricular relaxation (grade 1 diastolic dysfunction). Doppler parameters are consistent with high ventricular filling pressure. - Aortic valve: Poorly visualized. A bioprosthesis was present and functioning normally. Peak velocity (S): 248 cm/s. Mean gradient (S): 12 mm Hg. Valve area (VTI): 0.94 cm^2. Valve area (Vmax): 0.97 cm^2. Valve area (Vmean): 0.95 cm^2. - Mitral valve: Calcified annulus. Mild diffuse thickening. There was mild regurgitation. - Left atrium: The atrium was mildly dilated. - Pulmonic valve: There was trivial regurgitation.    Myoview Impression from 06/2013 Exercise Capacity:  Lexiscan with no exercise. BP Response: Normal blood pressure response. Clinical Symptoms: No significant symptoms noted. ECG Impression: There are scattered PVCs. Comparison with Prior Nuclear Study: No previous nuclear study performed  Overall Impression: Low risk stress nuclear study small fixed distal anterior artifact which could be breast attenuation or other artifact.  LV Ejection Fraction: 66%. LV Wall Motion: Normal Wall Motion  [name], MD, [facility]    Assessment/Plan: 1. CAD - s/p CABG 06-27-12 -last Myoview stable from 2015. No active chest pain.    2. Aortic stenosis with prior AVR - echo is ok. Did have elevated filling pressures - she is agreeable to trying low dose Lasix prn. Will see her back in 2 months and see how she does - if fails to improve would consider pulmonary referral.   3. Post op atrial fibrillation - remains in NSR by exam today.  4. HTN - her blood pressures well-controlled. I have left her on her current regimen for now.   Current medicines are reviewed with the patient today.  The patient does not have concerns regarding medicines other than what has been noted above.  The following changes have been made:  See above.  Labs/ tests ordered today include:   No orders of the defined types were placed in this encounter.     Disposition:   FU with Dr. Acie Fredrickson as planned.    Patient is agreeable to this plan and will call if any problems develop in the interim.   Signed: Burtis Junes, RN, ANP-C 02/02/2015 10:19 AM  [facility] [address] [place], [place]  [address] Phone: [phone] Fax: [phone]

## 2015-02-16 DIAGNOSIS — K0520 Aggressive periodontitis, unspecified: Secondary | ICD-10-CM | POA: Diagnosis not present

## 2015-02-16 DIAGNOSIS — J309 Allergic rhinitis, unspecified: Secondary | ICD-10-CM | POA: Diagnosis not present

## 2015-02-16 DIAGNOSIS — E119 Type 2 diabetes mellitus without complications: Secondary | ICD-10-CM | POA: Diagnosis not present

## 2015-02-16 DIAGNOSIS — I10 Essential (primary) hypertension: Secondary | ICD-10-CM | POA: Diagnosis not present

## 2015-03-07 ENCOUNTER — Other Ambulatory Visit: Payer: Self-pay | Admitting: Endocrinology

## 2015-03-09 ENCOUNTER — Other Ambulatory Visit: Payer: Self-pay | Admitting: Endocrinology

## 2015-03-30 ENCOUNTER — Encounter: Payer: Self-pay | Admitting: Podiatry

## 2015-03-30 ENCOUNTER — Ambulatory Visit (INDEPENDENT_AMBULATORY_CARE_PROVIDER_SITE_OTHER): Payer: Medicare Other | Admitting: Podiatry

## 2015-03-30 DIAGNOSIS — M79676 Pain in unspecified toe(s): Secondary | ICD-10-CM

## 2015-03-30 DIAGNOSIS — B351 Tinea unguium: Secondary | ICD-10-CM | POA: Diagnosis not present

## 2015-03-30 NOTE — Progress Notes (Signed)
Patient ID: [name], female   DOB: 06/22/1932, 81 y.o.   MRN: [id number]  Subjective: This patient presents for scheduled visit complaining of painful toenails when walking wearing shoes and requests nail debridement  Objective: No open skin lesions bilaterally Hammertoe second bilaterally Minimal plantar keratoses right The toenails are elongated, brittle, discolored, incurvated and tender to direct palpation 6-10  Assessment: Diabetic type II with a history of satisfactory neurovascular status Symptomatic onychomycoses 6-10 Plantar keratoses 1  Plan: Debrided toenails 10 and mechanically and electrically without a bleeding Debride plantar keratoses 1 without a bleeding  Reappoint 3 months

## 2015-03-30 NOTE — Patient Instructions (Signed)
Diabetes and Foot Care Diabetes may cause you to have problems because of poor blood supply (circulation) to your feet and legs. This may cause the skin on your feet to become thinner, break easier, and heal more slowly. Your skin may become dry, and the skin may peel and crack. You may also have nerve damage in your legs and feet causing decreased feeling in them. You may not notice minor injuries to your feet that could lead to infections or more serious problems. Taking care of your feet is one of the most important things you can do for yourself.  HOME CARE INSTRUCTIONS  Wear shoes at all times, even in the house. Do not go barefoot. Bare feet are easily injured.  Check your feet daily for blisters, cuts, and redness. If you cannot see the bottom of your feet, use a mirror or ask someone for help.  Wash your feet with warm water (do not use hot water) and mild soap. Then pat your feet and the areas between your toes until they are completely dry. Do not soak your feet as this can dry your skin.  Apply a moisturizing lotion or petroleum jelly (that does not contain alcohol and is unscented) to the skin on your feet and to dry, brittle toenails. Do not apply lotion between your toes.  Trim your toenails straight across. Do not dig under them or around the cuticle. File the edges of your nails with an emery board or nail file.  Do not cut corns or calluses or try to remove them with medicine.  Wear clean socks or stockings every day. Make sure they are not too tight. Do not wear knee-high stockings since they may decrease blood flow to your legs.  Wear shoes that fit properly and have enough cushioning. To break in new shoes, wear them for just a few hours a day. This prevents you from injuring your feet. Always look in your shoes before you put them on to be sure there are no objects inside.  Do not cross your legs. This may decrease the blood flow to your feet.  If you find a minor scrape,  cut, or break in the skin on your feet, keep it and the skin around it clean and dry. These areas may be cleansed with mild soap and water. Do not cleanse the area with peroxide, alcohol, or iodine.  When you remove an adhesive bandage, be sure not to damage the skin around it.  If you have a wound, look at it several times a day to make sure it is healing.  Do not use heating pads or hot water bottles. They may burn your skin. If you have lost feeling in your feet or legs, you may not know it is happening until it is too late.  Make sure your health care provider performs a complete foot exam at least annually or more often if you have foot problems. Report any cuts, sores, or bruises to your health care provider immediately. SEEK MEDICAL CARE IF:   You have an injury that is not healing.  You have cuts or breaks in the skin.  You have an ingrown nail.  You notice redness on your legs or feet.  You feel burning or tingling in your legs or feet.  You have pain or cramps in your legs and feet.  Your legs or feet are numb.  Your feet always feel cold. SEEK IMMEDIATE MEDICAL CARE IF:   There is increasing redness,   swelling, or pain in or around a wound.  There is a red line that goes up your leg.  Pus is coming from a wound.  You develop a fever or as directed by your health care provider.  You notice a bad smell coming from an ulcer or wound.   This information is not intended to replace advice given to you by your health care provider. Make sure you discuss any questions you have with your health care provider.   Document Released: 04/14/2000 Document Revised: 12/18/2012 Document Reviewed: 09/24/2012 Elsevier Interactive Patient Education 2016 [employer]  

## 2015-04-01 ENCOUNTER — Other Ambulatory Visit: Payer: Medicare Other

## 2015-04-02 ENCOUNTER — Other Ambulatory Visit (INDEPENDENT_AMBULATORY_CARE_PROVIDER_SITE_OTHER): Payer: Medicare Other

## 2015-04-02 DIAGNOSIS — IMO0002 Reserved for concepts with insufficient information to code with codable children: Secondary | ICD-10-CM

## 2015-04-02 DIAGNOSIS — E1165 Type 2 diabetes mellitus with hyperglycemia: Secondary | ICD-10-CM | POA: Diagnosis not present

## 2015-04-02 DIAGNOSIS — E039 Hypothyroidism, unspecified: Secondary | ICD-10-CM

## 2015-04-02 LAB — TSH: TSH: 1.02 u[IU]/mL (ref 0.35–4.50)

## 2015-04-02 LAB — BASIC METABOLIC PANEL
BUN: 17 mg/dL (ref 6–23)
CALCIUM: 9.8 mg/dL (ref 8.4–10.5)
CO2: 28 mEq/L (ref 19–32)
CREATININE: 0.94 mg/dL (ref 0.40–1.20)
Chloride: 102 mEq/L (ref 96–112)
GFR: 73.68 mL/min (ref >60.00)
Glucose, Bld: 119 mg/dL — ABNORMAL HIGH (ref 70–99)
Potassium: 3.9 mEq/L (ref 3.5–5.1)
Sodium: 139 mEq/L (ref 135–145)

## 2015-04-02 LAB — HEMOGLOBIN A1C: Hgb A1c MFr Bld: 7.6 % — ABNORMAL HIGH (ref 4.6–6.5)

## 2015-04-05 ENCOUNTER — Encounter: Payer: Self-pay | Admitting: Endocrinology

## 2015-04-05 ENCOUNTER — Ambulatory Visit (INDEPENDENT_AMBULATORY_CARE_PROVIDER_SITE_OTHER): Payer: Medicare Other | Admitting: Endocrinology

## 2015-04-05 VITALS — BP 124/78 | HR 75 | Temp 98.3°F | Resp 14 | Ht 63.0 in | Wt 181.2 lb

## 2015-04-05 DIAGNOSIS — E038 Other specified hypothyroidism: Secondary | ICD-10-CM | POA: Diagnosis not present

## 2015-04-05 DIAGNOSIS — Z794 Long term (current) use of insulin: Secondary | ICD-10-CM | POA: Diagnosis not present

## 2015-04-05 DIAGNOSIS — E1165 Type 2 diabetes mellitus with hyperglycemia: Secondary | ICD-10-CM | POA: Diagnosis not present

## 2015-04-05 DIAGNOSIS — E063 Autoimmune thyroiditis: Secondary | ICD-10-CM

## 2015-04-05 NOTE — Patient Instructions (Addendum)
Change Levemir to dinner but if sugar is still > 140 then go up to 24  Extra 3 units for fried food and 2 LESS if eating less Carbs  Check COST of BASAGLAR  Check blood sugars on waking up   times a week Also check blood sugars about 2 hours after a meal and do this after different meals by rotation  Recommended blood sugar levels on waking up is 90-130 and about 2 hours after meal is 130-160  Please bring your blood sugar monitor to each visit, thank you  Check blood sugars on waking up 3-4  times a week Also check blood sugars about 2 hours after a meal and do this after different meals by rotation  Recommended blood sugar levels on waking up is 90-130 and about 2 hours after meal is 130-160  Please bring your blood sugar monitor to each visit, thank you  Take Invokamet with breakfast

## 2015-04-05 NOTE — Progress Notes (Signed)
[name] 81 y.o.            Reason for Appointment: Diabetes follow-up   History of Present Illness   Diagnosis: Type 2 DIABETES MELITUS, date of diagnosis:   2000   She has been on insulin since 2004 with difficulty controlling adequately because of compliance with diet and variability in blood sugars. Her previous records are not available at present, however she has been on basal insulin with mealtime coverage since at least 2007 She has been tried on Byetta previously but did not tolerate this well She tried Victoza but didn't not benefit from Victoza 0.6 dosage; had nausea from 1.2 mg; did not continue this because of this side effect  RECENT history:    INSULIN: Levemir 22 units daily in am, HUMALOG 6-8 acb, 6-8 pc lunch and 4 at supper    Overall she has had somewhat improved control with continuing low dose Invokamet which she is tolerating Also on metformin ER separately which she takes in the evening. Her last A1c is still about the same at 7.6 On her last visit she was reminded to take her insulin for her lunchtime coverage consistently before eating  She has the following blood sugar patterns and problems identified  She is taking her Humalog before lunch more consistently with a couple of times a week will forget some of: However she is eating at home mostly  She now reveals that she is eating a lot of fried food at lunchtime and frequently blood sugars are over 200  Postprandial readings after supper are variable but for higher the week of Thanksgiving.  She thinks that she is eating mostly a snack with some fruit at suppertime and not a complete meal  FASTING blood sugars are consistently high and averaging nearly 160 with her taking Levemir, was doing better with Lantus previously which was more expensive  She is not adjusting her insulin based on the type of meal or carbohydrate intake; yesterday at lunch she had no significant carbohydrate and took 8  units of Humalog, postprandial reading was only 73 at 4 PM  Blood glucose readings are usually not checked after breakfast but appear to be fairly good including on the lab  Oral hypoglycemic drugs: Metformin 750 mg at supper, Invokamet 50/1000 at 9 am     .         Monitors blood glucose:  about 3 times daily     Glucometer: One Touch.          Blood Glucose readings   Mean values apply above for all meters except median for One Touch  PRE-MEAL Fasting Lunch Dinner Bedtime Overall  Glucose range:  132-203   115      Mean/median:  156     156   POST-MEAL PC Breakfast PC Lunch PC Dinner  Glucose range:  163   73-226   46-337   Mean/median:   218      Meals: 3 meals per day:  breakfast is mostly peanut butter or cheese crackers lunch 12-2 pm, dinner 5 pm Physical activity: exercise: walks upto 30 min; 1-3/7   days  Wt Readings from Last 3 Encounters:  04/05/15 181 lb 3.2 oz (82.192 kg)  02/02/15 179 lb 12.8 oz (81.557 kg)  01/28/15 182 lb (82.555 kg)    Lab Results  Component Value Date   HGBA1C 7.6* 04/02/2015   HGBA1C 7.4* 11/24/2014   HGBA1C 7.5* 08/28/2014   Lab Results  Component  Value Date   MICROALBUR 15.2* 08/28/2014   LDLCALC 92 11/24/2014   CREATININE 0.94 04/02/2015    Lab on 04/02/2015  Component Date Value Ref Range Status  . Hgb A1c MFr Bld 04/02/2015 7.6* 4.6 - 6.5 % Final   Glycemic Control Guidelines for People with Diabetes:Non Diabetic:  <6%Goal of Therapy: <7%Additional Action Suggested:  >8%   . Sodium 04/02/2015 139  135 - 145 mEq/L Final  . Potassium 04/02/2015 3.9  3.5 - 5.1 mEq/L Final  . Chloride 04/02/2015 102  96 - 112 mEq/L Final  . CO2 04/02/2015 28  19 - 32 mEq/L Final  . Glucose, Bld 04/02/2015 119* 70 - 99 mg/dL Final  . BUN 04/02/2015 17  6 - 23 mg/dL Final  . Creatinine, Ser 04/02/2015 0.94  0.40 - 1.20 mg/dL Final  . Calcium 04/02/2015 9.8  8.4 - 10.5 mg/dL Final  . GFR 04/02/2015 73.68  >60.00 mL/min Final  . TSH 04/02/2015  1.02  0.35 - 4.50 uIU/mL Final       Medication List       This list is accurate as of: 04/05/15  9:58 AM.  Always use your most recent med list.               aspirin EC 81 MG tablet  Take 1 tablet (81 mg total) by mouth daily.     benazepril 20 MG tablet  Commonly known as:  LOTENSIN  Take by mouth daily. Take 1/2 tablet by mouth daily     benazepril 40 MG tablet  Commonly known as:  LOTENSIN  Take 40 mg by mouth daily. Takes 1/2 tablet     Canagliflozin-Metformin HCl 50-1000 MG Tabs  Commonly known as:  INVOKAMET  Take 1 tablet by mouth 2 (two) times daily.     carvedilol 12.5 MG tablet  Commonly known as:  COREG  TAKE 1 TABLET (12.5 MG TOTAL) BY MOUTH 2 (TWO) TIMES DAILY.     cholecalciferol 1000 UNITS tablet  Commonly known as:  VITAMIN D  Take 1,000 Units by mouth daily.     Co Q-10 100 MG Caps  Take 1 capsule by mouth 2 (two) times daily.     felodipine 10 MG 24 hr tablet  Commonly known as:  PLENDIL  Take 1 tablet (10 mg total) by mouth daily.     furosemide 20 MG tablet  Commonly known as:  LASIX  Take 1 tablet (20 mg total) by mouth daily as needed for fluid or edema (or weight gain).     Insulin Detemir 100 UNIT/ML Pen  Commonly known as:  LEVEMIR  Inject 22 Units into the skin daily before breakfast.     insulin lispro 100 UNIT/ML KiwkPen  Commonly known as:  HUMALOG  Inject 0.06-0.08 mLs (6-8 Units total) into the skin 2 (two) times daily.     Insulin Pen Needle 32G X 4 MM Misc  Commonly known as:  BD PEN NEEDLE NANO U/F  Use 3 per day     levothyroxine 75 MCG tablet  Commonly known as:  SYNTHROID, LEVOTHROID  TAKE 1 TABLET BY MOUTH DAILY     levothyroxine 75 MCG tablet  Commonly known as:  SYNTHROID, LEVOTHROID  TAKE 1 TABLET BY MOUTH DAILY     metFORMIN 750 MG 24 hr tablet  Commonly known as:  GLUCOPHAGE-XR  Take 750 mg by mouth daily with breakfast.     multivitamins ther. w/minerals Tabs tablet  Take 1 tablet by mouth  daily.       rosuvastatin 20 MG tablet  Commonly known as:  CRESTOR  Take 1 tablet (20 mg total) by mouth daily.     TUMS 500 MG chewable tablet  Generic drug:  calcium carbonate  Chew 1 tablet by mouth daily as needed for heartburn (indigestion).        Allergies: No Known Allergies  Past Medical History  Diagnosis Date  . Thyroid disease   . Diabetes mellitus   . Lumbago   . Aortic valve disorders     S/P AVR 06/2012  . Hyperlipidemia   . Heart murmur   . Hypertension   . Hypothyroidism   . Depression   . Shortness of breath   . Asthma     AS CHILD   . Arthritis     Past Surgical History  Procedure Laterality Date  . Tubal ligation    . Breast surgery      LT BREAST BX BENIGN  . Aortic valve replacement N/A 06/25/2012    Procedure: AORTIC VALVE REPLACEMENT (AVR);  Surgeon: Grace Isaac, MD;  Location: [facility];  Service: Open Heart Surgery;  Laterality: N/A;  . Coronary artery bypass graft N/A 06/25/2012    Procedure: Coronary artery bypass graft  times two using left internal mammary artery and right leg greater saphenous vein;  Surgeon: Grace Isaac, MD;  Location: [facility];  Service: Open Heart Surgery;  Laterality: N/A;  . Intraoperative transesophageal echocardiogram N/A 06/25/2012    Procedure: INTRAOPERATIVE TRANSESOPHAGEAL ECHOCARDIOGRAM;  Surgeon: Grace Isaac, MD;  Location: [facility];  Service: Open Heart Surgery;  Laterality: N/A;    Family History  Problem Relation Age of Onset  . Heart disease Father     Social History:  reports that she has never smoked. She does not have any smokeless tobacco history on file. She reports that she does not drink alcohol or use illicit drugs.  Review of Systems:   HYPERTENSION: taking multiple drugs and blood pressure is well controlled,  HYPOTHYROIDISM: She has had long-standing hypothyroidism  Her dosage has been changed in 9/15. She has had consistent TSH level with reducing her dose to 75 g  Lab Results   Component Value Date   TSH 1.02 04/02/2015   TSH 2.05 11/24/2014   TSH 2.81 03/11/2014   FREET4 1.07 03/11/2014   FREET4 1.01 09/25/2013   FREET4 1.40 07/28/2013    HYPERLIPIDEMIA: The lipid abnormality consists of elevated LDL; has been  controlled with Crestor 20 mg., still taking brand name Being managed by PCP.   LDL  below 100    Lab Results  Component Value Date   CHOL 158 11/24/2014   HDL 53.80 11/24/2014   LDLCALC 92 11/24/2014   LDLDIRECT 102.8 11/28/2013   TRIG 59.0 11/24/2014   CHOLHDL 3 11/24/2014  '    She does see a podiatrist regularly for toenails trimming No numbness  No recent swelling of her legs   Examination:   BP 124/78 mmHg  Pulse 75  Temp(Src) 98.3 F (36.8 C)  Resp 14  Ht 5\' 3"  (1.6 m)  Wt 181 lb 3.2 oz (82.192 kg)  BMI 32.11 kg/m2  SpO2 99%  Body mass index is 32.11 kg/(m^2).   No pedal edema Diabetic foot exam shows normal monofilament sensation in the toes and plantar surfaces, no skin lesions or ulcers on the feet and normal pedal pulses She does have flat feet  ASSESSMENT/ PLAN:   Diabetes type 2,  uncontrolled   See history of present illness for discussion on her recent management and problems identified Again her blood sugars are not consistently controlled, both fasting and postprandial especially after lunch Although she is improving her compliance with taking her insulin with her main meal at lunch she is now not watching her diet consistently and eating fried food which tends to cause significant hyperglycemia in the afternoon until suppertime  A1c is still relatively higher at 7.6  Recommendations are in patient instructions  She will change her Levemir to the evening for better fasting glucose control  Increase lunchtime dose for higher fat meals  Reduce suppertime dose when eating little carbohydrate  Also may reduce lunchtime dose if reducing carb content  More consistent exercise  Take Invokana right at  breakfast instead of midmorning  Consider Basaglarwhen available  HYPERTENSION: Her blood pressure is controlled   HYPOTHYROID: Adequately controlled    Counseling time on subjects discussed above is over 50% of today's 25 minute visit   Ladarius Seubert 04/05/2015, 9:58 AM   Note: This office note was prepared with Estate agent. Any transcriptional errors that result from this process are unintentional.

## 2015-04-07 ENCOUNTER — Ambulatory Visit (INDEPENDENT_AMBULATORY_CARE_PROVIDER_SITE_OTHER): Payer: Medicare Other | Admitting: Nurse Practitioner

## 2015-04-07 ENCOUNTER — Encounter: Payer: Self-pay | Admitting: Nurse Practitioner

## 2015-04-07 VITALS — BP 120/74 | HR 71 | Ht 63.5 in | Wt 180.4 lb

## 2015-04-07 DIAGNOSIS — Z954 Presence of other heart-valve replacement: Secondary | ICD-10-CM

## 2015-04-07 DIAGNOSIS — E785 Hyperlipidemia, unspecified: Secondary | ICD-10-CM

## 2015-04-07 DIAGNOSIS — I10 Essential (primary) hypertension: Secondary | ICD-10-CM

## 2015-04-07 DIAGNOSIS — Z951 Presence of aortocoronary bypass graft: Secondary | ICD-10-CM

## 2015-04-07 DIAGNOSIS — Z952 Presence of prosthetic heart valve: Secondary | ICD-10-CM

## 2015-04-07 LAB — BASIC METABOLIC PANEL
BUN: 19 mg/dL (ref 7–25)
CO2: 28 mmol/L (ref 20–31)
Calcium: 9.8 mg/dL (ref 8.6–10.4)
Chloride: 100 mmol/L (ref 98–110)
Creat: 0.86 mg/dL (ref 0.60–0.88)
Glucose, Bld: 128 mg/dL — ABNORMAL HIGH (ref 65–99)
Potassium: 4 mmol/L (ref 3.5–5.3)
Sodium: 138 mmol/L (ref 135–146)

## 2015-04-07 NOTE — Patient Instructions (Addendum)
We will be checking the following labs today - BMET   Medication Instructions:    Continue with your current medicines.     Testing/Procedures To Be Arranged:  N/A  Follow-Up:   See me in 4 months    Other Special Instructions:   Keep trying to restrict your salt.     If you need a refill on your cardiac medications before your next appointment, please call your pharmacy.   Call the [facility] office at [phone] if you have any questions, problems or concerns.

## 2015-04-07 NOTE — Progress Notes (Signed)
CARDIOLOGY OFFICE NOTE  Date:  04/07/2015    [name] Date of Birth: 03/06/1933 Medical Record [id number]  PCP:  Kevan Ny, MD  Cardiologist:  Nahser    Chief Complaint  Patient presents with  . Coronary Artery Disease    Follow up visit - seen for Dr. Acie Fredrickson  . Chest Pain    History of Present Illness: [name] is a 81 y.o. female who presents today for a follow up visit. Seen for Dr. Acie Fredrickson. She has a history of DM, prior AVR and CABG x 2 per EBG back in 2014 - this was with a LIMA to the LAD and SVG to the LCX, with a pericardial Edwards tissue valve #21 mm. Did have post op atrial fib - was on amiodarone. Other issues include HLD, HTN, DM, depression and hypothyroidism. Myoview ok from 06/2013. Last echo 12/2014.   Last seen back in March of 2016. Seemed to be doing ok.  I saw her back in September for a work in visit - she was short of breath. Labs looked ok. Blood count actually improved. Echo updated - did have elevated filling pressures but otherwise unchanged. Prosthetic AV ok. Normal EF and had grade 1 diastolic dysfunction. I wanted to put her on low dose Lasix but she did not wish to proceed due to having frequent urination when on Invokana.   Seen 2 months ago and she was doing ok- not really clear as to why. She had talked with Dr. Dwyane Dee and he also encouraged prn diuretic. She was agreeable to trying.   Back here today. Here alone today. Doing ok. Much better. Using extra Lasix and this has helped. BP is good. She is pleased with how she is doing. Weight is stable. No chest pain. Tolerating her medicines. She is trying to restrict her salt.    Past Medical History  Diagnosis Date  . Thyroid disease   . Diabetes mellitus   . Lumbago   . Aortic valve disorders     S/P AVR 06/2012  . Hyperlipidemia   . Heart murmur   . Hypertension   . Hypothyroidism   . Depression   . Shortness of breath   . Asthma     AS CHILD   . Arthritis     Past  Surgical History  Procedure Laterality Date  . Tubal ligation    . Breast surgery      LT BREAST BX BENIGN  . Aortic valve replacement N/A 06/25/2012    Procedure: AORTIC VALVE REPLACEMENT (AVR);  Surgeon: Grace Isaac, MD;  Location: [facility];  Service: Open Heart Surgery;  Laterality: N/A;  . Coronary artery bypass graft N/A 06/25/2012    Procedure: Coronary artery bypass graft  times two using left internal mammary artery and right leg greater saphenous vein;  Surgeon: Grace Isaac, MD;  Location: [facility];  Service: Open Heart Surgery;  Laterality: N/A;  . Intraoperative transesophageal echocardiogram N/A 06/25/2012    Procedure: INTRAOPERATIVE TRANSESOPHAGEAL ECHOCARDIOGRAM;  Surgeon: Grace Isaac, MD;  Location: [facility];  Service: Open Heart Surgery;  Laterality: N/A;     Medications: Current Outpatient Prescriptions  Medication Sig Dispense Refill  . aspirin EC 81 MG tablet Take 1 tablet (81 mg total) by mouth daily.    . benazepril (LOTENSIN) 20 MG tablet Take by mouth daily. Take 1/2 tablet by mouth daily    . calcium carbonate (TUMS) 500 MG chewable tablet Chew 1 tablet  by mouth daily as needed for heartburn (indigestion).    . Canagliflozin-Metformin HCl (INVOKAMET) 50-1000 MG TABS Take 1 tablet by mouth 2 (two) times daily. 60 tablet 3  . carvedilol (COREG) 12.5 MG tablet TAKE 1 TABLET (12.5 MG TOTAL) BY MOUTH 2 (TWO) TIMES DAILY. 60 tablet 6  . cholecalciferol (VITAMIN D) 1000 UNITS tablet Take 1,000 Units by mouth daily.    . Coenzyme Q10 (CO Q-10) 100 MG CAPS Take 1 capsule by mouth 2 (two) times daily.    . felodipine (PLENDIL) 10 MG 24 hr tablet Take 1 tablet (10 mg total) by mouth daily. 90 tablet 3  . furosemide (LASIX) 20 MG tablet Take 1 tablet (20 mg total) by mouth daily as needed for fluid or edema (or weight gain). 30 tablet 6  . Insulin Detemir (LEVEMIR) 100 UNIT/ML Pen Inject 22 Units into the skin daily before breakfast. (Patient taking differently: Inject 22  Units into the skin at bedtime. ) 15 mL 11  . insulin lispro (HUMALOG) 100 UNIT/ML KiwkPen Inject 0.06-0.08 mLs (6-8 Units total) into the skin 2 (two) times daily. 15 mL 3  . Insulin Pen Needle (BD PEN NEEDLE NANO U/F) 32G X 4 MM MISC Use 3 per day 100 each 3  . levothyroxine (SYNTHROID, LEVOTHROID) 75 MCG tablet TAKE 1 TABLET BY MOUTH DAILY 30 tablet 5  . levothyroxine (SYNTHROID, LEVOTHROID) 75 MCG tablet TAKE 1 TABLET BY MOUTH DAILY 30 tablet 5  . metFORMIN (GLUCOPHAGE-XR) 750 MG 24 hr tablet Take 750 mg by mouth daily with breakfast.    . Multiple Vitamins-Minerals (MULTIVITAMINS THER. W/MINERALS) TABS Take 1 tablet by mouth daily.    . rosuvastatin (CRESTOR) 20 MG tablet Take 1 tablet (20 mg total) by mouth daily. 30 tablet 3   No current facility-administered medications for this visit.    Allergies: No Known Allergies  Social History: The patient  reports that she has never smoked. She does not have any smokeless tobacco history on file. She reports that she does not drink alcohol or use illicit drugs.   Family History: The patient's family history includes Heart disease in her father.   Review of Systems: Please see the history of present illness.   Otherwise, the review of systems is positive for none.   All other systems are reviewed and negative.   Physical Exam: VS:  BP 120/74 mmHg  Pulse 71  Ht 5' 3.5" (1.613 m)  Wt 180 lb 6.4 oz (81.829 kg)  BMI 31.45 kg/m2  SpO2 99% .  BMI Body mass index is 31.45 kg/(m^2).  Wt Readings from Last 3 Encounters:  04/07/15 180 lb 6.4 oz (81.829 kg)  04/05/15 181 lb 3.2 oz (82.192 kg)  02/02/15 179 lb 12.8 oz (81.557 kg)    General: Pleasant. Well developed, well nourished and in no acute distress.  HEENT: Normal. Neck: Supple, no JVD, carotid bruits, or masses noted.  Cardiac: Regular rate and rhythm. Soft outflow murmur. No edema.  Respiratory:  Lungs are clear to auscultation bilaterally with normal work of breathing.  GI:  Soft and nontender.  MS: No deformity or atrophy. Gait and ROM intact. Skin: Warm and dry. Color is normal.  Neuro:  Strength and sensation are intact and no gross focal deficits noted.  Psych: Alert, appropriate and with normal affect.   LABORATORY DATA:  EKG:  EKG is not ordered today.  Lab Results  Component Value Date   WBC 5.8 01/19/2015   HGB 11.2* 01/19/2015  HCT 34.7* 01/19/2015   PLT 222.0 01/19/2015   GLUCOSE 119* 04/02/2015   CHOL 158 11/24/2014   TRIG 59.0 11/24/2014   HDL 53.80 11/24/2014   LDLDIRECT 102.8 11/28/2013   LDLCALC 92 11/24/2014   ALT 10 01/19/2015   AST 18 01/19/2015   NA 139 04/02/2015   K 3.9 04/02/2015   CL 102 04/02/2015   CREATININE 0.94 04/02/2015   BUN 17 04/02/2015   CO2 28 04/02/2015   TSH 1.02 04/02/2015   INR 1.57* 06/25/2012   HGBA1C 7.6* 04/02/2015   MICROALBUR 15.2* 08/28/2014    BNP (last 3 results) No results for input(s): BNP in the last 8760 hours.  ProBNP (last 3 results)  Recent Labs  01/19/15 1239  PROBNP 121.0*     Other Studies Reviewed Today:  Echo Study Conclusions from 12/2014  - Left ventricle: The cavity size was normal. There was mild focal basal hypertrophy of the septum. Systolic function was normal. The estimated ejection fraction was in the range of 55% to 60%. Wall motion was normal; there were no regional wall motion abnormalities. There was an increased relative contribution of atrial contraction to ventricular filling. Doppler parameters are consistent with abnormal left ventricular relaxation (grade 1 diastolic dysfunction). Doppler parameters are consistent with high ventricular filling pressure. - Aortic valve: Poorly visualized. A bioprosthesis was present and functioning normally. Peak velocity (S): 248 cm/s. Mean gradient (S): 12 mm Hg. Valve area (VTI): 0.94 cm^2. Valve area (Vmax): 0.97 cm^2. Valve area (Vmean): 0.95 cm^2. - Mitral valve: Calcified annulus.  Mild diffuse thickening. There was mild regurgitation. - Left atrium: The atrium was mildly dilated. - Pulmonic valve: There was trivial regurgitation.    Myoview Impression from 06/2013 Exercise Capacity: Lexiscan with no exercise. BP Response: Normal blood pressure response. Clinical Symptoms: No significant symptoms noted. ECG Impression: There are scattered PVCs. Comparison with Prior Nuclear Study: No previous nuclear study performed  Overall Impression: Low risk stress nuclear study small fixed distal anterior artifact which could be breast attenuation or other artifact.  LV Ejection Fraction: 66%. LV Wall Motion: Normal Wall Motion  [name], MD, [facility]    Assessment/Plan: 1. CAD - s/p CABG 06-27-12 -last Myoview stable from 2015. No active chest pain.   2. Aortic stenosis with prior AVR - echo is ok. Did have elevated filling pressures - Now using low dose Lasix prn. She has improved. I think we can hold off on referral for pulmonary for now.   3. Post op atrial fibrillation - remains in NSR by exam today.  4. HTN - her blood pressures well-controlled. I have left her on her current regimen for now.   Current medicines are reviewed with the patient today.  The patient does not have concerns regarding medicines other than what has been noted above.  The following changes have been made:  See above.  Labs/ tests ordered today include:    Orders Placed This Encounter  Procedures  . Basic metabolic panel     Disposition:   FU with me in 4 months.   Patient is agreeable to this plan and will call if any problems develop in the interim.   Signed: Burtis Junes, RN, ANP-C 04/07/2015 9:10 AM  [facility] [address] [place], [place]  [address] Phone: [phone] Fax: [phone]

## 2015-05-07 DIAGNOSIS — E039 Hypothyroidism, unspecified: Secondary | ICD-10-CM | POA: Diagnosis not present

## 2015-05-07 DIAGNOSIS — I10 Essential (primary) hypertension: Secondary | ICD-10-CM | POA: Diagnosis not present

## 2015-05-07 DIAGNOSIS — E119 Type 2 diabetes mellitus without complications: Secondary | ICD-10-CM | POA: Diagnosis not present

## 2015-05-07 DIAGNOSIS — Z6832 Body mass index (BMI) 32.0-32.9, adult: Secondary | ICD-10-CM | POA: Diagnosis not present

## 2015-05-27 DIAGNOSIS — H26491 Other secondary cataract, right eye: Secondary | ICD-10-CM | POA: Diagnosis not present

## 2015-05-28 ENCOUNTER — Other Ambulatory Visit: Payer: Self-pay

## 2015-05-28 DIAGNOSIS — Z1231 Encounter for screening mammogram for malignant neoplasm of breast: Secondary | ICD-10-CM

## 2015-06-02 ENCOUNTER — Other Ambulatory Visit (INDEPENDENT_AMBULATORY_CARE_PROVIDER_SITE_OTHER): Payer: Medicare Other

## 2015-06-02 ENCOUNTER — Other Ambulatory Visit: Payer: Self-pay | Admitting: *Deleted

## 2015-06-02 DIAGNOSIS — E038 Other specified hypothyroidism: Secondary | ICD-10-CM

## 2015-06-02 DIAGNOSIS — Z794 Long term (current) use of insulin: Secondary | ICD-10-CM

## 2015-06-02 DIAGNOSIS — E1165 Type 2 diabetes mellitus with hyperglycemia: Secondary | ICD-10-CM | POA: Diagnosis not present

## 2015-06-02 LAB — COMPREHENSIVE METABOLIC PANEL
ALT: 12 U/L (ref 0–35)
AST: 19 U/L (ref 0–37)
Albumin: 4.3 g/dL (ref 3.5–5.2)
Alkaline Phosphatase: 41 U/L (ref 39–117)
BUN: 25 mg/dL — ABNORMAL HIGH (ref 6–23)
CALCIUM: 10.3 mg/dL (ref 8.4–10.5)
CHLORIDE: 101 meq/L (ref 96–112)
CO2: 29 meq/L (ref 19–32)
CREATININE: 1.03 mg/dL (ref 0.40–1.20)
GFR: 66.28 mL/min (ref >60.00)
Glucose, Bld: 70 mg/dL (ref 70–99)
POTASSIUM: 3.9 meq/L (ref 3.5–5.1)
SODIUM: 138 meq/L (ref 135–145)
Total Bilirubin: 0.4 mg/dL (ref 0.2–1.2)
Total Protein: 8.2 g/dL (ref 6.0–8.3)

## 2015-06-02 LAB — T4, FREE: FREE T4: 1.13 ng/dL (ref 0.60–1.60)

## 2015-06-02 LAB — TSH: TSH: 1.06 u[IU]/mL (ref 0.35–4.50)

## 2015-06-03 LAB — FRUCTOSAMINE: FRUCTOSAMINE: 342 umol/L — AB (ref 0–285)

## 2015-06-07 ENCOUNTER — Encounter: Payer: Self-pay | Admitting: Endocrinology

## 2015-06-07 ENCOUNTER — Ambulatory Visit (INDEPENDENT_AMBULATORY_CARE_PROVIDER_SITE_OTHER): Payer: Medicare Other | Admitting: Endocrinology

## 2015-06-07 VITALS — BP 122/66 | HR 82 | Temp 97.7°F | Resp 14 | Ht 63.5 in | Wt 179.2 lb

## 2015-06-07 DIAGNOSIS — E063 Autoimmune thyroiditis: Secondary | ICD-10-CM

## 2015-06-07 DIAGNOSIS — I10 Essential (primary) hypertension: Secondary | ICD-10-CM | POA: Diagnosis not present

## 2015-06-07 DIAGNOSIS — Z794 Long term (current) use of insulin: Secondary | ICD-10-CM | POA: Diagnosis not present

## 2015-06-07 DIAGNOSIS — E038 Other specified hypothyroidism: Secondary | ICD-10-CM

## 2015-06-07 DIAGNOSIS — E1165 Type 2 diabetes mellitus with hyperglycemia: Secondary | ICD-10-CM

## 2015-06-07 NOTE — Patient Instructions (Addendum)
Humalog 5 if having crackers in am  Take Humalog BEFORE lunch  Check blood sugars on waking up   times a week Also check blood sugars about 2 hours after a meal and do this after different meals by rotation  Recommended blood sugar levels on waking up is 90-130 and about 2 hours after meal is 130-160  Please bring your blood sugar monitor to each visit, thank you  Check B12 level

## 2015-06-07 NOTE — Progress Notes (Signed)
[name] 82 y.o.            Reason for Appointment: Diabetes follow-up   History of Present Illness   Diagnosis: Type 2 DIABETES MELITUS, date of diagnosis:   2000   She has been on insulin since 2004 with difficulty controlling adequately because of compliance with diet and variability in blood sugars. Her previous records are not available at present, however she has been on basal insulin with mealtime coverage since at least 2007 She has been tried on Byetta previously but did not tolerate this well She tried Victoza but didn't not benefit from Victoza 0.6 dosage; had nausea from 1.2 mg; did not continue this because of this side effect  RECENT history:    INSULIN: Levemir 22 units daily hs, HUMALOG 6-8 acb, 6-8  lunch and 4 at supper    Overall she has had somewhat improved control with continuing low dose Invokamet which she is tolerating Also on metformin ER separately which she takes in the evening. Her last A1c is still about the same at 7.6 On her last visit she was changed from morning to bedtime Levemir to help her fasting hyperglycemia  She has the following blood sugar patterns and problems identified  She is taking her Humalog before lunch periodically but not consistently  Her highest readings are still after lunch although not consistent and has only 3 readings over 200 and the last month.  Yesterday glucose was 261 in the afternoon because of eating cookies at midday instead of a meal and without any Humalog  FASTING blood sugars are better with using bedtime timing of the Levemir  She has not done many readings after supper but they are fairly good usually  On her lab work her glucose was 70 after breakfast and she does not monitor after her morning meal usually, she may have a higher reading if eating more carbohydrate in the morning such as Cheerios  No hypoglycemia, lowest reading 68 at 5 PM  She is not adjusting her insulin based on the type of meal  or carbohydrate intake; taking usually the same amount of insulin with her meals  Oral hypoglycemic drugs: Metformin 750 mg at supper, Invokamet 50/1000 at 9 am     .         Monitors blood glucose:  about 3 times daily     Glucometer: One Touch.          Blood Glucose readings   Mean values apply above for all meters except median for One Touch  PRE-MEAL Fasting Lunch  afternoon   PCS  Overall  Glucose range:  97-201    72-291   81-198    Mean/median:  132    151   159  139 +/-53     Meals: 3 meals per day:  breakfast is mostly peanut butter or cheese crackers; occ. cheerios; lunch 12-2 pm, dinner 5 pm Physical activity: exercise: walks upto 30 min; 1-3/7   days  Wt Readings from Last 3 Encounters:  06/07/15 179 lb 3.2 oz (81.285 kg)  04/07/15 180 lb 6.4 oz (81.829 kg)  04/05/15 181 lb 3.2 oz (82.192 kg)    Lab Results  Component Value Date   HGBA1C 7.6* 04/02/2015   HGBA1C 7.4* 11/24/2014   HGBA1C 7.5* 08/28/2014   Lab Results  Component Value Date   MICROALBUR 15.2* 08/28/2014   LDLCALC 92 11/24/2014   CREATININE 1.03 06/02/2015    Appointment on 06/02/2015  Component  Date Value Ref Range Status  . Sodium 06/02/2015 138  135 - 145 mEq/L Final  . Potassium 06/02/2015 3.9  3.5 - 5.1 mEq/L Final  . Chloride 06/02/2015 101  96 - 112 mEq/L Final  . CO2 06/02/2015 29  19 - 32 mEq/L Final  . Glucose, Bld 06/02/2015 70  70 - 99 mg/dL Final  . BUN 06/02/2015 25* 6 - 23 mg/dL Final  . Creatinine, Ser 06/02/2015 1.03  0.40 - 1.20 mg/dL Final  . Total Bilirubin 06/02/2015 0.4  0.2 - 1.2 mg/dL Final  . Alkaline Phosphatase 06/02/2015 41  39 - 117 U/L Final  . AST 06/02/2015 19  0 - 37 U/L Final  . ALT 06/02/2015 12  0 - 35 U/L Final  . Total Protein 06/02/2015 8.2  6.0 - 8.3 g/dL Final  . Albumin 06/02/2015 4.3  3.5 - 5.2 g/dL Final  . Calcium 06/02/2015 10.3  8.4 - 10.5 mg/dL Final  . GFR 06/02/2015 66.28  >60.00 mL/min Final  . Fructosamine 06/02/2015 342* 0 - 285 umol/L  Final   Comment: Published reference interval for apparently healthy subjects between age 103 and 21 is 65 - 285 umol/L and in a poorly controlled diabetic population is 228 - 563 umol/L with a mean of 396 umol/L.   Marland Kitchen TSH 06/02/2015 1.06  0.35 - 4.50 uIU/mL Final  . Free T4 06/02/2015 1.13  0.60 - 1.60 ng/dL Final       Medication List       This list is accurate as of: 06/07/15  9:17 PM.  Always use your most recent med list.               aspirin EC 81 MG tablet  Take 1 tablet (81 mg total) by mouth daily.     benazepril 20 MG tablet  Commonly known as:  LOTENSIN  Take by mouth daily. Take 1/2 tablet by mouth daily     Canagliflozin-Metformin HCl 50-1000 MG Tabs  Commonly known as:  INVOKAMET  Take 1 tablet by mouth 2 (two) times daily.     carvedilol 12.5 MG tablet  Commonly known as:  COREG  TAKE 1 TABLET (12.5 MG TOTAL) BY MOUTH 2 (TWO) TIMES DAILY.     cholecalciferol 1000 units tablet  Commonly known as:  VITAMIN D  Take 1,000 Units by mouth daily.     Co Q-10 100 MG Caps  Take 1 capsule by mouth 2 (two) times daily.     donepezil 5 MG tablet  Commonly known as:  ARICEPT  Take 5 mg by mouth at bedtime.     felodipine 10 MG 24 hr tablet  Commonly known as:  PLENDIL  Take 1 tablet (10 mg total) by mouth daily.     furosemide 20 MG tablet  Commonly known as:  LASIX  Take 1 tablet (20 mg total) by mouth daily as needed for fluid or edema (or weight gain).     Insulin Detemir 100 UNIT/ML Pen  Commonly known as:  LEVEMIR  Inject 22 Units into the skin daily before breakfast.     insulin lispro 100 UNIT/ML KiwkPen  Commonly known as:  HUMALOG  Inject 0.06-0.08 mLs (6-8 Units total) into the skin 2 (two) times daily.     Insulin Pen Needle 32G X 4 MM Misc  Commonly known as:  BD PEN NEEDLE NANO U/F  Use 3 per day     levothyroxine 75 MCG tablet  Commonly known as:  SYNTHROID,  LEVOTHROID  TAKE 1 TABLET BY MOUTH DAILY     levothyroxine 75 MCG tablet    Commonly known as:  SYNTHROID, LEVOTHROID  TAKE 1 TABLET BY MOUTH DAILY     metFORMIN 750 MG 24 hr tablet  Commonly known as:  GLUCOPHAGE-XR  Take 750 mg by mouth daily with breakfast.     multivitamins ther. w/minerals Tabs tablet  Take 1 tablet by mouth daily.     rosuvastatin 20 MG tablet  Commonly known as:  CRESTOR  Take 1 tablet (20 mg total) by mouth daily.     TUMS 500 MG chewable tablet  Generic drug:  calcium carbonate  Chew 1 tablet by mouth daily as needed for heartburn (indigestion).        Allergies: No Known Allergies  Past Medical History  Diagnosis Date  . Thyroid disease   . Diabetes mellitus   . Lumbago   . Aortic valve disorders     S/P AVR 06/2012  . Hyperlipidemia   . Heart murmur   . Hypertension   . Hypothyroidism   . Depression   . Shortness of breath   . Asthma     AS CHILD   . Arthritis     Past Surgical History  Procedure Laterality Date  . Tubal ligation    . Breast surgery      LT BREAST BX BENIGN  . Aortic valve replacement N/A 06/25/2012    Procedure: AORTIC VALVE REPLACEMENT (AVR);  Surgeon: Grace Isaac, MD;  Location: [place];  Service: Open Heart Surgery;  Laterality: N/A;  . Coronary artery bypass graft N/A 06/25/2012    Procedure: Coronary artery bypass graft  times two using left internal mammary artery and right leg greater saphenous vein;  Surgeon: Grace Isaac, MD;  Location: [place];  Service: Open Heart Surgery;  Laterality: N/A;  . Intraoperative transesophageal echocardiogram N/A 06/25/2012    Procedure: INTRAOPERATIVE TRANSESOPHAGEAL ECHOCARDIOGRAM;  Surgeon: Grace Isaac, MD;  Location: [place];  Service: Open Heart Surgery;  Laterality: N/A;    Family History  Problem Relation Age of Onset  . Heart disease Father     Social History:  reports that she has never smoked. She does not have any smokeless tobacco history on file. She reports that she does not drink alcohol or use illicit drugs.  Review of  Systems:   HYPERTENSION: taking multiple drugs and blood pressure is well controlled  HYPOTHYROIDISM: She has had long-standing hypothyroidism  Her dosage has been changed in 9/15. She has had consistent TSH level with reducing her dose to 75 g  Lab Results  Component Value Date   TSH 1.06 06/02/2015   TSH 1.02 04/02/2015   TSH 2.05 11/24/2014   FREET4 1.13 06/02/2015   FREET4 1.07 03/11/2014   FREET4 1.01 09/25/2013    HYPERLIPIDEMIA: The lipid abnormality consists of elevated LDL; has been  controlled with Crestor 20 mg., still taking brand name Being managed by PCP.   LDL  below 100    Lab Results  Component Value Date   CHOL 158 11/24/2014   HDL 53.80 11/24/2014   LDLCALC 92 11/24/2014   LDLDIRECT 102.8 11/28/2013   TRIG 59.0 11/24/2014   CHOLHDL 3 11/24/2014  '    She does see a podiatrist regularly for toenails trimming  Last foot exam was in 12/16: Diabetic foot exam shows normal monofilament sensation in the toes and plantar surfaces, no skin lesions or ulcers on the feet and normal  pedal pulses  She was feeling more depressed but she refused to take the treatment given by her PCP, she thinks she is feeling a little better now   Examination:   BP 122/66 mmHg  Pulse 82  Temp(Src) 97.7 F (36.5 C)  Resp 14  Ht 5' 3.5" (1.613 m)  Wt 179 lb 3.2 oz (81.285 kg)  BMI 31.24 kg/m2  SpO2 98%  Body mass index is 31.24 kg/(m^2).     ASSESSMENT/ PLAN:   Diabetes type 2, uncontrolled   See history of present illness for discussion on her recent management and problems identified Although her highest readings are still in the afternoon they are not as frequently occurring She is doing a little better with compliance with her insulin at lunchtime but occasionally will not eat a proper meal at lunch and skip insulin Also her breakfast has variable carbohydrate intake with higher readings with certain meals Her last A1c was 7.6 but her blood sugars averaging only  about 148 now in the evenings  Recommendations are in patient instructions  She will adjust her morning insulin by 2 units based on her carbohydrate intake; make sure to have some protein with breakfast daily  Increase lunchtime dose for higher fat meals and take insulin with her consistently when eating out  Continue increase walking   HYPOTHYROID: Adequately controlled with good TSH now  Depression: Follow-up with PCP, encouraged her to take treatment as needed    Counseling time on subjects discussed above is over 50% of today's 25 minute visit   Doristine Shehan 06/07/2015, 9:17 PM   Note: This office note was prepared with Dragon voice recognition system technology. Any transcriptional errors that result from this process are unintentional.

## 2015-06-09 ENCOUNTER — Ambulatory Visit
Admission: RE | Admit: 2015-06-09 | Discharge: 2015-06-09 | Disposition: A | Discharge status: Home / Self Care | Payer: Medicare Other | Source: Ambulatory Visit

## 2015-06-09 DIAGNOSIS — Z1231 Encounter for screening mammogram for malignant neoplasm of breast: Secondary | ICD-10-CM | POA: Diagnosis not present

## 2015-06-26 ENCOUNTER — Other Ambulatory Visit: Payer: Self-pay | Admitting: Cardiovascular Disease

## 2015-07-06 ENCOUNTER — Ambulatory Visit: Payer: Medicare Other | Admitting: Podiatry

## 2015-07-16 ENCOUNTER — Other Ambulatory Visit: Payer: Self-pay | Admitting: *Deleted

## 2015-07-16 MED ORDER — METFORMIN HCL ER 750 MG PO TB24: 750.0000 mg | ORAL_TABLET | Freq: Every day | ORAL | Status: DC

## 2015-07-19 ENCOUNTER — Telehealth: Payer: Self-pay | Admitting: Endocrinology

## 2015-07-19 NOTE — Telephone Encounter (Signed)
I spoke with [name] about her metformin rx, and the matter was cleared up.

## 2015-07-19 NOTE — Telephone Encounter (Signed)
Pt wanted to leave a message for Suanne Marker to call her back at home today

## 2015-07-28 ENCOUNTER — Ambulatory Visit (INDEPENDENT_AMBULATORY_CARE_PROVIDER_SITE_OTHER): Payer: Medicare Other | Admitting: Podiatry

## 2015-07-28 ENCOUNTER — Encounter: Payer: Self-pay | Admitting: Podiatry

## 2015-07-28 DIAGNOSIS — M79676 Pain in unspecified toe(s): Secondary | ICD-10-CM | POA: Diagnosis not present

## 2015-07-28 DIAGNOSIS — E119 Type 2 diabetes mellitus without complications: Secondary | ICD-10-CM | POA: Diagnosis not present

## 2015-07-28 DIAGNOSIS — B351 Tinea unguium: Secondary | ICD-10-CM

## 2015-07-28 DIAGNOSIS — L84 Corns and callosities: Secondary | ICD-10-CM

## 2015-07-28 NOTE — Patient Instructions (Signed)
Diabetes and Foot Care Diabetes may cause you to have problems because of poor blood supply (circulation) to your feet and legs. This may cause the skin on your feet to become thinner, break easier, and heal more slowly. Your skin may become dry, and the skin may peel and crack. You may also have nerve damage in your legs and feet causing decreased feeling in them. You may not notice minor injuries to your feet that could lead to infections or more serious problems. Taking care of your feet is one of the most important things you can do for yourself.  HOME CARE INSTRUCTIONS  Wear shoes at all times, even in the house. Do not go barefoot. Bare feet are easily injured.  Check your feet daily for blisters, cuts, and redness. If you cannot see the bottom of your feet, use a mirror or ask someone for help.  Wash your feet with warm water (do not use hot water) and mild soap. Then pat your feet and the areas between your toes until they are completely dry. Do not soak your feet as this can dry your skin.  Apply a moisturizing lotion or petroleum jelly (that does not contain alcohol and is unscented) to the skin on your feet and to dry, brittle toenails. Do not apply lotion between your toes.  Trim your toenails straight across. Do not dig under them or around the cuticle. File the edges of your nails with an emery board or nail file.  Do not cut corns or calluses or try to remove them with medicine.  Wear clean socks or stockings every day. Make sure they are not too tight. Do not wear knee-high stockings since they may decrease blood flow to your legs.  Wear shoes that fit properly and have enough cushioning. To break in new shoes, wear them for just a few hours a day. This prevents you from injuring your feet. Always look in your shoes before you put them on to be sure there are no objects inside.  Do not cross your legs. This may decrease the blood flow to your feet.  If you find a minor scrape,  cut, or break in the skin on your feet, keep it and the skin around it clean and dry. These areas may be cleansed with mild soap and water. Do not cleanse the area with peroxide, alcohol, or iodine.  When you remove an adhesive bandage, be sure not to damage the skin around it.  If you have a wound, look at it several times a day to make sure it is healing.  Do not use heating pads or hot water bottles. They may burn your skin. If you have lost feeling in your feet or legs, you may not know it is happening until it is too late.  Make sure your health care provider performs a complete foot exam at least annually or more often if you have foot problems. Report any cuts, sores, or bruises to your health care provider immediately. SEEK MEDICAL CARE IF:   You have an injury that is not healing.  You have cuts or breaks in the skin.  You have an ingrown nail.  You notice redness on your legs or feet.  You feel burning or tingling in your legs or feet.  You have pain or cramps in your legs and feet.  Your legs or feet are numb.  Your feet always feel cold. SEEK IMMEDIATE MEDICAL CARE IF:   There is increasing redness,   swelling, or pain in or around a wound.  There is a red line that goes up your leg.  Pus is coming from a wound.  You develop a fever or as directed by your health care provider.  You notice a bad smell coming from an ulcer or wound.   This information is not intended to replace advice given to you by your health care provider. Make sure you discuss any questions you have with your health care provider.   Document Released: 04/14/2000 Document Revised: 12/18/2012 Document Reviewed: 09/24/2012 Elsevier Interactive Patient Education 2016 [employer]  

## 2015-07-28 NOTE — Progress Notes (Signed)
Patient ID: [name], female   DOB: 07/16/1932, 82 y.o.   MRN: [id number]  Subjective: This patient presents for scheduled visit complaining of painful toenails when walking wearing shoes and requests nail debridement  Objective: No open skin lesions bilaterally Hammertoe second bilaterally Minimal plantar keratoses right The toenails are elongated, brittle, discolored, incurvated and tender to direct palpation 6-10 Corn second and fifth right toes  Assessment: Diabetic type II with a history of satisfactory neurovascular status Symptomatic onychomycoses 6-10  keratoses 3  Plan: Debrided toenails 10 and mechanically and electrically without any bleeding Debride  keratoses 3 without any bleeding  Reappoint 3 months

## 2015-08-02 ENCOUNTER — Other Ambulatory Visit (INDEPENDENT_AMBULATORY_CARE_PROVIDER_SITE_OTHER): Payer: Medicare Other

## 2015-08-02 DIAGNOSIS — Z794 Long term (current) use of insulin: Secondary | ICD-10-CM

## 2015-08-02 DIAGNOSIS — E1165 Type 2 diabetes mellitus with hyperglycemia: Secondary | ICD-10-CM | POA: Diagnosis not present

## 2015-08-02 LAB — COMPREHENSIVE METABOLIC PANEL
ALK PHOS: 37 U/L — AB (ref 39–117)
ALT: 13 U/L (ref 0–35)
AST: 20 U/L (ref 0–37)
Albumin: 4.2 g/dL (ref 3.5–5.2)
BILIRUBIN TOTAL: 0.5 mg/dL (ref 0.2–1.2)
BUN: 22 mg/dL (ref 6–23)
CALCIUM: 9.8 mg/dL (ref 8.4–10.5)
CO2: 29 mEq/L (ref 19–32)
CREATININE: 1.02 mg/dL (ref 0.40–1.20)
Chloride: 100 mEq/L (ref 96–112)
GFR: 67 mL/min (ref >60.00)
Glucose, Bld: 170 mg/dL — ABNORMAL HIGH (ref 70–99)
Potassium: 4 mEq/L (ref 3.5–5.1)
Sodium: 137 mEq/L (ref 135–145)
TOTAL PROTEIN: 7.7 g/dL (ref 6.0–8.3)

## 2015-08-02 LAB — LIPID PANEL
CHOL/HDL RATIO: 3
Cholesterol: 134 mg/dL (ref 0–200)
HDL: 48.3 mg/dL (ref >39.00)
LDL Cholesterol: 69 mg/dL (ref 0–99)
NONHDL: 85.57
Triglycerides: 83 mg/dL (ref 0.0–149.0)
VLDL: 16.6 mg/dL (ref 0.0–40.0)

## 2015-08-02 LAB — MICROALBUMIN / CREATININE URINE RATIO
CREATININE, U: 30.7 mg/dL
MICROALB/CREAT RATIO: 33 mg/g — AB (ref 0.0–30.0)
Microalb, Ur: 10.1 mg/dL — ABNORMAL HIGH (ref 0.0–1.9)

## 2015-08-02 LAB — HEMOGLOBIN A1C: Hgb A1c MFr Bld: 8 % — ABNORMAL HIGH (ref 4.6–6.5)

## 2015-08-03 DIAGNOSIS — H04122 Dry eye syndrome of left lacrimal gland: Secondary | ICD-10-CM | POA: Diagnosis not present

## 2015-08-04 ENCOUNTER — Other Ambulatory Visit: Payer: Self-pay | Admitting: *Deleted

## 2015-08-04 ENCOUNTER — Encounter: Payer: Self-pay | Admitting: Endocrinology

## 2015-08-04 ENCOUNTER — Ambulatory Visit (INDEPENDENT_AMBULATORY_CARE_PROVIDER_SITE_OTHER): Payer: Medicare Other | Admitting: Endocrinology

## 2015-08-04 VITALS — BP 122/84 | HR 77 | Temp 98.1°F | Resp 14 | Ht 63.5 in | Wt 181.4 lb

## 2015-08-04 DIAGNOSIS — I951 Orthostatic hypotension: Secondary | ICD-10-CM | POA: Diagnosis not present

## 2015-08-04 DIAGNOSIS — E1165 Type 2 diabetes mellitus with hyperglycemia: Secondary | ICD-10-CM

## 2015-08-04 DIAGNOSIS — Z794 Long term (current) use of insulin: Secondary | ICD-10-CM | POA: Diagnosis not present

## 2015-08-04 NOTE — Patient Instructions (Signed)
Take 2-4 units at supper if eating any Carbs  Levemir 10 twice daily  Must take Humalog at mealtime  Felodipine 1/2 daily

## 2015-08-04 NOTE — Progress Notes (Signed)
[name] 82 y.o.            Reason for Appointment: Diabetes follow-up   History of Present Illness   Diagnosis: Type 2 DIABETES MELITUS, date of diagnosis:   2000   She has been on insulin since 2004 with difficulty controlling adequately because of compliance with diet and variability in blood sugars. Her previous records are not available at present, however she has been on basal insulin with mealtime coverage since at least 2007 She has been tried on Byetta previously but did not tolerate this well She tried Victoza but didn't not benefit from Victoza 0.6 dosage; had nausea from 1.2 mg; did not continue this because of this side effect  RECENT history:    INSULIN: Levemir 20-22 units daily hs, HUMALOG 6-8 acb, 6-8  lunch and 0-4 at supper    Overall she has had somewhat improved control with continuing low dose Invokamet which she is tolerating Also on metformin ER separately which she takes in the evening. Her last A1c is higher at 8%  On her previous visit she was changed from morning to bedtime Levemir to help her fasting hyperglycemia  She has the following blood sugar patterns and problems identified  She is taking her Humalog mostly after lunch when she is going out to eat and still does not take it as instructed before eating and may have blood sugars occasionally over 400 with this  Although her fasting readings are low normal she tends to have high readings before supper  Recently she is not taking any Humalog at suppertime with a small meal but sometimes will have carbohydrates  Does not check readings after supper  Her highest readings are still after lunch as before  Sometimes will have sweets like cookies at midday instead of a meal   FASTING blood sugars are now low-normal and once had hypoglycemia, she is sometimes taking 20 units instead of 22  She is not adjusting her insulin based on the type of meal or carbohydrate intake; taking usually the  same amount of insulin with her meals  Oral hypoglycemic drugs: Metformin 750 mg at supper, Invokamet 50/1000 at 9 am     .         Monitors blood glucose:  about 2-3 times daily     Glucometer: One Touch.          Blood Glucose readings from download:  Mean values apply above for all meters except median for One Touch  PRE-MEAL Fasting Lunch Dinner Bedtime Overall  Glucose range: 63-125   178-217     Mean/median: 101     152+/-88    POST-MEAL PC Breakfast PC Lunch PC Dinner  Glucose range: 85-191  133-426    Mean/median:       Meals: 3 meals per day:  breakfast is mostly peanut butter or cheese crackers; occ. cheerios; lunch 12-2 pm, dinner 5 pm Physical activity: exercise: walks upto 30 min; 1-3/7   days  Wt Readings from Last 3 Encounters:  08/04/15 181 lb 6.4 oz (82.283 kg)  06/07/15 179 lb 3.2 oz (81.285 kg)  04/07/15 180 lb 6.4 oz (81.829 kg)    Lab Results  Component Value Date   HGBA1C 8.0* 08/02/2015   HGBA1C 7.6* 04/02/2015   HGBA1C 7.4* 11/24/2014   Lab Results  Component Value Date   MICROALBUR 10.1* 08/02/2015   LDLCALC 69 08/02/2015   CREATININE 1.02 08/02/2015    Lab on 08/02/2015  Component Date  Value Ref Range Status  . Hgb A1c MFr Bld 08/02/2015 8.0* 4.6 - 6.5 % Final   Glycemic Control Guidelines for People with Diabetes:Non Diabetic:  <6%Goal of Therapy: <7%Additional Action Suggested:  >8%   . Sodium 08/02/2015 137  135 - 145 mEq/L Final  . Potassium 08/02/2015 4.0  3.5 - 5.1 mEq/L Final  . Chloride 08/02/2015 100  96 - 112 mEq/L Final  . CO2 08/02/2015 29  19 - 32 mEq/L Final  . Glucose, Bld 08/02/2015 170* 70 - 99 mg/dL Final  . BUN 08/02/2015 22  6 - 23 mg/dL Final  . Creatinine, Ser 08/02/2015 1.02  0.40 - 1.20 mg/dL Final  . Total Bilirubin 08/02/2015 0.5  0.2 - 1.2 mg/dL Final  . Alkaline Phosphatase 08/02/2015 37* 39 - 117 U/L Final  . AST 08/02/2015 20  0 - 37 U/L Final  . ALT 08/02/2015 13  0 - 35 U/L Final  . Total Protein  08/02/2015 7.7  6.0 - 8.3 g/dL Final  . Albumin 08/02/2015 4.2  3.5 - 5.2 g/dL Final  . Calcium 08/02/2015 9.8  8.4 - 10.5 mg/dL Final  . GFR 08/02/2015 67.00  >60.00 mL/min Final  . Microalb, Ur 08/02/2015 10.1* 0.0 - 1.9 mg/dL Final  . Creatinine,U 08/02/2015 30.7   Final  . Microalb Creat Ratio 08/02/2015 33.0* 0.0 - 30.0 mg/g Final  . Cholesterol 08/02/2015 134  0 - 200 mg/dL Final   ATP III Classification       Desirable:  < 200 mg/dL               Borderline High:  200 - 239 mg/dL          High:  > = 240 mg/dL  . Triglycerides 08/02/2015 83.0  0.0 - 149.0 mg/dL Final   Normal:  <150 mg/dLBorderline High:  150 - 199 mg/dL  . HDL 08/02/2015 48.30  >39.00 mg/dL Final  . VLDL 08/02/2015 16.6  0.0 - 40.0 mg/dL Final  . LDL Cholesterol 08/02/2015 69  0 - 99 mg/dL Final  . Total CHOL/HDL Ratio 08/02/2015 3   Final                  Men          Women1/2 Average Risk     3.4          3.3Average Risk          5.0          4.42X Average Risk          9.6          7.13X Average Risk          15.0          11.0                      . NonHDL 08/02/2015 85.57   Final   NOTE:  Non-HDL goal should be 30 mg/dL higher than patient's LDL goal (i.e. LDL goal of < 70 mg/dL, would have non-HDL goal of < 100 mg/dL)       Medication List       This list is accurate as of: 08/04/15 10:23 AM.  Always use your most recent med list.               aspirin EC 81 MG tablet  Take 1 tablet (81 mg total) by mouth daily.     benazepril 20 MG tablet  Commonly known as:  LOTENSIN  Take by mouth daily. Take 1/2 tablet by mouth daily     Canagliflozin-Metformin HCl 50-1000 MG Tabs  Commonly known as:  INVOKAMET  Take 1 tablet by mouth 2 (two) times daily.     carvedilol 12.5 MG tablet  Commonly known as:  COREG  TAKE 1 TABLET (12.5 MG TOTAL) BY MOUTH 2 (TWO) TIMES DAILY.     cholecalciferol 1000 units tablet  Commonly known as:  VITAMIN D  Take 1,000 Units by mouth daily.     Co Q-10 100 MG Caps  Take  1 capsule by mouth 2 (two) times daily.     donepezil 5 MG tablet  Commonly known as:  ARICEPT  Take 5 mg by mouth at bedtime.     felodipine 10 MG 24 hr tablet  Commonly known as:  PLENDIL  Take 1 tablet (10 mg total) by mouth daily.     furosemide 20 MG tablet  Commonly known as:  LASIX  Take 1 tablet (20 mg total) by mouth daily as needed for fluid or edema (or weight gain).     Insulin Detemir 100 UNIT/ML Pen  Commonly known as:  LEVEMIR  Inject 22 Units into the skin daily before breakfast.     insulin lispro 100 UNIT/ML KiwkPen  Commonly known as:  HUMALOG  Inject 0.06-0.08 mLs (6-8 Units total) into the skin 2 (two) times daily.     Insulin Pen Needle 32G X 4 MM Misc  Commonly known as:  BD PEN NEEDLE NANO U/F  Use 3 per day     levothyroxine 75 MCG tablet  Commonly known as:  SYNTHROID, LEVOTHROID  TAKE 1 TABLET BY MOUTH DAILY     levothyroxine 75 MCG tablet  Commonly known as:  SYNTHROID, LEVOTHROID  TAKE 1 TABLET BY MOUTH DAILY     metFORMIN 750 MG 24 hr tablet  Commonly known as:  GLUCOPHAGE-XR  Take 1 tablet (750 mg total) by mouth daily with breakfast.     multivitamins ther. w/minerals Tabs tablet  Take 1 tablet by mouth daily.     rosuvastatin 20 MG tablet  Commonly known as:  CRESTOR  Take 1 tablet (20 mg total) by mouth daily.     TUMS 500 MG chewable tablet  Generic drug:  calcium carbonate  Chew 1 tablet by mouth daily as needed for heartburn (indigestion).        Allergies: No Known Allergies  Past Medical History  Diagnosis Date  . Thyroid disease   . Diabetes mellitus   . Lumbago   . Aortic valve disorders     S/P AVR 06/2012  . Hyperlipidemia   . Heart murmur   . Hypertension   . Hypothyroidism   . Depression   . Shortness of breath   . Asthma     AS CHILD   . Arthritis     Past Surgical History  Procedure Laterality Date  . Tubal ligation    . Breast surgery      LT BREAST BX BENIGN  . Aortic valve replacement N/A  06/25/2012    Procedure: AORTIC VALVE REPLACEMENT (AVR);  Surgeon: Grace Isaac, MD;  Location: [place] [facility];  Service: Open Heart Surgery;  Laterality: N/A;  . Coronary artery bypass graft N/A 06/25/2012    Procedure: Coronary artery bypass graft  times two using left internal mammary artery and right leg greater saphenous vein;  Surgeon: Grace Isaac, MD;  Location: [facility];  Service: Open Heart Surgery;  Laterality: N/A;  . Intraoperative transesophageal echocardiogram N/A 06/25/2012    Procedure: INTRAOPERATIVE TRANSESOPHAGEAL ECHOCARDIOGRAM;  Surgeon: Grace Isaac, MD;  Location: [facility];  Service: Open Heart Surgery;  Laterality: N/A;    Family History  Problem Relation Age of Onset  . Heart disease Father     Social History:  reports that she has never smoked. She does not have any smokeless tobacco history on file. She reports that she does not drink alcohol or use illicit drugs.  Review of Systems:   HYPERTENSION: taking multiple drugs andToday appears to have orthostatic drop in blood pressure She sometimes feels lightheaded She was told that she has some vascular changes in her eyes related to hypertension  HYPOTHYROIDISM: She has had long-standing hypothyroidism  She has had consistent TSH level with reducing her dose to 75 g  Lab Results  Component Value Date   TSH 1.06 06/02/2015   TSH 1.02 04/02/2015   TSH 2.05 11/24/2014   FREET4 1.13 06/02/2015   FREET4 1.07 03/11/2014   FREET4 1.01 09/25/2013    HYPERLIPIDEMIA: The lipid abnormality consists of elevated LDL; has been  controlled with Crestor 20 mg Being managed by PCP.   LDL  below 100    Lab Results  Component Value Date   CHOL 134 08/02/2015   HDL 48.30 08/02/2015   LDLCALC 69 08/02/2015   LDLDIRECT 102.8 11/28/2013   TRIG 83.0 08/02/2015   CHOLHDL 3 08/02/2015  '    She does see a podiatrist regularly for toenails trimming  Last foot exam was in 12/16: Diabetic foot exam shows normal  monofilament sensation in the toes and plantar surfaces, no skin lesions or ulcers on the feet and normal pedal pulses     Examination:   BP 122/84 mmHg  Pulse 77  Temp(Src) 98.1 F (36.7 C)  Resp 14  Ht 5' 3.5" (1.613 m)  Wt 181 lb 6.4 oz (82.283 kg)  BMI 31.63 kg/m2  SpO2 97%  Body mass index is 31.63 kg/(m^2).     Standing blood pressure 98/58 right side and 90/55 on the left with large cuff   ASSESSMENT/ PLAN:   Diabetes type 2, uncontrolled   See history of present illness for discussion on her recent management and problems identified She still has difficulty with compliance with her mealtime insulin Also has difficulty getting consistent control of her basal insulin requirement with only once a day Levemir at night, having low normal readings in the mornings and high readings at supper  Although her highest readings are still in the afternoon they are related to noncompliance with her mealtime doses at lunch  Discussed the use of the V-go pump in detail and how this would help her.  Although she is reluctant to have the pump but denies to her skin and she is willing to try 6 days of this We will verify her coverage and expense, forms filled out She will use Humalog with this and do the 20 unit pump, can start with 4 units at breakfast, 6 at lunch and 2 units at suppertime based on meal size   Meanwhile she will try to be better compliant with her lunchtime Humalog  She will take Levemir twice a day, 10 units twice a day for now   HYPOTHYROID: Needs follow-up TSH on the next visit  ORTHOSTATIC hypotension: This may be related to autonomic neuropathy she is not on many diuretics She will reduce her Plendil to half  tablet for now    Counseling time on subjects discussed above is over 50% of today's 25 minute visit   Dresean Beckel 08/04/2015, 10:23 AM   Note: This office note was prepared with Estate agent. Any transcriptional errors  that result from this process are unintentional.

## 2015-08-10 ENCOUNTER — Encounter: Payer: Self-pay | Admitting: Nurse Practitioner

## 2015-08-10 ENCOUNTER — Encounter: Payer: Medicare Other | Admitting: Nutrition

## 2015-08-10 ENCOUNTER — Ambulatory Visit (INDEPENDENT_AMBULATORY_CARE_PROVIDER_SITE_OTHER): Payer: Medicare Other | Admitting: Nurse Practitioner

## 2015-08-10 VITALS — BP 156/80 | HR 80 | Ht 64.0 in | Wt 181.0 lb

## 2015-08-10 DIAGNOSIS — Z951 Presence of aortocoronary bypass graft: Secondary | ICD-10-CM

## 2015-08-10 DIAGNOSIS — E785 Hyperlipidemia, unspecified: Secondary | ICD-10-CM | POA: Diagnosis not present

## 2015-08-10 DIAGNOSIS — I10 Essential (primary) hypertension: Secondary | ICD-10-CM | POA: Diagnosis not present

## 2015-08-10 DIAGNOSIS — Z954 Presence of other heart-valve replacement: Secondary | ICD-10-CM

## 2015-08-10 DIAGNOSIS — Z952 Presence of prosthetic heart valve: Secondary | ICD-10-CM

## 2015-08-10 NOTE — Progress Notes (Signed)
CARDIOLOGY OFFICE NOTE  Date:  08/10/2015    [name] Date of Birth: 1932/08/21 Medical Record [id number]  PCP:  Rogers Blocker, MD  Cardiologist:  Nahser    Chief Complaint  Patient presents with  . Coronary Artery Disease  . Atrial Fibrillation  . Hypertension    4 month check - seen for Dr. Acie Fredrickson    History of Present Illness: [name] is a 82 y.o. female who presents today for a 4 month check. Seen for Dr. Acie Fredrickson.   She has a history of DM, prior AVR and CABG x 2 per EBG back in 2014 - this was with a LIMA to the LAD and SVG to the LCX, with a pericardial Edwards tissue valve #21 mm. Did have post op atrial fib - was on amiodarone. Other issues include HLD, HTN, DM, depression and hypothyroidism. Myoview ok from 06/2013. Last echo 12/2014.   I saw her back in September of 2016 for a work in visit - she was short of breath. Labs looked ok. Blood count actually improved. Echo updated - did have elevated filling pressures but otherwise unchanged. Prosthetic AV ok. Normal EF and had grade 1 diastolic dysfunction. I wanted to put her on low dose Lasix but she did not wish to proceed due to having frequent urination when on Invokana.   Seen in November of 2016 by me and she was doing ok- not really clear as to why she had improved but she had. She had talked with Dr. Dwyane Dee and he also encouraged prn diuretic. She was agreeable to trying. Last seen back in December and she was doing well - using prn diuretics.   Back here today. Here with her friend today. Doing ok. A1C has gone up - she is to consider insulin pump - will be seeing someone tomorrow about that. She has had her medicines today but she is on less BP meds due to "seeing streaks and ?having low BP". Dr. Dwyane Dee cut back her Plendil. She denies chest pain. Not short of breath. Has not really lost any weight and admits she could do better with her activity. No regular exercise. Still using prn diuretic. Overall, she  says she is pleased with how she is doing.   Past Medical History  Diagnosis Date  . Thyroid disease   . Diabetes mellitus   . Lumbago   . Aortic valve disorders     S/P AVR 06/2012  . Hyperlipidemia   . Heart murmur   . Hypertension   . Hypothyroidism   . Depression   . Shortness of breath   . Asthma     AS CHILD   . Arthritis     Past Surgical History  Procedure Laterality Date  . Tubal ligation    . Breast surgery      LT BREAST BX BENIGN  . Aortic valve replacement N/A 06/25/2012    Procedure: AORTIC VALVE REPLACEMENT (AVR);  Surgeon: Grace Isaac, MD;  Location: [place];  Service: Open Heart Surgery;  Laterality: N/A;  . Coronary artery bypass graft N/A 06/25/2012    Procedure: Coronary artery bypass graft  times two using left internal mammary artery and right leg greater saphenous vein;  Surgeon: Grace Isaac, MD;  Location: [place];  Service: Open Heart Surgery;  Laterality: N/A;  . Intraoperative transesophageal echocardiogram N/A 06/25/2012    Procedure: INTRAOPERATIVE TRANSESOPHAGEAL ECHOCARDIOGRAM;  Surgeon: Grace Isaac, MD;  Location: [place];  Service: Open Heart Surgery;  Laterality: N/A;     Medications: Current Outpatient Prescriptions  Medication Sig Dispense Refill  . aspirin EC 81 MG tablet Take 1 tablet (81 mg total) by mouth daily.    . benazepril (LOTENSIN) 20 MG tablet Take by mouth daily. Take 1/2 tablet by mouth daily    . calcium carbonate (TUMS) 500 MG chewable tablet Chew 1 tablet by mouth daily as needed for heartburn (indigestion).    . Canagliflozin-Metformin HCl (INVOKAMET) 50-1000 MG TABS Take 1 tablet by mouth 2 (two) times daily. 60 tablet 3  . carvedilol (COREG) 12.5 MG tablet TAKE 1 TABLET (12.5 MG TOTAL) BY MOUTH 2 (TWO) TIMES DAILY. 60 tablet 9  . cholecalciferol (VITAMIN D) 1000 UNITS tablet Take 1,000 Units by mouth daily.    . Coenzyme Q10 (CO Q-10) 100 MG CAPS Take 1 capsule by mouth 2 (two) times daily.    . felodipine  (PLENDIL) 10 MG 24 hr tablet Take 1 tablet (10 mg total) by mouth daily. (Patient taking differently: Take 5 mg by mouth daily. ) 90 tablet 3  . furosemide (LASIX) 20 MG tablet Take 1 tablet (20 mg total) by mouth daily as needed for fluid or edema (or weight gain). 30 tablet 6  . Insulin Detemir (LEVEMIR) 100 UNIT/ML Pen Inject 22 Units into the skin daily before breakfast. (Patient taking differently: Inject 20-22 Units into the skin at bedtime. ) 15 mL 11  . insulin lispro (HUMALOG) 100 UNIT/ML KiwkPen Inject 0.06-0.08 mLs (6-8 Units total) into the skin 2 (two) times daily. 15 mL 3  . Insulin Pen Needle (BD PEN NEEDLE NANO U/F) 32G X 4 MM MISC Use 3 per day 100 each 3  . levothyroxine (SYNTHROID, LEVOTHROID) 75 MCG tablet TAKE 1 TABLET BY MOUTH DAILY 30 tablet 5  . levothyroxine (SYNTHROID, LEVOTHROID) 75 MCG tablet TAKE 1 TABLET BY MOUTH DAILY 30 tablet 5  . metFORMIN (GLUCOPHAGE-XR) 750 MG 24 hr tablet Take 1 tablet (750 mg total) by mouth daily with breakfast. 90 tablet 1  . Multiple Vitamins-Minerals (MULTIVITAMINS THER. W/MINERALS) TABS Take 1 tablet by mouth daily.    . rosuvastatin (CRESTOR) 20 MG tablet Take 1 tablet (20 mg total) by mouth daily. 30 tablet 3   No current facility-administered medications for this visit.    Allergies: No Known Allergies  Social History: The patient  reports that she has never smoked. She does not have any smokeless tobacco history on file. She reports that she does not drink alcohol or use illicit drugs.   Family History: The patient's family history includes Heart disease in her father.   Review of Systems: Please see the history of present illness.   Otherwise, the review of systems is positive for none.   All other systems are reviewed and negative.   Physical Exam: VS:  BP 156/80 mmHg  Pulse 80  Ht 5\' 4"  (1.626 m)  Wt 181 lb (82.101 kg)  BMI 31.05 kg/m2 .  BMI Body mass index is 31.05 kg/(m^2).  Wt Readings from Last 3 Encounters:    08/10/15 181 lb (82.101 kg)  08/04/15 181 lb 6.4 oz (82.283 kg)  06/07/15 179 lb 3.2 oz (81.285 kg)    General: Pleasant. Obese black female who is alert and in no acute distress.  HEENT: Normal. Neck: Supple, no JVD, carotid bruits, or masses noted.  Cardiac: Regular rate and rhythm. Soft outflow murmur noted. No edema.  Respiratory:  Lungs are clear to auscultation bilaterally  with normal work of breathing.  GI: Soft and nontender.  MS: No deformity or atrophy. Gait and ROM intact. Skin: Warm and dry. Color is normal.  Neuro:  Strength and sensation are intact and no gross focal deficits noted.  Psych: Alert, appropriate and with normal affect.   LABORATORY DATA:  EKG:  EKG is not ordered today.  Lab Results  Component Value Date   WBC 5.8 01/19/2015   HGB 11.2* 01/19/2015   HCT 34.7* 01/19/2015   PLT 222.0 01/19/2015   GLUCOSE 170* 08/02/2015   CHOL 134 08/02/2015   TRIG 83.0 08/02/2015   HDL 48.30 08/02/2015   LDLDIRECT 102.8 11/28/2013   LDLCALC 69 08/02/2015   ALT 13 08/02/2015   AST 20 08/02/2015   NA 137 08/02/2015   K 4.0 08/02/2015   CL 100 08/02/2015   CREATININE 1.02 08/02/2015   BUN 22 08/02/2015   CO2 29 08/02/2015   TSH 1.06 06/02/2015   INR 1.57* 06/25/2012   HGBA1C 8.0* 08/02/2015   MICROALBUR 10.1* 08/02/2015    BNP (last 3 results) No results for input(s): BNP in the last 8760 hours.  ProBNP (last 3 results)  Recent Labs  01/19/15 1239  PROBNP 121.0*     Other Studies Reviewed Today:  Echo Study Conclusions from 12/2014  - Left ventricle: The cavity size was normal. There was mild focal basal hypertrophy of the septum. Systolic function was normal. The estimated ejection fraction was in the range of 55% to 60%. Wall motion was normal; there were no regional wall motion abnormalities. There was an increased relative contribution of atrial contraction to ventricular filling. Doppler parameters are consistent with  abnormal left ventricular relaxation (grade 1 diastolic dysfunction). Doppler parameters are consistent with high ventricular filling pressure. - Aortic valve: Poorly visualized. A bioprosthesis was present and functioning normally. Peak velocity (S): 248 cm/s. Mean gradient (S): 12 mm Hg. Valve area (VTI): 0.94 cm^2. Valve area (Vmax): 0.97 cm^2. Valve area (Vmean): 0.95 cm^2. - Mitral valve: Calcified annulus. Mild diffuse thickening. There was mild regurgitation. - Left atrium: The atrium was mildly dilated. - Pulmonic valve: There was trivial regurgitation.    Myoview Impression from 06/2013 Exercise Capacity: Lexiscan with no exercise. BP Response: Normal blood pressure response. Clinical Symptoms: No significant symptoms noted. ECG Impression: There are scattered PVCs. Comparison with Prior Nuclear Study: No previous nuclear study performed  Overall Impression: Low risk stress nuclear study small fixed distal anterior artifact which could be breast attenuation or other artifact.  LV Ejection Fraction: 66%. LV Wall Motion: Normal Wall Motion  [name], MD, [facility]    Assessment/Plan: 1. CAD - s/p CABG 06-27-12 -last Myoview stable from 2015. No active chest pain.Would continue with CV risk factor modification.    2. Aortic stenosis with prior AVR - echo is ok. Did have elevated filling pressures - Now using low dose Lasix prn. She has improved clinically.   3. Post op atrial fibrillation - remains in NSR by exam today.  4. HTN - BP little higher here today but has had some symptomatic low BP. Will continue with her current regimen.   5. Obesity - needs to really work on activity and weight. Discussed at length.   Current medicines are reviewed with the patient today.  The patient does not have concerns regarding medicines other than what has been noted above.  The following changes have been made:  See above.  Labs/ tests ordered today  include:   No orders  of the defined types were placed in this encounter.     Disposition:   FU with me in 6 months.   Patient is agreeable to this plan and will call if any problems develop in the interim.   Signed: Burtis Junes, RN, ANP-C 08/10/2015 9:24 AM  [facility] [address] [place], [place]  [address] Phone: [phone] Fax: [phone]

## 2015-08-10 NOTE — Patient Instructions (Addendum)
We will be checking the following labs today - NONE   Medication Instructions:    Continue with your current medicines.     Testing/Procedures To Be Arranged:  N/A  Follow-Up:   See me in 6 months.    Other Special Instructions:   N/A    If you need a refill on your cardiac medications before your next appointment, please call your pharmacy.   Call the [facility] office at [phone] if you have any questions, problems or concerns.      

## 2015-08-11 ENCOUNTER — Encounter: Payer: Medicare Other | Attending: Endocrinology | Admitting: Nutrition

## 2015-08-11 DIAGNOSIS — E1165 Type 2 diabetes mellitus with hyperglycemia: Secondary | ICD-10-CM

## 2015-08-11 DIAGNOSIS — Z794 Long term (current) use of insulin: Secondary | ICD-10-CM | POA: Diagnosis not present

## 2015-08-11 NOTE — Patient Instructions (Signed)
Fill and apply a new V-go every morning with Humalog insulin Give 2 clicks before breakfast and supper, and 3 clicks before lunch. Stop the Levemir insulin.

## 2015-08-11 NOTE — Progress Notes (Signed)
[name] is here with her sister to learn about the V-Go.  She was instructed on how to fill, apply, and use it.  She took her levemir this AM so will not be attaching the V-Go until tomorrow morning.   Written instructions were given to stop the levemir tomorrow morning, and attach the V-Go.  She reported good understanding of this.   Written instructions were give for meal time coverage: 2 clicks before breakfast and supper, and 3 clicks before lunch.  She re verbalized that each click delivers 2u of insulin We also discussed the need to increase/decrease this dose if eating more carbs or/active by 1 click.  She reported good understanding of this. She was given a starter kit with 6 V-go 20s and a toll free telephone number if questions, and written instructions for how to fill, apply and use the v-go. She had no final questions.

## 2015-08-16 ENCOUNTER — Ambulatory Visit: Payer: Medicare Other | Admitting: Endocrinology

## 2015-08-16 DIAGNOSIS — Z0289 Encounter for other administrative examinations: Secondary | ICD-10-CM

## 2015-08-17 ENCOUNTER — Other Ambulatory Visit: Payer: Self-pay | Admitting: Endocrinology

## 2015-08-19 ENCOUNTER — Other Ambulatory Visit: Payer: Self-pay | Admitting: *Deleted

## 2015-08-19 ENCOUNTER — Telehealth: Payer: Self-pay | Admitting: Endocrinology

## 2015-08-19 MED ORDER — INSULIN DETEMIR 100 UNIT/ML FLEXPEN: PEN_INJECTOR | SUBCUTANEOUS | Status: DC

## 2015-08-19 NOTE — Telephone Encounter (Signed)
Patient ask you to call her, she said it was very important.

## 2015-08-19 NOTE — Telephone Encounter (Signed)
Patient was requesting her Levemir be sent to a new pharmacy.

## 2015-08-30 ENCOUNTER — Other Ambulatory Visit: Payer: Self-pay | Admitting: Endocrinology

## 2015-09-01 ENCOUNTER — Other Ambulatory Visit: Payer: Self-pay | Admitting: Endocrinology

## 2015-09-15 ENCOUNTER — Ambulatory Visit (INDEPENDENT_AMBULATORY_CARE_PROVIDER_SITE_OTHER): Payer: Medicare Other | Admitting: Endocrinology

## 2015-09-15 ENCOUNTER — Encounter: Payer: Self-pay | Admitting: Endocrinology

## 2015-09-15 VITALS — BP 118/72 | HR 64 | Temp 98.0°F | Resp 16 | Ht 64.0 in | Wt 178.2 lb

## 2015-09-15 DIAGNOSIS — Z794 Long term (current) use of insulin: Secondary | ICD-10-CM | POA: Diagnosis not present

## 2015-09-15 DIAGNOSIS — E1165 Type 2 diabetes mellitus with hyperglycemia: Secondary | ICD-10-CM

## 2015-09-15 NOTE — Patient Instructions (Signed)
Take 12 units Levemir in am and 10 in pm

## 2015-09-15 NOTE — Progress Notes (Signed)
[name] 82 y.o.            Reason for Appointment: Diabetes follow-up   History of Present Illness   Diagnosis: Type 2 DIABETES MELITUS, date of diagnosis:   2000   She has been on insulin since 2004 with difficulty controlling adequately because of compliance with diet and variability in blood sugars. Her previous records are not available at present, however she has been on basal insulin with mealtime coverage since at least 2007 She has been tried on Byetta previously but did not tolerate this well She tried Victoza but didn't not benefit from Victoza 0.6 dosage; had nausea from 1.2 mg; did not continue this because of this side effect  RECENT history:    INSULIN: Levemir 10 bid, HUMALOG 6-8 acb, 6-8  lunch and 0-4 at supper    Overall she has had somewhat improved control with continuing low dose Invokamet which she is tolerating Also on metformin ER separately which she takes in the evening. Her last A1c is higher at 8% in 4/17  On her previous visit she was changed from her basal bolus to the V-go pump but she did not like where she was wearing it and having difficulty with bolusing through her clothing and decided not to use it.  She does not know if her sugars were better  Also has been taking Levemir twice a day instead of bedtime  She has the following blood sugar patterns and problems identified  She is taking her Humalog before her lunch meal only about 4 days a week and will still take it mostly after lunch when she is going out to eat   Again her blood sugars may go up to over 400 after lunch if she does not take her insulin pre-meal  FASTING blood sugars are mildly increased overall even with twice a day Levemir  POSTPRANDIAL blood sugars after breakfast are quite variable.  However sometimes she has a snack late morning which may be causing her high readings at times.  Her breakfast is usually consistent  Frequently eating out at lunch at a  restaurant  Generally having very little food at suppertime and blood sugars are usually not higher in the evenings  Oral hypoglycemic drugs: Metformin 750 mg at supper, Invokamet 50/1000 at 9 am     .         Monitors blood glucose:  about 2-3 times daily     Glucometer: One Touch.          Blood Glucose readings from download:  Mean values apply above for all meters except median for One Touch  PRE-MEAL Fasting Lunch Dinner Bedtime Overall  Glucose range: 126-190 115-379 57-298 69-197   Mean/median: 149   154 178+/-78   POST-MEAL PC Breakfast PC Lunch PC Dinner  Glucose range:  122-423   Mean/median:  230     Meals: 3 meals per day:  breakfast is mostly peanut butter or cheese crackers;  lunch 12-2 pm, dinner 5 pm Physical activity: exercise: walks upto 30 min; 1-3/7   days  Wt Readings from Last 3 Encounters:  09/15/15 178 lb 3.2 oz (80.831 kg)  08/10/15 181 lb (82.101 kg)  08/04/15 181 lb 6.4 oz (82.283 kg)    Lab Results  Component Value Date   HGBA1C 8.0* 08/02/2015   HGBA1C 7.6* 04/02/2015   HGBA1C 7.4* 11/24/2014   Lab Results  Component Value Date   MICROALBUR 10.1* 08/02/2015   LDLCALC 69 08/02/2015  CREATININE 1.02 08/02/2015    No visits with results within 1 Week(s) from this visit. Latest known visit with results is:  Lab on 08/02/2015  Component Date Value Ref Range Status  . Hgb A1c MFr Bld 08/02/2015 8.0* 4.6 - 6.5 % Final   Glycemic Control Guidelines for People with Diabetes:Non Diabetic:  <6%Goal of Therapy: <7%Additional Action Suggested:  >8%   . Sodium 08/02/2015 137  135 - 145 mEq/L Final  . Potassium 08/02/2015 4.0  3.5 - 5.1 mEq/L Final  . Chloride 08/02/2015 100  96 - 112 mEq/L Final  . CO2 08/02/2015 29  19 - 32 mEq/L Final  . Glucose, Bld 08/02/2015 170* 70 - 99 mg/dL Final  . BUN 08/02/2015 22  6 - 23 mg/dL Final  . Creatinine, Ser 08/02/2015 1.02  0.40 - 1.20 mg/dL Final  . Total Bilirubin 08/02/2015 0.5  0.2 - 1.2 mg/dL Final   . Alkaline Phosphatase 08/02/2015 37* 39 - 117 U/L Final  . AST 08/02/2015 20  0 - 37 U/L Final  . ALT 08/02/2015 13  0 - 35 U/L Final  . Total Protein 08/02/2015 7.7  6.0 - 8.3 g/dL Final  . Albumin 08/02/2015 4.2  3.5 - 5.2 g/dL Final  . Calcium 08/02/2015 9.8  8.4 - 10.5 mg/dL Final  . GFR 08/02/2015 67.00  >60.00 mL/min Final  . Microalb, Ur 08/02/2015 10.1* 0.0 - 1.9 mg/dL Final  . Creatinine,U 08/02/2015 30.7   Final  . Microalb Creat Ratio 08/02/2015 33.0* 0.0 - 30.0 mg/g Final  . Cholesterol 08/02/2015 134  0 - 200 mg/dL Final   ATP III Classification       Desirable:  < 200 mg/dL               Borderline High:  200 - 239 mg/dL          High:  > = 240 mg/dL  . Triglycerides 08/02/2015 83.0  0.0 - 149.0 mg/dL Final   Normal:  <150 mg/dLBorderline High:  150 - 199 mg/dL  . HDL 08/02/2015 48.30  >39.00 mg/dL Final  . VLDL 08/02/2015 16.6  0.0 - 40.0 mg/dL Final  . LDL Cholesterol 08/02/2015 69  0 - 99 mg/dL Final  . Total CHOL/HDL Ratio 08/02/2015 3   Final                  Men          Women1/2 Average Risk     3.4          3.3Average Risk          5.0          4.42X Average Risk          9.6          7.13X Average Risk          15.0          11.0                      . NonHDL 08/02/2015 85.57   Final   NOTE:  Non-HDL goal should be 30 mg/dL higher than patient's LDL goal (i.e. LDL goal of < 70 mg/dL, would have non-HDL goal of < 100 mg/dL)       Medication List       This list is accurate as of: 09/15/15  4:14 PM.  Always use your most recent med list.  aspirin EC 81 MG tablet  Take 1 tablet (81 mg total) by mouth daily.     benazepril 20 MG tablet  Commonly known as:  LOTENSIN  Take by mouth daily. Take 1/2 tablet by mouth daily     Canagliflozin-Metformin HCl 50-1000 MG Tabs  Commonly known as:  INVOKAMET  Take 1 tablet by mouth 2 (two) times daily.     carvedilol 12.5 MG tablet  Commonly known as:  COREG  TAKE 1 TABLET (12.5 MG TOTAL) BY MOUTH 2  (TWO) TIMES DAILY.     cholecalciferol 1000 units tablet  Commonly known as:  VITAMIN D  Take 1,000 Units by mouth daily.     Co Q-10 100 MG Caps  Take 1 capsule by mouth 2 (two) times daily.     felodipine 10 MG 24 hr tablet  Commonly known as:  PLENDIL  Take 1 tablet (10 mg total) by mouth daily.     furosemide 20 MG tablet  Commonly known as:  LASIX  Take 1 tablet (20 mg total) by mouth daily as needed for fluid or edema (or weight gain).     Insulin Detemir 100 UNIT/ML Pen  Commonly known as:  LEVEMIR FLEXTOUCH  INJECT 22 UNITS INTO THE SKIN DAILY BEFORE BREAKFAST.     insulin lispro 100 UNIT/ML KiwkPen  Commonly known as:  HUMALOG  Inject 0.06-0.08 mLs (6-8 Units total) into the skin 2 (two) times daily.     Insulin Pen Needle 32G X 4 MM Misc  Commonly known as:  BD PEN NEEDLE NANO U/F  Use 3 per day     levothyroxine 75 MCG tablet  Commonly known as:  SYNTHROID, LEVOTHROID  TAKE 1 TABLET BY MOUTH DAILY     metFORMIN 750 MG 24 hr tablet  Commonly known as:  GLUCOPHAGE-XR  Take 1 tablet (750 mg total) by mouth daily with breakfast.     multivitamins ther. w/minerals Tabs tablet  Take 1 tablet by mouth daily.     rosuvastatin 20 MG tablet  Commonly known as:  CRESTOR  Take 1 tablet (20 mg total) by mouth daily.     TUMS 500 MG chewable tablet  Generic drug:  calcium carbonate  Chew 1 tablet by mouth daily as needed for heartburn (indigestion).        Allergies: No Known Allergies  Past Medical History  Diagnosis Date  . Thyroid disease   . Diabetes mellitus   . Lumbago   . Aortic valve disorders     S/P AVR 06/2012  . Hyperlipidemia   . Heart murmur   . Hypertension   . Hypothyroidism   . Depression   . Shortness of breath   . Asthma     AS CHILD   . Arthritis     Past Surgical History  Procedure Laterality Date  . Tubal ligation    . Breast surgery      LT BREAST BX BENIGN  . Aortic valve replacement N/A 06/25/2012    Procedure: AORTIC  VALVE REPLACEMENT (AVR);  Surgeon: Grace Isaac, MD;  Location: [place];  Service: Open Heart Surgery;  Laterality: N/A;  . Coronary artery bypass graft N/A 06/25/2012    Procedure: Coronary artery bypass graft  times two using left internal mammary artery and right leg greater saphenous vein;  Surgeon: Grace Isaac, MD;  Location: [place];  Service: Open Heart Surgery;  Laterality: N/A;  . Intraoperative transesophageal echocardiogram N/A 06/25/2012    Procedure: INTRAOPERATIVE  TRANSESOPHAGEAL ECHOCARDIOGRAM;  Surgeon: Grace Isaac, MD;  Location: [place];  Service: Open Heart Surgery;  Laterality: N/A;    Family History  Problem Relation Age of Onset  . Heart disease Father     Social History:  reports that she has never smoked. She does not have any smokeless tobacco history on file. She reports that she does not drink alcohol or use illicit drugs.  Review of Systems:  HYPERTENSION: taking multiple drugs , herfelodipine was reduced to half tablet on the last visit because of relatively low blood pressure. No lightheaded symptoms now   HYPOTHYROIDISM: She has had long-standing hypothyroidism  She has had consistent TSH level with reducing her dose to 75 g  Lab Results  Component Value Date   TSH 1.06 06/02/2015   TSH 1.02 04/02/2015   TSH 2.05 11/24/2014   FREET4 1.13 06/02/2015   FREET4 1.07 03/11/2014   FREET4 1.01 09/25/2013    HYPERLIPIDEMIA: The lipid abnormality consists of elevated LDL; has been  controlled with Crestor 20 mg Being managed by PCP.   LDL  below 100    Lab Results  Component Value Date   CHOL 134 08/02/2015   HDL 48.30 08/02/2015   LDLCALC 69 08/02/2015   LDLDIRECT 102.8 11/28/2013   TRIG 83.0 08/02/2015   CHOLHDL 3 08/02/2015  '    She does see a podiatrist regularly for toenails trimming  Last foot exam was in 12/16: Diabetic foot exam shows normal monofilament sensation in the toes and plantar surfaces, no skin lesions or ulcers on  the feet and normal pedal pulses     Examination:   BP 118/72 mmHg  Pulse 64  Temp(Src) 98 F (36.7 C)  Resp 16  Ht 5\' 4"  (1.626 m)  Wt 178 lb 3.2 oz (80.831 kg)  BMI 30.57 kg/m2  SpO2 95%  Body mass index is 30.57 kg/(m^2).       ASSESSMENT/ PLAN:   Diabetes type 2, uncontrolled   See history of present illness for discussion on her recent management and problems identified She still has difficulty with compliance with her mealtime insulin Although her blood sugars probably did better with the V-go pump she refuses to consider this now because of not liking the practical aspects of it Also difficult to get a proper basal insulin regimen since her insurance covers only Levemir  Recommendations:  She will try to be much better consistent with her mealtime insulin at lunch  Also avoid taking excessive postprandial correction doses in the afternoon  May need to take 2-3 units for midmorning snack  Increase morning Levemir by 2 units  Discussed blood sugar targets at various times   HYPOTHYROID: Needs follow-up TSH on the next visit  HYPERTENSION: Well controlled   Counseling time on subjects discussed above is over 50% of today's 25 minute visit   Ayyub Krall 09/15/2015, 4:14 PM   Note: This office note was prepared with Dragon voice recognition system technology. Any transcriptional errors that result from this process are unintentional.

## 2015-09-20 ENCOUNTER — Telehealth: Payer: Self-pay | Admitting: Endocrinology

## 2015-09-20 ENCOUNTER — Other Ambulatory Visit: Payer: Self-pay | Admitting: *Deleted

## 2015-09-20 MED ORDER — METFORMIN HCL ER 750 MG PO TB24: ORAL_TABLET | ORAL | Status: DC

## 2015-09-20 MED ORDER — CANAGLIFLOZIN-METFORMIN HCL 50-1000 MG PO TABS: 1.0000 | ORAL_TABLET | Freq: Two times a day (BID) | ORAL | Status: DC

## 2015-09-20 NOTE — Telephone Encounter (Signed)
Patient ask you to call her [phone]

## 2015-09-20 NOTE — Telephone Encounter (Signed)
Patient needed her Invokamet and Metformin called in to [facility], rx has been sent, patient is aware.

## 2015-09-29 ENCOUNTER — Other Ambulatory Visit: Payer: Self-pay | Admitting: Endocrinology

## 2015-10-04 DIAGNOSIS — I10 Essential (primary) hypertension: Secondary | ICD-10-CM | POA: Diagnosis not present

## 2015-10-04 DIAGNOSIS — E119 Type 2 diabetes mellitus without complications: Secondary | ICD-10-CM | POA: Diagnosis not present

## 2015-10-04 DIAGNOSIS — E039 Hypothyroidism, unspecified: Secondary | ICD-10-CM | POA: Diagnosis not present

## 2015-10-04 DIAGNOSIS — M25562 Pain in left knee: Secondary | ICD-10-CM | POA: Diagnosis not present

## 2015-10-13 ENCOUNTER — Other Ambulatory Visit: Payer: Self-pay | Admitting: Endocrinology

## 2015-10-22 ENCOUNTER — Other Ambulatory Visit (INDEPENDENT_AMBULATORY_CARE_PROVIDER_SITE_OTHER): Payer: Medicare Other

## 2015-10-22 DIAGNOSIS — E1165 Type 2 diabetes mellitus with hyperglycemia: Secondary | ICD-10-CM

## 2015-10-22 DIAGNOSIS — Z794 Long term (current) use of insulin: Secondary | ICD-10-CM | POA: Diagnosis not present

## 2015-10-22 LAB — TSH: TSH: 1.33 u[IU]/mL (ref 0.35–4.50)

## 2015-10-22 LAB — COMPREHENSIVE METABOLIC PANEL
ALBUMIN: 4 g/dL (ref 3.5–5.2)
ALT: 8 U/L (ref 0–35)
AST: 14 U/L (ref 0–37)
Alkaline Phosphatase: 37 U/L — ABNORMAL LOW (ref 39–117)
BUN: 19 mg/dL (ref 6–23)
CHLORIDE: 100 meq/L (ref 96–112)
CO2: 30 mEq/L (ref 19–32)
CREATININE: 0.86 mg/dL (ref 0.40–1.20)
Calcium: 9.5 mg/dL (ref 8.4–10.5)
GFR: 81.53 mL/min (ref >60.00)
GLUCOSE: 223 mg/dL — AB (ref 70–99)
POTASSIUM: 4 meq/L (ref 3.5–5.1)
SODIUM: 136 meq/L (ref 135–145)
Total Bilirubin: 0.4 mg/dL (ref 0.2–1.2)
Total Protein: 7.5 g/dL (ref 6.0–8.3)

## 2015-10-22 LAB — HEMOGLOBIN A1C: HEMOGLOBIN A1C: 7.9 % — AB (ref 4.6–6.5)

## 2015-10-27 ENCOUNTER — Encounter: Payer: Self-pay | Admitting: Podiatry

## 2015-10-27 ENCOUNTER — Ambulatory Visit (INDEPENDENT_AMBULATORY_CARE_PROVIDER_SITE_OTHER): Payer: Medicare Other | Admitting: Podiatry

## 2015-10-27 ENCOUNTER — Encounter: Payer: Self-pay | Admitting: Endocrinology

## 2015-10-27 ENCOUNTER — Ambulatory Visit (INDEPENDENT_AMBULATORY_CARE_PROVIDER_SITE_OTHER): Payer: Medicare Other | Admitting: Endocrinology

## 2015-10-27 VITALS — BP 120/67 | HR 57 | Ht 64.0 in | Wt 182.0 lb

## 2015-10-27 DIAGNOSIS — E063 Autoimmune thyroiditis: Secondary | ICD-10-CM

## 2015-10-27 DIAGNOSIS — Z794 Long term (current) use of insulin: Secondary | ICD-10-CM

## 2015-10-27 DIAGNOSIS — E1165 Type 2 diabetes mellitus with hyperglycemia: Secondary | ICD-10-CM

## 2015-10-27 DIAGNOSIS — N3281 Overactive bladder: Secondary | ICD-10-CM | POA: Diagnosis not present

## 2015-10-27 DIAGNOSIS — L84 Corns and callosities: Secondary | ICD-10-CM

## 2015-10-27 DIAGNOSIS — I10 Essential (primary) hypertension: Secondary | ICD-10-CM

## 2015-10-27 DIAGNOSIS — M79676 Pain in unspecified toe(s): Secondary | ICD-10-CM

## 2015-10-27 DIAGNOSIS — E038 Other specified hypothyroidism: Secondary | ICD-10-CM | POA: Diagnosis not present

## 2015-10-27 DIAGNOSIS — E119 Type 2 diabetes mellitus without complications: Secondary | ICD-10-CM | POA: Diagnosis not present

## 2015-10-27 DIAGNOSIS — B351 Tinea unguium: Secondary | ICD-10-CM

## 2015-10-27 MED ORDER — SOLIFENACIN SUCCINATE 5 MG PO TABS: 5.0000 mg | ORAL_TABLET | Freq: Every day | ORAL | Status: DC

## 2015-10-27 NOTE — Patient Instructions (Signed)
Levemir 14 in am and 10 in pm  Restart INvokamet in am  Take 8-10 Humalog at Bfst and lunch

## 2015-10-27 NOTE — Progress Notes (Signed)
[name] 82 y.o.            Reason for Appointment: Diabetes follow-up   History of Present Illness   Diagnosis: Type 2 DIABETES MELITUS, date of diagnosis:   2000   She has been on insulin since 2004 with difficulty controlling adequately because of compliance with diet and variability in blood sugars. Her previous records are not available at present, however she has been on basal insulin with mealtime coverage since at least 2007 She has been tried on Byetta previously but did not tolerate this well She tried Victoza but didn't not benefit from Victoza 0.6 dosage; had nausea from 1.2 mg; did not continue this because of this side effect She also was given a trial of the V-go pump but she subjectively did not like this  RECENT history:    INSULIN: Levemir 11 units bid, HUMALOG 6-8 acb, 6-8  lunch and 0-4 at supper    Overall she has had somewhat improved control with continuing low dose Invokamet Also on metformin ER separately which she takes in the evening.  However A1c is usually around 8%  About 2-3 weeks ago because of news media publicity she stopped taking her Invokana  She has the following blood sugar patterns and problems identified  She is having higher blood sugars overall especially postprandially with stopping Invokana  Appears to have significantly high readings after breakfast and also some after lunch despite not eating a lot of carbohydrate  FASTING blood sugars are somewhat high at times, this morning because of not taking her Levemir the night before  Glucose after breakfast is usually higher even though she may take a higher dose of 8 units for certain meals  Frequently eating out at lunch at a restaurant but is trying to take her Humalog with her most of the time  Generally having very little food at suppertime and blood sugars are usually not higher after eating  She had one episode of hypoglycemia when she was trying to do a correction for a  glucose of 392 at suppertime with taking 4 units of insulin, extra metformin and no significant mood intake  Oral hypoglycemic drugs: Metformin 750 mg at supper, Invokamet 50/1000 at 9 am     .         Monitors blood glucose:  about 2-3 times daily     Glucometer: One Touch.          Blood Glucose readings from download:  Mean values apply above for all meters except median for One Touch  PRE-MEAL Fasting Lunch Dinner Bedtime Overall  Glucose range: 94-258  161-368  78-392  37-54    Mean/median:  281   178   POST-MEAL PC Breakfast PC Lunch PC Dinner  Glucose range:     Mean/median:  193     Meals: 3 meals per day:  breakfast is mostly peanut butter or cheese crackers;  lunch 12-2 pm, dinner 5 pm Physical activity: exercise: walks upto 30 min; 1-3/7   days  Wt Readings from Last 3 Encounters:  10/27/15 182 lb (82.555 kg)  09/15/15 178 lb 3.2 oz (80.831 kg)  08/10/15 181 lb (82.101 kg)    Lab Results  Component Value Date   HGBA1C 7.9* 10/22/2015   HGBA1C 8.0* 08/02/2015   HGBA1C 7.6* 04/02/2015   Lab Results  Component Value Date   MICROALBUR 10.1* 08/02/2015   LDLCALC 69 08/02/2015   CREATININE 0.86 10/22/2015    Lab on 10/22/2015  Component Date Value Ref Range Status  . Hgb A1c MFr Bld 10/22/2015 7.9* 4.6 - 6.5 % Final   Glycemic Control Guidelines for People with Diabetes:Non Diabetic:  <6%Goal of Therapy: <7%Additional Action Suggested:  >8%   . Sodium 10/22/2015 136  135 - 145 mEq/L Final  . Potassium 10/22/2015 4.0  3.5 - 5.1 mEq/L Final  . Chloride 10/22/2015 100  96 - 112 mEq/L Final  . CO2 10/22/2015 30  19 - 32 mEq/L Final  . Glucose, Bld 10/22/2015 223* 70 - 99 mg/dL Final  . BUN 10/22/2015 19  6 - 23 mg/dL Final  . Creatinine, Ser 10/22/2015 0.86  0.40 - 1.20 mg/dL Final  . Total Bilirubin 10/22/2015 0.4  0.2 - 1.2 mg/dL Final  . Alkaline Phosphatase 10/22/2015 37* 39 - 117 U/L Final  . AST 10/22/2015 14  0 - 37 U/L Final  . ALT 10/22/2015 8  0 - 35  U/L Final  . Total Protein 10/22/2015 7.5  6.0 - 8.3 g/dL Final  . Albumin 10/22/2015 4.0  3.5 - 5.2 g/dL Final  . Calcium 10/22/2015 9.5  8.4 - 10.5 mg/dL Final  . GFR 10/22/2015 81.53  >60.00 mL/min Final  . TSH 10/22/2015 1.33  0.35 - 4.50 uIU/mL Final       Medication List       This list is accurate as of: 10/27/15 10:13 AM.  Always use your most recent med list.               aspirin EC 81 MG tablet  Take 1 tablet (81 mg total) by mouth daily.     B-D UF III MINI PEN NEEDLES 31G X 5 MM Misc  Generic drug:  Insulin Pen Needle  USE 3 PER DAY     benazepril 20 MG tablet  Commonly known as:  LOTENSIN  Take by mouth daily. Take 1/2 tablet by mouth daily     Canagliflozin-Metformin HCl 50-1000 MG Tabs  Commonly known as:  INVOKAMET  Take 1 tablet by mouth 2 (two) times daily.     carvedilol 12.5 MG tablet  Commonly known as:  COREG  TAKE 1 TABLET (12.5 MG TOTAL) BY MOUTH 2 (TWO) TIMES DAILY.     cholecalciferol 1000 units tablet  Commonly known as:  VITAMIN D  Take 1,000 Units by mouth daily.     Co Q-10 100 MG Caps  Take 1 capsule by mouth 2 (two) times daily.     felodipine 10 MG 24 hr tablet  Commonly known as:  PLENDIL  Take 1 tablet (10 mg total) by mouth daily.     furosemide 20 MG tablet  Commonly known as:  LASIX  Take 1 tablet (20 mg total) by mouth daily as needed for fluid or edema (or weight gain).     Insulin Detemir 100 UNIT/ML Pen  Commonly known as:  LEVEMIR FLEXTOUCH  INJECT 22 UNITS INTO THE SKIN DAILY BEFORE BREAKFAST.     insulin lispro 100 UNIT/ML KiwkPen  Commonly known as:  HUMALOG  Inject 0.06-0.08 mLs (6-8 Units total) into the skin 2 (two) times daily.     levothyroxine 75 MCG tablet  Commonly known as:  SYNTHROID, LEVOTHROID  TAKE 1 TABLET BY MOUTH DAILY     metFORMIN 750 MG 24 hr tablet  Commonly known as:  GLUCOPHAGE-XR  Take 2 tablets daily     multivitamins ther. w/minerals Tabs tablet  Take 1 tablet by mouth daily.  rosuvastatin 20 MG tablet  Commonly known as:  CRESTOR  TAKE 1 TABLET (20 MG TOTAL) BY MOUTH DAILY.     solifenacin 5 MG tablet  Commonly known as:  VESICARE  Take 1 tablet (5 mg total) by mouth daily.     TUMS 500 MG chewable tablet  Generic drug:  calcium carbonate  Chew 1 tablet by mouth daily as needed for heartburn (indigestion).        Allergies: No Known Allergies  Past Medical History  Diagnosis Date  . Thyroid disease   . Diabetes mellitus   . Lumbago   . Aortic valve disorders     S/P AVR 06/2012  . Hyperlipidemia   . Heart murmur   . Hypertension   . Hypothyroidism   . Depression   . Shortness of breath   . Asthma     AS CHILD   . Arthritis     Past Surgical History  Procedure Laterality Date  . Tubal ligation    . Breast surgery      LT BREAST BX BENIGN  . Aortic valve replacement N/A 06/25/2012    Procedure: AORTIC VALVE REPLACEMENT (AVR);  Surgeon: Grace Isaac, MD;  Location: [facility];  Service: Open Heart Surgery;  Laterality: N/A;  . Coronary artery bypass graft N/A 06/25/2012    Procedure: Coronary artery bypass graft  times two using left internal mammary artery and right leg greater saphenous vein;  Surgeon: Grace Isaac, MD;  Location: [facility];  Service: Open Heart Surgery;  Laterality: N/A;  . Intraoperative transesophageal echocardiogram N/A 06/25/2012    Procedure: INTRAOPERATIVE TRANSESOPHAGEAL ECHOCARDIOGRAM;  Surgeon: Grace Isaac, MD;  Location: [facility];  Service: Open Heart Surgery;  Laterality: N/A;    Family History  Problem Relation Age of Onset  . Heart disease Father     Social History:  reports that she has never smoked. She does not have any smokeless tobacco history on file. She reports that she does not drink alcohol or use illicit drugs.  Review of Systems:  Today she is asking about frequent urination, some incontinence and nocturia several times.  Has not discussed with PCP  HYPERTENSION: taking multiple  drugs.  Blood pressure is well-controlled   HYPOTHYROIDISM: She has had long-standing hypothyroidism  She has had consistent TSH level with current 75 g dose   Lab Results  Component Value Date   TSH 1.33 10/22/2015   TSH 1.06 06/02/2015   TSH 1.02 04/02/2015   FREET4 1.13 06/02/2015   FREET4 1.07 03/11/2014   FREET4 1.01 09/25/2013    HYPERLIPIDEMIA: The lipid abnormality consists of elevated LDL; has been  controlled with Crestor 20 mg Being managed by PCP.   LDL  below 100    Lab Results  Component Value Date   CHOL 134 08/02/2015   HDL 48.30 08/02/2015   LDLCALC 69 08/02/2015   LDLDIRECT 102.8 11/28/2013   TRIG 83.0 08/02/2015   CHOLHDL 3 08/02/2015  '    She does see a podiatrist regularly for toenails trimming  Last foot exam was in 12/16: Diabetic foot exam shows normal monofilament sensation in the toes and plantar surfaces, no skin lesions or ulcers on the feet and normal pedal pulses    Examination:   BP 120/67 mmHg  Pulse 57  Ht 5\' 4"  (1.626 m)  Wt 182 lb (82.555 kg)  BMI 31.22 kg/m2  SpO2 99%  Body mass index is 31.22 kg/(m^2).   No ankle edema present  ASSESSMENT/ PLAN:   Diabetes type 2, uncontrolled   See history of present illness for a discussion of the blood sugar patterns, recent management and problems identified  Her A1c is still around 8% She has difficulty with postprandial hyperglycemia at breakfast and lunch and appears to be needing relatively more insulin to cover her meals even with relatively small portions Also has high readings with her stopping Invokana on her own Discussed potential side effects with Invokana and advised her that it is not a harmful  drug, has had no change in renal function Since her sugars are generally higher in the afternoon also will probably need more Levemir in the morning Would prefer to have her switch to Toujeo which is covered by her insurance but she wants to wait until her supply is  finished  Overactive bladder: She was advised to discuss treatment with PCP but she wants medication now, will try Vesicare which appears to be preferred on her insurance  Recommendations:  She will need to take 2-3 units more for breakfast and lunch on a routine basis  Restart Invokamet  Consider adding Trulicity or Bydureon  Also avoid taking excessive postprandial correction doses at dinnertime  May need to take 2-3 units for midmorning snack  Increase morning Levemir by 3 units, take 14 in the morning and 10 in the evening  Discussed blood sugar targets at various times   HYPERTENSION: Well controlled   Patient Instructions  Levemir 14 in am and 10 in pm  Restart INvokamet in am  Take 8-10 Humalog at Bfst and lunch         Counseling time on subjects discussed above is over 50% of today's 25 minute visit   [name] 10/27/2015, 10:13 AM   Note: This office note was prepared with Dragon voice recognition system technology. Any transcriptional errors that result from this process are unintentional.

## 2015-10-27 NOTE — Patient Instructions (Signed)
Diabetes and Foot Care Diabetes may cause you to have problems because of poor blood supply (circulation) to your feet and legs. This may cause the skin on your feet to become thinner, break easier, and heal more slowly. Your skin may become dry, and the skin may peel and crack. You may also have nerve damage in your legs and feet causing decreased feeling in them. You may not notice minor injuries to your feet that could lead to infections or more serious problems. Taking care of your feet is one of the most important things you can do for yourself.  HOME CARE INSTRUCTIONS  Wear shoes at all times, even in the house. Do not go barefoot. Bare feet are easily injured.  Check your feet daily for blisters, cuts, and redness. If you cannot see the bottom of your feet, use a mirror or ask someone for help.  Wash your feet with warm water (do not use hot water) and mild soap. Then pat your feet and the areas between your toes until they are completely dry. Do not soak your feet as this can dry your skin.  Apply a moisturizing lotion or petroleum jelly (that does not contain alcohol and is unscented) to the skin on your feet and to dry, brittle toenails. Do not apply lotion between your toes.  Trim your toenails straight across. Do not dig under them or around the cuticle. File the edges of your nails with an emery board or nail file.  Do not cut corns or calluses or try to remove them with medicine.  Wear clean socks or stockings every day. Make sure they are not too tight. Do not wear knee-high stockings since they may decrease blood flow to your legs.  Wear shoes that fit properly and have enough cushioning. To break in new shoes, wear them for just a few hours a day. This prevents you from injuring your feet. Always look in your shoes before you put them on to be sure there are no objects inside.  Do not cross your legs. This may decrease the blood flow to your feet.  If you find a minor scrape,  cut, or break in the skin on your feet, keep it and the skin around it clean and dry. These areas may be cleansed with mild soap and water. Do not cleanse the area with peroxide, alcohol, or iodine.  When you remove an adhesive bandage, be sure not to damage the skin around it.  If you have a wound, look at it several times a day to make sure it is healing.  Do not use heating pads or hot water bottles. They may burn your skin. If you have lost feeling in your feet or legs, you may not know it is happening until it is too late.  Make sure your health care provider performs a complete foot exam at least annually or more often if you have foot problems. Report any cuts, sores, or bruises to your health care provider immediately. SEEK MEDICAL CARE IF:   You have an injury that is not healing.  You have cuts or breaks in the skin.  You have an ingrown nail.  You notice redness on your legs or feet.  You feel burning or tingling in your legs or feet.  You have pain or cramps in your legs and feet.  Your legs or feet are numb.  Your feet always feel cold. SEEK IMMEDIATE MEDICAL CARE IF:   There is increasing redness,   swelling, or pain in or around a wound.  There is a red line that goes up your leg.  Pus is coming from a wound.  You develop a fever or as directed by your health care provider.  You notice a bad smell coming from an ulcer or wound.   This information is not intended to replace advice given to you by your health care provider. Make sure you discuss any questions you have with your health care provider.   Document Released: 04/14/2000 Document Revised: 12/18/2012 Document Reviewed: 09/24/2012 Elsevier Interactive Patient Education 2016 [employer]  

## 2015-10-28 NOTE — Progress Notes (Signed)
Patient ID: [name], female   DOB: 01-08-1933, 82 y.o.   MRN: [id number]  Subjective: This patient presents for scheduled visit complaining of painful toenails when walking wearing shoes and requests nail debridement  Objective: No open skin lesions bilaterally Hammertoe second bilaterally Minimal plantar keratoses right The toenails are elongated, brittle, discolored, incurvated and tender to direct palpation 6-10 Corn second and fifth right toes  Assessment: Diabetic type II with a history of satisfactory neurovascular status Symptomatic onychomycoses 6-10 keratoses 3  Plan: Debrided toenails 10 and mechanically and electrically without any bleeding Debride keratoses 3 without any bleeding  Reappoint 3 months

## 2015-12-22 ENCOUNTER — Telehealth: Payer: Self-pay | Admitting: Endocrinology

## 2015-12-22 ENCOUNTER — Other Ambulatory Visit (INDEPENDENT_AMBULATORY_CARE_PROVIDER_SITE_OTHER): Payer: Medicare Other

## 2015-12-22 DIAGNOSIS — E1165 Type 2 diabetes mellitus with hyperglycemia: Secondary | ICD-10-CM | POA: Diagnosis not present

## 2015-12-22 DIAGNOSIS — Z794 Long term (current) use of insulin: Secondary | ICD-10-CM

## 2015-12-22 LAB — BASIC METABOLIC PANEL
BUN: 18 mg/dL (ref 6–23)
CHLORIDE: 100 meq/L (ref 96–112)
CO2: 30 meq/L (ref 19–32)
CREATININE: 0.96 mg/dL (ref 0.40–1.20)
Calcium: 9.6 mg/dL (ref 8.4–10.5)
GFR: 71.78 mL/min (ref >60.00)
Glucose, Bld: 111 mg/dL — ABNORMAL HIGH (ref 70–99)
Potassium: 3.8 mEq/L (ref 3.5–5.1)
Sodium: 137 mEq/L (ref 135–145)

## 2015-12-22 NOTE — Telephone Encounter (Signed)
[facility] needs the refills on felodopine and rosuvastatin

## 2015-12-23 ENCOUNTER — Telehealth: Payer: Self-pay | Admitting: Endocrinology

## 2015-12-23 LAB — FRUCTOSAMINE: Fructosamine: 336 umol/L — ABNORMAL HIGH (ref 0–285)

## 2015-12-23 MED ORDER — ROSUVASTATIN CALCIUM 20 MG PO TABS: ORAL_TABLET | ORAL | 3 refills | Status: DC

## 2015-12-23 NOTE — Telephone Encounter (Signed)
I contacted the pt. Pt advised per Dr. Dwyane Dee to ask pcp on the felodipine for the refill. Pt voiced understanding and will request pcp to refill the medication. Rx for Crestor has be sent pt voiced understanding.

## 2015-12-23 NOTE — Telephone Encounter (Signed)
Rx submitted for crestor per Dr. Ronnie Derby ok and pt will contact primary care doctor for Felodipine refill.

## 2015-12-23 NOTE — Telephone Encounter (Signed)
PT called in and is very upset because her Crestor and Felodipine have not been sent in yet.  She has missed taking her medication today and needs these sent into pharmacy as soon as possible. [facility] [address]

## 2015-12-23 NOTE — Telephone Encounter (Signed)
Patient need a refill of medication Felodipine and Rosuvastatin send to  [facility], [place] [phone] (Phone) [phone] (Fax)   She stated the pharmacy haven't received it yet. She is out of her medication.

## 2015-12-27 ENCOUNTER — Encounter: Payer: Self-pay | Admitting: Endocrinology

## 2015-12-27 ENCOUNTER — Ambulatory Visit (INDEPENDENT_AMBULATORY_CARE_PROVIDER_SITE_OTHER): Payer: Medicare Other | Admitting: Endocrinology

## 2015-12-27 ENCOUNTER — Other Ambulatory Visit: Payer: Self-pay

## 2015-12-27 VITALS — BP 127/67 | HR 70 | Ht 64.0 in | Wt 177.0 lb

## 2015-12-27 DIAGNOSIS — E038 Other specified hypothyroidism: Secondary | ICD-10-CM | POA: Diagnosis not present

## 2015-12-27 DIAGNOSIS — E1165 Type 2 diabetes mellitus with hyperglycemia: Secondary | ICD-10-CM | POA: Diagnosis not present

## 2015-12-27 DIAGNOSIS — Z794 Long term (current) use of insulin: Secondary | ICD-10-CM

## 2015-12-27 DIAGNOSIS — E063 Autoimmune thyroiditis: Secondary | ICD-10-CM

## 2015-12-27 DIAGNOSIS — I10 Essential (primary) hypertension: Secondary | ICD-10-CM | POA: Diagnosis not present

## 2015-12-27 MED ORDER — GLUCOSE BLOOD VI STRP: ORAL_STRIP | 5 refills | Status: DC

## 2015-12-27 NOTE — Progress Notes (Signed)
[name] 82 y.o.            Reason for Appointment: Diabetes follow-up   History of Present Illness   Diagnosis: Type 2 DIABETES MELITUS, date of diagnosis:   2000   She has been on insulin since 2004 with difficulty controlling adequately because of compliance with diet and variability in blood sugars. Her previous records are not available at present, however she has been on basal insulin with mealtime coverage since at least 2007 She has been tried on Byetta previously but did not tolerate this well She tried Victoza but didn't not benefit from Victoza 0.6 dosage; had nausea from 1.2 mg; did not continue this because of this side effect She also was given a trial of the V-go pump but she subjectively did not like this  RECENT history:    INSULIN: Levemir 26 qd, HUMALOG 6-8 acb, 6-8  lunch and 0-4 at supper    Overall she has had somewhat improved control with continuing low dose Invokamet Also on metformin ER separately which she takes in the evening.  However A1c is usually around 8% Fructosamine is relatively high although slightly better than the previous one  She has the following blood sugar patterns and problems identified  She is having higher blood sugars in the morning  Not clear why she change her Levemir to once a day even though she was told to take 14 in the morning and 10 in the evening on her last visit  Also not clear if her meter is accurate as her glucose was 195 on the morning her lab glucose was 111 later that morning without any insulin  However her blood sugars before suppertime or generally much better  Still having high POSTPRANDIAL readings after lunch but she is cutting back to 6 units at lunch at times for Humalog  Still has relatively high readings late morning at times  Blood sugars after eating supper are usually lower since she is eating a smaller meal  No hypoglycemia  She has tolerated Invokamet and her weight is better  Oral  hypoglycemic drugs: Metformin 750 mg at supper, Invokamet 50/1000 at 9 am     .         Monitors blood glucose:  about 2-3 times daily     Glucometer: One Touch.          Blood Glucose readings from download:  Mean values apply above for all meters except median for One Touch  PRE-MEAL Fasting Lunch Dinner Bedtime Overall  Glucose range: 178-238  145-337  74-245  108-312    Mean/median: 208 160  159   181   POST-MEAL PC Breakfast PC Lunch PC Dinner  Glucose range:  114-242    Mean/median:  161      Meals: 3 meals per day:  breakfast is mostly peanut butter or cheese crackers 7 am;  lunch 12-2 pm, dinner 5 pm Physical activity: exercise: walks upto 30 min; 1-3/7  days, less recently  Wt Readings from Last 3 Encounters:  12/27/15 177 lb (80.3 kg)  10/27/15 182 lb (82.6 kg)  09/15/15 178 lb 3.2 oz (80.8 kg)    Lab Results  Component Value Date   HGBA1C 7.9 (H) 10/22/2015   HGBA1C 8.0 (H) 08/02/2015   HGBA1C 7.6 (H) 04/02/2015   Lab Results  Component Value Date   MICROALBUR 10.1 (H) 08/02/2015   LDLCALC 69 08/02/2015   CREATININE 0.96 12/22/2015    Lab on 12/22/2015  Component Date Value Ref Range Status  . Sodium 12/22/2015 137  135 - 145 mEq/L Final  . Potassium 12/22/2015 3.8  3.5 - 5.1 mEq/L Final  . Chloride 12/22/2015 100  96 - 112 mEq/L Final  . CO2 12/22/2015 30  19 - 32 mEq/L Final  . Glucose, Bld 12/22/2015 111* 70 - 99 mg/dL Final  . BUN 12/22/2015 18  6 - 23 mg/dL Final  . Creatinine, Ser 12/22/2015 0.96  0.40 - 1.20 mg/dL Final  . Calcium 12/22/2015 9.6  8.4 - 10.5 mg/dL Final  . GFR 12/22/2015 71.78  >60.00 mL/min Final  . Fructosamine 12/23/2015 336* 0 - 285 umol/L Final   Comment: Published reference interval for apparently healthy subjects between age 44 and 53 is 82 - 285 umol/L and in a poorly controlled diabetic population is 228 - 563 umol/L with a mean of 396 umol/L.        Medication List       Accurate as of 12/27/15 10:15 AM. Always  use your most recent med list.          aspirin EC 81 MG tablet Take 1 tablet (81 mg total) by mouth daily.   B-D UF III MINI PEN NEEDLES 31G X 5 MM Misc Generic drug:  Insulin Pen Needle USE 3 PER DAY   benazepril 20 MG tablet Commonly known as:  LOTENSIN Take by mouth daily. Take 1/2 tablet by mouth daily   Canagliflozin-Metformin HCl 50-1000 MG Tabs Commonly known as:  INVOKAMET Take 1 tablet by mouth 2 (two) times daily.   carvedilol 12.5 MG tablet Commonly known as:  COREG TAKE 1 TABLET (12.5 MG TOTAL) BY MOUTH 2 (TWO) TIMES DAILY.   cholecalciferol 1000 units tablet Commonly known as:  VITAMIN D Take 1,000 Units by mouth daily.   Co Q-10 100 MG Caps Take 1 capsule by mouth 2 (two) times daily.   felodipine 10 MG 24 hr tablet Commonly known as:  PLENDIL Take 1 tablet (10 mg total) by mouth daily.   furosemide 20 MG tablet Commonly known as:  LASIX Take 1 tablet (20 mg total) by mouth daily as needed for fluid or edema (or weight gain).   Insulin Detemir 100 UNIT/ML Pen Commonly known as:  LEVEMIR FLEXTOUCH INJECT 22 UNITS INTO THE SKIN DAILY BEFORE BREAKFAST.   insulin lispro 100 UNIT/ML KiwkPen Commonly known as:  HUMALOG Inject 0.06-0.08 mLs (6-8 Units total) into the skin 2 (two) times daily.   levothyroxine 75 MCG tablet Commonly known as:  SYNTHROID, LEVOTHROID TAKE 1 TABLET BY MOUTH DAILY   metFORMIN 750 MG 24 hr tablet Commonly known as:  GLUCOPHAGE-XR Take 2 tablets daily   multivitamins ther. w/minerals Tabs tablet Take 1 tablet by mouth daily.   rosuvastatin 20 MG tablet Commonly known as:  CRESTOR TAKE 1 TABLET (20 MG TOTAL) BY MOUTH DAILY.   solifenacin 5 MG tablet Commonly known as:  VESICARE Take 1 tablet (5 mg total) by mouth daily.   TUMS 500 MG chewable tablet Generic drug:  calcium carbonate Chew 1 tablet by mouth daily as needed for heartburn (indigestion).       Allergies: No Known Allergies  Past Medical History:    Diagnosis Date  . Aortic valve disorders    S/P AVR 06/2012  . Arthritis   . Asthma    AS CHILD   . Depression   . Diabetes mellitus   . Heart murmur   . Hyperlipidemia   . Hypertension   .  Hypothyroidism   . Lumbago   . Shortness of breath   . Thyroid disease     Past Surgical History:  Procedure Laterality Date  . AORTIC VALVE REPLACEMENT N/A 06/25/2012   Procedure: AORTIC VALVE REPLACEMENT (AVR);  Surgeon: Grace Isaac, MD;  Location: [facility];  Service: Open Heart Surgery;  Laterality: N/A;  . BREAST SURGERY     LT BREAST BX BENIGN  . CORONARY ARTERY BYPASS GRAFT N/A 06/25/2012   Procedure: Coronary artery bypass graft  times two using left internal mammary artery and right leg greater saphenous vein;  Surgeon: Grace Isaac, MD;  Location: [facility];  Service: Open Heart Surgery;  Laterality: N/A;  . INTRAOPERATIVE TRANSESOPHAGEAL ECHOCARDIOGRAM N/A 06/25/2012   Procedure: INTRAOPERATIVE TRANSESOPHAGEAL ECHOCARDIOGRAM;  Surgeon: Grace Isaac, MD;  Location: [facility];  Service: Open Heart Surgery;  Laterality: N/A;  . TUBAL LIGATION      Family History  Problem Relation Age of Onset  . Heart disease Father     Social History:  reports that she has never smoked. She does not have any smokeless tobacco history on file. She reports that she does not drink alcohol or use drugs.  Review of Systems:   HYPERTENSION: taking multiple drugs.  Blood pressure is well-controlled, followed by PCP   HYPOTHYROIDISM: She has had long-standing hypothyroidism  She has had consistent TSH level with current 75 g dose   Lab Results  Component Value Date   TSH 1.33 10/22/2015   TSH 1.06 06/02/2015   TSH 1.02 04/02/2015   FREET4 1.13 06/02/2015   FREET4 1.07 03/11/2014   FREET4 1.01 09/25/2013    HYPERLIPIDEMIA: The lipid abnormality consists of elevated LDL; has been  controlled with Crestor 20 mg Being managed by PCP.   LDL  below 100    Lab Results  Component Value  Date   CHOL 134 08/02/2015   HDL 48.30 08/02/2015   LDLCALC 69 08/02/2015   LDLDIRECT 102.8 11/28/2013   TRIG 83.0 08/02/2015   CHOLHDL 3 08/02/2015  '    She does see a podiatrist regularly for toenails trimming  Last foot exam was in 12/16: Diabetic foot exam shows normal monofilament sensation in the toes and plantar surfaces, no skin lesions or ulcers on the feet and normal pedal pulses    Examination:   BP 127/67   Pulse 70   Ht 5\' 4"  (1.626 m)   Wt 177 lb (80.3 kg)   BMI 30.38 kg/m   Body mass index is 30.38 kg/m.    ASSESSMENT/ PLAN:   Diabetes type 2, uncontrolled   See history of present illness for a discussion of the blood sugar patterns, recent management and problems identified  Although she is using an older One Touch ultra meter she appears to be getting much higher fasting readings lately She apparently has switched her Levemir to once a day instead of twice a day on her own and without the evening dose of fasting readings are significantly high at home Fructosamine is high However with taking a large dose of Levemir in the morning her afternoon readings are generally much better without hypoglycemia  Also may be benefiting from restarting her Invokana, weight is slightly better She can do better with exercise and walking She does have some high readings after lunch especially when she takes only 6 units of Humalog coverage  Recommendations:  She will need to start taking Levemir twice a day  She will start with 8 units  at suppertime and given her a flowsheet to adjust the dose every 3 days by 1 unit to fasting readings are below 130.  Discussed how to adjust the dose  Meanwhile reduce morning dose to 22 units  8 units of Humalog at lunch  Continue Invokana  More consistent exercise  More consistent use of insulin when she is eating out at lunchtime   HYPERTENSION: Well controlled without orthostatic symptoms, continue same  dose  Hypothyroidism: To have follow-up TSH on the next visit   Patient Instructions  Levemir 22 in am and 8 at at nite      Counseling time on subjects discussed above is over 50% of today's 25 minute visit   Emaya Preston 12/27/2015, 10:15 AM   Note: This office note was prepared with Estate agent. Any transcriptional errors that result from this process are unintentional.

## 2015-12-27 NOTE — Patient Instructions (Signed)
Levemir 22 in am and 8 at at [facility]

## 2016-01-20 DIAGNOSIS — H35351 Cystoid macular degeneration, right eye: Secondary | ICD-10-CM | POA: Diagnosis not present

## 2016-01-20 DIAGNOSIS — H26491 Other secondary cataract, right eye: Secondary | ICD-10-CM | POA: Diagnosis not present

## 2016-01-20 DIAGNOSIS — H04122 Dry eye syndrome of left lacrimal gland: Secondary | ICD-10-CM | POA: Diagnosis not present

## 2016-01-20 DIAGNOSIS — H524 Presbyopia: Secondary | ICD-10-CM | POA: Diagnosis not present

## 2016-01-21 ENCOUNTER — Encounter: Payer: Self-pay | Admitting: Nurse Practitioner

## 2016-02-02 ENCOUNTER — Ambulatory Visit: Payer: Medicare Other | Admitting: Podiatry

## 2016-02-02 ENCOUNTER — Other Ambulatory Visit (INDEPENDENT_AMBULATORY_CARE_PROVIDER_SITE_OTHER): Payer: Medicare Other

## 2016-02-02 DIAGNOSIS — Z794 Long term (current) use of insulin: Secondary | ICD-10-CM

## 2016-02-02 DIAGNOSIS — E1165 Type 2 diabetes mellitus with hyperglycemia: Secondary | ICD-10-CM | POA: Diagnosis not present

## 2016-02-02 LAB — COMPREHENSIVE METABOLIC PANEL
ALBUMIN: 3.9 g/dL (ref 3.5–5.2)
ALK PHOS: 35 U/L — AB (ref 39–117)
ALT: 11 U/L (ref 0–35)
AST: 17 U/L (ref 0–37)
BILIRUBIN TOTAL: 0.3 mg/dL (ref 0.2–1.2)
BUN: 29 mg/dL — ABNORMAL HIGH (ref 6–23)
CO2: 29 mEq/L (ref 19–32)
Calcium: 9.6 mg/dL (ref 8.4–10.5)
Chloride: 101 mEq/L (ref 96–112)
Creatinine, Ser: 1.28 mg/dL — ABNORMAL HIGH (ref 0.40–1.20)
GFR: 51.49 mL/min — AB (ref >60.00)
GLUCOSE: 145 mg/dL — AB (ref 70–99)
Potassium: 3.6 mEq/L (ref 3.5–5.1)
Sodium: 138 mEq/L (ref 135–145)
TOTAL PROTEIN: 7.7 g/dL (ref 6.0–8.3)

## 2016-02-02 LAB — TSH: TSH: 1.74 u[IU]/mL (ref 0.35–4.50)

## 2016-02-02 LAB — HEMOGLOBIN A1C: Hgb A1c MFr Bld: 8 % — ABNORMAL HIGH (ref 4.6–6.5)

## 2016-02-03 DIAGNOSIS — E113313 Type 2 diabetes mellitus with moderate nonproliferative diabetic retinopathy with macular edema, bilateral: Secondary | ICD-10-CM | POA: Diagnosis not present

## 2016-02-03 DIAGNOSIS — H43813 Vitreous degeneration, bilateral: Secondary | ICD-10-CM | POA: Diagnosis not present

## 2016-02-03 DIAGNOSIS — H04122 Dry eye syndrome of left lacrimal gland: Secondary | ICD-10-CM | POA: Diagnosis not present

## 2016-02-07 ENCOUNTER — Encounter: Payer: Self-pay | Admitting: Endocrinology

## 2016-02-07 ENCOUNTER — Ambulatory Visit (INDEPENDENT_AMBULATORY_CARE_PROVIDER_SITE_OTHER): Payer: Medicare Other | Admitting: Endocrinology

## 2016-02-07 ENCOUNTER — Other Ambulatory Visit: Payer: Self-pay | Admitting: *Deleted

## 2016-02-07 VITALS — BP 118/69 | HR 74 | Temp 98.0°F | Resp 16 | Ht 64.0 in | Wt 172.6 lb

## 2016-02-07 DIAGNOSIS — I10 Essential (primary) hypertension: Secondary | ICD-10-CM

## 2016-02-07 DIAGNOSIS — E1165 Type 2 diabetes mellitus with hyperglycemia: Secondary | ICD-10-CM | POA: Diagnosis not present

## 2016-02-07 DIAGNOSIS — Z23 Encounter for immunization: Secondary | ICD-10-CM | POA: Diagnosis not present

## 2016-02-07 DIAGNOSIS — Z794 Long term (current) use of insulin: Secondary | ICD-10-CM

## 2016-02-07 DIAGNOSIS — E038 Other specified hypothyroidism: Secondary | ICD-10-CM

## 2016-02-07 DIAGNOSIS — E063 Autoimmune thyroiditis: Secondary | ICD-10-CM

## 2016-02-07 MED ORDER — INSULIN GLARGINE 300 UNIT/ML ~~LOC~~ SOPN: 30.0000 [IU] | PEN_INJECTOR | Freq: Every day | SUBCUTANEOUS | 3 refills | Status: DC

## 2016-02-07 NOTE — Progress Notes (Signed)
CARDIOLOGY OFFICE NOTE  Date:  02/08/2016    [name] Date of Birth: 02-17-1933 Medical Record [id number]  PCP:  Rogers Blocker, MD  Cardiologist:  [facility]    Chief Complaint  Patient presents with  . Coronary Artery Disease    6 month check - seen for Dr. Acie Fredrickson    History of Present Illness: [name] is a 82 y.o. female who presents today for a 6 month check. Seen for Dr. Acie Fredrickson. She now primarily follows with me.   She has a history of DM, prior AVR and CABG x 2 per EBG back in 2014 - this was with a LIMA to the LAD and SVG to the LCX, with a pericardial Edwards tissue valve #21 mm. Did have post op atrial fib - was on amiodarone. Other issues include HLD, HTN, DM, depression and hypothyroidism. Myoview ok from 06/2013. Last echo 12/2014.   I saw her back in September of 2016 for a work in visit - she was short of breath. Labs looked ok. Blood count actually improved. Echo updated - did have elevated filling pressures but otherwise unchanged. Prosthetic AV ok. Normal EF and had grade 1 diastolic dysfunction. I wanted to put her on low dose Lasix but she did not wish to proceed due to having frequent urination when on Invokana.   Seen in November of 2016 by me and she was doing ok- not really clear as to why she had improved but she had. She had talked with Dr. Dwyane Dee and he also encouraged prn diuretic. She was agreeable to trying. Last seen back in April and she was doing well - using prn diuretics. BP had been running lower and her Plendil was cut back.   Back here today. Here with her sister - they live together. Saw Dr. Dwyane Dee yesterday. A1C still running around 8. No chest pain. Breathing is good. Weight is down - she says it is by eating a meal at [employer] every day. She is happy with how she is doing. Her Plendil has been stopped now altogether by Dr. Dwyane Dee due to low BP. She remains tired and fatigued. Has some periodic episodes of "sweating" - not  really clear as to the etiology of this. Not having any issues with swelling - not having to use her Lasix.   Past Medical History:  Diagnosis Date  . Aortic valve disorders    S/P AVR 06/2012  . Arthritis   . Asthma    AS CHILD   . Depression   . Diabetes mellitus   . Heart murmur   . Hyperlipidemia   . Hypertension   . Hypothyroidism   . Ischemic heart disease    with prior CABG x 2 in 2014 along with AVR  . Lumbago   . Shortness of breath   . Thyroid disease     Past Surgical History:  Procedure Laterality Date  . AORTIC VALVE REPLACEMENT N/A 06/25/2012   Procedure: AORTIC VALVE REPLACEMENT (AVR);  Surgeon: Grace Isaac, MD;  Location: [facility];  Service: Open Heart Surgery;  Laterality: N/A;  . BREAST SURGERY     LT BREAST BX BENIGN  . CORONARY ARTERY BYPASS GRAFT N/A 06/25/2012   Procedure: Coronary artery bypass graft  times two using left internal mammary artery and right leg greater saphenous vein;  Surgeon: Grace Isaac, MD;  Location: [place];  Service: Open Heart Surgery;  Laterality: N/A;  . INTRAOPERATIVE TRANSESOPHAGEAL  ECHOCARDIOGRAM N/A 06/25/2012   Procedure: INTRAOPERATIVE TRANSESOPHAGEAL ECHOCARDIOGRAM;  Surgeon: Grace Isaac, MD;  Location: [facility];  Service: Open Heart Surgery;  Laterality: N/A;  . TUBAL LIGATION       Medications: Current Outpatient Prescriptions  Medication Sig Dispense Refill  . aspirin EC 81 MG tablet Take 1 tablet (81 mg total) by mouth daily.    . B-D UF III MINI PEN NEEDLES 31G X 5 MM MISC USE 3 PER DAY 100 each 2  . benazepril (LOTENSIN) 20 MG tablet Take by mouth daily. Take 1/2 tablet by mouth daily    . calcium carbonate (TUMS) 500 MG chewable tablet Chew 1 tablet by mouth daily as needed for heartburn (indigestion).    . Canagliflozin-Metformin HCl (INVOKAMET) 50-1000 MG TABS Take 1 tablet by mouth 2 (two) times daily. 180 tablet 1  . carvedilol (COREG) 12.5 MG tablet TAKE 1 TABLET (12.5 MG TOTAL) BY MOUTH 2 (TWO) TIMES  DAILY. 60 tablet 9  . cholecalciferol (VITAMIN D) 1000 UNITS tablet Take 1,000 Units by mouth daily.    . Coenzyme Q10 (CO Q-10) 100 MG CAPS Take 1 capsule by mouth 2 (two) times daily.    . furosemide (LASIX) 20 MG tablet Take 1 tablet (20 mg total) by mouth daily as needed for fluid or edema (or weight gain). 30 tablet 6  . glucose blood (ONETOUCH VERIO) test strip Use to test blood sugar 3 times daily 150 each 5  . Insulin Detemir (LEVEMIR FLEXTOUCH) 100 UNIT/ML Pen INJECT 22 UNITS INTO THE SKIN DAILY BEFORE BREAKFAST. (Patient taking differently: INJECT 11 UNITS INTO THE SKIN TWICE A DAY) 15 mL 5  . insulin lispro (HUMALOG) 100 UNIT/ML KiwkPen Inject 0.06-0.08 mLs (6-8 Units total) into the skin 2 (two) times daily. 15 mL 3  . levothyroxine (SYNTHROID, LEVOTHROID) 75 MCG tablet TAKE 1 TABLET BY MOUTH DAILY 30 tablet 5  . metFORMIN (GLUCOPHAGE-XR) 750 MG 24 hr tablet Take 2 tablets daily 180 tablet 1  . Multiple Vitamins-Minerals (MULTIVITAMINS THER. W/MINERALS) TABS Take 1 tablet by mouth daily.    . rosuvastatin (CRESTOR) 20 MG tablet TAKE 1 TABLET (20 MG TOTAL) BY MOUTH DAILY. 30 tablet 3   No current facility-administered medications for this visit.     Allergies: No Known Allergies  Social History: The patient  reports that she has never smoked. She has never used smokeless tobacco. She reports that she does not drink alcohol or use drugs.   Family History: The patient's family history includes Heart disease in her father.   Review of Systems: Please see the history of present illness.   Otherwise, the review of systems is positive for none.   All other systems are reviewed and negative.   Physical Exam: VS:  BP 100/60   Pulse 71   Ht 5\' 4"  (1.626 m)   Wt 172 lb 12.8 oz (78.4 kg)   BMI 29.66 kg/m  .  BMI Body mass index is 29.66 kg/m.  Wt Readings from Last 3 Encounters:  02/08/16 172 lb 12.8 oz (78.4 kg)  02/07/16 172 lb 9.6 oz (78.3 kg)  12/27/15 177 lb (80.3 kg)     General: Pleasant. Well developed, well nourished and in no acute distress. She has lost 10 pounds since June.   HEENT: Normal.  Neck: Supple, no JVD, carotid bruits, or masses noted.  Cardiac: Regular rate and rhythm. Soft outflow murmur. No edema.  Respiratory:  Lungs are clear to auscultation bilaterally with normal work of  breathing.  GI: Soft and nontender.  MS: No deformity or atrophy. Gait and ROM intact.  Skin: Warm and dry. Color is normal.  Neuro:  Strength and sensation are intact and no gross focal deficits noted.  Psych: Alert, appropriate and with normal affect.   LABORATORY DATA:  EKG:  EKG is ordered today. This shows NSR.   Lab Results  Component Value Date   WBC 5.8 01/19/2015   HGB 11.2 (L) 01/19/2015   HCT 34.7 (L) 01/19/2015   PLT 222.0 01/19/2015   GLUCOSE 145 (H) 02/02/2016   CHOL 134 08/02/2015   TRIG 83.0 08/02/2015   HDL 48.30 08/02/2015   LDLDIRECT 102.8 11/28/2013   LDLCALC 69 08/02/2015   ALT 11 02/02/2016   AST 17 02/02/2016   NA 138 02/02/2016   K 3.6 02/02/2016   CL 101 02/02/2016   CREATININE 1.28 (H) 02/02/2016   BUN 29 (H) 02/02/2016   CO2 29 02/02/2016   TSH 1.74 02/02/2016   INR 1.57 (H) 06/25/2012   HGBA1C 8.0 (H) 02/02/2016   MICROALBUR 10.1 (H) 08/02/2015    BNP (last 3 results) No results for input(s): BNP in the last 8760 hours.  ProBNP (last 3 results) No results for input(s): PROBNP in the last 8760 hours.   Other Studies Reviewed Today:  Echo Study Conclusions from 12/2014  - Left ventricle: The cavity size was normal. There was mild focal basal hypertrophy of the septum. Systolic function was normal. The estimated ejection fraction was in the range of 55% to 60%. Wall motion was normal; there were no regional wall motion abnormalities. There was an increased relative contribution of atrial contraction to ventricular filling. Doppler parameters are consistent with abnormal left ventricular  relaxation (grade 1 diastolic dysfunction). Doppler parameters are consistent with high ventricular filling pressure. - Aortic valve: Poorly visualized. A bioprosthesis was present and functioning normally. Peak velocity (S): 248 cm/s. Mean gradient (S): 12 mm Hg. Valve area (VTI): 0.94 cm^2. Valve area (Vmax): 0.97 cm^2. Valve area (Vmean): 0.95 cm^2. - Mitral valve: Calcified annulus. Mild diffuse thickening. There was mild regurgitation. - Left atrium: The atrium was mildly dilated. - Pulmonic valve: There was trivial regurgitation.    Myoview Impression from 06/2013 Exercise Capacity: Lexiscan with no exercise. BP Response: Normal blood pressure response. Clinical Symptoms: No significant symptoms noted. ECG Impression: There are scattered PVCs. Comparison with Prior Nuclear Study: No previous nuclear study performed  Overall Impression: Low risk stress nuclear study small fixed distal anterior artifact which could be breast attenuation or other artifact.  LV Ejection Fraction: 66%. LV Wall Motion: Normal Wall Motion  [name], MD, [facility]    Assessment/Plan: 1. CAD - s/p CABG 06-27-12 -last Myoview stable from 2015. No active chest pain.Would continue with CV risk factor modification.    2. Aortic stenosis with prior AVR - echo from 2016 was ok. Did have elevated filling pressures - Now using low dose Lasix prn. She has improved clinically.   3. Post op atrial fibrillation - remains in NSR by exam and EKG today.  4. HTN - BP low - Plendil stopped yesterday - have asked her to monitor at home - see back in a month - may need to start cutting back her Coreg.   5. Obesity - intentionally losing weight.   6. Sweats - not clear as to the etiology - CBC today  7. Fatigue - most likely multifactorial - checking CBC today. Recent TSh ok. See back in a month  and may be able to start cutting back on her Coreg.   Current medicines are reviewed  with the patient today.  The patient does not have concerns regarding medicines other than what has been noted above.  The following changes have been made:  See above.  Labs/ tests ordered today include:    Orders Placed This Encounter  Procedures  . CBC  . EKG 12-Lead     Disposition:   FU with me in 1 month   Patient is agreeable to this plan and will call if any problems develop in the interim.   Signed: Burtis Junes, RN, ANP-C 02/08/2016 9:45 AM  [facility] [address] [place], [place]  [address] Phone: [phone] Fax: [phone]

## 2016-02-07 NOTE — Patient Instructions (Addendum)
Levemir 18 units in am and 14 at dinner  Toujeo will be 30 units in am once daily  6 Humalog if eating out or if more carbs or fat  Stop Plendil

## 2016-02-07 NOTE — Progress Notes (Signed)
[name] 82 y.o.            Reason for Appointment: Diabetes follow-up   History of Present Illness   Diagnosis: Type 2 DIABETES MELITUS, date of diagnosis:   2000   She has been on insulin since 2004 with difficulty controlling adequately because of compliance with diet and variability in blood sugars. Her previous records are not available at present, however she has been on basal insulin with mealtime coverage since at least 2007 She has been tried on Byetta previously but did not tolerate this well She tried Victoza but didn't not benefit from Victoza 0.6 dosage; had nausea from 1.2 mg; did not continue this because of this side effect She also was given a trial of the V-go pump but she subjectively did not like this  RECENT history:    INSULIN: Levemir 11 units twice a day , HUMALOG 6-8 acb, 4-6  lunch and 0-4 at supper    She was told to split her Levemir last time and take 22 units in morning at 8 in the evening but she is only taking 11 units in the morning   A1c is usually around 8%  She has the following blood sugar patterns and problems identified  She is again having higher blood sugars in the morning even with using 11 units of Levemir in the evening  Blood sugars are markedly high mid-day and afternoon and frequently high before supper also although sometimes she will have a late lunch with the mealtime bolus  This is despite her saying that she is taking her insulin before eating consistently but may be reducing it excessively because of fear of hypoglycemia including taking only 4 units sometimes at lunch  She is trying to eat out less  Blood sugars after eating supper are quite variable now and has some high readings also  No hypoglycemia in the last 2 weeks but she thinks she had a low episode after dinner once.  She does not always reduce her insulin enough if eating a small meal in the evening, sometimes having only a snack  She has tolerated  Invokamet low-dose  Oral hypoglycemic drugs: Metformin 750 mg at supper, Invokamet 50/1000 at 9 am     .         Monitors blood glucose:  about 2-3 times daily     Glucometer: One Touch.          Blood Glucose readings from download:  Mean values apply above for all meters except median for One Touch  PRE-MEAL Fasting Lunch Dinner PCS  Overall  Glucose range: 107-221  159-295  97-277  115-287    Mean/median: 170   223  182    Meals: 3 meals per day:  breakfast is mostly peanut butter or cheese crackers 7 am;  lunch 12-2 pm, dinner 5 pm Physical activity: exercise: walks upto 30 min; 1-3/7  days, less recently  Wt Readings from Last 3 Encounters:  02/07/16 172 lb 9.6 oz (78.3 kg)  12/27/15 177 lb (80.3 kg)  10/27/15 182 lb (82.6 kg)    Lab Results  Component Value Date   HGBA1C 8.0 (H) 02/02/2016   HGBA1C 7.9 (H) 10/22/2015   HGBA1C 8.0 (H) 08/02/2015   Lab Results  Component Value Date   MICROALBUR 10.1 (H) 08/02/2015   LDLCALC 69 08/02/2015   CREATININE 1.28 (H) 02/02/2016    Lab on 02/02/2016  Component Date Value Ref Range Status  . Sodium 02/02/2016  138  135 - 145 mEq/L Final  . Potassium 02/02/2016 3.6  3.5 - 5.1 mEq/L Final  . Chloride 02/02/2016 101  96 - 112 mEq/L Final  . CO2 02/02/2016 29  19 - 32 mEq/L Final  . Glucose, Bld 02/02/2016 145* 70 - 99 mg/dL Final  . BUN 02/02/2016 29* 6 - 23 mg/dL Final  . Creatinine, Ser 02/02/2016 1.28* 0.40 - 1.20 mg/dL Final  . Total Bilirubin 02/02/2016 0.3  0.2 - 1.2 mg/dL Final  . Alkaline Phosphatase 02/02/2016 35* 39 - 117 U/L Final  . AST 02/02/2016 17  0 - 37 U/L Final  . ALT 02/02/2016 11  0 - 35 U/L Final  . Total Protein 02/02/2016 7.7  6.0 - 8.3 g/dL Final  . Albumin 02/02/2016 3.9  3.5 - 5.2 g/dL Final  . Calcium 02/02/2016 9.6  8.4 - 10.5 mg/dL Final  . GFR 02/02/2016 51.49* >60.00 mL/min Final  . Hgb A1c MFr Bld 02/02/2016 8.0* 4.6 - 6.5 % Final  . TSH 02/02/2016 1.74  0.35 - 4.50 uIU/mL Final         Medication List       Accurate as of 02/07/16  1:25 PM. Always use your most recent med list.          aspirin EC 81 MG tablet Take 1 tablet (81 mg total) by mouth daily.   B-D UF III MINI PEN NEEDLES 31G X 5 MM Misc Generic drug:  Insulin Pen Needle USE 3 PER DAY   benazepril 20 MG tablet Commonly known as:  LOTENSIN Take by mouth daily. Take 1/2 tablet by mouth daily   Canagliflozin-Metformin HCl 50-1000 MG Tabs Commonly known as:  INVOKAMET Take 1 tablet by mouth 2 (two) times daily.   carvedilol 12.5 MG tablet Commonly known as:  COREG TAKE 1 TABLET (12.5 MG TOTAL) BY MOUTH 2 (TWO) TIMES DAILY.   cholecalciferol 1000 units tablet Commonly known as:  VITAMIN D Take 1,000 Units by mouth daily.   Co Q-10 100 MG Caps Take 1 capsule by mouth 2 (two) times daily.   felodipine 10 MG 24 hr tablet Commonly known as:  PLENDIL Take 1 tablet (10 mg total) by mouth daily.   furosemide 20 MG tablet Commonly known as:  LASIX Take 1 tablet (20 mg total) by mouth daily as needed for fluid or edema (or weight gain).   glucose blood test strip Commonly known as:  ONETOUCH VERIO Use to test blood sugar 3 times daily   Insulin Detemir 100 UNIT/ML Pen Commonly known as:  LEVEMIR FLEXTOUCH INJECT 22 UNITS INTO THE SKIN DAILY BEFORE BREAKFAST.   Insulin Glargine 300 UNIT/ML Sopn Commonly known as:  TOUJEO SOLOSTAR Inject 30 Units into the skin daily.   insulin lispro 100 UNIT/ML KiwkPen Commonly known as:  HUMALOG Inject 0.06-0.08 mLs (6-8 Units total) into the skin 2 (two) times daily.   levothyroxine 75 MCG tablet Commonly known as:  SYNTHROID, LEVOTHROID TAKE 1 TABLET BY MOUTH DAILY   metFORMIN 750 MG 24 hr tablet Commonly known as:  GLUCOPHAGE-XR Take 2 tablets daily   multivitamins ther. w/minerals Tabs tablet Take 1 tablet by mouth daily.   rosuvastatin 20 MG tablet Commonly known as:  CRESTOR TAKE 1 TABLET (20 MG TOTAL) BY MOUTH DAILY.   solifenacin 5 MG  tablet Commonly known as:  VESICARE Take 1 tablet (5 mg total) by mouth daily.   TUMS 500 MG chewable tablet Generic drug:  calcium carbonate Chew 1 tablet  by mouth daily as needed for heartburn (indigestion).       Allergies: No Known Allergies  Past Medical History:  Diagnosis Date  . Aortic valve disorders    S/P AVR 06/2012  . Arthritis   . Asthma    AS CHILD   . Depression   . Diabetes mellitus   . Heart murmur   . Hyperlipidemia   . Hypertension   . Hypothyroidism   . Lumbago   . Shortness of breath   . Thyroid disease     Past Surgical History:  Procedure Laterality Date  . AORTIC VALVE REPLACEMENT N/A 06/25/2012   Procedure: AORTIC VALVE REPLACEMENT (AVR);  Surgeon: Grace Isaac, MD;  Location: [place];  Service: Open Heart Surgery;  Laterality: N/A;  . BREAST SURGERY     LT BREAST BX BENIGN  . CORONARY ARTERY BYPASS GRAFT N/A 06/25/2012   Procedure: Coronary artery bypass graft  times two using left internal mammary artery and right leg greater saphenous vein;  Surgeon: Grace Isaac, MD;  Location: [place];  Service: Open Heart Surgery;  Laterality: N/A;  . INTRAOPERATIVE TRANSESOPHAGEAL ECHOCARDIOGRAM N/A 06/25/2012   Procedure: INTRAOPERATIVE TRANSESOPHAGEAL ECHOCARDIOGRAM;  Surgeon: Grace Isaac, MD;  Location[place];  Service: Open Heart Surgery;  Laterality: N/A;  . TUBAL LIGATION      Family History  Problem Relation Age of Onset  . Heart disease Father     Social History:  reports that she has never smoked. She has never used smokeless tobacco. She reports that she does not drink alcohol or use drugs.  Review of Systems:   HYPERTENSION: taking multiple drugs.  Blood pressure is well-controlled, followed by PCP However blood pressure appears to be relatively lower now Creatinine is slightly higher compared to previous readings   HYPOTHYROIDISM: She has had long-standing hypothyroidism  She has had consistent TSH level with current 75 g  dose, Now 1.7   Lab Results  Component Value Date   TSH 1.74 02/02/2016   TSH 1.33 10/22/2015   TSH 1.06 06/02/2015   FREET4 1.13 06/02/2015   FREET4 1.07 03/11/2014   FREET4 1.01 09/25/2013    HYPERLIPIDEMIA: The lipid abnormality consists of elevated LDL; has been  controlled with Crestor 20 mg Being managed by PCP .   LDL  below 100 previously    Lab Results  Component Value Date   CHOL 134 08/02/2015   HDL 48.30 08/02/2015   LDLCALC 69 08/02/2015   LDLDIRECT 102.8 11/28/2013   TRIG 83.0 08/02/2015   CHOLHDL 3 08/02/2015  '    She does see a podiatrist regularly for toenail trimming  Last foot exam was in 12/16: Diabetic foot exam shows normal monofilament sensation in the toes and plantar surfaces, no skin lesions or ulcers on the feet and normal pedal pulses    Examination:   BP 118/69   Pulse 74   Temp 98 F (36.7 C)   Resp 16   Ht 5\' 4"  (1.626 m)   Wt 172 lb 9.6 oz (78.3 kg)   SpO2 97%   BMI 29.63 kg/m   Body mass index is 29.63 kg/m.    ASSESSMENT/ PLAN:   Diabetes type 2, uncontrolled   See history of present illness for a discussion of the blood sugar patterns, recent management and problems identified  She Appears to be not getting enough basal insulin with using Levemir She is still reluctant to try other types of insulin because of cost and  also does not like the V-go pump Blood sugars are usually significantly high mid-day and afternoons from a combination of inadequate basal and bolus insulin  Recommendations:  She will need to start taking 18 units Levemir instead of 11 in the morning  Discussed how Toujeo is different and she will start with 30 units once a day  She can finish her Levemir before switching  More consistent coverage of higher fat or higher carbohydrate meals with 6-8 units of Humalog  More consistent exercise    HYPERTENSION: Well controlled without orthostatic symptoms but blood pressure is relatively no Since  her creatinine is relatively higher than before she can try to leave off her Plendil for now Follow-up with cardiologist and PCP  Hypothyroidism: Adequately controlled   Patient Instructions  Levemir 18 units in am and 14 at dinner  Toujeo will be 30 units in am once daily  6 Humalog if eating out or if more carbs or fat  Stop Plendil     Counseling time on subjects discussed above is over 50% of today's 25 minute visit   Mariann Palo 02/07/2016, 1:25 PM   Note: This office note was prepared with Estate agent. Any transcriptional errors that result from this process are unintentional.

## 2016-02-08 ENCOUNTER — Encounter: Payer: Self-pay | Admitting: Nurse Practitioner

## 2016-02-08 ENCOUNTER — Ambulatory Visit (INDEPENDENT_AMBULATORY_CARE_PROVIDER_SITE_OTHER): Payer: Medicare Other | Admitting: Nurse Practitioner

## 2016-02-08 VITALS — BP 100/60 | HR 71 | Ht 64.0 in | Wt 172.8 lb

## 2016-02-08 DIAGNOSIS — I259 Chronic ischemic heart disease, unspecified: Secondary | ICD-10-CM

## 2016-02-08 DIAGNOSIS — R5383 Other fatigue: Secondary | ICD-10-CM | POA: Diagnosis not present

## 2016-02-08 LAB — CBC
HCT: 34.1 % — ABNORMAL LOW (ref 35.0–45.0)
Hemoglobin: 11.3 g/dL — ABNORMAL LOW (ref 11.7–15.5)
MCH: 30.4 pg (ref 27.0–33.0)
MCHC: 33.1 g/dL (ref 32.0–36.0)
MCV: 91.7 fL (ref 80.0–100.0)
MPV: 10.8 fL (ref 7.5–12.5)
Platelets: 232 10*3/uL (ref 140–400)
RBC: 3.72 MIL/uL — ABNORMAL LOW (ref 3.80–5.10)
RDW: 13.9 % (ref 11.0–15.0)
WBC: 5.1 10*3/uL (ref 3.8–10.8)

## 2016-02-08 NOTE — Patient Instructions (Addendum)
We will be checking the following labs today - CBC  Medication Instructions:    Continue with your current medicines.     Testing/Procedures To Be Arranged:  N/A  Follow-Up:   See me in 1 month   Other Special Instructions:   Monitor your blood pressure for me at home - keep a record and bring to your next visit    If you need a refill on your cardiac medications before your next appointment, please call your pharmacy.   Call the [place] office at [phone] if you have any questions, problems or concerns.

## 2016-02-09 ENCOUNTER — Encounter: Payer: Self-pay | Admitting: Podiatry

## 2016-02-09 ENCOUNTER — Ambulatory Visit (INDEPENDENT_AMBULATORY_CARE_PROVIDER_SITE_OTHER): Payer: Medicare Other | Admitting: Podiatry

## 2016-02-09 VITALS — BP 154/66 | HR 77 | Resp 18

## 2016-02-09 DIAGNOSIS — L84 Corns and callosities: Secondary | ICD-10-CM | POA: Diagnosis not present

## 2016-02-09 DIAGNOSIS — B351 Tinea unguium: Secondary | ICD-10-CM | POA: Diagnosis not present

## 2016-02-09 DIAGNOSIS — E119 Type 2 diabetes mellitus without complications: Secondary | ICD-10-CM | POA: Diagnosis not present

## 2016-02-09 DIAGNOSIS — M79676 Pain in unspecified toe(s): Secondary | ICD-10-CM

## 2016-02-09 NOTE — Patient Instructions (Signed)
Diabetes and Foot Care Diabetes may cause you to have problems because of poor blood supply (circulation) to your feet and legs. This may cause the skin on your feet to become thinner, break easier, and heal more slowly. Your skin may become dry, and the skin may peel and crack. You may also have nerve damage in your legs and feet causing decreased feeling in them. You may not notice minor injuries to your feet that could lead to infections or more serious problems. Taking care of your feet is one of the most important things you can do for yourself.  HOME CARE INSTRUCTIONS  Wear shoes at all times, even in the house. Do not go barefoot. Bare feet are easily injured.  Check your feet daily for blisters, cuts, and redness. If you cannot see the bottom of your feet, use a mirror or ask someone for help.  Wash your feet with warm water (do not use hot water) and mild soap. Then pat your feet and the areas between your toes until they are completely dry. Do not soak your feet as this can dry your skin.  Apply a moisturizing lotion or petroleum jelly (that does not contain alcohol and is unscented) to the skin on your feet and to dry, brittle toenails. Do not apply lotion between your toes.  Trim your toenails straight across. Do not dig under them or around the cuticle. File the edges of your nails with an emery board or nail file.  Do not cut corns or calluses or try to remove them with medicine.  Wear clean socks or stockings every day. Make sure they are not too tight. Do not wear knee-high stockings since they may decrease blood flow to your legs.  Wear shoes that fit properly and have enough cushioning. To break in new shoes, wear them for just a few hours a day. This prevents you from injuring your feet. Always look in your shoes before you put them on to be sure there are no objects inside.  Do not cross your legs. This may decrease the blood flow to your feet.  If you find a minor scrape,  cut, or break in the skin on your feet, keep it and the skin around it clean and dry. These areas may be cleansed with mild soap and water. Do not cleanse the area with peroxide, alcohol, or iodine.  When you remove an adhesive bandage, be sure not to damage the skin around it.  If you have a wound, look at it several times a day to make sure it is healing.  Do not use heating pads or hot water bottles. They may burn your skin. If you have lost feeling in your feet or legs, you may not know it is happening until it is too late.  Make sure your health care provider performs a complete foot exam at least annually or more often if you have foot problems. Report any cuts, sores, or bruises to your health care provider immediately. SEEK MEDICAL CARE IF:   You have an injury that is not healing.  You have cuts or breaks in the skin.  You have an ingrown nail.  You notice redness on your legs or feet.  You feel burning or tingling in your legs or feet.  You have pain or cramps in your legs and feet.  Your legs or feet are numb.  Your feet always feel cold. SEEK IMMEDIATE MEDICAL CARE IF:   There is increasing redness,   swelling, or pain in or around a wound.  There is a red line that goes up your leg.  Pus is coming from a wound.  You develop a fever or as directed by your health care provider.  You notice a bad smell coming from an ulcer or wound.   This information is not intended to replace advice given to you by your health care provider. Make sure you discuss any questions you have with your health care provider.   Document Released: 04/14/2000 Document Revised: 12/18/2012 Document Reviewed: 09/24/2012 Elsevier Interactive Patient Education 2016 [employer]  

## 2016-02-10 NOTE — Progress Notes (Signed)
Patient ID: [name], female   DOB: 12-Mar-1933, 82 y.o.   MRN: [id number]    Subjective: This patient presents for scheduled visit complaining of painful toenails when walking wearing shoes and requests nail debridement  Objective: DP pulses 2/4 bilaterally PT pulses 1/4 bilaterally Capillary reflex immediate bilaterally Sensation to 10 g monofilament wire intact 4/5 right and 5/5 left Vibratory sensation reactive bilaterally HAV left Ankle reflexes equal and reactive bilaterally No open skin lesions bilaterally Hammertoe second bilaterally Minimal plantar keratoses right The toenails are elongated, brittle, discolored, incurvated and tender to direct palpation 6-10 Corn second and fifth right toes  Assessment: Diabetic type II without foot complication Symptomatic onychomycoses 6-10 keratoses 3  Plan: Debrided toenails 10 and mechanically and electrically without any bleeding Debride keratoses 3 without any bleeding

## 2016-03-02 DIAGNOSIS — H35351 Cystoid macular degeneration, right eye: Secondary | ICD-10-CM | POA: Diagnosis not present

## 2016-03-02 DIAGNOSIS — H04122 Dry eye syndrome of left lacrimal gland: Secondary | ICD-10-CM | POA: Diagnosis not present

## 2016-03-02 DIAGNOSIS — H26491 Other secondary cataract, right eye: Secondary | ICD-10-CM | POA: Diagnosis not present

## 2016-03-08 ENCOUNTER — Ambulatory Visit (INDEPENDENT_AMBULATORY_CARE_PROVIDER_SITE_OTHER): Payer: Medicare Other | Admitting: Nurse Practitioner

## 2016-03-08 ENCOUNTER — Encounter: Payer: Self-pay | Admitting: Nurse Practitioner

## 2016-03-08 VITALS — BP 128/70 | HR 73 | Ht 64.0 in | Wt 173.6 lb

## 2016-03-08 DIAGNOSIS — R5383 Other fatigue: Secondary | ICD-10-CM | POA: Diagnosis not present

## 2016-03-08 DIAGNOSIS — I10 Essential (primary) hypertension: Secondary | ICD-10-CM | POA: Diagnosis not present

## 2016-03-08 DIAGNOSIS — I259 Chronic ischemic heart disease, unspecified: Secondary | ICD-10-CM | POA: Diagnosis not present

## 2016-03-08 NOTE — Progress Notes (Signed)
CARDIOLOGY OFFICE NOTE  Date:  03/08/2016    [name] Date of Birth: November 15, 1932 Medical Record [id number]  PCP:  Rogers Blocker, MD  Cardiologist:  [facility]    Chief Complaint  Patient presents with  . Follow-up    Follow up visit for fatigue - seen for Dr. Acie Fredrickson    History of Present Illness: [name] is a 82 y.o. female who presents today for a one month check. Seen for Dr. Acie Fredrickson. She now primarily follows with me.   She has a history of DM, prior AVR and CABG x 2 per EBG back in 2014 - this was with a LIMA to the LAD and SVG to the LCX, with a pericardial Edwards tissue valve #21 mm. Did have post op atrial fib - was on amiodarone. Other issues include HLD, HTN, DM, depression and hypothyroidism. Myoview ok from 06/2013. Last echo 12/2014.   I saw her back in September of 2016 for a work in visit - she was short of breath. Labs looked ok. Blood count actually improved. Echo updated - did have elevated filling pressures but otherwise unchanged. Prosthetic AV ok. Normal EF and had grade 1 diastolic dysfunction. I wanted to put her on low dose Lasix but she did not wish to proceed due to having frequent urination when on Invokana.   Seen in November of 2016 by me and she was doing ok- not really clear as to why she had improved but she had. She had talked with Dr. Dwyane Dee and he also encouraged prn diuretic. She was agreeable to trying. Last seen back in April and she was doing well - using prn diuretics. BP had been running lower and her Plendil was cut back.   I saw her a month ago - BP still low and Plendil had been stopped altogether. She was working on losing weight. Did complain of fatigue/being tired. Asked her to continue to monitor the BP - may need to start cutting back Coreg.   Back here today. Here with her sister - they live together. She says she is feeling better. She is actually doing more. Brings me a list of BP - all taken before her AM meds  - all high. No chest pain. Breathing is good. No swelling. She is actually walking some. Still trying to work on her weight.   Past Medical History:  Diagnosis Date  . Aortic valve disorders    S/P AVR 06/2012  . Arthritis   . Asthma    AS CHILD   . Depression   . Diabetes mellitus   . Heart murmur   . Hyperlipidemia   . Hypertension   . Hypothyroidism   . Ischemic heart disease    with prior CABG x 2 in 2014 along with AVR  . Lumbago   . Shortness of breath   . Thyroid disease     Past Surgical History:  Procedure Laterality Date  . AORTIC VALVE REPLACEMENT N/A 06/25/2012   Procedure: AORTIC VALVE REPLACEMENT (AVR);  Surgeon: Grace Isaac, MD;  Location: [place] [facility] [place] [facility];  Service: Open Heart Surgery;  Laterality: N/A;  . BREAST SURGERY     LT BREAST BX BENIGN  . CORONARY ARTERY BYPASS GRAFT N/A 06/25/2012   Procedure: Coronary artery bypass graft  times two using left internal mammary artery and right leg greater saphenous vein;  Surgeon: Grace Isaac, MD;  Location: [facility];  Service: Open Heart Surgery;  Laterality:  N/A;  . INTRAOPERATIVE TRANSESOPHAGEAL ECHOCARDIOGRAM N/A 06/25/2012   Procedure: INTRAOPERATIVE TRANSESOPHAGEAL ECHOCARDIOGRAM;  Surgeon: Grace Isaac, MD;  Location: [facility];  Service: Open Heart Surgery;  Laterality: N/A;  . TUBAL LIGATION       Medications: Current Outpatient Prescriptions  Medication Sig Dispense Refill  . aspirin EC 81 MG tablet Take 1 tablet (81 mg total) by mouth daily.    . B-D UF III MINI PEN NEEDLES 31G X 5 MM MISC USE 3 PER DAY 100 each 2  . benazepril (LOTENSIN) 20 MG tablet Take by mouth daily. Take 1/2 tablet by mouth daily    . calcium carbonate (TUMS) 500 MG chewable tablet Chew 1 tablet by mouth daily as needed for heartburn (indigestion).    . Canagliflozin-Metformin HCl (INVOKAMET) 50-1000 MG TABS Take 1 tablet by mouth 2 (two) times daily. 180 tablet 1  . carvedilol (COREG) 12.5 MG tablet TAKE 1 TABLET (12.5 MG TOTAL) BY  MOUTH 2 (TWO) TIMES DAILY. 60 tablet 9  . cholecalciferol (VITAMIN D) 1000 UNITS tablet Take 1,000 Units by mouth daily.    . Coenzyme Q10 (CO Q-10) 100 MG CAPS Take 1 capsule by mouth 2 (two) times daily.    . furosemide (LASIX) 20 MG tablet Take 1 tablet (20 mg total) by mouth daily as needed for fluid or edema (or weight gain). 30 tablet 6  . glucose blood (ONETOUCH VERIO) test strip Use to test blood sugar 3 times daily 150 each 5  . Insulin Detemir (LEVEMIR FLEXTOUCH) 100 UNIT/ML Pen INJECT 22 UNITS INTO THE SKIN DAILY BEFORE BREAKFAST. (Patient taking differently: INJECT 11 UNITS INTO THE SKIN TWICE A DAY) 15 mL 5  . insulin lispro (HUMALOG) 100 UNIT/ML KiwkPen Inject 0.06-0.08 mLs (6-8 Units total) into the skin 2 (two) times daily. 15 mL 3  . levothyroxine (SYNTHROID, LEVOTHROID) 75 MCG tablet TAKE 1 TABLET BY MOUTH DAILY 30 tablet 5  . metFORMIN (GLUCOPHAGE-XR) 750 MG 24 hr tablet Take 2 tablets daily 180 tablet 1  . Multiple Vitamins-Minerals (MULTIVITAMINS THER. W/MINERALS) TABS Take 1 tablet by mouth daily.    . rosuvastatin (CRESTOR) 20 MG tablet TAKE 1 TABLET (20 MG TOTAL) BY MOUTH DAILY. 30 tablet 3   No current facility-administered medications for this visit.     Allergies: No Known Allergies  Social History: The patient  reports that she has never smoked. She has never used smokeless tobacco. She reports that she does not drink alcohol or use drugs.   Family History: The patient's family history includes Heart disease in her father.   Review of Systems: Please see the history of present illness.   Otherwise, the review of systems is positive for none.   All other systems are reviewed and negative.   Physical Exam: VS:  BP 128/70   Pulse 73   Ht 5\' 4"  (1.626 m)   Wt 173 lb 9.6 oz (78.7 kg)   SpO2 100% Comment: at rest  BMI 29.80 kg/m  .  BMI Body mass index is 29.8 kg/m.  Wt Readings from Last 3 Encounters:  03/08/16 173 lb 9.6 oz (78.7 kg)  02/08/16 172 lb  12.8 oz (78.4 kg)  02/07/16 172 lb 9.6 oz (78.3 kg)   BP by me is 120/70.  General: Pleasant. Well developed, well nourished and in no acute distress.   HEENT: Normal.  Neck: Supple, no JVD, carotid bruits, or masses noted.  Cardiac: Regular rate and rhythm. No murmurs, rubs, or gallops. No edema.  Respiratory:  Lungs are clear to auscultation bilaterally with normal work of breathing.  GI: Soft and nontender.  MS: No deformity or atrophy. Gait and ROM intact.  Skin: Warm and dry. Color is normal.  Neuro:  Strength and sensation are intact and no gross focal deficits noted.  Psych: Alert, appropriate and with normal affect.   LABORATORY DATA:  EKG:  EKG is not ordered today.  Lab Results  Component Value Date   WBC 5.1 02/08/2016   HGB 11.3 (L) 02/08/2016   HCT 34.1 (L) 02/08/2016   PLT 232 02/08/2016   GLUCOSE 145 (H) 02/02/2016   CHOL 134 08/02/2015   TRIG 83.0 08/02/2015   HDL 48.30 08/02/2015   LDLDIRECT 102.8 11/28/2013   LDLCALC 69 08/02/2015   ALT 11 02/02/2016   AST 17 02/02/2016   NA 138 02/02/2016   K 3.6 02/02/2016   CL 101 02/02/2016   CREATININE 1.28 (H) 02/02/2016   BUN 29 (H) 02/02/2016   CO2 29 02/02/2016   TSH 1.74 02/02/2016   INR 1.57 (H) 06/25/2012   HGBA1C 8.0 (H) 02/02/2016   MICROALBUR 10.1 (H) 08/02/2015    BNP (last 3 results) No results for input(s): BNP in the last 8760 hours.  ProBNP (last 3 results) No results for input(s): PROBNP in the last 8760 hours.   Other Studies Reviewed Today:  Echo Study Conclusions from 12/2014  - Left ventricle: The cavity size was normal. There was mild focal basal hypertrophy of the septum. Systolic function was normal. The estimated ejection fraction was in the range of 55% to 60%. Wall motion was normal; there were no regional wall motion abnormalities. There was an increased relative contribution of atrial contraction to ventricular filling. Doppler parameters are consistent with  abnormal left ventricular relaxation (grade 1 diastolic dysfunction). Doppler parameters are consistent with high ventricular filling pressure. - Aortic valve: Poorly visualized. A bioprosthesis was present and functioning normally. Peak velocity (S): 248 cm/s. Mean gradient (S): 12 mm Hg. Valve area (VTI): 0.94 cm^2. Valve area (Vmax): 0.97 cm^2. Valve area (Vmean): 0.95 cm^2. - Mitral valve: Calcified annulus. Mild diffuse thickening. There was mild regurgitation. - Left atrium: The atrium was mildly dilated. - Pulmonic valve: There was trivial regurgitation.   Myoview Impression from 06/2013 Exercise Capacity: Lexiscan with no exercise. BP Response: Normal blood pressure response. Clinical Symptoms: No significant symptoms noted. ECG Impression: There are scattered PVCs. Comparison with Prior Nuclear Study: No previous nuclear study performed  Overall Impression: Low risk stress nuclear study small fixed distal anterior artifact which could be breast attenuation or other artifact.  LV Ejection Fraction: 66%. LV Wall Motion: Normal Wall Motion  [name], MD, [facility]    Assessment/Plan:  1. CAD - s/p CABG 06-27-12 -last Myoview stable from 2015. No active chest pain.Would continue with CV risk factor modification.   2. Aortic stenosis with prior AVR - echo from 2016 was ok. Did have elevated filling pressures - Now using low dose Lasix prn. She has improved clinically.   3. Post op atrial fibrillation - remains in NSR by exam   4. HTN - No longer on her Plendil - have asked her to continue to monitor at home - will leave her on her current regimen for now.   5. Obesity- has been trying to intentionally lose weight.   6. Fatigue - most likely multifactorial - but improved.    Current medicines are reviewed with the patient today.  The patient does not have  concerns regarding medicines other than what has been noted above.  The  following changes have been made:  See above.  Labs/ tests ordered today include:   No orders of the defined types were placed in this encounter.    Disposition:   FU with me in 4 months.   Patient is agreeable to this plan and will call if any problems develop in the interim.   Signed: Burtis Junes, RN, ANP-C 03/08/2016 9:36 AM  [facility] [address] [place], [place]  [address] Phone: [phone] Fax: [phone]

## 2016-03-08 NOTE — Patient Instructions (Addendum)
We will be checking the following labs today - NONE   Medication Instructions:    Continue with your current medicines.     Testing/Procedures To Be Arranged:  N/A  Follow-Up:   See me in about 4 months    Other Special Instructions:   Keep up with the walking!  Keep monitoring your blood pressure - check at different times of the day  Bring your BP cuff with you to your next visit    If you need a refill on your cardiac medications before your next appointment, please call your pharmacy.   Call the [facility] office at [phone] if you have any questions, problems or concerns.

## 2016-03-28 ENCOUNTER — Other Ambulatory Visit: Payer: Self-pay | Admitting: Endocrinology

## 2016-04-03 DIAGNOSIS — E113311 Type 2 diabetes mellitus with moderate nonproliferative diabetic retinopathy with macular edema, right eye: Secondary | ICD-10-CM | POA: Diagnosis not present

## 2016-04-03 DIAGNOSIS — E113392 Type 2 diabetes mellitus with moderate nonproliferative diabetic retinopathy without macular edema, left eye: Secondary | ICD-10-CM | POA: Diagnosis not present

## 2016-04-04 ENCOUNTER — Other Ambulatory Visit (INDEPENDENT_AMBULATORY_CARE_PROVIDER_SITE_OTHER): Payer: Medicare Other

## 2016-04-04 DIAGNOSIS — E1165 Type 2 diabetes mellitus with hyperglycemia: Secondary | ICD-10-CM

## 2016-04-04 DIAGNOSIS — Z794 Long term (current) use of insulin: Secondary | ICD-10-CM | POA: Diagnosis not present

## 2016-04-04 LAB — LIPID PANEL
CHOL/HDL RATIO: 3
Cholesterol: 123 mg/dL (ref 0–200)
HDL: 48.9 mg/dL (ref >39.00)
LDL CALC: 64 mg/dL (ref 0–99)
NonHDL: 74.35
Triglycerides: 54 mg/dL (ref 0.0–149.0)
VLDL: 10.8 mg/dL (ref 0.0–40.0)

## 2016-04-04 LAB — BASIC METABOLIC PANEL
BUN: 17 mg/dL (ref 6–23)
CO2: 29 mEq/L (ref 19–32)
CREATININE: 1.07 mg/dL (ref 0.40–1.20)
Calcium: 9.7 mg/dL (ref 8.4–10.5)
Chloride: 104 mEq/L (ref 96–112)
GFR: 63.29 mL/min (ref >60.00)
Glucose, Bld: 146 mg/dL — ABNORMAL HIGH (ref 70–99)
Potassium: 3.8 mEq/L (ref 3.5–5.1)
Sodium: 140 mEq/L (ref 135–145)

## 2016-04-04 LAB — MICROALBUMIN / CREATININE URINE RATIO
Creatinine,U: 86.7 mg/dL
MICROALB UR: 48.8 mg/dL — AB (ref 0.0–1.9)
Microalb Creat Ratio: 56.3 mg/g — ABNORMAL HIGH (ref 0.0–30.0)

## 2016-04-07 ENCOUNTER — Ambulatory Visit (INDEPENDENT_AMBULATORY_CARE_PROVIDER_SITE_OTHER): Payer: Medicare Other | Admitting: Endocrinology

## 2016-04-07 VITALS — BP 140/80 | HR 68 | Wt 164.0 lb

## 2016-04-07 DIAGNOSIS — I10 Essential (primary) hypertension: Secondary | ICD-10-CM | POA: Diagnosis not present

## 2016-04-07 DIAGNOSIS — I259 Chronic ischemic heart disease, unspecified: Secondary | ICD-10-CM | POA: Diagnosis not present

## 2016-04-07 DIAGNOSIS — E038 Other specified hypothyroidism: Secondary | ICD-10-CM | POA: Diagnosis not present

## 2016-04-07 DIAGNOSIS — E1165 Type 2 diabetes mellitus with hyperglycemia: Secondary | ICD-10-CM

## 2016-04-07 DIAGNOSIS — Z794 Long term (current) use of insulin: Secondary | ICD-10-CM

## 2016-04-07 DIAGNOSIS — E063 Autoimmune thyroiditis: Secondary | ICD-10-CM

## 2016-04-07 NOTE — Patient Instructions (Addendum)
Toujeo 26 in am daily, stop Levemir  Humalog 8 at lunch and none at supper unless eating carbs or sugar >300

## 2016-04-07 NOTE — Progress Notes (Signed)
[name] 82 y.o.            Reason for Appointment: Diabetes follow-up   History of Present Illness   Diagnosis: Type 2 DIABETES MELITUS, date of diagnosis:   2000   She has been on insulin since 2004 with difficulty controlling adequately because of compliance with diet and variability in blood sugars. Her previous records are not available at present, however she has been on basal insulin with mealtime coverage since at least 2007 She has been tried on Byetta previously but did not tolerate this well She tried Victoza but didn't not benefit from Victoza 0.6 dosage; had nausea from 1.2 mg; did not continue this because of this side effect She also was given a trial of the V-go pump but she subjectively did not like this  RECENT history:    INSULIN: Levemir 22 units in morning, HUMALOG 6-8 acb, 4-6  lunch and 0-4 at supper    She was told to split her Levemir and take it twice a day but she is again taking only one injection   A1c is usually around 8%  She has the following blood sugar patterns and problems identified  She is recently having higher fasting readings  She says because of cost she is taking only half the Invokamet tablet.  Also appears to have markedly increased blood sugars late afternoon at times  She thinks she is eating a very low carbohydrate meal at suppertime and even with this she is taking 4 units of Humalog, has had hypoglycemia once because of this blood glucose 43  Occasionally will miss her Humalog at lunch  Currently though her lunch is fairly large she is taking only 6 units of Humalog  Also concerned about the cost of insulin  Oral hypoglycemic drugs: Metformin 750 mg at supper, Invokamet 50/1000 1/2 at 9 am     .         Monitors blood glucose:  about 2-3 times daily     Glucometer: One Touch.          Blood Glucose readings from download:  Mean values apply above for all meters except median for One Touch  PRE-MEAL Fasting Lunch  Dinner Bedtime Overall  Glucose range: 109-274     43-532   Mean/median: 169  183   131  173    POST-MEAL PC Breakfast PC Lunch PC Dinner  Glucose range:  162-310    Mean/median:  207      Meals: 3 meals per day:  breakfast is mostly peanut butter or cheese crackers 7 am;  lunch 12-2 pm, dinner 5 pm Dinner low carb Physical activity: exercise: walks upto 30 min; 1-3/7  days  Wt Readings from Last 3 Encounters:  04/07/16 164 lb (74.4 kg)  03/08/16 173 lb 9.6 oz (78.7 kg)  02/08/16 172 lb 12.8 oz (78.4 kg)    Lab Results  Component Value Date   HGBA1C 8.0 (H) 02/02/2016   HGBA1C 7.9 (H) 10/22/2015   HGBA1C 8.0 (H) 08/02/2015   Lab Results  Component Value Date   MICROALBUR 48.8 (H) 04/04/2016   LDLCALC 64 04/04/2016   CREATININE 1.07 04/04/2016    Lab on 04/04/2016  Component Date Value Ref Range Status  . Sodium 04/04/2016 140  135 - 145 mEq/L Final  . Potassium 04/04/2016 3.8  3.5 - 5.1 mEq/L Final  . Chloride 04/04/2016 104  96 - 112 mEq/L Final  . CO2 04/04/2016 29  19 - 32  mEq/L Final  . Glucose, Bld 04/04/2016 146* 70 - 99 mg/dL Final  . BUN 04/04/2016 17  6 - 23 mg/dL Final  . Creatinine, Ser 04/04/2016 1.07  0.40 - 1.20 mg/dL Final  . Calcium 04/04/2016 9.7  8.4 - 10.5 mg/dL Final  . GFR 04/04/2016 63.29  >60.00 mL/min Final  . Microalb, Ur 04/04/2016 48.8* 0.0 - 1.9 mg/dL Final  . Creatinine,U 04/04/2016 86.7  mg/dL Final  . Microalb Creat Ratio 04/04/2016 56.3* 0.0 - 30.0 mg/g Final  . Cholesterol 04/04/2016 123  0 - 200 mg/dL Final  . Triglycerides 04/04/2016 54.0  0.0 - 149.0 mg/dL Final  . HDL 04/04/2016 48.90  >39.00 mg/dL Final  . VLDL 04/04/2016 10.8  0.0 - 40.0 mg/dL Final  . LDL Cholesterol 04/04/2016 64  0 - 99 mg/dL Final  . Total CHOL/HDL Ratio 04/04/2016 3   Final  . NonHDL 04/04/2016 74.35   Final       Medication List       Accurate as of 04/07/16  5:32 PM. Always use your most recent med list.          aspirin EC 81 MG  tablet Take 1 tablet (81 mg total) by mouth daily.   B-D UF III MINI PEN NEEDLES 31G X 5 MM Misc Generic drug:  Insulin Pen Needle USE 3 PER DAY   benazepril 20 MG tablet Commonly known as:  LOTENSIN Take by mouth daily. Take 1/2 tablet by mouth daily   Canagliflozin-Metformin HCl 50-1000 MG Tabs Commonly known as:  INVOKAMET Take 1 tablet by mouth 2 (two) times daily.   carvedilol 12.5 MG tablet Commonly known as:  COREG TAKE 1 TABLET (12.5 MG TOTAL) BY MOUTH 2 (TWO) TIMES DAILY.   cholecalciferol 1000 units tablet Commonly known as:  VITAMIN D Take 1,000 Units by mouth daily.   Co Q-10 100 MG Caps Take 1 capsule by mouth 2 (two) times daily.   furosemide 20 MG tablet Commonly known as:  LASIX Take 1 tablet (20 mg total) by mouth daily as needed for fluid or edema (or weight gain).   glucose blood test strip Commonly known as:  ONETOUCH VERIO Use to test blood sugar 3 times daily   Insulin Detemir 100 UNIT/ML Pen Commonly known as:  LEVEMIR FLEXTOUCH INJECT 22 UNITS INTO THE SKIN DAILY BEFORE BREAKFAST.   insulin lispro 100 UNIT/ML KiwkPen Commonly known as:  HUMALOG Inject 0.06-0.08 mLs (6-8 Units total) into the skin 2 (two) times daily.   levothyroxine 75 MCG tablet Commonly known as:  SYNTHROID, LEVOTHROID TAKE 1 TABLET BY MOUTH DAILY   metFORMIN 750 MG 24 hr tablet Commonly known as:  GLUCOPHAGE-XR Take 2 tablets daily   multivitamins ther. w/minerals Tabs tablet Take 1 tablet by mouth daily.   rosuvastatin 20 MG tablet Commonly known as:  CRESTOR TAKE 1 TABLET (20 MG TOTAL) BY MOUTH DAILY.   TUMS 500 MG chewable tablet Generic drug:  calcium carbonate Chew 1 tablet by mouth daily as needed for heartburn (indigestion).       Allergies: No Known Allergies  Past Medical History:  Diagnosis Date  . Aortic valve disorders    S/P AVR 06/2012  . Arthritis   . Asthma    AS CHILD   . Depression   . Diabetes mellitus   . Heart murmur   .  Hyperlipidemia   . Hypertension   . Hypothyroidism   . Ischemic heart disease    with prior CABG x  2 in 2014 along with AVR  . Lumbago   . Shortness of breath   . Thyroid disease     Past Surgical History:  Procedure Laterality Date  . AORTIC VALVE REPLACEMENT N/A 06/25/2012   Procedure: AORTIC VALVE REPLACEMENT (AVR);  Surgeon: Grace Isaac, MD;  Location: [facility];  Service: Open Heart Surgery;  Laterality: N/A;  . BREAST SURGERY     LT BREAST BX BENIGN  . CORONARY ARTERY BYPASS GRAFT N/A 06/25/2012   Procedure: Coronary artery bypass graft  times two using left internal mammary artery and right leg greater saphenous vein;  Surgeon: Grace Isaac, MD;  Location: [place] [facility];  Service: Open Heart Surgery;  Laterality: N/A;  . INTRAOPERATIVE TRANSESOPHAGEAL ECHOCARDIOGRAM N/A 06/25/2012   Procedure: INTRAOPERATIVE TRANSESOPHAGEAL ECHOCARDIOGRAM;  Surgeon: Grace Isaac, MD;  Location: [facility];  Service: Open Heart Surgery;  Laterality: N/A;  . TUBAL LIGATION      Family History  Problem Relation Age of Onset  . Heart disease Father     Social History:  reports that she has never smoked. She has never used smokeless tobacco. She reports that she does not drink alcohol or use drugs.  Review of Systems:   HYPERTENSION: taking multiple drugs.  Blood pressure is well-controlled, followed by PCP She was told that her dose Lotensin to half tablet on her last visit because of relatively higher creatinine and low normal blood pressure However blood pressure appears to be  high normal today  BP Readings from Last 3 Encounters:  04/07/16 140/80  03/08/16 128/70  02/09/16 (!) 154/66       HYPOTHYROIDISM: She has had long-standing hypothyroidism  She has had consistent TSH level with current 75 g dose   Lab Results  Component Value Date   TSH 1.74 02/02/2016   TSH 1.33 10/22/2015   TSH 1.06 06/02/2015   FREET4 1.13 06/02/2015   FREET4 1.07 03/11/2014   FREET4 1.01  09/25/2013    HYPERLIPIDEMIA: The lipid abnormality consists of elevated LDL; has been  controlled with Crestor 20 mg LDL  below 100     Lab Results  Component Value Date   CHOL 123 04/04/2016   HDL 48.90 04/04/2016   LDLCALC 64 04/04/2016   LDLDIRECT 102.8 11/28/2013   TRIG 54.0 04/04/2016   CHOLHDL 3 04/04/2016  '    She does see a podiatrist regularly for toenail trimming  Last foot exam was in 12/16: Diabetic foot exam shows normal monofilament sensation in the toes and plantar surfaces, no skin lesions or ulcers on the feet and normal pedal pulses    Examination:   BP 140/80 (Cuff Size: Normal)   Pulse 68   Wt 164 lb (74.4 kg)   SpO2 98%   BMI 28.15 kg/m   Body mass index is 28.15 kg/m.    ASSESSMENT/ PLAN:   Diabetes type 2, uncontrolled   See history of present illness for a discussion of the blood sugar patterns, recent management and problems identified  Blood sugars are still poorly controlled This is likely to be from her not getting enough basal insulin as well as reducing Invokana, stress eating and not taking enough insulin to cover her lunch with some skipped doses  Highest blood sugars are midday and afternoon She is not getting enough coverage for her breakfast and lunch but does have variability based on her diet Also with Levemir once a day she is getting higher fasting readings and does not follow instructions for  taking this twice a day   Recommendations:  Switch Levemir to Toujeo for better control, also more likely this will work better 24 hours  Discussed how Toujeo is different and she will start with 26 units once a day.  She will adjust the dose based on fasting readings if needed and call if blood sugars are getting low  Most likely she does not need any coverage for her evening meal which is generally with very low carbohydrate.  She needs to be better compliant with taking her insulin lunchtime  Increase Humalog to 8 units at  lunch since most of her sugars are high in the afternoon  She can finish her Levemir before switching  More consistent coverage of higher fat or higher carbohydrate meals with 6-8 units of Humalog  More consistent exercise    HYPERTENSION: controlled, Probably high normal with her reducing her Invokana and Lotensin Creatinine was relatively higher with higher doses of benazepril so we will hold off on increasing that since most likely she can take the full dose of Invokana starting in January   Hypothyroidism: To be rechecked next visit   Patient Instructions  Toujeo 26 in am daily, stop Levemir  Humalog 8 at lunch and none at supper unless eating carbs or sugar >300            Counseling time on subjects discussed above is over 50% of today's 25 minute visit   Yariela Tison 04/07/2016, 5:32 PM   Note: This office note was prepared with Dragon voice recognition system technology. Any transcriptional errors that result from this process are unintentional.

## 2016-04-17 DIAGNOSIS — E113311 Type 2 diabetes mellitus with moderate nonproliferative diabetic retinopathy with macular edema, right eye: Secondary | ICD-10-CM | POA: Diagnosis not present

## 2016-04-24 ENCOUNTER — Other Ambulatory Visit: Payer: Self-pay | Admitting: Cardiovascular Disease

## 2016-04-25 ENCOUNTER — Other Ambulatory Visit: Payer: Self-pay | Admitting: Endocrinology

## 2016-05-10 ENCOUNTER — Ambulatory Visit: Payer: Medicare Other | Admitting: Podiatry

## 2016-05-19 ENCOUNTER — Telehealth: Payer: Self-pay | Admitting: Endocrinology

## 2016-05-19 ENCOUNTER — Other Ambulatory Visit: Payer: Medicare Other

## 2016-05-19 NOTE — Telephone Encounter (Signed)
Patient need ok from Dr Dwyane Dee stating she test 4 times a day before she can get her diabetic supplies, company  [employer] [phone]

## 2016-05-22 ENCOUNTER — Encounter: Payer: Self-pay | Admitting: Endocrinology

## 2016-05-22 ENCOUNTER — Ambulatory Visit (INDEPENDENT_AMBULATORY_CARE_PROVIDER_SITE_OTHER): Payer: Medicare Other | Admitting: Endocrinology

## 2016-05-22 VITALS — BP 140/80 | HR 74 | Ht 63.0 in | Wt 175.0 lb

## 2016-05-22 DIAGNOSIS — E1165 Type 2 diabetes mellitus with hyperglycemia: Secondary | ICD-10-CM

## 2016-05-22 DIAGNOSIS — Z794 Long term (current) use of insulin: Secondary | ICD-10-CM | POA: Diagnosis not present

## 2016-05-22 NOTE — Progress Notes (Signed)
[name] 83 y.o.            Reason for Appointment: Diabetes follow-up   History of Present Illness   Diagnosis: Type 2 DIABETES MELITUS, date of diagnosis:   2000   She has been on insulin since 2004 with difficulty controlling adequately because of compliance with diet and variability in blood sugars. Her previous records are not available at present, however she has been on basal insulin with mealtime coverage since at least 2007 She has been tried on Byetta previously but did not tolerate this well She tried Victoza but didn't not benefit from Victoza 0.6 dosage; had nausea from 1.2 mg; did not continue this because of this side effect She also was given a trial of the V-go pump but she subjectively did not like this  RECENT history:    INSULIN: Levemir 22 units in morning, HUMALOG 6-8 acb, 6  lunch and 0-4 at supper    She was told to change her Levemir to [facility] but she has not done so   A1c is usually around 8%, last level was in 10/17  She has the following blood sugar patterns and problems identified  She is recently having overall much higher  readings  She says because of fear of side effects she has stopped taking her Invokamet  With this she did not increase her metformin or insulin  She is still taking about the same amount of insulin as before but blood sugars are high throughout the day with the highest readings in the afternoons including some readings as high as 496  She did not call to report high blood sugars or increase her mealtime doses even when she is having high readings  She thinks she is better compliant with her mealtime doses at lunch but not always  Currently is avoiding drinks with sugar  Oral hypoglycemic drugs: Metformin 750 mg at supper, Invokamet 50/1000: Not taking   .         Monitors blood glucose:  about 2-3 times daily     Glucometer: One Touch.          Blood Glucose readings from download:  Mean values apply above for all  meters except median for One Touch  PRE-MEAL Fasting Lunch Dinner Bedtime Overall  Glucose range: 105-213  67-344  88-496  58-229    Mean/median: 184 258  235   225    Meals: 3 meals per day:  breakfast is mostly peanut butter or cheese crackers 7 am;  lunch 12-2 pm, dinner 5 pm Dinner low carb Physical activity: exercise: walks upto 30 min; 1-3/7  days  Wt Readings from Last 3 Encounters:  05/22/16 175 lb (79.4 kg)  04/07/16 164 lb (74.4 kg)  03/08/16 173 lb 9.6 oz (78.7 kg)    Lab Results  Component Value Date   HGBA1C 8.0 (H) 02/02/2016   HGBA1C 7.9 (H) 10/22/2015   HGBA1C 8.0 (H) 08/02/2015   Lab Results  Component Value Date   MICROALBUR 48.8 (H) 04/04/2016   LDLCALC 64 04/04/2016   CREATININE 1.07 04/04/2016    No visits with results within 1 Week(s) from this visit.  Latest known visit with results is:  Lab on 04/04/2016  Component Date Value Ref Range Status  . Sodium 04/04/2016 140  135 - 145 mEq/L Final  . Potassium 04/04/2016 3.8  3.5 - 5.1 mEq/L Final  . Chloride 04/04/2016 104  96 - 112 mEq/L Final  . CO2 04/04/2016 29  19 - 32 mEq/L Final  . Glucose, Bld 04/04/2016 146* 70 - 99 mg/dL Final  . BUN 04/04/2016 17  6 - 23 mg/dL Final  . Creatinine, Ser 04/04/2016 1.07  0.40 - 1.20 mg/dL Final  . Calcium 04/04/2016 9.7  8.4 - 10.5 mg/dL Final  . GFR 04/04/2016 63.29  >60.00 mL/min Final  . Microalb, Ur 04/04/2016 48.8* 0.0 - 1.9 mg/dL Final  . Creatinine,U 04/04/2016 86.7  mg/dL Final  . Microalb Creat Ratio 04/04/2016 56.3* 0.0 - 30.0 mg/g Final  . Cholesterol 04/04/2016 123  0 - 200 mg/dL Final  . Triglycerides 04/04/2016 54.0  0.0 - 149.0 mg/dL Final  . HDL 04/04/2016 48.90  >39.00 mg/dL Final  . VLDL 04/04/2016 10.8  0.0 - 40.0 mg/dL Final  . LDL Cholesterol 04/04/2016 64  0 - 99 mg/dL Final  . Total CHOL/HDL Ratio 04/04/2016 3   Final  . NonHDL 04/04/2016 74.35   Final     Allergies as of 05/22/2016   No Known Allergies     Medication List         Accurate as of 05/22/16  1:07 PM. Always use your most recent med list.          aspirin EC 81 MG tablet Take 1 tablet (81 mg total) by mouth daily.   B-D UF III MINI PEN NEEDLES 31G X 5 MM Misc Generic drug:  Insulin Pen Needle USE 3 PER DAY   benazepril 20 MG tablet Commonly known as:  LOTENSIN Take by mouth daily. Take 1/2 tablet by mouth daily   Canagliflozin-Metformin HCl 50-1000 MG Tabs Commonly known as:  INVOKAMET Take 1 tablet by mouth 2 (two) times daily.   carvedilol 12.5 MG tablet Commonly known as:  COREG TAKE 1 TABLET (12.5 MG TOTAL) BY MOUTH 2 (TWO) TIMES DAILY.   cholecalciferol 1000 units tablet Commonly known as:  VITAMIN D Take 1,000 Units by mouth daily.   Co Q-10 100 MG Caps Take 1 capsule by mouth 2 (two) times daily.   furosemide 20 MG tablet Commonly known as:  LASIX Take 1 tablet (20 mg total) by mouth daily as needed for fluid or edema (or weight gain).   glucose blood test strip Commonly known as:  ONETOUCH VERIO Use to test blood sugar 3 times daily   Insulin Detemir 100 UNIT/ML Pen Commonly known as:  LEVEMIR FLEXTOUCH INJECT 22 UNITS INTO THE SKIN DAILY BEFORE BREAKFAST.   insulin lispro 100 UNIT/ML KiwkPen Commonly known as:  HUMALOG Inject 0.06-0.08 mLs (6-8 Units total) into the skin 2 (two) times daily.   levothyroxine 75 MCG tablet Commonly known as:  SYNTHROID, LEVOTHROID TAKE 1 TABLET BY MOUTH DAILY   metFORMIN 750 MG 24 hr tablet Commonly known as:  GLUCOPHAGE-XR Take 2 tablets daily   multivitamins ther. w/minerals Tabs tablet Take 1 tablet by mouth daily.   rosuvastatin 20 MG tablet Commonly known as:  CRESTOR TAKE 1 TABLET(20 MG) BY MOUTH DAILY   TUMS 500 MG chewable tablet Generic drug:  calcium carbonate Chew 1 tablet by mouth daily as needed for heartburn (indigestion).       Allergies: No Known Allergies  Past Medical History:  Diagnosis Date  . Aortic valve disorders    S/P AVR 06/2012  .  Arthritis   . Asthma    AS CHILD   . Depression   . Diabetes mellitus   . Heart murmur   . Hyperlipidemia   . Hypertension   . Hypothyroidism   .  Ischemic heart disease    with prior CABG x 2 in 2014 along with AVR  . Lumbago   . Shortness of breath   . Thyroid disease     Past Surgical History:  Procedure Laterality Date  . AORTIC VALVE REPLACEMENT N/A 06/25/2012   Procedure: AORTIC VALVE REPLACEMENT (AVR);  Surgeon: Grace Isaac, MD;  Location: [place];  Service: Open Heart Surgery;  Laterality: N/A;  . BREAST SURGERY     LT BREAST BX BENIGN  . CORONARY ARTERY BYPASS GRAFT N/A 06/25/2012   Procedure: Coronary artery bypass graft  times two using left internal mammary artery and right leg greater saphenous vein;  Surgeon: Grace Isaac, MD;  Location: [place];  Service: Open Heart Surgery;  Laterality: N/A;  . INTRAOPERATIVE TRANSESOPHAGEAL ECHOCARDIOGRAM N/A 06/25/2012   Procedure: INTRAOPERATIVE TRANSESOPHAGEAL ECHOCARDIOGRAM;  Surgeon: Grace Isaac, MD;  Location: [place];  Service: Open Heart Surgery;  Laterality: N/A;  . TUBAL LIGATION      Family History  Problem Relation Age of Onset  . Heart disease Father     Social History:  reports that she has never smoked. She has never used smokeless tobacco. She reports that she does not drink alcohol or use drugs.  Review of Systems:   HYPERTENSION: Blood pressure is well-controlled, followed by PCP May have had better readings previously with taking Invokana also  BP Readings from Last 3 Encounters:  05/22/16 140/80  04/07/16 140/80  03/08/16 128/70       HYPOTHYROIDISM: She has had long-standing hypothyroidism  She has had consistent TSH level with current 75 g dose   Lab Results  Component Value Date   TSH 1.74 02/02/2016   TSH 1.33 10/22/2015   TSH 1.06 06/02/2015   FREET4 1.13 06/02/2015   FREET4 1.07 03/11/2014   FREET4 1.01 09/25/2013    HYPERLIPIDEMIA: The lipid abnormality consists of  elevated LDL; has been  controlled with Crestor 20 mg LDL as below      Lab Results  Component Value Date   CHOL 123 04/04/2016   HDL 48.90 04/04/2016   LDLCALC 64 04/04/2016   LDLDIRECT 102.8 11/28/2013   TRIG 54.0 04/04/2016   CHOLHDL 3 04/04/2016  '    She does see a podiatrist regularly for toenail trimming  Last foot exam was in 12/16: Diabetic foot exam shows normal monofilament sensation in the toes and plantar surfaces, no skin lesions or ulcers on the feet and normal pedal pulses    Examination:   BP 140/80   Pulse 74   Ht 5\' 3"  (1.6 m)   Wt 175 lb (79.4 kg)   BMI 31.00 kg/m   Body mass index is 31 kg/m.    ASSESSMENT/ PLAN:   Diabetes type 2, uncontrolled   See history of present illness for a discussion of the blood sugar patterns, recent management and problems identified  Blood sugars are poorly controlled With her not taking her Invokamet and taking inadequate doses of insulin She is not concerned about blood sugars being higher and did not call or try to increase at least her mealtime insulin Blood sugars are averaging well over 200 now She has relatively higher postprandial readings and taking relatively small amounts of mealtime insulin She continues to be reluctant to take her medications in insulin as directed Also has not switched to Levemir to Toujeo possibly because of fear of higher cost Has not taken Invokana because of TV commercials for amputations   Recommendations:  Discussed in detail the safety and studies with Invokana and benefit versus risks in her case and discussed that she has no evidence of peripheral vascular disease or neuropathic ulcers  She will increase her insulin doses at least for now  Will consider switching her Levemir to Toujeo on the next visit  Discussed blood sugar targets and adjusting mealtime doses if blood sugars are higher later in the afternoon  Regular exercise with walking   HYPERTENSION: controlled,  Probably will be better with restarting Invokana   Hypothyroidism: To be rechecked next visit   Patient Instructions  Restart Invokamet  May use 8 units at lunch  Use 24 Levemir    Counseling time on subjects discussed above is over 50% of today's 25 minute visit   Cataleah Stites 05/22/2016, 1:07 PM   Note: This office note was prepared with Estate agent. Any transcriptional errors that result from this process are unintentional.

## 2016-05-22 NOTE — Telephone Encounter (Signed)
Patient is call on the status of her diabetic supply's, she is very upset. Please call her today she asked.

## 2016-05-22 NOTE — Patient Instructions (Signed)
Restart Invokamet  May use 8 units at lunch  Use 24 Levemir

## 2016-05-23 NOTE — Telephone Encounter (Signed)
Dr. Dwyane Dee has this paper work will fax as soon as I get it back

## 2016-05-24 ENCOUNTER — Ambulatory Visit (INDEPENDENT_AMBULATORY_CARE_PROVIDER_SITE_OTHER): Payer: Medicare Other | Admitting: Podiatry

## 2016-05-24 ENCOUNTER — Encounter: Payer: Self-pay | Admitting: Podiatry

## 2016-05-24 VITALS — BP 151/71 | HR 72 | Resp 18

## 2016-05-24 DIAGNOSIS — B351 Tinea unguium: Secondary | ICD-10-CM | POA: Diagnosis not present

## 2016-05-24 DIAGNOSIS — E119 Type 2 diabetes mellitus without complications: Secondary | ICD-10-CM

## 2016-05-24 DIAGNOSIS — M79676 Pain in unspecified toe(s): Secondary | ICD-10-CM | POA: Diagnosis not present

## 2016-05-24 DIAGNOSIS — L84 Corns and callosities: Secondary | ICD-10-CM

## 2016-05-24 NOTE — Progress Notes (Signed)
Patient ID: [name], female   DOB: [birth year], 83 y.o.   MRN: [id number]   Subjective: This patient presents for scheduled visit complaining of painful toenails when walking wearing shoes and requests nail debridement  Objective: DP pulses 2/4 bilaterally PT pulses 1/4 bilaterally Capillary reflex immediate bilaterally Sensation to 10 g monofilament wire intact 4/5 right and 5/5 left Vibratory sensation reactive bilaterally HAV left Ankle reflexes equal and reactive bilaterally No open skin lesions bilaterally Hammertoe second bilaterally Plantar callus subsecond MPJ right The toenails are elongated, brittle, discolored, incurvated and tender to direct palpation 6-10 Corn second and fifth right toes and fifth left  Assessment: Diabetic type II without foot complication Symptomatic onychomycoses 6-10 keratoses 4 Plan: Debrided toenails 10 and mechanically and electrically without any bleeding Debride keratoses 4 without any bleeding  Reappoint 3 months

## 2016-05-24 NOTE — Patient Instructions (Signed)

## 2016-05-25 DIAGNOSIS — H43813 Vitreous degeneration, bilateral: Secondary | ICD-10-CM | POA: Diagnosis not present

## 2016-05-25 DIAGNOSIS — E113313 Type 2 diabetes mellitus with moderate nonproliferative diabetic retinopathy with macular edema, bilateral: Secondary | ICD-10-CM | POA: Diagnosis not present

## 2016-06-01 DIAGNOSIS — I10 Essential (primary) hypertension: Secondary | ICD-10-CM | POA: Diagnosis not present

## 2016-06-01 DIAGNOSIS — I350 Nonrheumatic aortic (valve) stenosis: Secondary | ICD-10-CM | POA: Diagnosis not present

## 2016-06-01 DIAGNOSIS — R0609 Other forms of dyspnea: Secondary | ICD-10-CM | POA: Diagnosis not present

## 2016-06-01 DIAGNOSIS — E119 Type 2 diabetes mellitus without complications: Secondary | ICD-10-CM | POA: Diagnosis not present

## 2016-06-03 ENCOUNTER — Other Ambulatory Visit: Payer: Self-pay | Admitting: Endocrinology

## 2016-06-14 DIAGNOSIS — D509 Iron deficiency anemia, unspecified: Secondary | ICD-10-CM | POA: Diagnosis not present

## 2016-06-14 DIAGNOSIS — I10 Essential (primary) hypertension: Secondary | ICD-10-CM | POA: Diagnosis not present

## 2016-06-14 DIAGNOSIS — R0609 Other forms of dyspnea: Secondary | ICD-10-CM | POA: Diagnosis not present

## 2016-06-21 ENCOUNTER — Telehealth: Payer: Self-pay | Admitting: Endocrinology

## 2016-06-21 NOTE — Telephone Encounter (Signed)
Need more specific information, CMA needs to take action

## 2016-06-21 NOTE — Telephone Encounter (Signed)
[name] will note cover any of the meds we need please advise on what the pt is to do.

## 2016-06-22 NOTE — Telephone Encounter (Signed)
Pt is not interested in [place] please disregard this note and throw away any paperwork regarding [facility] and this pt

## 2016-06-22 NOTE — Telephone Encounter (Signed)
Patient is retuning your call, need to talk to you concerning, some medication, she don't know what it is.   Please advise

## 2016-06-28 ENCOUNTER — Other Ambulatory Visit: Payer: Self-pay

## 2016-06-29 NOTE — Telephone Encounter (Signed)
Spoke with patient and she stated she does not know why we are calling she is not having an issue with her medications- will continue getting medications from the local drug store

## 2016-07-11 ENCOUNTER — Ambulatory Visit: Payer: Medicare Other | Admitting: Nurse Practitioner

## 2016-07-13 DIAGNOSIS — E113313 Type 2 diabetes mellitus with moderate nonproliferative diabetic retinopathy with macular edema, bilateral: Secondary | ICD-10-CM | POA: Diagnosis not present

## 2016-07-13 DIAGNOSIS — H43813 Vitreous degeneration, bilateral: Secondary | ICD-10-CM | POA: Diagnosis not present

## 2016-07-17 ENCOUNTER — Other Ambulatory Visit (INDEPENDENT_AMBULATORY_CARE_PROVIDER_SITE_OTHER): Payer: Medicare Other

## 2016-07-17 DIAGNOSIS — E063 Autoimmune thyroiditis: Secondary | ICD-10-CM

## 2016-07-17 DIAGNOSIS — Z794 Long term (current) use of insulin: Secondary | ICD-10-CM

## 2016-07-17 DIAGNOSIS — E1165 Type 2 diabetes mellitus with hyperglycemia: Secondary | ICD-10-CM | POA: Diagnosis not present

## 2016-07-17 DIAGNOSIS — E038 Other specified hypothyroidism: Secondary | ICD-10-CM | POA: Diagnosis not present

## 2016-07-17 LAB — COMPREHENSIVE METABOLIC PANEL
ALT: 11 U/L (ref 0–35)
AST: 21 U/L (ref 0–37)
Albumin: 4.2 g/dL (ref 3.5–5.2)
Alkaline Phosphatase: 38 U/L — ABNORMAL LOW (ref 39–117)
BUN: 21 mg/dL (ref 6–23)
CHLORIDE: 99 meq/L (ref 96–112)
CO2: 28 meq/L (ref 19–32)
CREATININE: 0.99 mg/dL (ref 0.40–1.20)
Calcium: 10 mg/dL (ref 8.4–10.5)
GFR: 69.18 mL/min (ref >60.00)
GLUCOSE: 204 mg/dL — AB (ref 70–99)
Potassium: 3.8 mEq/L (ref 3.5–5.1)
Sodium: 137 mEq/L (ref 135–145)
Total Bilirubin: 0.4 mg/dL (ref 0.2–1.2)
Total Protein: 7.4 g/dL (ref 6.0–8.3)

## 2016-07-17 LAB — TSH: TSH: 1.45 u[IU]/mL (ref 0.35–4.50)

## 2016-07-17 LAB — HEMOGLOBIN A1C: Hgb A1c MFr Bld: 7.5 % — ABNORMAL HIGH (ref 4.6–6.5)

## 2016-07-19 NOTE — Progress Notes (Signed)
[name] 83 y.o.            Reason for Appointment: Diabetes follow-up   History of Present Illness   Diagnosis: Type 2 DIABETES MELITUS, date of diagnosis:   2000   She has been on insulin since 2004 with difficulty controlling adequately because of compliance with diet and variability in blood sugars. Her previous records are not available at present, however she has been on basal insulin with mealtime coverage since at least 2007 She has been tried on Byetta previously but did not tolerate this well She tried Victoza but didn't not benefit from Victoza 0.6 dosage; had nausea from 1.2 mg; did not continue this because of this side effect She also was given a trial of the V-go pump but she subjectively did not like this  RECENT history:    INSULIN: Toujeo 22 units in morning, HUMALOG 6-8 acb, 4 lunch and 0-4 at supper    She was told to change her Levemir to [place] but she did this only about a month ago   A1c is usually around 8%, it is now 7.5  She has the following blood sugar patterns and problems identified  She is recently having much better fasting blood sugars and these may be low normal at times  Previously was having average blood sugar in the morning about 150  She is however not taking Humalog at breakfast time since she got confused by the package insert which said not to mix Humalog and Toujeo  She does not take any Humalog at suppertime but is taking only 4 units at lunchtime, previously taking at least 6 units  She again is having mostly high postprandial readings after breakfast and lunch   Also did go back to starting the Invokamet once a day and is not concerned about side effects as much, this is helping her control and also her weight is down 2 pounds  She is having consistently high readings after breakfast, however not always higher after lunch with taking her Humalog  Has had 1 reading in the 60s probably from taking the postprandial doses of  Humalog in the morning  She says that she was told by her [facility] to use the true Metrix meter and not the One Touch  Oral hypoglycemic drugs: Metformin 750 mg at supper, Invokamet 50/1000: Not taking   .         Monitors blood glucose:  about 2-3 times daily     Glucometer: One Touch ultra .          Blood Glucose readings from download:  Mean values apply above for all meters except median for One Touch  PRE-MEAL Fasting Lunch Dinner Bedtime Overall  Glucose range: 71-128 65-268  136-222     Mean/median: 105  188  156   122   POST-MEAL PC Breakfast PC Lunch PC Dinner  Glucose range:  106-432    Mean/median:        Meals: 3 meals per day:  breakfast is mostly peanut butter or cheese crackers 7 am;  lunch 12-2 pm, dinner 5 pm Dinner low carb Physical activity: exercise: walks upto 30 min; 1-3/7  days  Wt Readings from Last 3 Encounters:  07/20/16 173 lb (78.5 kg)  05/22/16 175 lb (79.4 kg)  04/07/16 164 lb (74.4 kg)    Lab Results  Component Value Date   HGBA1C 7.5 (H) 07/17/2016   HGBA1C 8.0 (H) 02/02/2016   HGBA1C 7.9 (H) 10/22/2015  Lab Results  Component Value Date   MICROALBUR 48.8 (H) 04/04/2016   LDLCALC 64 04/04/2016   CREATININE 0.99 07/17/2016    Lab on 07/17/2016  Component Date Value Ref Range Status  . Hgb A1c MFr Bld 07/17/2016 7.5* 4.6 - 6.5 % Final  . Sodium 07/17/2016 137  135 - 145 mEq/L Final  . Potassium 07/17/2016 3.8  3.5 - 5.1 mEq/L Final  . Chloride 07/17/2016 99  96 - 112 mEq/L Final  . CO2 07/17/2016 28  19 - 32 mEq/L Final  . Glucose, Bld 07/17/2016 204* 70 - 99 mg/dL Final  . BUN 07/17/2016 21  6 - 23 mg/dL Final  . Creatinine, Ser 07/17/2016 0.99  0.40 - 1.20 mg/dL Final  . Total Bilirubin 07/17/2016 0.4  0.2 - 1.2 mg/dL Final  . Alkaline Phosphatase 07/17/2016 38* 39 - 117 U/L Final  . AST 07/17/2016 21  0 - 37 U/L Final  . ALT 07/17/2016 11  0 - 35 U/L Final  . Total Protein 07/17/2016 7.4  6.0 - 8.3 g/dL Final  .  Albumin 07/17/2016 4.2  3.5 - 5.2 g/dL Final  . Calcium 07/17/2016 10.0  8.4 - 10.5 mg/dL Final  . GFR 07/17/2016 69.18  >60.00 mL/min Final  . TSH 07/17/2016 1.45  0.35 - 4.50 uIU/mL Final     Allergies as of 07/20/2016   No Known Allergies     Medication List       Accurate as of 07/20/16  1:30 PM. Always use your most recent med list.          aspirin EC 81 MG tablet Take 1 tablet (81 mg total) by mouth daily.   B-D UF III MINI PEN NEEDLES 31G X 5 MM Misc Generic drug:  Insulin Pen Needle USE 3 PER DAY   benazepril 20 MG tablet Commonly known as:  LOTENSIN Take by mouth daily. Take 1/2 tablet by mouth daily   Canagliflozin-Metformin HCl 50-1000 MG Tabs Commonly known as:  INVOKAMET Take 1 tablet by mouth 2 (two) times daily.   carvedilol 12.5 MG tablet Commonly known as:  COREG TAKE 1 TABLET (12.5 MG TOTAL) BY MOUTH 2 (TWO) TIMES DAILY.   cholecalciferol 1000 units tablet Commonly known as:  VITAMIN D Take 1,000 Units by mouth daily.   Co Q-10 100 MG Caps Take 1 capsule by mouth 2 (two) times daily.   furosemide 20 MG tablet Commonly known as:  LASIX Take 1 tablet (20 mg total) by mouth daily as needed for fluid or edema (or weight gain).   glucose blood test strip Commonly known as:  ONETOUCH VERIO Use to test blood sugar 3 times daily Dx code E11.65   Insulin Detemir 100 UNIT/ML Pen Commonly known as:  LEVEMIR FLEXTOUCH INJECT 22 UNITS INTO THE SKIN DAILY BEFORE BREAKFAST.   insulin lispro 100 UNIT/ML KiwkPen Commonly known as:  HUMALOG Inject 0.06-0.08 mLs (6-8 Units total) into the skin 2 (two) times daily.   levothyroxine 75 MCG tablet Commonly known as:  SYNTHROID, LEVOTHROID TAKE 1 TABLET BY MOUTH DAILY   metFORMIN 750 MG 24 hr tablet Commonly known as:  GLUCOPHAGE-XR Take 2 tablets daily   multivitamins ther. w/minerals Tabs tablet Take 1 tablet by mouth daily.   rosuvastatin 20 MG tablet Commonly known as:  CRESTOR TAKE 1 TABLET(20  MG) BY MOUTH DAILY   TOUJEO SOLOSTAR 300 UNIT/ML Sopn Generic drug:  Insulin Glargine 22 Units daily.   TUMS 500 MG chewable tablet Generic drug:  calcium carbonate Chew 1 tablet by mouth daily as needed for heartburn (indigestion).       Allergies: No Known Allergies  Past Medical History:  Diagnosis Date  . Aortic valve disorders    S/P AVR 06/2012  . Arthritis   . Asthma    AS CHILD   . Depression   . Diabetes mellitus   . Heart murmur   . Hyperlipidemia   . Hypertension   . Hypothyroidism   . Ischemic heart disease    with prior CABG x 2 in 2014 along with AVR  . Lumbago   . Shortness of breath   . Thyroid disease     Past Surgical History:  Procedure Laterality Date  . AORTIC VALVE REPLACEMENT N/A 06/25/2012   Procedure: AORTIC VALVE REPLACEMENT (AVR);  Surgeon: Grace Isaac, MD;  Location: [place];  Service: Open Heart Surgery;  Laterality: N/A;  . BREAST SURGERY     LT BREAST BX BENIGN  . CORONARY ARTERY BYPASS GRAFT N/A 06/25/2012   Procedure: Coronary artery bypass graft  times two using left internal mammary artery and right leg greater saphenous vein;  Surgeon: Grace Isaac, MD;  Location: [place];  Service: Open Heart Surgery;  Laterality: N/A;  . INTRAOPERATIVE TRANSESOPHAGEAL ECHOCARDIOGRAM N/A 06/25/2012   Procedure: INTRAOPERATIVE TRANSESOPHAGEAL ECHOCARDIOGRAM;  Surgeon: Grace Isaac, MD;  Location: [place];  Service: Open Heart Surgery;  Laterality: N/A;  . TUBAL LIGATION      Family History  Problem Relation Age of Onset  . Heart disease Father     Social History:  reports that she has never smoked. She has never used smokeless tobacco. She reports that she does not drink alcohol or use drugs.  Review of Systems:   HYPERTENSION: Blood pressure is well-controlled, followed by PCP She is somewhat anxious today  BP Readings from Last 3 Encounters:  07/20/16 (!) 146/78  05/24/16 (!) 151/71  05/22/16 140/80     HYPOTHYROIDISM: She  has had long-standing hypothyroidism  She has had consistent TSH level with current 75 g dose As follows   Lab Results  Component Value Date   TSH 1.45 07/17/2016   TSH 1.74 02/02/2016   TSH 1.33 10/22/2015   FREET4 1.13 06/02/2015   FREET4 1.07 03/11/2014   FREET4 1.01 09/25/2013    HYPERLIPIDEMIA: The lipid abnormality consists of elevated LDL; has been  controlled with Crestor 20 mg LDL as below      Lab Results  Component Value Date   CHOL 123 04/04/2016   HDL 48.90 04/04/2016   LDLCALC 64 04/04/2016   LDLDIRECT 102.8 11/28/2013   TRIG 54.0 04/04/2016   CHOLHDL 3 04/04/2016  '    She does see a podiatrist regularly for toenail trimming  Last foot exam was in 12/16: Diabetic foot exam shows normal monofilament sensation in the toes and plantar surfaces, no skin lesions or ulcers on the feet and normal pedal pulses    Examination:   BP (!) 146/78   Pulse 69   Ht 5\' 3"  (1.6 m)   Wt 173 lb (78.5 kg)   SpO2 95%   BMI 30.65 kg/m   Body mass index is 30.65 kg/m.    ASSESSMENT/ PLAN:   Diabetes type 2, uncontrolled   See history of present illness for a discussion of the blood sugar patterns, recent management and problems identified  Blood sugars are much better with switching from Levemir to Toujeo Blood sugars are low normal in the  morning without overt hypoglycemia but she thinks she feels her sugars to be relatively low in the mornings She is now confused about taking her Humalog and is not taking this at breakfast Also although she is taking some insulin at lunch her sugars are not going up as much She does take Invokamet now and agrees to continue this; no side effects from this and no change in renal function   Recommendations:  Discussed in detail the differences between basal and bolus insulin  She does need to take Humalog with every meal regardless of pre-meal blood sugar  Most likely she can try 6 units in the morning  She may need 6 units  at lunch also especially if eating a larger carbohydrate meal  She will start using a One Touch Verio meter that was previously given to her  She can reduce her Toujeo by 2 units for now and more if fasting readings a low-normal; encouraged her to continue this because of better control compared to Levemir otherwise May switch to Lantus or another cheaper basal insulin   HYPERTENSION: controlled, Probably will be better with restarting Invokana   Hypothyroidism: TSH okay and she will continue the same dose   Patient Instructions  Toujeo 20 units daily   6 Humalog at Bfst daily  Medicare B pays for strips    Counseling time on subjects discussed above is over 50% of today's 25 minute visit   Goldie Tregoning 07/20/2016, 1:30 PM   Note: This office note was prepared with Estate agent. Any transcriptional errors that result from this process are unintentional.

## 2016-07-20 ENCOUNTER — Ambulatory Visit (INDEPENDENT_AMBULATORY_CARE_PROVIDER_SITE_OTHER): Payer: Medicare Other | Admitting: Endocrinology

## 2016-07-20 ENCOUNTER — Encounter: Payer: Self-pay | Admitting: Endocrinology

## 2016-07-20 ENCOUNTER — Other Ambulatory Visit: Payer: Self-pay

## 2016-07-20 VITALS — BP 146/78 | HR 69 | Ht 63.0 in | Wt 173.0 lb

## 2016-07-20 DIAGNOSIS — I10 Essential (primary) hypertension: Secondary | ICD-10-CM

## 2016-07-20 DIAGNOSIS — Z794 Long term (current) use of insulin: Secondary | ICD-10-CM | POA: Diagnosis not present

## 2016-07-20 DIAGNOSIS — E038 Other specified hypothyroidism: Secondary | ICD-10-CM | POA: Diagnosis not present

## 2016-07-20 DIAGNOSIS — E1165 Type 2 diabetes mellitus with hyperglycemia: Secondary | ICD-10-CM

## 2016-07-20 DIAGNOSIS — E063 Autoimmune thyroiditis: Secondary | ICD-10-CM

## 2016-07-20 MED ORDER — GLUCOSE BLOOD VI STRP: ORAL_STRIP | 5 refills | Status: DC

## 2016-07-20 NOTE — Patient Instructions (Signed)
Toujeo 20 units daily   6 Humalog at Bfst daily  Medicare B pays for strips

## 2016-07-24 ENCOUNTER — Other Ambulatory Visit: Payer: Self-pay | Admitting: Internal Medicine

## 2016-07-24 DIAGNOSIS — Z1231 Encounter for screening mammogram for malignant neoplasm of breast: Secondary | ICD-10-CM

## 2016-07-25 NOTE — Progress Notes (Signed)
CARDIOLOGY OFFICE NOTE  Date:  07/26/2016    [name] Date of Birth: December 19, 1932 Medical Record #[id number]  PCP:  Rogers Blocker, MD  Cardiologist:  [facility]   Chief Complaint  Patient presents with  . Hypertension  . Coronary Artery Disease    Follow up visit - seen for Dr. Acie Fredrickson    History of Present Illness: [name] is a 83 y.o. female who presents today for a follow up visit. Seen for Dr. Acie Fredrickson. She now primarily follows with me.   She has a history of DM, prior AVR and CABG x 2 per EBG back in 2014 - this was with a LIMA to the LAD and SVG to the LCX, with a pericardial Edwards tissue valve #21 mm. Did have post op atrial fib - was on amiodarone. Other issues include HLD, HTN, DM, depression and hypothyroidism. Myoview ok from 06/2013. Last echo 12/2014.   I saw her back in September of 2016 for a work in visit - she was short of breath. Labs looked ok. Blood count actually improved. Echo updated - did have elevated filling pressures but otherwise unchanged. Prosthetic AV ok. Normal EF and had grade 1 diastolic dysfunction. I wanted to put her on low dose Lasix but she did not wish to proceed due to having frequent urination when on Invokana.   Seen in November of 2016 by me and she was doing ok- not really clear as to why she had improved but she had. She had talked with Dr. Dwyane Dee and he also encouraged prn diuretic. She was agreeable to trying. Last seen back in [place] she was doing well - using prn diuretics. BP had been running lower and her Plendil was cut back.   Seen several times back since - BP still low and Plendil had been stopped altogether. She was working on losing weight. Did complain of fatigue/being tired. Asked her to continue to monitor the BP - may need to start cutting back Coreg. Last seen back in November and she was doing well. Tends to check BP prior to taking her medicines.   Back here today. Here with her sister - they  live together. Has not been checking her BP at home. Little high last week with Dr. Dwyane Dee. No chest pain. Breathing is ok. She notes some fatigue - but attributes it to her eye injections - it passes after a week or so - and overall she feels like she is ok. Tolerating her medicines well. Has not been as active with the winter but hoping to do more now that the weather is improving. She really has no concerns today and feels like she is doing well.  Past Medical History:  Diagnosis Date  . Aortic valve disorders    S/P AVR 06/2012  . Arthritis   . Asthma    AS CHILD   . Depression   . Diabetes mellitus   . Heart murmur   . Hyperlipidemia   . Hypertension   . Hypothyroidism   . Ischemic heart disease    with prior CABG x 2 in 2014 along with AVR  . Lumbago   . Shortness of breath   . Thyroid disease     Past Surgical History:  Procedure Laterality Date  . AORTIC VALVE REPLACEMENT N/A 06/25/2012   Procedure: AORTIC VALVE REPLACEMENT (AVR);  Surgeon: Grace Isaac, MD;  Location: [facility];  Service: Open Heart Surgery;  Laterality: N/A;  .  BREAST SURGERY     LT BREAST BX BENIGN  . CORONARY ARTERY BYPASS GRAFT N/A 06/25/2012   Procedure: Coronary artery bypass graft  times two using left internal mammary artery and right leg greater saphenous vein;  Surgeon: Grace Isaac, MD;  Location: [facility];  Service: Open Heart Surgery;  Laterality: N/A;  . INTRAOPERATIVE TRANSESOPHAGEAL ECHOCARDIOGRAM N/A 06/25/2012   Procedure: INTRAOPERATIVE TRANSESOPHAGEAL ECHOCARDIOGRAM;  Surgeon: Grace Isaac, MD;  Location: [facility];  Service: Open Heart Surgery;  Laterality: N/A;  . TUBAL LIGATION       Medications: Current Outpatient Prescriptions  Medication Sig Dispense Refill  . aspirin EC 81 MG tablet Take 1 tablet (81 mg total) by mouth daily.    . B-D UF III MINI PEN NEEDLES 31G X 5 MM MISC USE 3 PER DAY 100 each 2  . benazepril (LOTENSIN) 20 MG tablet Take by mouth daily. Take 1/2 tablet by  mouth daily    . calcium carbonate (TUMS) 500 MG chewable tablet Chew 1 tablet by mouth daily as needed for heartburn (indigestion).    . Canagliflozin-Metformin HCl (INVOKAMET) 50-1000 MG TABS Take 1 tablet by mouth 2 (two) times daily. 180 tablet 1  . carvedilol (COREG) 12.5 MG tablet TAKE 1 TABLET (12.5 MG TOTAL) BY MOUTH 2 (TWO) TIMES DAILY. 60 tablet 10  . cholecalciferol (VITAMIN D) 1000 UNITS tablet Take 1,000 Units by mouth daily.    . Coenzyme Q10 (CO Q-10) 100 MG CAPS Take 1 capsule by mouth 2 (two) times daily.    . furosemide (LASIX) 20 MG tablet Take 1 tablet (20 mg total) by mouth daily as needed for fluid or edema (or weight gain). 30 tablet 6  . glucose blood (ONETOUCH VERIO) test strip Use to test blood sugar 3 times daily Dx code E11.65 150 each 5  . insulin lispro (HUMALOG) 100 UNIT/ML KiwkPen Inject 0.06-0.08 mLs (6-8 Units total) into the skin 2 (two) times daily. 15 mL 3  . levothyroxine (SYNTHROID, LEVOTHROID) 75 MCG tablet TAKE 1 TABLET BY MOUTH DAILY 30 tablet 5  . metFORMIN (GLUCOPHAGE-XR) 750 MG 24 hr tablet Take 2 tablets daily (Patient taking differently: Take 1 tablets daily) 180 tablet 1  . Multiple Vitamins-Minerals (MULTIVITAMINS THER. W/MINERALS) TABS Take 1 tablet by mouth daily.    . rosuvastatin (CRESTOR) 20 MG tablet TAKE 1 TABLET(20 MG) BY MOUTH DAILY 30 tablet 5  . TOUJEO SOLOSTAR 300 UNIT/ML SOPN 22 Units daily.      No current facility-administered medications for this visit.     Allergies: No Known Allergies  Social History: The patient  reports that she has never smoked. She has never used smokeless tobacco. She reports that she does not drink alcohol or use drugs.   Family History: The patient's family history includes Heart disease in her father.   Review of Systems: Please see the history of present illness.   Otherwise, the review of systems is positive for none.   All other systems are reviewed and negative.   Physical Exam: VS:  BP (!)  160/88   Pulse 72   Ht 5\' 4"  (1.626 m)   Wt 172 lb 12.8 oz (78.4 kg)   BMI 29.66 kg/m  .  BMI Body mass index is 29.66 kg/m.  Wt Readings from Last 3 Encounters:  07/26/16 172 lb 12.8 oz (78.4 kg)  07/20/16 173 lb (78.5 kg)  05/22/16 175 lb (79.4 kg)   BP recheck by me is 130/80.   General:  Pleasant. Well developed, well nourished and in no acute distress.  Down one pound since last visit with me.  HEENT: Normal.  Neck: Supple, no JVD, carotid bruits, or masses noted.  Cardiac: Regular rate and rhythm. No murmurs, rubs, or gallops. No edema.  Respiratory:  Lungs are clear to auscultation bilaterally with normal work of breathing.  GI: Soft and nontender.  MS: No deformity or atrophy. Gait and ROM intact.  Skin: Warm and dry. Color is normal.  Neuro:  Strength and sensation are intact and no gross focal deficits noted.  Psych: Alert, appropriate and with normal affect.   LABORATORY DATA:  EKG:  EKG is not ordered today.  Lab Results  Component Value Date   WBC 5.1 02/08/2016   HGB 11.3 (L) 02/08/2016   HCT 34.1 (L) 02/08/2016   PLT 232 02/08/2016   GLUCOSE 204 (H) 07/17/2016   CHOL 123 04/04/2016   TRIG 54.0 04/04/2016   HDL 48.90 04/04/2016   LDLDIRECT 102.8 11/28/2013   LDLCALC 64 04/04/2016   ALT 11 07/17/2016   AST 21 07/17/2016   NA 137 07/17/2016   K 3.8 07/17/2016   CL 99 07/17/2016   CREATININE 0.99 07/17/2016   BUN 21 07/17/2016   CO2 28 07/17/2016   TSH 1.45 07/17/2016   INR 1.57 (H) 06/25/2012   HGBA1C 7.5 (H) 07/17/2016   MICROALBUR 48.8 (H) 04/04/2016    BNP (last 3 results) No results for input(s): BNP in the last 8760 hours.  ProBNP (last 3 results) No results for input(s): PROBNP in the last 8760 hours.   Other Studies Reviewed Today:  Echo Study Conclusions from 12/2014  - Left ventricle: The cavity size was normal. There was mild focal basal hypertrophy of the septum. Systolic function was normal. The estimated ejection  fraction was in the range of 55% to 60%. Wall motion was normal; there were no regional wall motion abnormalities. There was an increased relative contribution of atrial contraction to ventricular filling. Doppler parameters are consistent with abnormal left ventricular relaxation (grade 1 diastolic dysfunction). Doppler parameters are consistent with high ventricular filling pressure. - Aortic valve: Poorly visualized. A bioprosthesis was present and functioning normally. Peak velocity (S): 248 cm/s. Mean gradient (S): 12 mm Hg. Valve area (VTI): 0.94 cm^2. Valve area (Vmax): 0.97 cm^2. Valve area (Vmean): 0.95 cm^2. - Mitral valve: Calcified annulus. Mild diffuse thickening. There was mild regurgitation. - Left atrium: The atrium was mildly dilated. - Pulmonic valve: There was trivial regurgitation.   Myoview Impression from 06/2013 Exercise Capacity: Lexiscan with no exercise. BP Response: Normal blood pressure response. Clinical Symptoms: No significant symptoms noted. ECG Impression: There are scattered PVCs. Comparison with Prior Nuclear Study: No previous nuclear study performed  Overall Impression: Low risk stress nuclear study small fixed distal anterior artifact which could be breast attenuation or other artifact.  LV Ejection Fraction: 66%. LV Wall Motion: Normal Wall Motion  [name], MD, [facility]    Assessment/Plan:  1. CAD - s/p CABG 06-27-12 -last Myoview stable from 2015. No active chest pain.Would continue with CV risk factor modification.   2. Aortic stenosis with prior AVR - echo from 2016 wasok. Did have elevated filling pressures - Now using low dose Lasix prn. She has improved clinically. Consider updating on return.   3. Post op atrial fibrillation - remains in NSR by exam   4. HTN - recheck by me is fine. I have left her on her current regimen for now.  5. Obesity- has been trying to intentionally lose  weight.   6. Fatigue - most likely multifactorial - but improved.   Current medicines are reviewed with the patient today.  The patient does not have concerns regarding medicines other than what has been noted above.  The following changes have been made:  See above.  Labs/ tests ordered today include:   No orders of the defined types were placed in this encounter.    Disposition:   FU with me in 4 months.   Patient is agreeable to this plan and will call if any problems develop in the interim.   SignedTruitt Merle, NP  07/26/2016 9:39 AM  [facility] [address] [place], [place]  [address] Phone: [phone] Fax: [phone]

## 2016-07-26 ENCOUNTER — Encounter: Payer: Self-pay | Admitting: Nurse Practitioner

## 2016-07-26 ENCOUNTER — Ambulatory Visit (INDEPENDENT_AMBULATORY_CARE_PROVIDER_SITE_OTHER): Payer: Medicare Other | Admitting: Nurse Practitioner

## 2016-07-26 VITALS — BP 160/88 | HR 72 | Ht 64.0 in | Wt 172.8 lb

## 2016-07-26 DIAGNOSIS — R5383 Other fatigue: Secondary | ICD-10-CM

## 2016-07-26 DIAGNOSIS — I10 Essential (primary) hypertension: Secondary | ICD-10-CM | POA: Diagnosis not present

## 2016-07-26 DIAGNOSIS — I259 Chronic ischemic heart disease, unspecified: Secondary | ICD-10-CM

## 2016-07-26 NOTE — Patient Instructions (Addendum)
We will be checking the following labs today - NONE   Medication Instructions:    Continue with your current medicines.     Testing/Procedures To Be Arranged:  N/A  Follow-Up:   See me in 4 months    Other Special Instructions:   N/A    If you need a refill on your cardiac medications before your next appointment, please call your pharmacy.   Call the [facility] office at [phone] if you have any questions, problems or concerns.      

## 2016-08-10 ENCOUNTER — Ambulatory Visit
Admission: RE | Admit: 2016-08-10 | Discharge: 2016-08-10 | Disposition: A | Discharge status: Home / Self Care | Payer: Medicare Other | Source: Ambulatory Visit | Attending: Internal Medicine | Admitting: Internal Medicine

## 2016-08-10 DIAGNOSIS — Z1231 Encounter for screening mammogram for malignant neoplasm of breast: Secondary | ICD-10-CM | POA: Diagnosis not present

## 2016-08-11 ENCOUNTER — Other Ambulatory Visit: Payer: Self-pay | Admitting: Internal Medicine

## 2016-08-11 DIAGNOSIS — R928 Other abnormal and inconclusive findings on diagnostic imaging of breast: Secondary | ICD-10-CM

## 2016-08-23 ENCOUNTER — Ambulatory Visit: Payer: Medicare Other | Admitting: Podiatry

## 2016-08-24 ENCOUNTER — Other Ambulatory Visit: Payer: Self-pay | Admitting: Internal Medicine

## 2016-08-24 ENCOUNTER — Ambulatory Visit
Admission: RE | Admit: 2016-08-24 | Discharge: 2016-08-24 | Disposition: A | Discharge status: Home / Self Care | Payer: Medicare Other | Source: Ambulatory Visit | Attending: Internal Medicine | Admitting: Internal Medicine

## 2016-08-24 DIAGNOSIS — R928 Other abnormal and inconclusive findings on diagnostic imaging of breast: Secondary | ICD-10-CM

## 2016-08-24 DIAGNOSIS — R921 Mammographic calcification found on diagnostic imaging of breast: Secondary | ICD-10-CM | POA: Diagnosis not present

## 2016-08-25 ENCOUNTER — Ambulatory Visit
Admission: RE | Admit: 2016-08-25 | Discharge: 2016-08-25 | Disposition: A | Discharge status: Home / Self Care | Payer: Medicare Other | Source: Ambulatory Visit | Attending: Internal Medicine | Admitting: Internal Medicine

## 2016-08-25 DIAGNOSIS — R921 Mammographic calcification found on diagnostic imaging of breast: Secondary | ICD-10-CM

## 2016-08-25 DIAGNOSIS — N641 Fat necrosis of breast: Secondary | ICD-10-CM | POA: Diagnosis not present

## 2016-08-29 HISTORY — PX: BREAST BIOPSY: SHX20

## 2016-08-31 DIAGNOSIS — H35033 Hypertensive retinopathy, bilateral: Secondary | ICD-10-CM | POA: Diagnosis not present

## 2016-08-31 DIAGNOSIS — H43813 Vitreous degeneration, bilateral: Secondary | ICD-10-CM | POA: Diagnosis not present

## 2016-08-31 DIAGNOSIS — E113313 Type 2 diabetes mellitus with moderate nonproliferative diabetic retinopathy with macular edema, bilateral: Secondary | ICD-10-CM | POA: Diagnosis not present

## 2016-09-07 DIAGNOSIS — D649 Anemia, unspecified: Secondary | ICD-10-CM | POA: Diagnosis not present

## 2016-09-07 DIAGNOSIS — E039 Hypothyroidism, unspecified: Secondary | ICD-10-CM | POA: Diagnosis not present

## 2016-09-07 DIAGNOSIS — E119 Type 2 diabetes mellitus without complications: Secondary | ICD-10-CM | POA: Diagnosis not present

## 2016-09-07 DIAGNOSIS — I10 Essential (primary) hypertension: Secondary | ICD-10-CM | POA: Diagnosis not present

## 2016-09-18 ENCOUNTER — Other Ambulatory Visit: Payer: Self-pay | Admitting: Endocrinology

## 2016-09-19 ENCOUNTER — Ambulatory Visit (INDEPENDENT_AMBULATORY_CARE_PROVIDER_SITE_OTHER): Payer: Medicare Other | Admitting: Podiatry

## 2016-09-19 ENCOUNTER — Encounter: Payer: Self-pay | Admitting: Podiatry

## 2016-09-19 VITALS — BP 163/84 | HR 69

## 2016-09-19 DIAGNOSIS — B351 Tinea unguium: Secondary | ICD-10-CM | POA: Diagnosis not present

## 2016-09-19 DIAGNOSIS — M79676 Pain in unspecified toe(s): Secondary | ICD-10-CM | POA: Diagnosis not present

## 2016-09-19 DIAGNOSIS — E1151 Type 2 diabetes mellitus with diabetic peripheral angiopathy without gangrene: Secondary | ICD-10-CM

## 2016-09-19 DIAGNOSIS — L84 Corns and callosities: Secondary | ICD-10-CM

## 2016-09-19 NOTE — Patient Instructions (Signed)

## 2016-09-19 NOTE — Progress Notes (Signed)
Patient ID: [name], female   DOB: 1933/02/20, 83 y.o.   MRN: [id number]    Subjective: This patient presents for scheduled visit complaining of painful toenails when walking wearing shoes and requests nail debridement  Objective: DP pulses 2/4 bilaterally PT pulses 1/4 bilaterally Capillary reflex delayed bilaterally Sensation to 10 g monofilament wire intact 4/5 right and 5/5 left Vibratory sensation reactive bilaterally HAV left Ankle reflexes equal and reactive bilaterally No open skin lesions bilaterally Absent hair growth bilaterally Hammertoe second bilaterally Plantar callus subsecond MPJ right The toenails are elongated, brittle, discolored, incurvated and tender to direct palpation 6-10 Corn second and fifth right toes and fifth left  Assessment: Diabetic with peripheral arterial disease Symptomatic onychomycoses 6-10 keratoses 4  Plan: Debrided toenails 10 and mechanically and electrically without any bleeding Debride keratoses 4 without any bleeding  Reappoint 3 months

## 2016-09-26 DIAGNOSIS — D509 Iron deficiency anemia, unspecified: Secondary | ICD-10-CM | POA: Diagnosis not present

## 2016-10-04 ENCOUNTER — Other Ambulatory Visit: Payer: Self-pay | Admitting: Endocrinology

## 2016-10-06 ENCOUNTER — Other Ambulatory Visit: Payer: Self-pay | Admitting: Nurse Practitioner

## 2016-10-17 ENCOUNTER — Other Ambulatory Visit (INDEPENDENT_AMBULATORY_CARE_PROVIDER_SITE_OTHER): Payer: Medicare Other

## 2016-10-17 DIAGNOSIS — E1165 Type 2 diabetes mellitus with hyperglycemia: Secondary | ICD-10-CM | POA: Diagnosis not present

## 2016-10-17 DIAGNOSIS — Z794 Long term (current) use of insulin: Secondary | ICD-10-CM

## 2016-10-17 LAB — BASIC METABOLIC PANEL
BUN: 22 mg/dL (ref 6–23)
CALCIUM: 9.8 mg/dL (ref 8.4–10.5)
CO2: 30 meq/L (ref 19–32)
CREATININE: 0.96 mg/dL (ref 0.40–1.20)
Chloride: 101 mEq/L (ref 96–112)
GFR: 71.64 mL/min (ref >60.00)
Glucose, Bld: 132 mg/dL — ABNORMAL HIGH (ref 70–99)
Potassium: 3.9 mEq/L (ref 3.5–5.1)
Sodium: 138 mEq/L (ref 135–145)

## 2016-10-17 LAB — HEMOGLOBIN A1C: HEMOGLOBIN A1C: 9 % — AB (ref 4.6–6.5)

## 2016-10-20 ENCOUNTER — Ambulatory Visit (INDEPENDENT_AMBULATORY_CARE_PROVIDER_SITE_OTHER): Payer: Medicare Other | Admitting: Endocrinology

## 2016-10-20 ENCOUNTER — Encounter: Payer: Self-pay | Admitting: Endocrinology

## 2016-10-20 VITALS — BP 124/76 | HR 68 | Ht 60.75 in | Wt 170.0 lb

## 2016-10-20 DIAGNOSIS — I259 Chronic ischemic heart disease, unspecified: Secondary | ICD-10-CM

## 2016-10-20 DIAGNOSIS — E1165 Type 2 diabetes mellitus with hyperglycemia: Secondary | ICD-10-CM

## 2016-10-20 DIAGNOSIS — Z794 Long term (current) use of insulin: Secondary | ICD-10-CM | POA: Diagnosis not present

## 2016-10-20 DIAGNOSIS — I10 Essential (primary) hypertension: Secondary | ICD-10-CM | POA: Diagnosis not present

## 2016-10-20 NOTE — Progress Notes (Signed)
[name] 83 y.o.            Reason for Appointment: Diabetes follow-up   History of Present Illness   Diagnosis: Type 2 DIABETES MELITUS, date of diagnosis:   2000   She has been on insulin since 2004 with difficulty controlling adequately because of compliance with diet and variability in blood sugars. Her previous records are not available at present, however she has been on basal insulin with mealtime coverage since at least 2007 She has been tried on Byetta previously but did not tolerate this well She tried Victoza but didn't not benefit from Victoza 0.6 dosage; had nausea from 1.2 mg; did not continue this because of this side effect She also was given a trial of the V-go pump but she subjectively did not like this  RECENT history:    INSULIN: Toujeo 22 units in morning, HUMALOG 6-8 acb, 6 lunch and 0-4 at supper     A1c is usually around 8%, it is now 9  She has the following blood sugar patterns and problems identified  She has a row date and time on her monitor and unable to get a pattern of her blood sugars  Not clear why her A1c is higher because she has not had consistently high readings  However she probably not checking enough readings after meals especially lunch  Despite previous improved compliance she is again not taking her insulin with her when she is eating out at lunch  Also she is likely eating more for meal in the evening at dinnertime instead of a snack and not taking much insulin for this  Again HIGHEST blood sugars are probably in the afternoon but frequently will have readings above target the evenings after supper  FASTING readings are generally higher than her last visit even though she is continuing to take the same dose of Toujeo  No hypoglycemia  She likes to take her Invokamet half tablet twice a day because of increased urination with full tablet  Oral hypoglycemic drugs: Metformin 750 mg at supper, Invokamet 50/1000: She is taking  this now "about   .         Monitors blood glucose:  about 1-3 times daily     Glucometer: One Touch Verio.          Blood Glucose readings from download: Blood sugar reviewed from list of her readings from her download, no analysis possible today  Mean values apply above for all meters except median for One Touch  PRE-MEAL Fasting Lunch Dinner Bedtime Overall  Glucose range: 91-162       Mean/median:        POST-MEAL PC Breakfast PC Lunch PC Dinner  Glucose range:  -   Mean/median:  ?-372  152-331     Meals: 3 meals per day:  breakfast is At 7 am;  lunch 12-2 pm, dinner 5 pm Intake is quite variable at different meals   Physical activity: exercise: Trying to walk upto 30 min; 1-3/7  days  Wt Readings from Last 3 Encounters:  10/20/16 170 lb (77.1 kg)  07/26/16 172 lb 12.8 oz (78.4 kg)  07/20/16 173 lb (78.5 kg)    Lab Results  Component Value Date   HGBA1C 9.0 (H) 10/17/2016   HGBA1C 7.5 (H) 07/17/2016   HGBA1C 8.0 (H) 02/02/2016   Lab Results  Component Value Date   MICROALBUR 48.8 (H) 04/04/2016   LDLCALC 64 04/04/2016   CREATININE 0.96 10/17/2016  Lab on 10/17/2016  Component Date Value Ref Range Status  . Hgb A1c MFr Bld 10/17/2016 9.0* 4.6 - 6.5 % Final   Glycemic Control Guidelines for People with Diabetes:Non Diabetic:  <6%Goal of Therapy: <7%Additional Action Suggested:  >8%   . Sodium 10/17/2016 138  135 - 145 mEq/L Final  . Potassium 10/17/2016 3.9  3.5 - 5.1 mEq/L Final  . Chloride 10/17/2016 101  96 - 112 mEq/L Final  . CO2 10/17/2016 30  19 - 32 mEq/L Final  . Glucose, Bld 10/17/2016 132* 70 - 99 mg/dL Final  . BUN 10/17/2016 22  6 - 23 mg/dL Final  . Creatinine, Ser 10/17/2016 0.96  0.40 - 1.20 mg/dL Final  . Calcium 10/17/2016 9.8  8.4 - 10.5 mg/dL Final  . GFR 10/17/2016 71.64  >60.00 mL/min Final     Allergies as of 10/20/2016   No Known Allergies     Medication List       Accurate as of 10/20/16 11:59 PM. Always use your most recent  med list.          aspirin EC 81 MG tablet Take 1 tablet (81 mg total) by mouth daily.   B-D UF III MINI PEN NEEDLES 31G X 5 MM Misc Generic drug:  Insulin Pen Needle USE 3 PER DAY   benazepril 20 MG tablet Commonly known as:  LOTENSIN Take by mouth daily. Take 1/2 tablet by mouth daily   Canagliflozin-Metformin HCl 50-1000 MG Tabs Commonly known as:  INVOKAMET Take 1 tablet by mouth 2 (two) times daily.   carvedilol 12.5 MG tablet Commonly known as:  COREG TAKE 1 TABLET (12.5 MG TOTAL) BY MOUTH 2 (TWO) TIMES DAILY.   cholecalciferol 1000 units tablet Commonly known as:  VITAMIN D Take 1,000 Units by mouth daily.   Co Q-10 100 MG Caps Take 1 capsule by mouth 2 (two) times daily.   furosemide 20 MG tablet Commonly known as:  LASIX TAKE 1 TABLET (20 MG TOTAL) BY MOUTH DAILY AS NEEDED FOR FLUID OR EDEMA (OR WEIGHT GAIN).   glucose blood test strip Commonly known as:  ONETOUCH VERIO Use to test blood sugar 3 times daily Dx code E11.65   insulin lispro 100 UNIT/ML KiwkPen Commonly known as:  HUMALOG Inject 0.06-0.08 mLs (6-8 Units total) into the skin 2 (two) times daily.   levothyroxine 75 MCG tablet Commonly known as:  SYNTHROID, LEVOTHROID TAKE 1 TABLET BY MOUTH DAILY   metFORMIN 750 MG 24 hr tablet Commonly known as:  GLUCOPHAGE-XR Take 2 tablets daily   multivitamins ther. w/minerals Tabs tablet Take 1 tablet by mouth daily.   rosuvastatin 20 MG tablet Commonly known as:  CRESTOR TAKE 1 TABLET(20 MG) BY MOUTH DAILY   TOUJEO SOLOSTAR 300 UNIT/ML Sopn Generic drug:  Insulin Glargine 22 Units daily.   TUMS 500 MG chewable tablet Generic drug:  calcium carbonate Chew 1 tablet by mouth daily as needed for heartburn (indigestion).       Allergies: No Known Allergies  Past Medical History:  Diagnosis Date  . Aortic valve disorders    S/P AVR 06/2012  . Arthritis   . Asthma    AS CHILD   . Depression   . Diabetes mellitus   . Heart murmur   .  Hyperlipidemia   . Hypertension   . Hypothyroidism   . Ischemic heart disease    with prior CABG x 2 in 2014 along with AVR  . Lumbago   . Shortness of  breath   . Thyroid disease     Past Surgical History:  Procedure Laterality Date  . AORTIC VALVE REPLACEMENT N/A 06/25/2012   Procedure: AORTIC VALVE REPLACEMENT (AVR);  Surgeon: Grace Isaac, MD;  Location: [facility];  Service: Open Heart Surgery;  Laterality: N/A;  . BREAST SURGERY     LT BREAST BX BENIGN  . CORONARY ARTERY BYPASS GRAFT N/A 06/25/2012   Procedure: Coronary artery bypass graft  times two using left internal mammary artery and right leg greater saphenous vein;  Surgeon: Grace Isaac, MD;  Location: [facility];  Service: Open Heart Surgery;  Laterality: N/A;  . INTRAOPERATIVE TRANSESOPHAGEAL ECHOCARDIOGRAM N/A 06/25/2012   Procedure: INTRAOPERATIVE TRANSESOPHAGEAL ECHOCARDIOGRAM;  Surgeon: Grace Isaac, MD;  Location: [facility];  Service: Open Heart Surgery;  Laterality: N/A;  . TUBAL LIGATION      Family History  Problem Relation Age of Onset  . Heart disease Father     Social History:  reports that she has never smoked. She has never used smokeless tobacco. She reports that she does not drink alcohol or use drugs.  Review of Systems:   HYPERTENSION: Blood pressure is well-controlled,Usually followed by PCP   BP Readings from Last 3 Encounters:  10/20/16 124/76  09/19/16 (!) 163/84  07/26/16 (!) 160/88     HYPOTHYROIDISM: She has had long-standing hypothyroidism  She has had consistent TSH level with Levothyroxine 75 g dose    Lab Results  Component Value Date   TSH 1.45 07/17/2016   TSH 1.74 02/02/2016   TSH 1.33 10/22/2015   FREET4 1.13 06/02/2015   FREET4 1.07 03/11/2014   FREET4 1.01 09/25/2013    HYPERLIPIDEMIA: The lipid abnormality consists of elevated LDL; has been  controlled with Crestor 20 mg LDL as below      Lab Results  Component Value Date   CHOL 123 04/04/2016   HDL 48.90  04/04/2016   LDLCALC 64 04/04/2016   LDLDIRECT 102.8 11/28/2013   TRIG 54.0 04/04/2016   CHOLHDL 3 04/04/2016  '    She does see a podiatrist regularly for toenail trimming  Last foot exam Here was in 12/16: Diabetic foot exam shows normal monofilament sensation in the toes and plantar surfaces, no skin lesions or ulcers on the feet and normal pedal pulses    Examination:   BP 124/76   Pulse 68   Ht 5' 0.75" (1.543 m)   Wt 170 lb (77.1 kg)   SpO2 98%   BMI 32.39 kg/m   Body mass index is 32.39 kg/m.    ASSESSMENT/ PLAN:   Diabetes type 2, uncontrolled   See history of present illness for a discussion of the blood sugar patterns, recent management and problems identified  A1c is 9% Difficult to analyze her blood sugars as she has incorrect date programmed and also likely not checking readings after meals Her blood sugars maybe over 300 at times Most likely she is not taking enough insulin or missing this at lunch and dinner time causing hyperglycemia However she has been taking her Invokamet as directed  Recommendations:  Her meter was programmed to the right date  She does need to take Humalog with every meal and needs to take it with her when going out to eat  Most likely she can try 6 unitsof Humalog at suppertime.  She is eating a full meals  If She still has high readings in afternoonshe may need to take as much as 8 units at lunch  Also if fasting readings continue to be high with increasing her suppertime coverage she will need to increase her Toujeo by 2 units again  Explained to her that A1c is composed of postprandial readings also and she is not taking inadequate insulin and on time to control this She wants to improve her compliance and diet as well as regular exercise and come back in 3 months for reassessment  HYPERTENSION: controlled,  better with restarting Invokana   Hypothyroidism: TSH to be followed up on the next visit again   Patient  Instructions  Must take insulin at lunch and supper  Check blood sugars on waking up  4/7  Also check blood sugars about 2 hours after a meal and do this after different meals by rotation  Recommended blood sugar levels on waking up is 90-130 and about 2 hours after meal is 130-160  Please bring your blood sugar monitor to each visit, thank you       Counseling time on subjects discussed above is over 50% of today's 25 minute visit   [name] 10/22/2016, 8:59 PM   Note: This office note was prepared with Dragon voice recognition system technology. Any transcriptional errors that result from this process are unintentional.

## 2016-10-20 NOTE — Patient Instructions (Addendum)
Must take insulin at lunch and supper  Check blood sugars on waking up  4/7  Also check blood sugars about 2 hours after a meal and do this after different meals by rotation  Recommended blood sugar levels on waking up is 90-130 and about 2 hours after meal is 130-160  Please bring your blood sugar monitor to each visit, thank you

## 2016-10-23 ENCOUNTER — Other Ambulatory Visit: Payer: Self-pay | Admitting: Endocrinology

## 2016-11-03 ENCOUNTER — Other Ambulatory Visit: Payer: Self-pay | Admitting: Endocrinology

## 2016-11-09 DIAGNOSIS — E113312 Type 2 diabetes mellitus with moderate nonproliferative diabetic retinopathy with macular edema, left eye: Secondary | ICD-10-CM | POA: Diagnosis not present

## 2016-11-09 DIAGNOSIS — H35033 Hypertensive retinopathy, bilateral: Secondary | ICD-10-CM | POA: Diagnosis not present

## 2016-11-09 DIAGNOSIS — E113211 Type 2 diabetes mellitus with mild nonproliferative diabetic retinopathy with macular edema, right eye: Secondary | ICD-10-CM | POA: Diagnosis not present

## 2016-11-09 DIAGNOSIS — H43813 Vitreous degeneration, bilateral: Secondary | ICD-10-CM | POA: Diagnosis not present

## 2016-11-14 ENCOUNTER — Other Ambulatory Visit: Payer: Self-pay

## 2016-11-14 ENCOUNTER — Other Ambulatory Visit: Payer: Self-pay | Admitting: Endocrinology

## 2016-11-14 MED ORDER — ROSUVASTATIN CALCIUM 20 MG PO TABS: ORAL_TABLET | ORAL | 2 refills | Status: DC

## 2016-11-15 ENCOUNTER — Other Ambulatory Visit: Payer: Self-pay

## 2016-11-15 MED ORDER — ROSUVASTATIN CALCIUM 20 MG PO TABS: ORAL_TABLET | ORAL | 2 refills | Status: DC

## 2016-11-16 ENCOUNTER — Encounter: Payer: Medicare Other | Admitting: Dietician

## 2016-11-29 ENCOUNTER — Encounter (INDEPENDENT_AMBULATORY_CARE_PROVIDER_SITE_OTHER): Payer: Self-pay

## 2016-11-29 ENCOUNTER — Encounter: Payer: Self-pay | Admitting: Nurse Practitioner

## 2016-11-29 ENCOUNTER — Ambulatory Visit (INDEPENDENT_AMBULATORY_CARE_PROVIDER_SITE_OTHER): Payer: Medicare Other | Admitting: Nurse Practitioner

## 2016-11-29 VITALS — BP 116/70 | HR 73 | Ht 63.5 in | Wt 172.2 lb

## 2016-11-29 DIAGNOSIS — I10 Essential (primary) hypertension: Secondary | ICD-10-CM | POA: Diagnosis not present

## 2016-11-29 DIAGNOSIS — Z952 Presence of prosthetic heart valve: Secondary | ICD-10-CM | POA: Diagnosis not present

## 2016-11-29 DIAGNOSIS — I259 Chronic ischemic heart disease, unspecified: Secondary | ICD-10-CM

## 2016-11-29 DIAGNOSIS — E118 Type 2 diabetes mellitus with unspecified complications: Secondary | ICD-10-CM

## 2016-11-29 DIAGNOSIS — Z951 Presence of aortocoronary bypass graft: Secondary | ICD-10-CM | POA: Diagnosis not present

## 2016-11-29 DIAGNOSIS — E1165 Type 2 diabetes mellitus with hyperglycemia: Secondary | ICD-10-CM | POA: Diagnosis not present

## 2016-11-29 LAB — BASIC METABOLIC PANEL
BUN/Creatinine Ratio: 16 (ref 12–28)
BUN: 16 mg/dL (ref 8–27)
CO2: 25 mmol/L (ref 20–29)
Calcium: 9.8 mg/dL (ref 8.7–10.3)
Chloride: 95 mmol/L — ABNORMAL LOW (ref 96–106)
Creatinine, Ser: 0.98 mg/dL (ref 0.57–1.00)
GFR calc Af Amer: 63 mL/min/{1.73_m2} (ref >59)
GFR calc non Af Amer: 54 mL/min/{1.73_m2} — ABNORMAL LOW (ref >59)
Glucose: 113 mg/dL — ABNORMAL HIGH (ref 65–99)
Potassium: 4.5 mmol/L (ref 3.5–5.2)
Sodium: 140 mmol/L (ref 134–144)

## 2016-11-29 LAB — HEPATIC FUNCTION PANEL
ALT: 11 IU/L (ref 0–32)
AST: 21 IU/L (ref 0–40)
Albumin: 4.6 g/dL (ref 3.5–4.7)
Alkaline Phosphatase: 42 IU/L (ref 39–117)
Bilirubin Total: 0.4 mg/dL (ref 0.0–1.2)
Bilirubin, Direct: 0.13 mg/dL (ref 0.00–0.40)
Total Protein: 7.7 g/dL (ref 6.0–8.5)

## 2016-11-29 LAB — CBC
Hematocrit: 31.3 % — ABNORMAL LOW (ref 34.0–46.6)
Hemoglobin: 10.5 g/dL — ABNORMAL LOW (ref 11.1–15.9)
MCH: 31.2 pg (ref 26.6–33.0)
MCHC: 33.5 g/dL (ref 31.5–35.7)
MCV: 93 fL (ref 79–97)
Platelets: 244 10*3/uL (ref 150–379)
RBC: 3.37 x10E6/uL — ABNORMAL LOW (ref 3.77–5.28)
RDW: 13.8 % (ref 12.3–15.4)
WBC: 4.7 10*3/uL (ref 3.4–10.8)

## 2016-11-29 LAB — LIPID PANEL
Chol/HDL Ratio: 2.5 ratio (ref 0.0–4.4)
Cholesterol, Total: 140 mg/dL (ref 100–199)
HDL: 55 mg/dL (ref >39)
LDL Calculated: 68 mg/dL (ref 0–99)
Triglycerides: 84 mg/dL (ref 0–149)
VLDL Cholesterol Cal: 17 mg/dL (ref 5–40)

## 2016-11-29 NOTE — Progress Notes (Signed)
CARDIOLOGY OFFICE NOTE  Date:  11/29/2016    [name] Date of Birth: 1933-04-01 Medical Record #[id number]  PCP:  Rogers Blocker, MD  Cardiologist:  [name]   Chief Complaint  Patient presents with  . Coronary Artery Disease    Follow up visit - seen for Dr. Acie Fredrickson    History of Present Illness: [name] is a 83 y.o. female who presents today for a follow up visit. Seen for Dr. Acie Fredrickson. She now primarily follows with me.   She has a history of DM, prior AVR and CABG x 2 per EBG back in 2014 - this was with a LIMA to the LAD and SVG to the LCX, with a pericardial Edwards tissue valve #21 mm. Did have post op atrial fib - was on amiodarone. Other issues include HLD, HTN, DM, depression and hypothyroidism. Myoview ok from 06/2013. Last echo 12/2014.   I saw her back in September of 2016 for a work in visit - she was short of breath. Labs looked ok. Blood count actually improved. Echo updated - did have elevated filling pressures but otherwise unchanged. Prosthetic AV ok. Normal EF and had grade 1 diastolic dysfunction. I wanted to put her on low dose Lasix but she did not wish to proceed due to having frequent urination when on Invokana.   Seen in November of 2016 by me and she was doing ok- not really clear as to why she had improved but she had. She had talked with Dr. Dwyane Dee and he also encouraged prn diuretic. She was agreeable to trying. Last seen back in [place] she was doing well - using prn diuretics. BP had been running lower and her Plendil was cut back.   Seen several times back since - BP still low and Plendil had been stopped altogether. She has tried to work on losing weight. Does have some chronic fatigue issues.   Last seen back in March and she was doing ok.   Back here today. Here with her sister - they live together. She notes she has good days and bad days. Admits she is depressed. Not exercising as much. Depressed over life stuff. Sugars  are up. No real chest pain. Her breathing is good. BP is great. Feels ok on her medicines. She understands the need for SBE - but has not really been to the dentist. Other than the depression, she seems to be doing ok.   Past Medical History:  Diagnosis Date  . Aortic valve disorders    S/P AVR 06/2012  . Arthritis   . Asthma    AS CHILD   . Depression   . Diabetes mellitus   . Heart murmur   . Hyperlipidemia   . Hypertension   . Hypothyroidism   . Ischemic heart disease    with prior CABG x 2 in 2014 along with AVR  . Lumbago   . Shortness of breath   . Thyroid disease     Past Surgical History:  Procedure Laterality Date  . AORTIC VALVE REPLACEMENT N/A 06/25/2012   Procedure: AORTIC VALVE REPLACEMENT (AVR);  Surgeon: Grace Isaac, MD;  Location: [facility];  Service: Open Heart Surgery;  Laterality: N/A;  . BREAST SURGERY     LT BREAST BX BENIGN  . CORONARY ARTERY BYPASS GRAFT N/A 06/25/2012   Procedure: Coronary artery bypass graft  times two using left internal mammary artery and right leg greater saphenous vein;  Surgeon: Percell Miller  Maryruth Bun, MD;  Location: [facility];  Service: Open Heart Surgery;  Laterality: N/A;  . INTRAOPERATIVE TRANSESOPHAGEAL ECHOCARDIOGRAM N/A 06/25/2012   Procedure: INTRAOPERATIVE TRANSESOPHAGEAL ECHOCARDIOGRAM;  Surgeon: Grace Isaac, MD;  Location: [facility];  Service: Open Heart Surgery;  Laterality: N/A;  . TUBAL LIGATION       Medications: Current Meds  Medication Sig  . aspirin EC 81 MG tablet Take 1 tablet (81 mg total) by mouth daily.  . B-D UF III MINI PEN NEEDLES 31G X 5 MM MISC USE 3 PER DAY  . benazepril (LOTENSIN) 20 MG tablet Take by mouth daily. Take 1/2 tablet by mouth daily  . calcium carbonate (TUMS) 500 MG chewable tablet Chew 1 tablet by mouth daily as needed for heartburn (indigestion).  . carvedilol (COREG) 12.5 MG tablet TAKE 1 TABLET (12.5 MG TOTAL) BY MOUTH 2 (TWO) TIMES DAILY.  . cholecalciferol (VITAMIN D) 1000 UNITS tablet  Take 1,000 Units by mouth daily.  . Coenzyme Q10 (CO Q-10) 100 MG CAPS Take 1 capsule by mouth 2 (two) times daily.  . furosemide (LASIX) 20 MG tablet TAKE 1 TABLET (20 MG TOTAL) BY MOUTH DAILY AS NEEDED FOR FLUID OR EDEMA (OR WEIGHT GAIN).  Marland Kitchen glucose blood (ONETOUCH VERIO) test strip Use to test blood sugar 3 times daily Dx code E11.65  . insulin lispro (HUMALOG) 100 UNIT/ML KiwkPen Inject 0.06-0.08 mLs (6-8 Units total) into the skin 2 (two) times daily.  . INVOKAMET 50-1000 MG TABS TAKE 1 TABLET BY MOUTH TWICE DAILY  . INVOKAMET 50-1000 MG TABS TAKE 1 TABLET BY MOUTH TWICE DAILY  . levothyroxine (SYNTHROID, LEVOTHROID) 75 MCG tablet TAKE 1 TABLET BY MOUTH DAILY  . metFORMIN (GLUCOPHAGE-XR) 750 MG 24 hr tablet TAKE 2 TABLETS BY MOUTH DAILY  . Multiple Vitamins-Minerals (MULTIVITAMINS THER. W/MINERALS) TABS Take 1 tablet by mouth daily.  . rosuvastatin (CRESTOR) 20 MG tablet TAKE 1 TABLET(20 MG) BY MOUTH DAILY  . TOUJEO SOLOSTAR 300 UNIT/ML SOPN 22 Units daily.      Allergies: No Known Allergies  Social History: The patient  reports that she has never smoked. She has never used smokeless tobacco. She reports that she does not drink alcohol or use drugs.   Family History: The patient's family history includes Heart disease in her father.   Review of Systems: Please see the history of present illness.   Otherwise, the review of systems is positive for none.   All other systems are reviewed and negative.   Physical Exam: VS:  BP 116/70 (BP Location: Left Arm, Patient Position: Sitting, Cuff Size: Large)   Pulse 73   Ht 5' 3.5" (1.613 m)   Wt 172 lb 3.2 oz (78.1 kg)   SpO2 100% Comment: at rest  BMI 30.03 kg/m  .  BMI Body mass index is 30.03 kg/m.  Wt Readings from Last 3 Encounters:  11/29/16 172 lb 3.2 oz (78.1 kg)  10/20/16 170 lb (77.1 kg)  07/26/16 172 lb 12.8 oz (78.4 kg)    General: Pleasant. Well developed, well nourished and in no acute distress.   HEENT: Normal.    Neck: Supple, no JVD, carotid bruits, or masses noted.  Cardiac: Regular rate and rhythm. No murmurs, rubs, or gallops. No edema.  Respiratory:  Lungs are clear to auscultation bilaterally with normal work of breathing.  GI: Soft and nontender.  MS: No deformity or atrophy. Gait and ROM intact.  Skin: Warm and dry. Color is normal.  Neuro:  Strength and sensation  are intact and no gross focal deficits noted.  Psych: Alert, appropriate and with normal affect.   LABORATORY DATA:  EKG:  EKG is not ordered today.  Lab Results  Component Value Date   WBC 5.1 02/08/2016   HGB 11.3 (L) 02/08/2016   HCT 34.1 (L) 02/08/2016   PLT 232 02/08/2016   GLUCOSE 132 (H) 10/17/2016   CHOL 123 04/04/2016   TRIG 54.0 04/04/2016   HDL 48.90 04/04/2016   LDLDIRECT 102.8 11/28/2013   LDLCALC 64 04/04/2016   ALT 11 07/17/2016   AST 21 07/17/2016   NA 138 10/17/2016   K 3.9 10/17/2016   CL 101 10/17/2016   CREATININE 0.96 10/17/2016   BUN 22 10/17/2016   CO2 30 10/17/2016   TSH 1.45 07/17/2016   INR 1.57 (H) 06/25/2012   HGBA1C 9.0 (H) 10/17/2016   MICROALBUR 48.8 (H) 04/04/2016     BNP (last 3 results) No results for input(s): BNP in the last 8760 hours.  ProBNP (last 3 results) No results for input(s): PROBNP in the last 8760 hours.   Other Studies Reviewed Today:  Echo Study Conclusions from 12/2014  - Left ventricle: The cavity size was normal. There was mild focal basal hypertrophy of the septum. Systolic function was normal. The estimated ejection fraction was in the range of 55% to 60%. Wall motion was normal; there were no regional wall motion abnormalities. There was an increased relative contribution of atrial contraction to ventricular filling. Doppler parameters are consistent with abnormal left ventricular relaxation (grade 1 diastolic dysfunction). Doppler parameters are consistent with high ventricular filling pressure. - Aortic valve: Poorly  visualized. A bioprosthesis was present and functioning normally. Peak velocity (S): 248 cm/s. Mean gradient (S): 12 mm Hg. Valve area (VTI): 0.94 cm^2. Valve area (Vmax): 0.97 cm^2. Valve area (Vmean): 0.95 cm^2. - Mitral valve: Calcified annulus. Mild diffuse thickening. There was mild regurgitation. - Left atrium: The atrium was mildly dilated. - Pulmonic valve: There was trivial regurgitation.   Myoview Impression from 06/2013 Exercise Capacity: Lexiscan with no exercise. BP Response: Normal blood pressure response. Clinical Symptoms: No significant symptoms noted. ECG Impression: There are scattered PVCs. Comparison with Prior Nuclear Study: No previous nuclear study performed  Overall Impression: Low risk stress nuclear study small fixed distal anterior artifact which could be breast attenuation or other artifact.  LV Ejection Fraction: 66%. LV Wall Motion: Normal Wall Motion  [name], MD, [facility]    Assessment/Plan:  1. CAD - s/p CABG 06-27-12 -last Myoview stable from 2015. No active chest pain.Would continue with CV risk factor modification. Needs to get back to walking.   2. Aortic stenosis with prior AVR - echo from 2016 wasok. Did have elevated filling pressures - Now using low dose Lasix prn. She has improved clinically. Will ger her echo updated - reminded about SBE  3. Post op atrial fibrillation - remains in NSR by exam   4. HTN - BP is great here today - no changes made.    5. Obesity- needs to get back to exercise.    6. Fatigue - most likely multifactorial - probably more related to depression  7. Diabetes - uncontrolled based on most recent A1C  Current medicines are reviewed with the patient today.  The patient does not have concerns regarding medicines other than what has been noted above.  The following changes have been made:  See above.  Labs/ tests ordered today include:    Orders Placed This Encounter  Procedures  . Basic metabolic panel  . CBC  . Hepatic function panel  . Lipid panel  . ECHOCARDIOGRAM COMPLETE     Disposition:   FU with me in 4 months.   Patient is agreeable to this plan and will call if any problems develop in the interim.   SignedTruitt Merle, NP  11/29/2016 8:59 AM  [facility] [address] [place], [place]  [address] Phone: [phone] Fax: [phone]

## 2016-11-29 NOTE — Patient Instructions (Addendum)
We will be checking the following labs today - BMET, CBC, HPF and lipids   Medication Instructions:    Continue with your current medicines.     Testing/Procedures To Be Arranged:  Echocardiogram  Follow-Up:   See me in 4 months    Other Special Instructions:   N/A    If you need a refill on your cardiac medications before your next appointment, please call your pharmacy.   Call the [facility] office at [phone] if you have any questions, problems or concerns.

## 2016-12-01 DIAGNOSIS — E119 Type 2 diabetes mellitus without complications: Secondary | ICD-10-CM | POA: Diagnosis not present

## 2016-12-01 DIAGNOSIS — N390 Urinary tract infection, site not specified: Secondary | ICD-10-CM | POA: Diagnosis not present

## 2016-12-01 DIAGNOSIS — I10 Essential (primary) hypertension: Secondary | ICD-10-CM | POA: Diagnosis not present

## 2016-12-01 DIAGNOSIS — A499 Bacterial infection, unspecified: Secondary | ICD-10-CM | POA: Diagnosis not present

## 2016-12-05 ENCOUNTER — Ambulatory Visit (HOSPITAL_COMMUNITY): Payer: Medicare Other | Attending: Cardiology

## 2016-12-05 ENCOUNTER — Other Ambulatory Visit: Payer: Self-pay

## 2016-12-05 DIAGNOSIS — E119 Type 2 diabetes mellitus without complications: Secondary | ICD-10-CM | POA: Insufficient documentation

## 2016-12-05 DIAGNOSIS — D509 Iron deficiency anemia, unspecified: Secondary | ICD-10-CM | POA: Diagnosis not present

## 2016-12-05 DIAGNOSIS — I10 Essential (primary) hypertension: Secondary | ICD-10-CM | POA: Diagnosis not present

## 2016-12-05 DIAGNOSIS — Z952 Presence of prosthetic heart valve: Secondary | ICD-10-CM

## 2016-12-05 DIAGNOSIS — E039 Hypothyroidism, unspecified: Secondary | ICD-10-CM | POA: Diagnosis not present

## 2016-12-05 DIAGNOSIS — E785 Hyperlipidemia, unspecified: Secondary | ICD-10-CM | POA: Diagnosis not present

## 2016-12-05 DIAGNOSIS — D510 Vitamin B12 deficiency anemia due to intrinsic factor deficiency: Secondary | ICD-10-CM | POA: Diagnosis not present

## 2016-12-05 DIAGNOSIS — Z953 Presence of xenogenic heart valve: Secondary | ICD-10-CM | POA: Diagnosis not present

## 2016-12-05 DIAGNOSIS — I081 Rheumatic disorders of both mitral and tricuspid valves: Secondary | ICD-10-CM | POA: Insufficient documentation

## 2016-12-05 DIAGNOSIS — I2510 Atherosclerotic heart disease of native coronary artery without angina pectoris: Secondary | ICD-10-CM | POA: Diagnosis not present

## 2016-12-08 DIAGNOSIS — E119 Type 2 diabetes mellitus without complications: Secondary | ICD-10-CM | POA: Diagnosis not present

## 2016-12-08 DIAGNOSIS — N390 Urinary tract infection, site not specified: Secondary | ICD-10-CM | POA: Diagnosis not present

## 2016-12-08 DIAGNOSIS — D649 Anemia, unspecified: Secondary | ICD-10-CM | POA: Diagnosis not present

## 2016-12-08 DIAGNOSIS — I10 Essential (primary) hypertension: Secondary | ICD-10-CM | POA: Diagnosis not present

## 2016-12-20 ENCOUNTER — Encounter: Payer: Self-pay | Admitting: Podiatry

## 2016-12-20 ENCOUNTER — Ambulatory Visit (INDEPENDENT_AMBULATORY_CARE_PROVIDER_SITE_OTHER): Payer: Medicare Other | Admitting: Podiatry

## 2016-12-20 DIAGNOSIS — E1151 Type 2 diabetes mellitus with diabetic peripheral angiopathy without gangrene: Secondary | ICD-10-CM

## 2016-12-20 DIAGNOSIS — M79676 Pain in unspecified toe(s): Secondary | ICD-10-CM

## 2016-12-20 DIAGNOSIS — B351 Tinea unguium: Secondary | ICD-10-CM

## 2016-12-20 DIAGNOSIS — L84 Corns and callosities: Secondary | ICD-10-CM

## 2016-12-20 NOTE — Progress Notes (Signed)
Patient ID: [name], female   DOB: [birth year], 83 y.o.   MRN: [id number]     Subjective: This patient presents for scheduled visit complaining of painful toenails when walking wearing shoes and requests nail debridement  Objective: DP pulses 2/4 bilaterally PT pulses 1/4 bilaterally Capillary reflex delayed bilaterally Sensation to 10 g monofilament wire intact 4/5 right and 5/5 left Vibratory sensation reactive bilaterally HAV left Ankle reflexes equal and reactive bilaterally No open skin lesions bilaterally Absent hair growth and atrophic skin, bilaterally Hammertoe second bilaterally Plantar callus subsecond MPJ right The toenails are elongated, brittle, discolored, incurvated and tender to direct palpation 6-10 Corn second and fifth right toesand fifth left Pes planus bilaterally Manual motor testing dorsi flexion, plantar flexion 5/5 bilaterally  Assessment: Diabetic with peripheral arterial disease Symptomatic onychomycoses 6-10 keratoses 4  Plan: Debrided toenails 10 and mechanically and electrically without any bleeding Debride keratoses 4without any bleeding  Reappoint 3 months

## 2016-12-20 NOTE — Patient Instructions (Signed)

## 2017-01-04 ENCOUNTER — Telehealth: Payer: Self-pay | Admitting: Endocrinology

## 2017-01-04 ENCOUNTER — Other Ambulatory Visit: Payer: Self-pay | Admitting: Endocrinology

## 2017-01-04 ENCOUNTER — Other Ambulatory Visit: Payer: Self-pay

## 2017-01-04 MED ORDER — INSULIN GLARGINE 300 UNIT/ML ~~LOC~~ SOPN: 24.0000 [IU] | PEN_INJECTOR | Freq: Every day | SUBCUTANEOUS | 5 refills | Status: DC

## 2017-01-04 NOTE — Telephone Encounter (Signed)
Patient wants to cancel the refill request, she thinks that it is faster to have the pharmacy call the office to ask. Office on stand by for pharmacy request

## 2017-01-04 NOTE — Telephone Encounter (Signed)
MEDICATION: TOUJEO SOLOSTAR 300 UNIT/ML SOPN  PHARMACY:  [facility] [place], [place] [phone] (Phone) [phone] (Fax)       IS THIS A 90 DAY SUPPLY : N  IS PATIENT OUT OF MEDICATION: N  IF NOT; HOW MUCH IS LEFT: Enough for tomorrow morning per patient  LAST APPOINTMENT DATE: 10/20/16  NEXT APPOINTMENT DATE: 01/26/17  OTHER COMMENTS:    **Let patient know to contact pharmacy at the end of the day to make sure medication is ready. **  ** Please notify patient to allow 48-72 hours to process**  **Encourage patient to contact the pharmacy for refills or they can request refills through [facility]**

## 2017-01-04 NOTE — Telephone Encounter (Signed)
Called patient and let her know that this has already been filled and sent to the pharmacy for her. She stated that she has already picked the Toujeo up. She stated that it was $137 and she needed something cheaper. I advised to her that she could call her insurance and request what is compatible and let us know if she has a more affordable option.

## 2017-01-09 ENCOUNTER — Telehealth: Payer: Self-pay | Admitting: Endocrinology

## 2017-01-09 ENCOUNTER — Other Ambulatory Visit: Payer: Self-pay

## 2017-01-09 MED ORDER — INSULIN LISPRO 100 UNIT/ML (KWIKPEN): 6.0000 [IU] | PEN_INJECTOR | Freq: Two times a day (BID) | SUBCUTANEOUS | 3 refills | Status: DC

## 2017-01-09 NOTE — Telephone Encounter (Signed)
Patient called to check the status of the note below, I advised her that it was placed as a high priority however, has not been seen yet and the pharmacy will call once it is ready.

## 2017-01-09 NOTE — Telephone Encounter (Signed)
MEDICATION: insulin lispro (HUMALOG) 100 UNIT/ML KiwkPen  PHARMACY:   [facility] - [place],  - [place] [phone] (Phone) [phone] (Fax)     IS THIS A 90 DAY SUPPLY : N/A  IS PATIENT OUT OF MEDICATION: Y  IF NOT; HOW MUCH IS LEFT:   LAST APPOINTMENT DATE: 10/20/16  NEXT APPOINTMENT DATE: 01/26/17  OTHER COMMENTS:    **Let patient know to contact pharmacy at the end of the day to make sure medication is ready. **  ** Please notify patient to allow 48-72 hours to process**  **Encourage patient to contact the pharmacy for refills or they can request refills through [facility]**

## 2017-01-15 ENCOUNTER — Ambulatory Visit (HOSPITAL_COMMUNITY)
Admission: EM | Admit: 2017-01-15 | Discharge: 2017-01-15 | Disposition: A | Discharge status: Home / Self Care | Payer: Medicare Other | Attending: Family Medicine | Admitting: Family Medicine

## 2017-01-15 ENCOUNTER — Encounter (HOSPITAL_COMMUNITY): Payer: Self-pay | Admitting: Emergency Medicine

## 2017-01-15 DIAGNOSIS — M7912 Myalgia of auxiliary muscles, head and neck: Secondary | ICD-10-CM

## 2017-01-15 DIAGNOSIS — M791 Myalgia: Secondary | ICD-10-CM
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
## 2017-01-15 NOTE — Discharge Instructions (Signed)
Apply heat to the right side of the neck. Perform gentle stretches as demonstrated. May take ibuprofen every 6 hours as needed.

## 2017-01-15 NOTE — ED Provider Notes (Signed)
[facility]    CSN: [id number] Arrival date & time: 01/15/17  1438     History   Chief Complaint Chief Complaint  Patient presents with  . Gastroesophageal Reflux    HPI Brayleigh MICHALA DEBLANC is a 83 y.o. female.   83 year old female presents to the urgent care with an initial complaint of indigestion. When asked to describe and point to the area of discomfort she points to a specific area to the right lateral neck. She denies any discomfort of the anterior neck or substernal area. She denies reflux, acid burning or other GERD type symptoms. It is not affected by eating or drinking. She is able to handle her secretions, no drooling. No pain or discomfort with swallowing. She took a Tums and she thought she felt a little better. She has no stomach upset, nausea or vomiting. She states it often happens when she has to swallow a large tablet.      Past Medical History:  Diagnosis Date  . Aortic valve disorders    S/P AVR 06/2012  . Arthritis   . Asthma    AS CHILD   . Depression   . Diabetes mellitus   . Heart murmur   . Hyperlipidemia   . Hypertension   . Hypothyroidism   . Ischemic heart disease    with prior CABG x 2 in 2014 along with AVR  . Lumbago   . Shortness of breath   . Thyroid disease     Patient Active Problem List   Diagnosis Date Noted  . Overactive bladder 10/27/2015  . S/P CABG (coronary artery bypass graft) 07/16/2012  . Type II diabetes mellitus, uncontrolled ([place]) 06/17/2012  . Essential hypertension 05/20/2012  . Hyperlipidemia 05/20/2012  . Hypothyroidism 05/20/2012  . Aortic stenosis 05/10/2011    Past Surgical History:  Procedure Laterality Date  . AORTIC VALVE REPLACEMENT N/A 06/25/2012   Procedure: AORTIC VALVE REPLACEMENT (AVR);  Surgeon: Grace Isaac, MD;  Location: [facility];  Service: Open Heart Surgery;  Laterality: N/A;  . BREAST SURGERY     LT BREAST BX BENIGN  . CORONARY ARTERY BYPASS GRAFT N/A 06/25/2012   Procedure:  Coronary artery bypass graft  times two using left internal mammary artery and right leg greater saphenous vein;  Surgeon: Grace Isaac, MD;  Location: [facility];  Service: Open Heart Surgery;  Laterality: N/A;  . INTRAOPERATIVE TRANSESOPHAGEAL ECHOCARDIOGRAM N/A 06/25/2012   Procedure: INTRAOPERATIVE TRANSESOPHAGEAL ECHOCARDIOGRAM;  Surgeon: Grace Isaac, MD;  Location: [facility];  Service: Open Heart Surgery;  Laterality: N/A;  . TUBAL LIGATION      OB History    No data available       Home Medications    Prior to Admission medications   Medication Sig Start Date End Date Taking? Authorizing Provider  aspirin EC 81 MG tablet Take 1 tablet (81 mg total) by mouth daily. 09/10/13   Nahser, Wonda Cheng, MD  B-D UF III MINI PEN NEEDLES 31G X 5 MM MISC USE 3 PER DAY 10/23/16   Elayne Snare, MD  benazepril (LOTENSIN) 20 MG tablet Take by mouth daily. Take 1/2 tablet by mouth daily    [provider]  calcium carbonate (TUMS) 500 MG chewable tablet Chew 1 tablet by mouth daily as needed for heartburn (indigestion).    [provider]  carvedilol (COREG) 12.5 MG tablet TAKE 1 TABLET (12.5 MG TOTAL) BY MOUTH 2 (TWO) TIMES DAILY. 04/25/16   Burtis Junes, NP  cholecalciferol (VITAMIN D) 1000 UNITS tablet Take 1,000 Units by mouth daily.    [provider]  Coenzyme Q10 (CO Q-10) 100 MG CAPS Take 1 capsule by mouth 2 (two) times daily.    [provider]  furosemide (LASIX) 20 MG tablet TAKE 1 TABLET (20 MG TOTAL) BY MOUTH DAILY AS NEEDED FOR FLUID OR EDEMA (OR WEIGHT GAIN). 10/06/16   Burtis Junes, NP  glucose blood (ONETOUCH VERIO) test strip Use to test blood sugar 3 times daily Dx code E11.65 07/20/16   Elayne Snare, MD  Insulin Glargine (TOUJEO SOLOSTAR) 300 UNIT/ML SOPN Inject 24 Units into the skin daily. 01/04/17   Elayne Snare, MD  insulin lispro (HUMALOG) 100 UNIT/ML KiwkPen Inject 0.06-0.08 mLs (6-8 Units total) into the skin 2 (two) times daily. 01/09/17    Elayne Snare, MD  INVOKAMET 50-1000 MG TABS TAKE 1 TABLET BY MOUTH TWICE DAILY 11/14/16   Elayne Snare, MD  INVOKAMET 50-1000 MG TABS TAKE 1 TABLET BY MOUTH TWICE DAILY 11/14/16   Elayne Snare, MD  levothyroxine (SYNTHROID, LEVOTHROID) 75 MCG tablet TAKE 1 TABLET BY MOUTH DAILY 09/18/16   Elayne Snare, MD  metFORMIN (GLUCOPHAGE-XR) 750 MG 24 hr tablet TAKE 2 TABLETS BY MOUTH DAILY 11/03/16   Elayne Snare, MD  Multiple Vitamins-Minerals (MULTIVITAMINS THER. W/MINERALS) TABS Take 1 tablet by mouth daily.    [provider]  rosuvastatin (CRESTOR) 20 MG tablet TAKE 1 TABLET(20 MG) BY MOUTH DAILY 11/15/16   Elayne Snare, MD    Family History Family History  Problem Relation Age of Onset  . Heart disease Father     Social History Social History  Substance Use Topics  . Smoking status: Never Smoker  . Smokeless tobacco: Never Used  . Alcohol use No     Allergies   Patient has no known allergies.   Review of Systems Review of Systems  Constitutional: Negative.   HENT: Negative for congestion, drooling, ear pain, facial swelling, postnasal drip, rhinorrhea, sinus pain, sore throat, trouble swallowing and voice change.   Respiratory: Negative.  Negative for cough, choking and shortness of breath.   Cardiovascular: Negative.   Gastrointestinal: Negative.   Musculoskeletal: Negative.   Neurological: Negative.   All other systems reviewed and are negative.    Physical Exam Triage Vital Signs ED Triage Vitals [01/15/17 1525]  Enc Vitals Group     BP (!) 150/57     Pulse Rate 71     Resp 20     Temp 98.3 F (36.8 C)     Temp Source Oral     SpO2 98 %     Weight      Height      Head Circumference      Peak Flow      Pain Score      Pain Loc      Pain Edu?      Excl. in [place]?    No data found.   Updated Vital Signs BP (!) 150/57 (BP Location: Left Arm) Comment (BP Location): large cuff  Pulse 71   Temp 98.3 F (36.8 C) (Oral)   Resp 20   SpO2 98%   Visual  Acuity Right Eye Distance:   Left Eye Distance:   Bilateral Distance:    Right Eye Near:   Left Eye Near:    Bilateral Near:     Physical Exam  Constitutional: She is oriented to person, place, and time. She appears well-developed and well-nourished. No distress.  HENT:  Head: Normocephalic and atraumatic.  Patient points to a specific area of discomfort to the lower right lateral neck. She states this is the area of discomfort for which she presents to the urgent care. Palpation of this area reveals the sternocleidomastoid muscle and tenderness. Having the patient turn her head to the left and then tilt to the left also reproduces the discomfort. The muscle is well palpated and there is tenderness along most of the length of the SCM.  Eyes: EOM are normal.  Neck: Normal range of motion. Neck supple.  Cardiovascular: Normal rate.   Pulmonary/Chest: Effort normal.  Musculoskeletal: Normal range of motion.  Lymphadenopathy:    She has no cervical adenopathy.  Neurological: She is alert and oriented to person, place, and time.  Skin: Skin is warm and dry.  Psychiatric: She has a normal mood and affect.  Nursing note and vitals reviewed.    UC Treatments / Results  Labs (all labs ordered are listed, but only abnormal results are displayed) Labs Reviewed - No data to display  EKG  EKG Interpretation None       Radiology No results found.  Procedures Procedures (including critical care time)  Medications Ordered in UC Medications - No data to display   Initial Impression / Assessment and Plan / UC Course  I have reviewed the triage vital signs and the nursing notes.  Pertinent labs & imaging results that were available during my care of the patient were reviewed by me and considered in my medical decision making (see chart for details).    Apply heat to the right side of the neck. Perform gentle stretches as demonstrated. May take ibuprofen every 6 hours as  needed.  There is no evidence in the history or exam that suggests this is reflux, esophagitis, GERD or indigestion. She has no chest discomfort whatsoever. The reproduced discomfort with manual manipulation of the right SCM as well as turning the head to the left and tilting reproducing the pain reveals pain in the right SCM.   Final Clinical Impressions(s) / UC Diagnoses   Final diagnoses:  Sternocleidomastoid muscle tenderness    New Prescriptions New Prescriptions   No medications on file     Controlled Substance Prescriptions [place] Controlled Substance Registry consulted? Not Applicable   Janne Napoleon, NP 01/15/17 [id number]

## 2017-01-22 ENCOUNTER — Other Ambulatory Visit (INDEPENDENT_AMBULATORY_CARE_PROVIDER_SITE_OTHER): Payer: Medicare Other

## 2017-01-22 DIAGNOSIS — Z794 Long term (current) use of insulin: Secondary | ICD-10-CM

## 2017-01-22 DIAGNOSIS — E1165 Type 2 diabetes mellitus with hyperglycemia: Secondary | ICD-10-CM

## 2017-01-22 LAB — TSH: TSH: 0.92 u[IU]/mL (ref 0.35–4.50)

## 2017-01-22 LAB — HEMOGLOBIN A1C: HEMOGLOBIN A1C: 8.1 % — AB (ref 4.6–6.5)

## 2017-01-26 ENCOUNTER — Encounter: Payer: Self-pay | Admitting: Endocrinology

## 2017-01-26 ENCOUNTER — Ambulatory Visit (INDEPENDENT_AMBULATORY_CARE_PROVIDER_SITE_OTHER): Payer: Medicare Other | Admitting: Endocrinology

## 2017-01-26 VITALS — BP 148/86 | HR 73 | Ht 63.5 in | Wt 174.4 lb

## 2017-01-26 DIAGNOSIS — Z794 Long term (current) use of insulin: Secondary | ICD-10-CM | POA: Diagnosis not present

## 2017-01-26 DIAGNOSIS — E1165 Type 2 diabetes mellitus with hyperglycemia: Secondary | ICD-10-CM | POA: Diagnosis not present

## 2017-01-26 DIAGNOSIS — Z23 Encounter for immunization: Secondary | ICD-10-CM

## 2017-01-26 DIAGNOSIS — I259 Chronic ischemic heart disease, unspecified: Secondary | ICD-10-CM

## 2017-01-26 NOTE — Patient Instructions (Addendum)
More sugars after meals  Take upto 8 at lunch  Walk daily  Take full tab of Benazepril

## 2017-01-26 NOTE — Progress Notes (Signed)
[name] 83 y.o.            Reason for Appointment: Diabetes follow-up   History of Present Illness   Diagnosis: Type 2 DIABETES MELITUS, date of diagnosis:   2000   She has been on insulin since 2004 with difficulty controlling adequately because of compliance with diet and variability in blood sugars. Her previous records are not available at present, however she has been on basal insulin with mealtime coverage since at least 2007 She has been tried on Byetta previously but did not tolerate this well She tried Victoza but didn't not benefit from Victoza 0.6 dosage; had nausea from 1.2 mg; did not continue this because of this side effect She also was given a trial of the V-go pump but she subjectively did not like this  RECENT history:    INSULIN: Toujeo 22 units in morning, HUMALOG 6-8 acb, 6 lunch and 4 at supper     A1c is usually around 8%, it is now 9  She has the following blood sugar patterns and problems identified  Her A1c is better with movement in her readings after meals but not consistently  Again her main difficulty is getting inconsistent control of her readings after breakfast and lunch possibly from snacks in between meals  She does not increase her lunchtime dose for higher carbohydrate intake or larger meals  Sometimes again will still forget her insulin before eating when going out  FASTING readings are generally fairly stable and somewhat better than the last visit  No consistent pattern of high readings present with some good readings at all times  No hypoglycemia except once she had a 65 reading at bedtime  She likes to take her Invokamet only once a day because of increased urination with full dosage  She did have a recent infection treated by PCP recently, has not had those before  Oral hypoglycemic drugs: Metformin er 750 mg at supper, Invokamet 50/1000:   .         Monitors blood glucose:  about 3 times daily     Glucometer: One Touch  Verio.          Blood Glucose readings from download:    Mean values apply above for all meters except median for One Touch  PRE-MEAL Fasting Lunch Dinner Bedtime Overall  Glucose range: 93-198 118-368  121-342   65-3 91    Mean/median: 130 240  221  194  161+/-71    Meals: 3 meals per day:  breakfast is At 7 am;  lunch 12-2 pm, dinner 5 pm Intake is quite variable at different meals   Physical activity: exercise: Trying to walk upto 30 min; 1-3/7  days  Wt Readings from Last 3 Encounters:  01/26/17 174 lb 6.4 oz (79.1 kg)  11/29/16 172 lb 3.2 oz (78.1 kg)  10/20/16 170 lb (77.1 kg)    Lab Results  Component Value Date   HGBA1C 8.1 (H) 01/22/2017   HGBA1C 9.0 (H) 10/17/2016   HGBA1C 7.5 (H) 07/17/2016   Lab Results  Component Value Date   MICROALBUR 48.8 (H) 04/04/2016   LDLCALC 68 11/29/2016   CREATININE 0.98 11/29/2016    Lab on 01/22/2017  Component Date Value Ref Range Status  . Hgb A1c MFr Bld 01/22/2017 8.1* 4.6 - 6.5 % Final   Glycemic Control Guidelines for People with Diabetes:Non Diabetic:  <6%Goal of Therapy: <7%Additional Action Suggested:  >8%   . TSH 01/22/2017 0.92  0.35 -  4.50 uIU/mL Final     Allergies as of 01/26/2017   No Known Allergies     Medication List       Accurate as of 01/26/17 11:59 PM. Always use your most recent med list.          aspirin EC 81 MG tablet Take 1 tablet (81 mg total) by mouth daily.   B-D UF III MINI PEN NEEDLES 31G X 5 MM Misc Generic drug:  Insulin Pen Needle USE 3 PER DAY   benazepril 20 MG tablet Commonly known as:  LOTENSIN Take by mouth daily. Take 1/2 tablet by mouth daily   carvedilol 12.5 MG tablet Commonly known as:  COREG TAKE 1 TABLET (12.5 MG TOTAL) BY MOUTH 2 (TWO) TIMES DAILY.   cholecalciferol 1000 units tablet Commonly known as:  VITAMIN D Take 1,000 Units by mouth daily.   Co Q-10 100 MG Caps Take 1 capsule by mouth 2 (two) times daily.   furosemide 20 MG tablet Commonly known as:   LASIX TAKE 1 TABLET (20 MG TOTAL) BY MOUTH DAILY AS NEEDED FOR FLUID OR EDEMA (OR WEIGHT GAIN).   glucose blood test strip Commonly known as:  ONETOUCH VERIO Use to test blood sugar 3 times daily Dx code E11.65   Insulin Glargine 300 UNIT/ML Sopn Commonly known as:  TOUJEO SOLOSTAR Inject 24 Units into the skin daily.   insulin lispro 100 UNIT/ML KiwkPen Commonly known as:  HUMALOG Inject 0.06-0.08 mLs (6-8 Units total) into the skin 2 (two) times daily.   INVOKAMET 50-1000 MG Tabs Generic drug:  Canagliflozin-Metformin HCl TAKE 1 TABLET BY MOUTH TWICE DAILY   levothyroxine 75 MCG tablet Commonly known as:  SYNTHROID, LEVOTHROID TAKE 1 TABLET BY MOUTH DAILY   metFORMIN 750 MG 24 hr tablet Commonly known as:  GLUCOPHAGE-XR TAKE 2 TABLETS BY MOUTH DAILY   multivitamins ther. w/minerals Tabs tablet Take 1 tablet by mouth daily.   rosuvastatin 20 MG tablet Commonly known as:  CRESTOR TAKE 1 TABLET(20 MG) BY MOUTH DAILY   TUMS 500 MG chewable tablet Generic drug:  calcium carbonate Chew 1 tablet by mouth daily as needed for heartburn (indigestion).            Discharge Care Instructions        Start     Ordered   01/26/17 0000  Flu vaccine HIGH DOSE PF     01/26/17 1409      Allergies: No Known Allergies  Past Medical History:  Diagnosis Date  . Aortic valve disorders    S/P AVR 06/2012  . Arthritis   . Asthma    AS CHILD   . Depression   . Diabetes mellitus   . Heart murmur   . Hyperlipidemia   . Hypertension   . Hypothyroidism   . Ischemic heart disease    with prior CABG x 2 in 2014 along with AVR  . Lumbago   . Shortness of breath   . Thyroid disease     Past Surgical History:  Procedure Laterality Date  . AORTIC VALVE REPLACEMENT N/A 06/25/2012   Procedure: AORTIC VALVE REPLACEMENT (AVR);  Surgeon: Grace Isaac, MD;  Location: [facility];  Service: Open Heart Surgery;  Laterality: N/A;  . BREAST SURGERY     LT BREAST BX BENIGN  .  CORONARY ARTERY BYPASS GRAFT N/A 06/25/2012   Procedure: Coronary artery bypass graft  times two using left internal mammary artery and right leg greater saphenous vein;  Surgeon:  Grace Isaac, MD;  Location: [facility];  Service: Open Heart Surgery;  Laterality: N/A;  . INTRAOPERATIVE TRANSESOPHAGEAL ECHOCARDIOGRAM N/A 06/25/2012   Procedure: INTRAOPERATIVE TRANSESOPHAGEAL ECHOCARDIOGRAM;  Surgeon: Grace Isaac, MD;  Location: [facility];  Service: Open Heart Surgery;  Laterality: N/A;  . TUBAL LIGATION      Family History  Problem Relation Age of Onset  . Heart disease Father     Social History:  reports that she has never smoked. She has never used smokeless tobacco. She reports that she does not drink alcohol or use drugs.  Review of Systems:   HYPERTENSION: Blood pressure is usually followed by PCP However blood pressure appears to be consistently high recently  BP Readings from Last 3 Encounters:  01/26/17 (!) 148/86  01/15/17 (!) 150/57  11/29/16 116/70     HYPOTHYROIDISM: She has had long-standing hypothyroidism  She has had consistent TSH level with Levothyroxine 75 g dose    Lab Results  Component Value Date   TSH 0.92 01/22/2017   TSH 1.45 07/17/2016   TSH 1.74 02/02/2016   FREET4 1.13 06/02/2015   FREET4 1.07 03/11/2014   FREET4 1.01 09/25/2013    HYPERLIPIDEMIA: The lipid abnormality consists of elevated LDL; has been  controlled with Crestor 20 mg LDL as below      Lab Results  Component Value Date   CHOL 140 11/29/2016   HDL 55 11/29/2016   LDLCALC 68 11/29/2016   LDLDIRECT 102.8 11/28/2013   TRIG 84 11/29/2016   CHOLHDL 2.5 11/29/2016  '    She does see a podiatrist regularly for toenail trimming  Last foot exam Here was in 12/16: Diabetic foot exam shows normal monofilament sensation in the toes and plantar surfaces, no skin lesions or ulcers on the feet and normal pedal pulses    Examination:   BP (!) 148/86   Pulse 73   Ht 5' 3.5"  (1.613 m)   Wt 174 lb 6.4 oz (79.1 kg)   SpO2 98%   BMI 30.41 kg/m   Body mass index is 30.41 kg/m.    ASSESSMENT/ PLAN:   Diabetes type 2, uncontrolled   See history of present illness for a discussion of the blood sugar patterns, recent management and problems identified  A1c is 8.1, previously 9% As above she has postprandial hyperglycemia but is not consistent and probably depends on her meals, snacks and lack of adjustment of the dose of Humalog based on meal size  Fasting readings are controlled with current regimen of Toujeo once in the morning Also getting some benefit from metformin and Invokamet although she is taking only 1 tablet of Invokamet  Discussed the need to increase or adjust her Humalog dose based on meal size especially at lunchtime when she may need at least 8 units She will try to reduce carbohydrate snacks between meals She does not want to increase the dose for Invokamet which would help her control during the day  HYPERTENSION: Variably controlled, blood pressure relatively higher today despite taking Invokamet, previously taking a total of 100 mg and now only 50, for now she can increase her Lotensin up to 20 mg and  to follow-up with PCP.  Marland Kitchen     Hypothyroidism: TSH consistently normal now   Patient Instructions  More sugars after meals  Take upto 8 at lunch  Walk daily  Take full tab of Benazepril   Influenza vaccine given   [facility] 01/27/2017, 1:56 PM   Note: This office  note was prepared with Estate agent. Any transcriptional errors that result from this process are unintentional.

## 2017-02-01 DIAGNOSIS — H35033 Hypertensive retinopathy, bilateral: Secondary | ICD-10-CM | POA: Diagnosis not present

## 2017-02-01 DIAGNOSIS — E113211 Type 2 diabetes mellitus with mild nonproliferative diabetic retinopathy with macular edema, right eye: Secondary | ICD-10-CM | POA: Diagnosis not present

## 2017-02-01 DIAGNOSIS — H43813 Vitreous degeneration, bilateral: Secondary | ICD-10-CM | POA: Diagnosis not present

## 2017-02-01 DIAGNOSIS — E113392 Type 2 diabetes mellitus with moderate nonproliferative diabetic retinopathy without macular edema, left eye: Secondary | ICD-10-CM | POA: Diagnosis not present

## 2017-02-06 ENCOUNTER — Other Ambulatory Visit: Payer: Self-pay | Admitting: Internal Medicine

## 2017-02-06 DIAGNOSIS — I10 Essential (primary) hypertension: Secondary | ICD-10-CM | POA: Diagnosis not present

## 2017-02-06 DIAGNOSIS — D510 Vitamin B12 deficiency anemia due to intrinsic factor deficiency: Secondary | ICD-10-CM | POA: Diagnosis not present

## 2017-02-06 DIAGNOSIS — E538 Deficiency of other specified B group vitamins: Secondary | ICD-10-CM | POA: Diagnosis not present

## 2017-02-06 DIAGNOSIS — E119 Type 2 diabetes mellitus without complications: Secondary | ICD-10-CM | POA: Diagnosis not present

## 2017-02-06 DIAGNOSIS — D509 Iron deficiency anemia, unspecified: Secondary | ICD-10-CM | POA: Diagnosis not present

## 2017-02-06 DIAGNOSIS — D649 Anemia, unspecified: Secondary | ICD-10-CM | POA: Diagnosis not present

## 2017-02-06 DIAGNOSIS — R921 Mammographic calcification found on diagnostic imaging of breast: Secondary | ICD-10-CM

## 2017-02-21 ENCOUNTER — Encounter (HOSPITAL_COMMUNITY): Payer: Self-pay | Admitting: Family Medicine

## 2017-02-21 ENCOUNTER — Ambulatory Visit (HOSPITAL_COMMUNITY)
Admission: EM | Admit: 2017-02-21 | Discharge: 2017-02-21 | Disposition: A | Discharge status: Home / Self Care | Payer: Medicare Other | Attending: Family Medicine | Admitting: Family Medicine

## 2017-02-21 DIAGNOSIS — H6123 Impacted cerumen, bilateral: Secondary | ICD-10-CM | POA: Diagnosis not present

## 2017-02-21 DIAGNOSIS — R519 Headache, unspecified: Secondary | ICD-10-CM

## 2017-02-21 DIAGNOSIS — R51 Headache: Secondary | ICD-10-CM | POA: Diagnosis not present

## 2017-02-21 NOTE — ED Triage Notes (Signed)
Pt here for right temporal headache since yesterday with nausea. Denies head injury, vision issues.

## 2017-02-21 NOTE — ED Provider Notes (Signed)
[facility]    CSN: [id number] Arrival date & time: 02/21/17  1128     History   Chief Complaint Chief Complaint  Patient presents with  . Headache    HPI [name] is a 83 y.o. female.   83 year old female with history of aortic valve disorder status post aVR in 2014, arthritis, depression, uncontrolled diabetes, hyperlipidemia, hypertension, CAD with CABG x 2 in 2014, thyroid disease comes in with two-day history of right temporal headache. Patient states she first noticed pain yesterday afternoon, and went to rest due to the pain, she woke up without a headache, but then symptoms returned. She had mild nausea without vomiting. Denies head injury, blurry vision, photophobia. Denies thunderclap headache, weakness, dizziness, syncope. Denies chest pain, shortness of breath, cough, congestion, sore throat. Denies fever, chills, night sweats. Has not tried anything for the pain. Blood sugar this morning 143, which is high for her. Follows with cardiologist and has an appointment in the next month.       Past Medical History:  Diagnosis Date  . Aortic valve disorders    S/P AVR 06/2012  . Arthritis   . Asthma    AS CHILD   . Depression   . Diabetes mellitus   . Heart murmur   . Hyperlipidemia   . Hypertension   . Hypothyroidism   . Ischemic heart disease    with prior CABG x 2 in 2014 along with AVR  . Lumbago   . Shortness of breath   . Thyroid disease     Patient Active Problem List   Diagnosis Date Noted  . Overactive bladder 10/27/2015  . S/P CABG (coronary artery bypass graft) 07/16/2012  . Type II diabetes mellitus, uncontrolled ([place]) 06/17/2012  . Essential hypertension 05/20/2012  . Hyperlipidemia 05/20/2012  . Hypothyroidism 05/20/2012  . Aortic stenosis 05/10/2011    Past Surgical History:  Procedure Laterality Date  . AORTIC VALVE REPLACEMENT N/A 06/25/2012   Procedure: AORTIC VALVE REPLACEMENT (AVR);  Surgeon: Grace Isaac, MD;   Location: [facility];  Service: Open Heart Surgery;  Laterality: N/A;  . BREAST SURGERY     LT BREAST BX BENIGN  . CORONARY ARTERY BYPASS GRAFT N/A 06/25/2012   Procedure: Coronary artery bypass graft  times two using left internal mammary artery and right leg greater saphenous vein;  Surgeon: Grace Isaac, MD;  Location: [facility];  Service: Open Heart Surgery;  Laterality: N/A;  . INTRAOPERATIVE TRANSESOPHAGEAL ECHOCARDIOGRAM N/A 06/25/2012   Procedure: INTRAOPERATIVE TRANSESOPHAGEAL ECHOCARDIOGRAM;  Surgeon: Grace Isaac, MD;  Location: [facility];  Service: Open Heart Surgery;  Laterality: N/A;  . TUBAL LIGATION      OB History    No data available       Home Medications    Prior to Admission medications   Medication Sig Start Date End Date Taking? Authorizing Provider  aspirin EC 81 MG tablet Take 1 tablet (81 mg total) by mouth daily. 09/10/13   Nahser, Wonda Cheng, MD  B-D UF III MINI PEN NEEDLES 31G X 5 MM MISC USE 3 PER DAY 10/23/16   Elayne Snare, MD  benazepril (LOTENSIN) 20 MG tablet Take by mouth daily. Take 1/2 tablet by mouth daily    [provider]  calcium carbonate (TUMS) 500 MG chewable tablet Chew 1 tablet by mouth daily as needed for heartburn (indigestion).    [provider]  carvedilol (COREG) 12.5 MG tablet TAKE 1 TABLET (12.5 MG TOTAL) BY MOUTH  2 (TWO) TIMES DAILY. 04/25/16   Burtis Junes, NP  cholecalciferol (VITAMIN D) 1000 UNITS tablet Take 1,000 Units by mouth daily.    [provider]  Coenzyme Q10 (CO Q-10) 100 MG CAPS Take 1 capsule by mouth 2 (two) times daily.    [provider]  furosemide (LASIX) 20 MG tablet TAKE 1 TABLET (20 MG TOTAL) BY MOUTH DAILY AS NEEDED FOR FLUID OR EDEMA (OR WEIGHT GAIN). 10/06/16   Burtis Junes, NP  glucose blood (ONETOUCH VERIO) test strip Use to test blood sugar 3 times daily Dx code E11.65 07/20/16   Elayne Snare, MD  Insulin Glargine (TOUJEO SOLOSTAR) 300 UNIT/ML SOPN Inject 24 Units into  the skin daily. 01/04/17   Elayne Snare, MD  insulin lispro (HUMALOG) 100 UNIT/ML KiwkPen Inject 0.06-0.08 mLs (6-8 Units total) into the skin 2 (two) times daily. Patient taking differently: Inject 6-11 Units into the skin 2 (two) times daily. Takes 6 units at breakfast, takes 6-8 units at lunch and takes 3 units at dinner 01/09/17   Elayne Snare, MD  INVOKAMET 50-1000 MG TABS TAKE 1 TABLET BY MOUTH TWICE DAILY 11/14/16   Elayne Snare, MD  levothyroxine (SYNTHROID, LEVOTHROID) 75 MCG tablet TAKE 1 TABLET BY MOUTH DAILY 09/18/16   Elayne Snare, MD  metFORMIN (GLUCOPHAGE-XR) 750 MG 24 hr tablet TAKE 2 TABLETS BY MOUTH DAILY 11/03/16   Elayne Snare, MD  Multiple Vitamins-Minerals (MULTIVITAMINS THER. W/MINERALS) TABS Take 1 tablet by mouth daily.    [provider]  rosuvastatin (CRESTOR) 20 MG tablet TAKE 1 TABLET(20 MG) BY MOUTH DAILY 11/15/16   Elayne Snare, MD    Family History Family History  Problem Relation Age of Onset  . Heart disease Father     Social History Social History  Substance Use Topics  . Smoking status: Never Smoker  . Smokeless tobacco: Never Used  . Alcohol use No     Allergies   Patient has no known allergies.   Review of Systems Review of Systems  Reason unable to perform ROS: See HPI as above.     Physical Exam Triage Vital Signs ED Triage Vitals  Enc Vitals Group     BP 02/21/17 1201 (!) 161/83     Pulse Rate 02/21/17 1201 67     Resp 02/21/17 1201 18     Temp 02/21/17 1201 98.1 F (36.7 C)     Temp src --      SpO2 02/21/17 1201 98 %     Weight --      Height --      Head Circumference --      Peak Flow --      Pain Score 02/21/17 1202 5     Pain Loc --      Pain Edu? --      Excl. in [place]? --    No data found.   Updated Vital Signs BP (!) 161/83   Pulse 67   Temp 98.1 F (36.7 C)   Resp 18   SpO2 98%   Physical Exam  Constitutional: She is oriented to person, place, and time. She appears well-developed and well-nourished. No  distress.  HENT:  Head: Normocephalic and atraumatic.  Right Ear: Tympanic membrane, external ear and ear canal normal. Tympanic membrane is not erythematous and not bulging.  Left Ear: Tympanic membrane, external ear and ear canal normal. Tympanic membrane is not erythematous and not bulging.  Nose: Right sinus exhibits maxillary sinus tenderness. Right sinus  exhibits no frontal sinus tenderness. Left sinus exhibits maxillary sinus tenderness. Left sinus exhibits no frontal sinus tenderness.  Mouth/Throat: Uvula is midline, oropharynx is clear and moist and mucous membranes are normal.  Cerumen bilateral ear, visible part of TM pearly grey.   Eyes: Pupils are equal, round, and reactive to light. Conjunctivae are normal.  Neck: Normal range of motion. Neck supple.  Cardiovascular: Normal rate, regular rhythm and normal heart sounds.  Exam reveals no gallop and no friction rub.   No murmur heard. Temporal artery without bruits heard.   Pulmonary/Chest: Effort normal and breath sounds normal. She has no decreased breath sounds. She has no wheezes. She has no rhonchi. She has no rales.  Lymphadenopathy:    She has no cervical adenopathy.  Neurological: She is alert and oriented to person, place, and time. She has normal strength. She is not disoriented. No cranial nerve deficit or sensory deficit. She displays a negative Romberg sign. GCS eye subscore is 4. GCS verbal subscore is 5. GCS motor subscore is 6.  Normal rapid movement, finger to nose. Heel to shin.   Skin: Skin is warm and dry.  Psychiatric: She has a normal mood and affect. Her behavior is normal. Judgment normal.     UC Treatments / Results  Labs (all labs ordered are listed, but only abnormal results are displayed) Labs Reviewed - No data to display  EKG  EKG Interpretation None       Radiology No results found.  Procedures Procedures (including critical care time)  Medications Ordered in UC Medications - No data  to display   Initial Impression / Assessment and Plan / UC Course  I have reviewed the triage vital signs and the nursing notes.  Pertinent labs & imaging results that were available during my care of the patient were reviewed by me and considered in my medical decision making (see chart for details).  Clinical Course as of Feb 22 1244  Wed Feb 21, 2017  1238 BP recheck 137/84  [AY]    Clinical Course User Index [AY] Ok Edwards, PA-C    No alarming signs on exam. Can take tylenol for headache. Discussed possible sinus pressure causing headache, can take zyrtec as needed. Push fluids. Strict return precautions given.  Discussed case with Dr Mannie Stabile, who agrees to plan.   Final Clinical Impressions(s) / UC Diagnoses   Final diagnoses:  Acute intractable headache, unspecified headache type    New Prescriptions Discharge Medication List as of 02/21/2017 12:34 PM       Ok Edwards, PA-C 02/21/17 1245

## 2017-02-21 NOTE — Discharge Instructions (Signed)
No alarming signs on exam today. You can take tylenol for your headache as directed on the box. Keep hydrated, your urine should be clear to pale yellow in color. If experiencing worsening symptoms, dizziness, weakness, passing out, confusion/altered mental status, nausea/vomiting, blurry vision, go to the emergency department for further evaluation.

## 2017-03-01 ENCOUNTER — Other Ambulatory Visit: Payer: Self-pay | Admitting: Nurse Practitioner

## 2017-03-05 ENCOUNTER — Ambulatory Visit
Admission: RE | Admit: 2017-03-05 | Discharge: 2017-03-05 | Disposition: A | Discharge status: Home / Self Care | Payer: Medicare Other | Source: Ambulatory Visit | Attending: Internal Medicine | Admitting: Internal Medicine

## 2017-03-05 DIAGNOSIS — R922 Inconclusive mammogram: Secondary | ICD-10-CM | POA: Diagnosis not present

## 2017-03-05 DIAGNOSIS — R921 Mammographic calcification found on diagnostic imaging of breast: Secondary | ICD-10-CM

## 2017-03-18 ENCOUNTER — Other Ambulatory Visit: Payer: Self-pay | Admitting: Endocrinology

## 2017-03-26 ENCOUNTER — Encounter (INDEPENDENT_AMBULATORY_CARE_PROVIDER_SITE_OTHER): Payer: Medicare Other | Admitting: Podiatry

## 2017-03-26 NOTE — Progress Notes (Signed)
This encounter was created in error - please disregard.

## 2017-03-28 ENCOUNTER — Encounter: Payer: Self-pay | Admitting: Nurse Practitioner

## 2017-04-03 NOTE — Progress Notes (Signed)
CARDIOLOGY OFFICE NOTE  Date:  04/04/2017    [name] Date of Birth: 07/15/1932 Medical Record #[id number]  PCP:  Rogers Blocker, MD  Cardiologist:  Servando Snare Nahser    Chief Complaint  Patient presents with  . Fatigue  . Coronary Artery Disease  . Cardiac Valve Problem    Follow up visit - seen for Dr. Acie Fredrickson    History of Present Illness: [name] is a 83 y.o. female who presents today for a 4 month check. Seen for Dr. Acie Fredrickson. She now primarily follows with me.   She has a history of DM, prior AVR and CABG x 2 per EBG back in 2014 - this was with a LIMA to the LAD and SVG to the LCX, with a pericardial Edwards tissue valve #21 mm. Did have post op atrial fib - was on amiodarone. Other issues include HLD, HTN, DM, depression and hypothyroidism. Myoview ok from 06/2013. Last echo 12/2014.   I saw her back in September of 2016 for a work in visit - she was short of breath. Labs looked ok. Blood count actually improved. Echo updated - did have elevated filling pressures but otherwise unchanged. Prosthetic AV ok. Normal EF and had grade 1 diastolic dysfunction. I wanted to put her on low dose Lasix but she did not wish to proceed due to having frequent urination when on Invokana.   Seen in November of 2016 by me and she was doing ok- not really clear as to why she had improved but she had. She had talked with Dr. Dwyane Dee and he also encouraged prn diuretic. She was agreeable to trying. Seen several times back since- BP still low and Plendil had been stopped altogether. She has tried to work on losing weight. Does have some chronic fatigue issues.   Last seen back in August and she was doing ok but admitted she was depressed.   Back here today. Here with her sister - they live together. She notes she remains pretty depressed. Not very active. Continues with fatigue. This does not seem to be any worse and she admits it is probably due to depression - mostly stressed over  "life stuff".  May be getting back on B12 shots. No chest pain. Breathing is ok as long as she does not over exert. Not dizzy or lightheaded. No syncope. Tolerating her medicines. Seeing PCP at the first of the year.   Past Medical History:  Diagnosis Date  . Aortic valve disorders    S/P AVR 06/2012  . Arthritis   . Asthma    AS CHILD   . Depression   . Diabetes mellitus   . Heart murmur   . Hyperlipidemia   . Hypertension   . Hypothyroidism   . Ischemic heart disease    with prior CABG x 2 in 2014 along with AVR  . Lumbago   . Shortness of breath   . Thyroid disease     Past Surgical History:  Procedure Laterality Date  . AORTIC VALVE REPLACEMENT N/A 06/25/2012   Procedure: AORTIC VALVE REPLACEMENT (AVR);  Surgeon: Grace Isaac, MD;  Location: [place];  Service: Open Heart Surgery;  Laterality: N/A;  . BREAST SURGERY     LT BREAST BX BENIGN  . CORONARY ARTERY BYPASS GRAFT N/A 06/25/2012   Procedure: Coronary artery bypass graft  times two using left internal mammary artery and right leg greater saphenous vein;  Surgeon: Grace Isaac, MD;  Location: [facility];  Service: Open Heart Surgery;  Laterality: N/A;  . INTRAOPERATIVE TRANSESOPHAGEAL ECHOCARDIOGRAM N/A 06/25/2012   Procedure: INTRAOPERATIVE TRANSESOPHAGEAL ECHOCARDIOGRAM;  Surgeon: Grace Isaac, MD;  Location: [place];  Service: Open Heart Surgery;  Laterality: N/A;  . TUBAL LIGATION       Medications: Current Meds  Medication Sig  . aspirin EC 81 MG tablet Take 1 tablet (81 mg total) by mouth daily.  . B-D UF III MINI PEN NEEDLES 31G X 5 MM MISC USE 3 PER DAY  . benazepril (LOTENSIN) 20 MG tablet Take by mouth daily. Take 1/2 tablet by mouth daily  . calcium carbonate (TUMS) 500 MG chewable tablet Chew 1 tablet by mouth daily as needed for heartburn (indigestion).  . carvedilol (COREG) 12.5 MG tablet TAKE 1 TABLET BY MOUTH TWICE A DAY  . cholecalciferol (VITAMIN D) 1000 UNITS tablet Take 1,000 Units by mouth  daily.  . Coenzyme Q10 (CO Q-10) 100 MG CAPS Take 1 capsule by mouth 2 (two) times daily.  . furosemide (LASIX) 20 MG tablet TAKE 1 TABLET (20 MG TOTAL) BY MOUTH DAILY AS NEEDED FOR FLUID OR EDEMA (OR WEIGHT GAIN).  Marland Kitchen glucose blood (ONETOUCH VERIO) test strip Use to test blood sugar 3 times daily Dx code E11.65  . Insulin Glargine (TOUJEO SOLOSTAR) 300 UNIT/ML SOPN Inject 24 Units into the skin daily.  . insulin lispro (HUMALOG) 100 UNIT/ML KiwkPen Inject 0.06-0.08 mLs (6-8 Units total) into the skin 2 (two) times daily. (Patient taking differently: Inject 6-11 Units into the skin 2 (two) times daily. Takes 6 units at breakfast, takes 6-8 units at lunch and takes 3 units at dinner)  . INVOKAMET 50-1000 MG TABS TAKE 1 TABLET BY MOUTH TWICE DAILY  . levothyroxine (SYNTHROID, LEVOTHROID) 75 MCG tablet TAKE 1 TABLET BY MOUTH EVERY DAY  . metFORMIN (GLUCOPHAGE-XR) 750 MG 24 hr tablet TAKE 2 TABLETS BY MOUTH DAILY  . Multiple Vitamins-Minerals (MULTIVITAMINS THER. W/MINERALS) TABS Take 1 tablet by mouth daily.  . rosuvastatin (CRESTOR) 20 MG tablet TAKE 1 TABLET(20 MG) BY MOUTH DAILY     Allergies: No Known Allergies  Social History: The patient  reports that  has never smoked. she has never used smokeless tobacco. She reports that she does not drink alcohol or use drugs.   Family History: The patient's family history includes Heart disease in her father.   Review of Systems: Please see the history of present illness.   Otherwise, the review of systems is positive for none.   All other systems are reviewed and negative.   Physical Exam: VS:  BP 140/90   Pulse 62   Ht 5' 3.5" (1.613 m)   Wt 175 lb (79.4 kg)   BMI 30.51 kg/m  .  BMI Body mass index is 30.51 kg/m.  Wt Readings from Last 3 Encounters:  04/04/17 175 lb (79.4 kg)  01/26/17 174 lb 6.4 oz (79.1 kg)  11/29/16 172 lb 3.2 oz (78.1 kg)   BP is 130/70 by me.   General: Pleasant. Well developed, well nourished and in no acute  distress.   HEENT: Normal.  Neck: Supple, no JVD, carotid bruits, or masses noted.  Cardiac: Regular rate and rhythm. Soft outflow murmur. No edema.  Respiratory:  Lungs are clear to auscultation bilaterally with normal work of breathing.  GI: Soft and nontender.  MS: No deformity or atrophy. Gait and ROM intact.  Skin: Warm and dry. Color is normal.  Neuro:  Strength and sensation are  intact and no gross focal deficits noted.  Psych: Alert, appropriate and with normal affect.   LABORATORY DATA:  EKG:  EKG is ordered today. This demonstrates NSR with PAC.  Lab Results  Component Value Date   WBC 4.7 11/29/2016   HGB 10.5 (L) 11/29/2016   HCT 31.3 (L) 11/29/2016   PLT 244 11/29/2016   GLUCOSE 113 (H) 11/29/2016   CHOL 140 11/29/2016   TRIG 84 11/29/2016   HDL 55 11/29/2016   LDLDIRECT 102.8 11/28/2013   LDLCALC 68 11/29/2016   ALT 11 11/29/2016   AST 21 11/29/2016   NA 140 11/29/2016   K 4.5 11/29/2016   CL 95 (L) 11/29/2016   CREATININE 0.98 11/29/2016   BUN 16 11/29/2016   CO2 25 11/29/2016   TSH 0.92 01/22/2017   INR 1.57 (H) 06/25/2012   HGBA1C 8.1 (H) 01/22/2017   MICROALBUR 48.8 (H) 04/04/2016     BNP (last 3 results) No results for input(s): BNP in the last 8760 hours.  ProBNP (last 3 results) No results for input(s): PROBNP in the last 8760 hours.   Other Studies Reviewed Today:  Echo Study Conclusions 11/2016  - Left ventricle: The cavity size was normal. Systolic function was   normal. The estimated ejection fraction was in the range of 55%   to 60%. Wall motion was normal; there were no regional wall   motion abnormalities. There was an increased relative   contribution of atrial contraction to ventricular filling.   Doppler parameters are consistent with abnormal left ventricular   relaxation (grade 1 diastolic dysfunction). - Aortic valve: A pericardial bioprosthesis was present and   functioning normally. Mean gradient (S): 9 mm Hg. Peak  gradient   (S): 17 mm Hg. - Mitral valve: Calcified annulus. Mildly thickened leaflets .   There was mild to moderate regurgitation. - Left atrium: The atrium was mildly dilated. - Atrial septum: There was increased thickness of the septum,   consistent with lipomatous hypertrophy. - Pulmonary arteries: PA peak pressure: 42 mm Hg (S).  Impressions:  - The right ventricular systolic pressure was increased consistent   with moderate pulmonary hypertension.   Myoview Impression from 06/2013 Exercise Capacity: Lexiscan with no exercise. BP Response: Normal blood pressure response. Clinical Symptoms: No significant symptoms noted. ECG Impression: There are scattered PVCs. Comparison with Prior Nuclear Study: No previous nuclear study performed  Overall Impression: Low risk stress nuclear study small fixed distal anterior artifact which could be breast attenuation or other artifact.  LV Ejection Fraction: 66%. LV Wall Motion: Normal Wall Motion  [name], MD, [facility]    Assessment/Plan:  1. CAD - s/p CABG 06-27-12 -last Myoview stable from 2015. No active chest pain noted.Would continue with CV risk factor modification. She still struggles with with fatigue which I feel is more depression related. She is going to speak to PCP about possible medicine for depression - if this fails to improve - then may need to consider reevaluating her cardiac status.    2. Aortic stenosis with prior tissue valve AVR - recent echo from August noted - moderate pulmonary HTN - she does use prn Lasix. Valve was ok.   3. Post op atrial fibrillation - remains in NSR by exam and by EKG today.    4. HTN - recheck of her BP is ok by me - I have not made any changes today.   5. Obesity- needs to get back to exercise. I think this is more limited  by depression.   6. Fatigue - again - I think this is most likely multifactorial - probably more related to depression - continues to  be the crux of her issues - she will speak to her PCP.   7. Diabetes - followed by PCP  Current medicines are reviewed with the patient today.  The patient does not have concerns regarding medicines other than what has been noted above.  The following changes have been made:  See above.  Labs/ tests ordered today include:    Orders Placed This Encounter  Procedures  . Basic metabolic panel  . CBC  . EKG 12-Lead     Disposition:  She wants to come back in about 3 months. Will see her back then. Lab today. No medicine changes.    Patient is agreeable to this plan and will call if any problems develop in the interim.   SignedTruitt Merle, NP  04/04/2017 8:46 AM  [facility] [address] [place], [place]  [address] Phone: [phone] Fax: [phone]

## 2017-04-04 ENCOUNTER — Encounter: Payer: Self-pay | Admitting: Nurse Practitioner

## 2017-04-04 ENCOUNTER — Ambulatory Visit (INDEPENDENT_AMBULATORY_CARE_PROVIDER_SITE_OTHER): Payer: Medicare Other | Admitting: Nurse Practitioner

## 2017-04-04 VITALS — BP 140/90 | HR 62 | Ht 63.5 in | Wt 175.0 lb

## 2017-04-04 DIAGNOSIS — Z951 Presence of aortocoronary bypass graft: Secondary | ICD-10-CM | POA: Diagnosis not present

## 2017-04-04 DIAGNOSIS — R5383 Other fatigue: Secondary | ICD-10-CM

## 2017-04-04 DIAGNOSIS — Z952 Presence of prosthetic heart valve: Secondary | ICD-10-CM

## 2017-04-04 DIAGNOSIS — I10 Essential (primary) hypertension: Secondary | ICD-10-CM

## 2017-04-04 DIAGNOSIS — I259 Chronic ischemic heart disease, unspecified: Secondary | ICD-10-CM

## 2017-04-04 NOTE — Patient Instructions (Addendum)
We will be checking the following labs today - BMET and CBC   Medication Instructions:    Continue with your current medicines.     Testing/Procedures To Be Arranged:  N/A  Follow-Up:   See me in about 3 months Truitt Merle, NP 07/04/17 @ 10:30     Other Special Instructions:   Think about what we talked about today  Talk to Dr. Marlou Sa about your depression    If you need a refill on your cardiac medications before your next appointment, please call your pharmacy.   Call the [place] office at [phone] if you have any questions, problems or concerns.

## 2017-04-05 ENCOUNTER — Other Ambulatory Visit: Payer: Self-pay | Admitting: Nurse Practitioner

## 2017-04-05 LAB — CBC
Hematocrit: 33 % — ABNORMAL LOW (ref 34.0–46.6)
Hemoglobin: 10.4 g/dL — ABNORMAL LOW (ref 11.1–15.9)
MCH: 29.6 pg (ref 26.6–33.0)
MCHC: 31.5 g/dL (ref 31.5–35.7)
MCV: 94 fL (ref 79–97)
Platelets: 221 10*3/uL (ref 150–379)
RBC: 3.51 x10E6/uL — ABNORMAL LOW (ref 3.77–5.28)
RDW: 14.5 % (ref 12.3–15.4)
WBC: 6.1 10*3/uL (ref 3.4–10.8)

## 2017-04-05 LAB — BASIC METABOLIC PANEL
BUN/Creatinine Ratio: 20 (ref 12–28)
BUN: 20 mg/dL (ref 8–27)
CO2: 27 mmol/L (ref 20–29)
Calcium: 10 mg/dL (ref 8.7–10.3)
Chloride: 100 mmol/L (ref 96–106)
Creatinine, Ser: 0.99 mg/dL (ref 0.57–1.00)
GFR calc Af Amer: 61 mL/min/{1.73_m2} (ref >59)
GFR calc non Af Amer: 53 mL/min/{1.73_m2} — ABNORMAL LOW (ref >59)
Glucose: 99 mg/dL (ref 65–99)
Potassium: 3.9 mmol/L (ref 3.5–5.2)
Sodium: 141 mmol/L (ref 134–144)

## 2017-04-16 ENCOUNTER — Other Ambulatory Visit: Payer: Self-pay | Admitting: Endocrinology

## 2017-04-16 DIAGNOSIS — E113392 Type 2 diabetes mellitus with moderate nonproliferative diabetic retinopathy without macular edema, left eye: Secondary | ICD-10-CM | POA: Diagnosis not present

## 2017-04-16 DIAGNOSIS — H43813 Vitreous degeneration, bilateral: Secondary | ICD-10-CM | POA: Diagnosis not present

## 2017-04-16 DIAGNOSIS — H35033 Hypertensive retinopathy, bilateral: Secondary | ICD-10-CM | POA: Diagnosis not present

## 2017-04-16 DIAGNOSIS — E113211 Type 2 diabetes mellitus with mild nonproliferative diabetic retinopathy with macular edema, right eye: Secondary | ICD-10-CM | POA: Diagnosis not present

## 2017-04-20 ENCOUNTER — Other Ambulatory Visit: Payer: Medicare Other

## 2017-04-27 ENCOUNTER — Ambulatory Visit: Payer: Medicare Other | Admitting: Endocrinology

## 2017-05-07 ENCOUNTER — Encounter: Payer: Self-pay | Admitting: Sports Medicine

## 2017-05-07 ENCOUNTER — Ambulatory Visit (INDEPENDENT_AMBULATORY_CARE_PROVIDER_SITE_OTHER): Payer: Medicare Other | Admitting: Sports Medicine

## 2017-05-07 DIAGNOSIS — L84 Corns and callosities: Secondary | ICD-10-CM

## 2017-05-07 DIAGNOSIS — M79676 Pain in unspecified toe(s): Secondary | ICD-10-CM

## 2017-05-07 DIAGNOSIS — B351 Tinea unguium: Secondary | ICD-10-CM

## 2017-05-07 DIAGNOSIS — E1151 Type 2 diabetes mellitus with diabetic peripheral angiopathy without gangrene: Secondary | ICD-10-CM

## 2017-05-07 LAB — HM DIABETES FOOT EXAM

## 2017-05-07 NOTE — Progress Notes (Signed)
Subjective: [name] is a 84 y.o. female patient with history of diabetes who presents to office today complaining of long, painful nails and callus while ambulating in shoes; unable to trim. Patient states that the glucose reading this morning was 162 mg/dl. Patient denies any new changes in medication or new problems. Saw PCP on Friday. Patient denies any new cramping, numbness, burning or tingling in the legs.  Patient Active Problem List   Diagnosis Date Noted  . Overactive bladder 10/27/2015  . S/P CABG (coronary artery bypass graft) 07/16/2012  . Type II diabetes mellitus, uncontrolled ([place]) 06/17/2012  . Essential hypertension 05/20/2012  . Hyperlipidemia 05/20/2012  . Hypothyroidism 05/20/2012  . Aortic stenosis 05/10/2011   Current Outpatient Medications on File Prior to Visit  Medication Sig Dispense Refill  . aspirin EC 81 MG tablet Take 1 tablet (81 mg total) by mouth daily.    . B-D UF III MINI PEN NEEDLES 31G X 5 MM MISC USE 3 PER DAY 100 each 2  . benazepril (LOTENSIN) 20 MG tablet Take by mouth daily. Take 1/2 tablet by mouth daily    . calcium carbonate (TUMS) 500 MG chewable tablet Chew 1 tablet by mouth daily as needed for heartburn (indigestion).    . carvedilol (COREG) 12.5 MG tablet TAKE 1 TABLET BY MOUTH TWICE A DAY 60 tablet 0  . carvedilol (COREG) 12.5 MG tablet TAKE 1 TABLET BY MOUTH TWICE A DAY 60 tablet 10  . cholecalciferol (VITAMIN D) 1000 UNITS tablet Take 1,000 Units by mouth daily.    . Coenzyme Q10 (CO Q-10) 100 MG CAPS Take 1 capsule by mouth 2 (two) times daily.    . furosemide (LASIX) 20 MG tablet TAKE 1 TABLET (20 MG TOTAL) BY MOUTH DAILY AS NEEDED FOR FLUID OR EDEMA (OR WEIGHT GAIN). 30 tablet 1  . glucose blood (ONETOUCH VERIO) test strip Use to test blood sugar 3 times daily Dx code E11.65 150 each 5  . Insulin Glargine (TOUJEO SOLOSTAR) 300 UNIT/ML SOPN Inject 24 Units into the skin daily. 4.5 mL 5  . insulin lispro (HUMALOG) 100 UNIT/ML  KiwkPen Inject 0.06-0.08 mLs (6-8 Units total) into the skin 2 (two) times daily. (Patient taking differently: Inject 6-11 Units into the skin 2 (two) times daily. Takes 6 units at breakfast, takes 6-8 units at lunch and takes 3 units at dinner) 15 mL 3  . INVOKAMET 50-1000 MG TABS TAKE 1 TABLET BY MOUTH TWICE DAILY 180 tablet 0  . levothyroxine (SYNTHROID, LEVOTHROID) 75 MCG tablet TAKE 1 TABLET BY MOUTH EVERY DAY 30 tablet 5  . metFORMIN (GLUCOPHAGE-XR) 750 MG 24 hr tablet TAKE 2 TABLETS BY MOUTH DAILY 180 tablet 0  . Multiple Vitamins-Minerals (MULTIVITAMINS THER. W/MINERALS) TABS Take 1 tablet by mouth daily.    . rosuvastatin (CRESTOR) 20 MG tablet TAKE 1 TABLET(20 MG) BY MOUTH DAILY 90 tablet 2   No current facility-administered medications on file prior to visit.    No Known Allergies  Recent Results (from the past 2160 hour(s))  Basic metabolic panel     Status: Abnormal   Collection Time: 04/04/17  8:51 AM  Result Value Ref Range   Glucose 99 65 - 99 mg/dL   BUN 20 8 - 27 mg/dL   Creatinine, Ser 0.99 0.57 - 1.00 mg/dL   GFR calc non Af Amer 53 (L) >59 mL/min/1.73   GFR calc Af Amer 61 >59 mL/min/1.73   BUN/Creatinine Ratio 20 12 - 28   Sodium 141  134 - 144 mmol/L   Potassium 3.9 3.5 - 5.2 mmol/L   Chloride 100 96 - 106 mmol/L   CO2 27 20 - 29 mmol/L   Calcium 10.0 8.7 - 10.3 mg/dL  CBC     Status: Abnormal   Collection Time: 04/04/17  8:51 AM  Result Value Ref Range   WBC 6.1 3.4 - 10.8 x10E3/uL   RBC 3.51 (L) 3.77 - 5.28 x10E6/uL   Hemoglobin 10.4 (L) 11.1 - 15.9 g/dL   Hematocrit 33.0 (L) 34.0 - 46.6 %   MCV 94 79 - 97 fL   MCH 29.6 26.6 - 33.0 pg   MCHC 31.5 31.5 - 35.7 g/dL   RDW 14.5 12.3 - 15.4 %   Platelets 221 150 - 379 x10E3/uL    Objective: General: Patient is awake, alert, and oriented x 3 and in no acute distress.  Integument: Skin is warm, dry and supple bilateral. Nails are tender, long, thickened and  dystrophic with subungual debris, consistent  with onychomycosis, 1-5 bilateral. + callus right sub met 2, dorsal 2nd IPJ on right and 5th toe on left, No signs of infection. Remaining integument unremarkable.  Vasculature:  Dorsalis Pedis pulse 1/4 bilateral. Posterior Tibial pulse  0/4 bilateral due to edema.  Capillary fill time <3 sec 1-5 bilateral. No hair growth to the level of the digits. Temperature gradient within normal limits. No varicosities present bilateral. Trace edema present bilateral.   Neurology: The patient has diminished sensation measured with a 5.07/10g Semmes Weinstein Monofilament at all pedal sites bilateral . Vibratory sensation mildly diminished bilateral with tuning fork. No Babinski sign present bilateral.   Musculoskeletal: Bunion and hammertoe pedal deformities noted bilateral. Muscular strength 4/5 in all lower extremity muscular groups bilateral without pain on range of motion. No tenderness with calf compression bilateral.  Assessment and Plan: Problem List Items Addressed This Visit    None    Visit Diagnoses    Pain due to onychomycosis of toenail    -  Primary   Corns and callosities       Diabetic peripheral vascular disorder ([place])         -Examined patient. -Discussed and educated patient on diabetic foot care, especially with  regards to the vascular, neurological and musculoskeletal systems.  -Stressed the importance of good glycemic control and the detriment of not  controlling glucose levels in relation to the foot. -Mechanically debrided callus x 4 using sterile chisel blade and all nails 1-5 bilateral using sterile nail nipper and filed with dremel without incident  -Answered all patient questions -Patient to return  in 3 months for at risk foot care -Patient advised to call the office if any problems or questions arise in the meantime.  Landis Martins, DPM

## 2017-05-24 ENCOUNTER — Other Ambulatory Visit (INDEPENDENT_AMBULATORY_CARE_PROVIDER_SITE_OTHER): Payer: Medicare Other

## 2017-05-24 DIAGNOSIS — E1165 Type 2 diabetes mellitus with hyperglycemia: Secondary | ICD-10-CM

## 2017-05-24 DIAGNOSIS — Z794 Long term (current) use of insulin: Secondary | ICD-10-CM

## 2017-05-24 LAB — COMPREHENSIVE METABOLIC PANEL
ALK PHOS: 39 U/L (ref 39–117)
ALT: 12 U/L (ref 0–35)
AST: 20 U/L (ref 0–37)
Albumin: 4.1 g/dL (ref 3.5–5.2)
BUN: 20 mg/dL (ref 6–23)
CHLORIDE: 101 meq/L (ref 96–112)
CO2: 29 meq/L (ref 19–32)
Calcium: 9.7 mg/dL (ref 8.4–10.5)
Creatinine, Ser: 0.93 mg/dL (ref 0.40–1.20)
GFR: 74.2 mL/min (ref >60.00)
GLUCOSE: 161 mg/dL — AB (ref 70–99)
POTASSIUM: 3.6 meq/L (ref 3.5–5.1)
SODIUM: 137 meq/L (ref 135–145)
TOTAL PROTEIN: 7.8 g/dL (ref 6.0–8.3)
Total Bilirubin: 0.5 mg/dL (ref 0.2–1.2)

## 2017-05-24 LAB — MICROALBUMIN / CREATININE URINE RATIO
CREATININE, U: 103.5 mg/dL
MICROALB UR: 37.8 mg/dL — AB (ref 0.0–1.9)
MICROALB/CREAT RATIO: 36.5 mg/g — AB (ref 0.0–30.0)

## 2017-05-24 LAB — HEMOGLOBIN A1C: Hgb A1c MFr Bld: 8.3 % — ABNORMAL HIGH (ref 4.6–6.5)

## 2017-05-28 ENCOUNTER — Ambulatory Visit (HOSPITAL_COMMUNITY): Payer: Medicare Other

## 2017-05-28 ENCOUNTER — Encounter (HOSPITAL_COMMUNITY): Payer: Self-pay | Admitting: Emergency Medicine

## 2017-05-28 ENCOUNTER — Ambulatory Visit (INDEPENDENT_AMBULATORY_CARE_PROVIDER_SITE_OTHER): Payer: Medicare Other

## 2017-05-28 ENCOUNTER — Ambulatory Visit (HOSPITAL_COMMUNITY)
Admission: EM | Admit: 2017-05-28 | Discharge: 2017-05-28 | Disposition: A | Discharge status: Home / Self Care | Payer: Medicare Other | Source: Home / Self Care

## 2017-05-28 ENCOUNTER — Encounter (HOSPITAL_COMMUNITY): Payer: Self-pay

## 2017-05-28 ENCOUNTER — Other Ambulatory Visit: Payer: Self-pay

## 2017-05-28 DIAGNOSIS — W010XXA Fall on same level from slipping, tripping and stumbling without subsequent striking against object, initial encounter: Secondary | ICD-10-CM | POA: Diagnosis not present

## 2017-05-28 DIAGNOSIS — S199XXA Unspecified injury of neck, initial encounter: Secondary | ICD-10-CM | POA: Diagnosis not present

## 2017-05-28 DIAGNOSIS — S42301A Unspecified fracture of shaft of humerus, right arm, initial encounter for closed fracture: Secondary | ICD-10-CM | POA: Diagnosis not present

## 2017-05-28 DIAGNOSIS — S52121A Displaced fracture of head of right radius, initial encounter for closed fracture: Secondary | ICD-10-CM

## 2017-05-28 DIAGNOSIS — S52571A Other intraarticular fracture of lower end of right radius, initial encounter for closed fracture: Secondary | ICD-10-CM

## 2017-05-28 DIAGNOSIS — E039 Hypothyroidism, unspecified: Secondary | ICD-10-CM | POA: Insufficient documentation

## 2017-05-28 DIAGNOSIS — S4291XA Fracture of right shoulder girdle, part unspecified, initial encounter for closed fracture: Secondary | ICD-10-CM

## 2017-05-28 DIAGNOSIS — I2510 Atherosclerotic heart disease of native coronary artery without angina pectoris: Secondary | ICD-10-CM | POA: Diagnosis not present

## 2017-05-28 DIAGNOSIS — Y999 Unspecified external cause status: Secondary | ICD-10-CM | POA: Diagnosis not present

## 2017-05-28 DIAGNOSIS — Z794 Long term (current) use of insulin: Secondary | ICD-10-CM | POA: Diagnosis not present

## 2017-05-28 DIAGNOSIS — J45909 Unspecified asthma, uncomplicated: Secondary | ICD-10-CM | POA: Insufficient documentation

## 2017-05-28 DIAGNOSIS — Y9389 Activity, other specified: Secondary | ICD-10-CM | POA: Diagnosis not present

## 2017-05-28 DIAGNOSIS — Z79899 Other long term (current) drug therapy: Secondary | ICD-10-CM | POA: Insufficient documentation

## 2017-05-28 DIAGNOSIS — E119 Type 2 diabetes mellitus without complications: Secondary | ICD-10-CM | POA: Insufficient documentation

## 2017-05-28 DIAGNOSIS — S0990XA Unspecified injury of head, initial encounter: Secondary | ICD-10-CM | POA: Diagnosis not present

## 2017-05-28 DIAGNOSIS — S42201A Unspecified fracture of upper end of right humerus, initial encounter for closed fracture: Secondary | ICD-10-CM | POA: Diagnosis not present

## 2017-05-28 DIAGNOSIS — Z7982 Long term (current) use of aspirin: Secondary | ICD-10-CM | POA: Diagnosis not present

## 2017-05-28 DIAGNOSIS — S52501A Unspecified fracture of the lower end of right radius, initial encounter for closed fracture: Secondary | ICD-10-CM | POA: Diagnosis not present

## 2017-05-28 DIAGNOSIS — Y92009 Unspecified place in unspecified non-institutional (private) residence as the place of occurrence of the external cause: Secondary | ICD-10-CM | POA: Insufficient documentation

## 2017-05-28 DIAGNOSIS — S42401A Unspecified fracture of lower end of right humerus, initial encounter for closed fracture: Secondary | ICD-10-CM | POA: Diagnosis not present

## 2017-05-28 DIAGNOSIS — W19XXXA Unspecified fall, initial encounter: Secondary | ICD-10-CM

## 2017-05-28 DIAGNOSIS — S0993XA Unspecified injury of face, initial encounter: Secondary | ICD-10-CM | POA: Diagnosis not present

## 2017-05-28 NOTE — ED Triage Notes (Signed)
Pt states that she tripped over her own feet today getting groceries and fell. Pain to R shoulder and R wrist. Pt hit head, no LOC, on ASA daily.

## 2017-05-28 NOTE — ED Triage Notes (Addendum)
Pt states she was walking from her car to her home when she thinks she tripped and fell.  She has pain in her right shoulder, arm and hand, an abrasion and pain to her right knee and a small abrasion to the heel of her left hand.  Pt thinks she may have dropped her Carvedilol this morning as she presents with elevated BP.  Pt states she will take her evening dose when she gets home.

## 2017-05-28 NOTE — Discharge Instructions (Signed)
You are going to need to go to the emergency department to be seen by orthopedics because the fracture is not something we can manage here.

## 2017-05-28 NOTE — ED Provider Notes (Signed)
[facility]   [id number] 05/28/17 Arrival Time: 7124   SUBJECTIVE:  [name] is a 84 y.o. female who presents to the urgent care with complaint of pain in her right shoulder, arm and hand, an abrasion and pain to her right knee and a small abrasion to the heel of her left hand.  Pt thinks she may have dropped her Carvedilol this morning as she presents with elevated BP.  Pt states she will take her evening dose when she gets home.  Past Medical History:  Diagnosis Date  . Aortic valve disorders    S/P AVR 06/2012  . Arthritis   . Asthma    AS CHILD   . Depression   . Diabetes mellitus   . Heart murmur   . Hyperlipidemia   . Hypertension   . Hypothyroidism   . Ischemic heart disease    with prior CABG x 2 in 2014 along with AVR  . Lumbago   . Shortness of breath   . Thyroid disease    Family History  Problem Relation Age of Onset  . Heart disease Father    Social History   Socioeconomic History  . Marital status: Widowed    Spouse name: Not on file  . Number of children: Not on file  . Years of education: Not on file  . Highest education level: Not on file  Social Needs  . Financial resource strain: Not on file  . Food insecurity - worry: Not on file  . Food insecurity - inability: Not on file  . Transportation needs - medical: Not on file  . Transportation needs - non-medical: Not on file  Occupational History  . Not on file  Tobacco Use  . Smoking status: Never Smoker  . Smokeless tobacco: Never Used  Substance and Sexual Activity  . Alcohol use: No  . Drug use: No  . Sexual activity: Not Currently  Other Topics Concern  . Not on file  Social History Narrative  . Not on file   Current Meds  Medication Sig  . aspirin EC 81 MG tablet Take 1 tablet (81 mg total) by mouth daily.  . B-D UF III MINI PEN NEEDLES 31G X 5 MM MISC USE 3 PER DAY  . benazepril (LOTENSIN) 20 MG tablet Take by mouth daily. Take 1/2 tablet by mouth daily  .  calcium carbonate (TUMS) 500 MG chewable tablet Chew 1 tablet by mouth daily as needed for heartburn (indigestion).  . carvedilol (COREG) 12.5 MG tablet TAKE 1 TABLET BY MOUTH TWICE A DAY  . carvedilol (COREG) 12.5 MG tablet TAKE 1 TABLET BY MOUTH TWICE A DAY  . cholecalciferol (VITAMIN D) 1000 UNITS tablet Take 1,000 Units by mouth daily.  . Coenzyme Q10 (CO Q-10) 100 MG CAPS Take 1 capsule by mouth 2 (two) times daily.  . furosemide (LASIX) 20 MG tablet TAKE 1 TABLET (20 MG TOTAL) BY MOUTH DAILY AS NEEDED FOR FLUID OR EDEMA (OR WEIGHT GAIN).  Marland Kitchen glucose blood (ONETOUCH VERIO) test strip Use to test blood sugar 3 times daily Dx code E11.65  . Insulin Glargine (TOUJEO SOLOSTAR) 300 UNIT/ML SOPN Inject 24 Units into the skin daily.  . insulin lispro (HUMALOG) 100 UNIT/ML KiwkPen Inject 0.06-0.08 mLs (6-8 Units total) into the skin 2 (two) times daily. (Patient taking differently: Inject 6-11 Units into the skin 2 (two) times daily. Takes 6 units at breakfast, takes 6-8 units at lunch and takes 3 units at dinner)  .  INVOKAMET 50-1000 MG TABS TAKE 1 TABLET BY MOUTH TWICE DAILY  . levothyroxine (SYNTHROID, LEVOTHROID) 75 MCG tablet TAKE 1 TABLET BY MOUTH EVERY DAY  . metFORMIN (GLUCOPHAGE-XR) 750 MG 24 hr tablet TAKE 2 TABLETS BY MOUTH DAILY  . Multiple Vitamins-Minerals (MULTIVITAMINS THER. W/MINERALS) TABS Take 1 tablet by mouth daily.  . rosuvastatin (CRESTOR) 20 MG tablet TAKE 1 TABLET(20 MG) BY MOUTH DAILY   No Known Allergies    ROS: As per HPI, remainder of ROS negative.   OBJECTIVE:   Vitals:   05/28/17 1821  BP: (!) 196/82  Pulse: 73  Temp: 98.6 F (37 C)  TempSrc: Oral  SpO2: 100%     General appearance: alert; no distress Eyes: PERRL; EOMI; conjunctiva normal HENT: normocephalic; atraumatic;; oral mucosa normal  Neck: supple Back: no CVA tenderness Extremities: no cyanosis or edema;  Tender lateral right clavicular area nontender right elbow Tender radial  wrist. Skin: warm and dry Neurologic: normal gait; grossly normal Psychological: alert and cooperative; normal mood and affect   Right shoulder:  Fractured head of humerus with dislocation Right wrist:  Fractured distal radius   Labs:  Results for orders placed or performed in visit on 05/24/17  Microalbumin / creatinine urine ratio  Result Value Ref Range   Microalb, Ur 37.8 (H) 0.0 - 1.9 mg/dL   Creatinine,U 103.5 mg/dL   Microalb Creat Ratio 36.5 (H) 0.0 - 30.0 mg/g  Comprehensive metabolic panel  Result Value Ref Range   Sodium 137 135 - 145 mEq/L   Potassium 3.6 3.5 - 5.1 mEq/L   Chloride 101 96 - 112 mEq/L   CO2 29 19 - 32 mEq/L   Glucose, Bld 161 (H) 70 - 99 mg/dL   BUN 20 6 - 23 mg/dL   Creatinine, Ser 0.93 0.40 - 1.20 mg/dL   Total Bilirubin 0.5 0.2 - 1.2 mg/dL   Alkaline Phosphatase 39 39 - 117 U/L   AST 20 0 - 37 U/L   ALT 12 0 - 35 U/L   Total Protein 7.8 6.0 - 8.3 g/dL   Albumin 4.1 3.5 - 5.2 g/dL   Calcium 9.7 8.4 - 10.5 mg/dL   GFR 74.20 >60.00 mL/min  Hemoglobin A1c  Result Value Ref Range   Hgb A1c MFr Bld 8.3 (H) 4.6 - 6.5 %    Labs Reviewed - No data to display  No results found.     ASSESSMENT & PLAN:  1. Closed fracture dislocation of joint of right shoulder girdle, initial encounter   2. Fall, initial encounter   3. Other closed intra-articular fracture of distal end of right radius, initial encounter     No orders of the defined types were placed in this encounter.   Reviewed expectations re: course of current medical issues. Questions answered. Outlined signs and symptoms indicating need for more acute intervention. Patient verbalized understanding. After Visit Summary given.    You are going to need to go to the emergency department to be seen by orthopedics because the fracture is not something we can manage here.    Robyn Haber, MD 05/28/17 1850

## 2017-05-29 ENCOUNTER — Emergency Department (HOSPITAL_COMMUNITY)
Admission: EM | Admit: 2017-05-29 | Discharge: 2017-05-29 | Disposition: A | Discharge status: Home / Self Care | Payer: Medicare Other | Attending: Emergency Medicine | Admitting: Emergency Medicine

## 2017-05-29 ENCOUNTER — Ambulatory Visit: Payer: Medicare Other | Admitting: Endocrinology

## 2017-05-29 ENCOUNTER — Emergency Department (HOSPITAL_COMMUNITY): Payer: Medicare Other

## 2017-05-29 DIAGNOSIS — S52501A Unspecified fracture of the lower end of right radius, initial encounter for closed fracture: Secondary | ICD-10-CM

## 2017-05-29 DIAGNOSIS — S0990XA Unspecified injury of head, initial encounter: Secondary | ICD-10-CM

## 2017-05-29 DIAGNOSIS — S199XXA Unspecified injury of neck, initial encounter: Secondary | ICD-10-CM | POA: Diagnosis not present

## 2017-05-29 DIAGNOSIS — S0993XA Unspecified injury of face, initial encounter: Secondary | ICD-10-CM | POA: Diagnosis not present

## 2017-05-29 DIAGNOSIS — W19XXXA Unspecified fall, initial encounter: Secondary | ICD-10-CM

## 2017-05-29 DIAGNOSIS — S42201A Unspecified fracture of upper end of right humerus, initial encounter for closed fracture: Secondary | ICD-10-CM

## 2017-05-29 LAB — CBG MONITORING, ED
GLUCOSE-CAPILLARY: 136 mg/dL — AB (ref 65–99)
GLUCOSE-CAPILLARY: 191 mg/dL — AB (ref 65–99)
Glucose-Capillary: 230 mg/dL — ABNORMAL HIGH (ref 65–99)

## 2017-05-29 MED ORDER — ONDANSETRON 4 MG PO TBDP: 4.0000 mg | ORAL_TABLET | Freq: Four times a day (QID) | ORAL | 0 refills | Status: DC | PRN

## 2017-05-29 MED ORDER — OXYCODONE-ACETAMINOPHEN 5-325 MG PO TABS: 1.0000 | ORAL_TABLET | ORAL | 0 refills | Status: DC | PRN

## 2017-05-29 MED ORDER — CARVEDILOL 12.5 MG PO TABS: 12.5000 mg | ORAL_TABLET | Freq: Two times a day (BID) | ORAL | 0 refills | Status: DC

## 2017-05-29 MED ORDER — DOCUSATE SODIUM 100 MG PO CAPS: 100.0000 mg | ORAL_CAPSULE | Freq: Two times a day (BID) | ORAL | 0 refills | Status: DC

## 2017-05-29 MED ORDER — ONDANSETRON 4 MG PO TBDP
4.0000 mg | ORAL_TABLET | Freq: Once | ORAL | Status: AC
  Administered 2017-05-29: 4 mg via ORAL
  Filled 2017-05-29: qty 1

## 2017-05-29 MED ORDER — HYDROCODONE-ACETAMINOPHEN 5-325 MG PO TABS
1.0000 | ORAL_TABLET | Freq: Once | ORAL | Status: AC
  Administered 2017-05-29: 1 via ORAL
  Filled 2017-05-29: qty 1

## 2017-05-29 NOTE — ED Notes (Signed)
Patient Alert and oriented X4. Stable and ambulatory. Patient verbalized understanding of the discharge instructions.  Patient belongings were taken by the patient.  

## 2017-05-29 NOTE — ED Notes (Signed)
Ortho tech paged about sugar tong for right wrist

## 2017-05-29 NOTE — ED Provider Notes (Signed)
TIME SEEN: 1:21 AM  CHIEF COMPLAINT: Right shoulder pain, right wrist pain, head injury  HPI: Patient is an 84 year old female with history of hypertension, diabetes, hyperlipidemia, aortic valve replacement not on anticoagulation, CAD, hypothyroidism who presents emergency department after mechanical fall.  States that she was helping her sister carry groceries into the house when she tripped and fell onto her right side.  She is right-hand dominant.  Complaining of right wrist and right shoulder pain.  Was seen at urgent care and was told she had a right humerus fracture, right wrist fracture and was sent to the emergency department for orthopedic evaluation.  She did strike her head at that time and has bruising and swelling to the right cheek.  Denies headache.  No neck or back pain.  No chest or abdominal pain.  Denies numbness, tingling or focal weakness.  PCP is Dr. Marlou Sa.  She does not have a local orthopedist.  ROS: See HPI Constitutional: no fever  Eyes: no drainage  ENT: no runny nose   Cardiovascular:  no chest pain  Resp: no SOB  GI: no vomiting GU: no dysuria Integumentary: no rash  Allergy: no hives  Musculoskeletal: no leg swelling  Neurological: no slurred speech ROS otherwise negative  PAST MEDICAL HISTORY/PAST SURGICAL HISTORY:  Past Medical History:  Diagnosis Date  . Aortic valve disorders    S/P AVR 06/2012  . Arthritis   . Asthma    AS CHILD   . Depression   . Diabetes mellitus   . Heart murmur   . Hyperlipidemia   . Hypertension   . Hypothyroidism   . Ischemic heart disease    with prior CABG x 2 in 2014 along with AVR  . Lumbago   . Shortness of breath   . Thyroid disease     MEDICATIONS:  Prior to Admission medications   Medication Sig Start Date End Date Taking? Authorizing Provider  aspirin EC 81 MG tablet Take 1 tablet (81 mg total) by mouth daily. 09/10/13   Nahser, Wonda Cheng, MD  B-D UF III MINI PEN NEEDLES 31G X 5 MM MISC USE 3 PER DAY 10/23/16    Elayne Snare, MD  benazepril (LOTENSIN) 20 MG tablet Take by mouth daily. Take 1/2 tablet by mouth daily    [provider]  calcium carbonate (TUMS) 500 MG chewable tablet Chew 1 tablet by mouth daily as needed for heartburn (indigestion).    [provider]  carvedilol (COREG) 12.5 MG tablet TAKE 1 TABLET BY MOUTH TWICE A DAY 03/01/17   Burtis Junes, NP  carvedilol (COREG) 12.5 MG tablet TAKE 1 TABLET BY MOUTH TWICE A DAY 04/05/17   Burtis Junes, NP  cholecalciferol (VITAMIN D) 1000 UNITS tablet Take 1,000 Units by mouth daily.    [provider]  Coenzyme Q10 (CO Q-10) 100 MG CAPS Take 1 capsule by mouth 2 (two) times daily.    [provider]  furosemide (LASIX) 20 MG tablet TAKE 1 TABLET (20 MG TOTAL) BY MOUTH DAILY AS NEEDED FOR FLUID OR EDEMA (OR WEIGHT GAIN). 10/06/16   Burtis Junes, NP  glucose blood (ONETOUCH VERIO) test strip Use to test blood sugar 3 times daily Dx code E11.65 07/20/16   Elayne Snare, MD  Insulin Glargine (TOUJEO SOLOSTAR) 300 UNIT/ML SOPN Inject 24 Units into the skin daily. 01/04/17   Elayne Snare, MD  insulin lispro (HUMALOG) 100 UNIT/ML KiwkPen Inject 0.06-0.08 mLs (6-8 Units total) into the skin 2 (two)  times daily. Patient taking differently: Inject 6-11 Units into the skin 2 (two) times daily. Takes 6 units at breakfast, takes 6-8 units at lunch and takes 3 units at dinner 01/09/17   Elayne Snare, MD  INVOKAMET 50-1000 MG TABS TAKE 1 TABLET BY MOUTH TWICE DAILY 11/14/16   Elayne Snare, MD  levothyroxine (SYNTHROID, LEVOTHROID) 75 MCG tablet TAKE 1 TABLET BY MOUTH EVERY DAY 03/18/17   Elayne Snare, MD  metFORMIN (GLUCOPHAGE-XR) 750 MG 24 hr tablet TAKE 2 TABLETS BY MOUTH DAILY 04/16/17   Elayne Snare, MD  Multiple Vitamins-Minerals (MULTIVITAMINS THER. W/MINERALS) TABS Take 1 tablet by mouth daily.    [provider]  rosuvastatin (CRESTOR) 20 MG tablet TAKE 1 TABLET(20 MG) BY MOUTH DAILY 11/15/16   Elayne Snare, MD     ALLERGIES:  No Known Allergies  SOCIAL HISTORY:  Social History   Tobacco Use  . Smoking status: Never Smoker  . Smokeless tobacco: Never Used  Substance Use Topics  . Alcohol use: No    FAMILY HISTORY: Family History  Problem Relation Age of Onset  . Heart disease Father     EXAM: BP (!) 148/78 (BP Location: Left Arm)   Pulse 72   Temp 98.5 F (36.9 C) (Oral)   Resp 18   SpO2 98%  CONSTITUTIONAL: Alert and oriented and responds appropriately to questions. Well-appearing; well-nourished; GCS 15, elderly, in no significant distress HEAD: Normocephalic; mild bruising and swelling noted to the right cheek with bony tenderness over the right maxilla EYES: Conjunctivae clear, PERRL, EOMI, no subconjunctival hemorrhage or hyphema ENT: normal nose; no rhinorrhea; moist mucous membranes; pharynx without lesions noted; no dental injury; no septal hematoma NECK: Supple, no meningismus, no LAD; no midline spinal tenderness, step-off or deformity; trachea midline CARD: RRR; S1 and S2 appreciated;+ murmur, no clicks, no rubs, no gallops RESP: Normal chest excursion without splinting or tachypnea; breath sounds clear and equal bilaterally; no wheezes, no rhonchi, no rales; no hypoxia or respiratory distress CHEST:  chest wall stable, no crepitus or ecchymosis or deformity, nontender to palpation; no flail chest ABD/GI: Normal bowel sounds; non-distended; soft, non-tender, no rebound, no guarding; no ecchymosis or other lesions noted PELVIS:  stable, nontender to palpation BACK:  The back appears normal and is non-tender to palpation, there is no CVA tenderness; no midline spinal tenderness, step-off or deformity EXT: Patient is tender to palpation over the proximal right humerus as well as the dorsal wrist diffusely.  2+ radial pulses bilaterally.  Decreased range of motion in the right wrist and shoulder secondary to pain but normal range of motion in the elbow and fingers on the right  side.  Normal capillary refill.  Otherwise normal ROM in all joints; otherwise extremities are non-tender to palpation; no edema; normal capillary refill; no cyanosis, compartments are soft, extremities are warm and well-perfused, no ecchymosis SKIN: Normal color for age and race; warm NEURO: Moves all extremities equally, normal sensation diffusely, normal gait, cranial nerves II through XII intact, normal speech PSYCH: The patient's mood and manner are appropriate. Grooming and personal hygiene are appropriate.  MEDICAL DECISION MAKING: Patient here with mechanical fall.  X-ray at urgent care showed a right humeral neck fracture with joint effusion and displacement inferiorly of the humeral head without dislocation.  Displacement likely related to hemarthrosis.  She is on aspirin but no other antiplatelet or anticoagulant.  We will place her in a sling as well as a sugar tong splint and consult hand surgery for outpatient  follow-up.  She is neurovascularly intact distally.  She did strike her head and given her age we will obtain a CT of her head, face and cervical spine.  Will give Vicodin and Zofran for symptomatic relief.  No other injury on exam.  Neurologically intact and hemodynamically stable.  ED PROGRESS: 2:20 AM  D/w Dr. Grandville Silos with [facility].  Appreciate his help.  Agrees with outpatient follow up.  His office will call patient and schedule for an appointment.  Patient states that she was told to come to the emergency department by the urgent care for definitive orthopedic evaluation and treatment.  She is also told that she had a right shoulder dislocation.  Shoulder does not appear dislocated only displaced inferiorly secondary to joint effusion.  Dr. Grandville Silos has reviewed patient's x-rays and agrees.  Dr. Grandville Silos states that patient can be seen in his office for follow-up for her wrist as well as her shoulder.   2:45 AM  Pt's CT of her head, neck and face show no acute  abnormality other than soft tissue swelling over the right maxilla.  This is where her ecchymosis is located.  No sign of cellulitis.  Patient will be discharged home with outpatient orthopedic follow-up.  Will discharge with pain and nausea medicine as well.    SPLINT APPLICATION Date/Time: 0:10 AM Authorized by: Cyril Mourning Jas Betten Consent: Verbal consent obtained. Risks and benefits: risks, benefits and alternatives were discussed Consent given by: patient Splint applied by: orthopedic technician Location details: right wrist and right shoulder Splint type: sugar tong and sling Supplies used: fiberglass Post-procedure: The splinted body part was neurovascularly unchanged following the procedure. Patient tolerance: Patient tolerated the procedure well with no immediate complications.        Waverly Chavarria, Delice Bison, DO 05/29/17 [phone]

## 2017-05-31 DIAGNOSIS — S52571A Other intraarticular fracture of lower end of right radius, initial encounter for closed fracture: Secondary | ICD-10-CM | POA: Diagnosis not present

## 2017-05-31 DIAGNOSIS — S42291A Other displaced fracture of upper end of right humerus, initial encounter for closed fracture: Secondary | ICD-10-CM | POA: Diagnosis not present

## 2017-06-19 DIAGNOSIS — S42291A Other displaced fracture of upper end of right humerus, initial encounter for closed fracture: Secondary | ICD-10-CM | POA: Diagnosis not present

## 2017-06-19 DIAGNOSIS — S52571A Other intraarticular fracture of lower end of right radius, initial encounter for closed fracture: Secondary | ICD-10-CM | POA: Diagnosis not present

## 2017-06-26 DIAGNOSIS — S52501D Unspecified fracture of the lower end of right radius, subsequent encounter for closed fracture with routine healing: Secondary | ICD-10-CM | POA: Diagnosis not present

## 2017-06-26 DIAGNOSIS — S42214D Unspecified nondisplaced fracture of surgical neck of right humerus, subsequent encounter for fracture with routine healing: Secondary | ICD-10-CM | POA: Diagnosis not present

## 2017-07-02 DIAGNOSIS — E113312 Type 2 diabetes mellitus with moderate nonproliferative diabetic retinopathy with macular edema, left eye: Secondary | ICD-10-CM | POA: Diagnosis not present

## 2017-07-02 DIAGNOSIS — E113211 Type 2 diabetes mellitus with mild nonproliferative diabetic retinopathy with macular edema, right eye: Secondary | ICD-10-CM | POA: Diagnosis not present

## 2017-07-02 DIAGNOSIS — H35363 Drusen (degenerative) of macula, bilateral: Secondary | ICD-10-CM | POA: Diagnosis not present

## 2017-07-02 DIAGNOSIS — H35033 Hypertensive retinopathy, bilateral: Secondary | ICD-10-CM | POA: Diagnosis not present

## 2017-07-04 ENCOUNTER — Ambulatory Visit: Payer: Medicare Other | Admitting: Nurse Practitioner

## 2017-07-09 DIAGNOSIS — I10 Essential (primary) hypertension: Secondary | ICD-10-CM | POA: Diagnosis not present

## 2017-07-09 DIAGNOSIS — E538 Deficiency of other specified B group vitamins: Secondary | ICD-10-CM | POA: Diagnosis not present

## 2017-07-09 DIAGNOSIS — E039 Hypothyroidism, unspecified: Secondary | ICD-10-CM | POA: Diagnosis not present

## 2017-07-09 DIAGNOSIS — E119 Type 2 diabetes mellitus without complications: Secondary | ICD-10-CM | POA: Diagnosis not present

## 2017-07-10 DIAGNOSIS — S42214D Unspecified nondisplaced fracture of surgical neck of right humerus, subsequent encounter for fracture with routine healing: Secondary | ICD-10-CM | POA: Diagnosis not present

## 2017-07-10 DIAGNOSIS — S52501D Unspecified fracture of the lower end of right radius, subsequent encounter for closed fracture with routine healing: Secondary | ICD-10-CM | POA: Diagnosis not present

## 2017-07-16 DIAGNOSIS — S52501D Unspecified fracture of the lower end of right radius, subsequent encounter for closed fracture with routine healing: Secondary | ICD-10-CM | POA: Diagnosis not present

## 2017-07-16 DIAGNOSIS — S42214D Unspecified nondisplaced fracture of surgical neck of right humerus, subsequent encounter for fracture with routine healing: Secondary | ICD-10-CM | POA: Diagnosis not present

## 2017-07-17 DIAGNOSIS — S42291A Other displaced fracture of upper end of right humerus, initial encounter for closed fracture: Secondary | ICD-10-CM | POA: Diagnosis not present

## 2017-07-17 DIAGNOSIS — S52571A Other intraarticular fracture of lower end of right radius, initial encounter for closed fracture: Secondary | ICD-10-CM | POA: Diagnosis not present

## 2017-07-23 DIAGNOSIS — S52501D Unspecified fracture of the lower end of right radius, subsequent encounter for closed fracture with routine healing: Secondary | ICD-10-CM | POA: Diagnosis not present

## 2017-07-23 DIAGNOSIS — S42214D Unspecified nondisplaced fracture of surgical neck of right humerus, subsequent encounter for fracture with routine healing: Secondary | ICD-10-CM | POA: Diagnosis not present

## 2017-07-25 DIAGNOSIS — S42214D Unspecified nondisplaced fracture of surgical neck of right humerus, subsequent encounter for fracture with routine healing: Secondary | ICD-10-CM | POA: Diagnosis not present

## 2017-07-25 DIAGNOSIS — S52501D Unspecified fracture of the lower end of right radius, subsequent encounter for closed fracture with routine healing: Secondary | ICD-10-CM | POA: Diagnosis not present

## 2017-07-30 DIAGNOSIS — S42214D Unspecified nondisplaced fracture of surgical neck of right humerus, subsequent encounter for fracture with routine healing: Secondary | ICD-10-CM | POA: Diagnosis not present

## 2017-07-30 DIAGNOSIS — S52501D Unspecified fracture of the lower end of right radius, subsequent encounter for closed fracture with routine healing: Secondary | ICD-10-CM | POA: Diagnosis not present

## 2017-07-31 ENCOUNTER — Ambulatory Visit: Payer: Medicare Other | Admitting: Nurse Practitioner

## 2017-08-01 ENCOUNTER — Ambulatory Visit: Payer: Medicare Other | Admitting: Podiatry

## 2017-08-01 DIAGNOSIS — S42214D Unspecified nondisplaced fracture of surgical neck of right humerus, subsequent encounter for fracture with routine healing: Secondary | ICD-10-CM | POA: Diagnosis not present

## 2017-08-01 DIAGNOSIS — S52501D Unspecified fracture of the lower end of right radius, subsequent encounter for closed fracture with routine healing: Secondary | ICD-10-CM | POA: Diagnosis not present

## 2017-08-02 ENCOUNTER — Ambulatory Visit (INDEPENDENT_AMBULATORY_CARE_PROVIDER_SITE_OTHER): Payer: Medicare Other | Admitting: Endocrinology

## 2017-08-02 ENCOUNTER — Encounter: Payer: Self-pay | Admitting: Endocrinology

## 2017-08-02 VITALS — BP 134/70 | HR 71 | Ht 63.5 in | Wt 177.0 lb

## 2017-08-02 DIAGNOSIS — E1165 Type 2 diabetes mellitus with hyperglycemia: Secondary | ICD-10-CM

## 2017-08-02 DIAGNOSIS — E063 Autoimmune thyroiditis: Secondary | ICD-10-CM

## 2017-08-02 DIAGNOSIS — I10 Essential (primary) hypertension: Secondary | ICD-10-CM | POA: Diagnosis not present

## 2017-08-02 DIAGNOSIS — Z794 Long term (current) use of insulin: Secondary | ICD-10-CM | POA: Diagnosis not present

## 2017-08-02 NOTE — Progress Notes (Signed)
[name] 84 y.o.            Reason for Appointment: Diabetes follow-up   History of Present Illness   Diagnosis: Type 2 DIABETES MELITUS, date of diagnosis:   2000   She has been on insulin since 2004 with difficulty controlling adequately because of compliance with diet and variability in blood sugars. Her previous records are not available at present, however she has been on basal insulin with mealtime coverage since at least 2007 She has been tried on Byetta previously but did not tolerate this well She tried Victoza but didn't not benefit from Victoza 0.6 dosage; had nausea from 1.2 mg; did not continue this because of this side effect She also was given a trial of the V-go pump but she subjectively did not like this  RECENT history:    INSULIN: Toujeo 22 units in morning, HUMALOG 6  acb, 6 lunch and 4 at supper     A1c is usually around 8%, it was 8.3 in January but she did not come back for follow-up   She has the following blood sugar patterns and problems identified  She has not been seen in follow-up for 6 months  She says that she has not been very assisting with her monitoring and other day-to-day management because of her having a fall and having wrist and shoulder fracture  She also thinks her monitor is not accurate but it is appearing to be giving her fairly consistent numbers and readings are in line with what she usually has  Because of cost reasons she has not taken Invokana but also she did not feel like taking it when she had her fracture  Not clear if you takes her Humalog regularly since she is concerned about the cost and currently is asking for a sample  She has not been eating out as much but is not able to do much cooking because of her wrist fracture and is likely eating more carbohydrates with bread  FASTING readings are higher in the last 3 weeks, previously near normal  Highest blood sugars are usually in the afternoon than before  suppertime but checking very infrequently K  Only once had a low sugar of 40 late at night last month  She has not been motivated to do any walking or exercise  Oral hypoglycemic drugs: Metformin er 750 mg at supper, off Invokamet 50/1000  for the last 5 weeks:   Marland Kitchen         Monitors blood glucose:  about 3 times daily     Glucometer: One Touch Verio.          Blood Glucose readings from download:   Mean values apply above for all meters except median for One Touch  PRE-MEAL Fasting Lunch Dinner Bedtime Overall  Glucose range:  102-217   147-387  40-159   Mean/median:  155   255   159+/-72   POST-MEAL PC Breakfast PC Lunch PC Dinner  Glucose range:    195-261  Mean/median:      Previous readings   Mean values apply above for all meters except median for One Touch  PRE-MEAL Fasting Lunch Dinner Bedtime Overall  Glucose range: 93-198 118-368  121-342   65-3 91    Mean/median: 130 240  221  194  161+/-71    Meals: 3 meals per day:  breakfast is At 7 am;  lunch 12-2 pm, dinner 5 pm Intake is quite variable at different meals, usually  lighter supper     Wt Readings from Last 3 Encounters:  08/02/17 177 lb (80.3 kg)  04/04/17 175 lb (79.4 kg)  01/26/17 174 lb 6.4 oz (79.1 kg)    Lab Results  Component Value Date   HGBA1C 8.3 (H) 05/24/2017   HGBA1C 8.1 (H) 01/22/2017   HGBA1C 9.0 (H) 10/17/2016   Lab Results  Component Value Date   MICROALBUR 37.8 (H) 05/24/2017   LDLCALC 68 11/29/2016   CREATININE 0.93 05/24/2017    No visits with results within 1 Week(s) from this visit.  Latest known visit with results is:  Admission on 05/29/2017, Discharged on 05/29/2017  Component Date Value Ref Range Status  . Glucose-Capillary 05/28/2017 230* 65 - 99 mg/dL Final  . Glucose-Capillary 05/28/2017 136* 65 - 99 mg/dL Final  . Glucose-Capillary 05/28/2017 191* 65 - 99 mg/dL Final     Allergies as of 08/02/2017   No Known Allergies     Medication List        Accurate as of  08/02/17  1:05 PM. Always use your most recent med list.          aspirin EC 81 MG tablet Take 1 tablet (81 mg total) by mouth daily.   B-D UF III MINI PEN NEEDLES 31G X 5 MM Misc Generic drug:  Insulin Pen Needle USE 3 PER DAY   benazepril 20 MG tablet Commonly known as:  LOTENSIN Take 20 mg by mouth daily.   carvedilol 12.5 MG tablet Commonly known as:  COREG TAKE 1 TABLET BY MOUTH TWICE A DAY   carvedilol 12.5 MG tablet Commonly known as:  COREG Take 1 tablet (12.5 mg total) by mouth 2 (two) times daily with a meal.   cholecalciferol 1000 units tablet Commonly known as:  VITAMIN D Take 1,000 Units by mouth daily.   Co Q-10 100 MG Caps Take 1 capsule by mouth 2 (two) times daily.   docusate sodium 100 MG capsule Commonly known as:  COLACE Take 1 capsule (100 mg total) by mouth every 12 (twelve) hours.   ferrous sulfate 325 (65 FE) MG tablet Take 325 mg by mouth daily with breakfast.   furosemide 20 MG tablet Commonly known as:  LASIX TAKE 1 TABLET (20 MG TOTAL) BY MOUTH DAILY AS NEEDED FOR FLUID OR EDEMA (OR WEIGHT GAIN).   glucose blood test strip Commonly known as:  ONETOUCH VERIO Use to test blood sugar 3 times daily Dx code E11.65   Insulin Glargine 300 UNIT/ML Sopn Commonly known as:  TOUJEO SOLOSTAR Inject 24 Units into the skin daily.   insulin lispro 100 UNIT/ML KiwkPen Commonly known as:  HUMALOG Inject 0.06-0.08 mLs (6-8 Units total) into the skin 2 (two) times daily.   levothyroxine 75 MCG tablet Commonly known as:  SYNTHROID, LEVOTHROID TAKE 1 TABLET BY MOUTH EVERY DAY   metFORMIN 750 MG 24 hr tablet Commonly known as:  GLUCOPHAGE-XR TAKE 2 TABLETS BY MOUTH DAILY   multivitamins ther. w/minerals Tabs tablet Take 1 tablet by mouth daily.   ondansetron 4 MG disintegrating tablet Commonly known as:  ZOFRAN ODT Take 1 tablet (4 mg total) by mouth every 6 (six) hours as needed for nausea or vomiting.   oxyCODONE-acetaminophen 5-325 MG  tablet Commonly known as:  PERCOCET/ROXICET Take 1 tablet by mouth every 4 (four) hours as needed.   rosuvastatin 20 MG tablet Commonly known as:  CRESTOR TAKE 1 TABLET(20 MG) BY MOUTH DAILY   TUMS 500 MG chewable tablet Generic drug:  calcium carbonate Chew 1 tablet by mouth daily as needed for heartburn (indigestion).   VITAMIN B-12 PO Take 1 tablet by mouth daily.       Allergies: No Known Allergies  Past Medical History:  Diagnosis Date  . Aortic valve disorders    S/P AVR 06/2012  . Arthritis   . Asthma    AS CHILD   . Depression   . Diabetes mellitus   . Heart murmur   . Hyperlipidemia   . Hypertension   . Hypothyroidism   . Ischemic heart disease    with prior CABG x 2 in 2014 along with AVR  . Lumbago   . Shortness of breath   . Thyroid disease     Past Surgical History:  Procedure Laterality Date  . AORTIC VALVE REPLACEMENT N/A 06/25/2012   Procedure: AORTIC VALVE REPLACEMENT (AVR);  Surgeon: Grace Isaac, MD;  Location: [facility];  Service: Open Heart Surgery;  Laterality: N/A;  . BREAST SURGERY     LT BREAST BX BENIGN  . CORONARY ARTERY BYPASS GRAFT N/A 06/25/2012   Procedure: Coronary artery bypass graft  times two using left internal mammary artery and right leg greater saphenous vein;  Surgeon: Grace Isaac, MD;  Location: [facility];  Service: Open Heart Surgery;  Laterality: N/A;  . INTRAOPERATIVE TRANSESOPHAGEAL ECHOCARDIOGRAM N/A 06/25/2012   Procedure: INTRAOPERATIVE TRANSESOPHAGEAL ECHOCARDIOGRAM;  Surgeon: Grace Isaac, MD;  Location: [facility];  Service: Open Heart Surgery;  Laterality: N/A;  . TUBAL LIGATION      Family History  Problem Relation Age of Onset  . Heart disease Father     Social History:  reports that she has never smoked. She has never used smokeless tobacco. She reports that she does not drink alcohol or use drugs.  Review of Systems:   HYPERTENSION: Blood pressure is usually followed by PCP Recent blood pressure  readings better  BP Readings from Last 3 Encounters:  08/02/17 134/70  05/29/17 128/69  05/28/17 (!) 196/82     HYPOTHYROIDISM: She has had long-standing hypothyroidism  She is compliant with her levothyroxine 75 g dose    Lab Results  Component Value Date   TSH 0.92 01/22/2017   TSH 1.45 07/17/2016   TSH 1.74 02/02/2016   FREET4 1.13 06/02/2015   FREET4 1.07 03/11/2014   FREET4 1.01 09/25/2013    HYPERLIPIDEMIA: The lipid abnormality consists of elevated LDL; has been  controlled with Crestor 20 mg LDL as below      Lab Results  Component Value Date   CHOL 140 11/29/2016   HDL 55 11/29/2016   LDLCALC 68 11/29/2016   LDLDIRECT 102.8 11/28/2013   TRIG 84 11/29/2016   CHOLHDL 2.5 11/29/2016  '    She does see a podiatrist regularly 1/19  Last foot exam Here was in 12/16: Diabetic foot exam shows normal monofilament sensation in the toes and plantar surfaces, no skin lesions or ulcers on the feet and normal pedal pulses    Examination:   BP 134/70 (BP Location: Left Arm, Patient Position: Sitting, Cuff Size: Normal)   Pulse 71   Ht 5' 3.5" (1.613 m)   Wt 177 lb (80.3 kg)   SpO2 97%   BMI 30.86 kg/m   Body mass index is 30.86 kg/m.    ASSESSMENT/ PLAN:   Diabetes type 2, uncontrolled   See history of present illness for a discussion of the blood sugar patterns, recent management and problems identified  A1c is 8.3  as of January and she has not had any labs since then  She is coming back after 6 months for her follow-up  She is completely irregular with her blood sugar monitoring during the day and only checking fasting readings Also she is concerned about the accuracy of her meter but discussed that her blood sugars are in line with expectations She is not taking her Invokamet and her blood sugar started going up in the morning after she stopped this  Currently not eating balanced meals or doing any cooking at home and may also be regular with her  mealtime insulin because of cost Not exercising Discussed day-to-day management of her diabetes, insulin, diet, blood sugar monitoring and access to insulin regimen   Recommendations:  Continue to use the One Touch Verio meter that she has and replace batteries when she needs to  She does not need to check her fasting reading every day but concentrate on blood sugars after meals so that she can help also advised her on the insulin doses for various meals  She also needs to have less carbohydrate at meals especially lunchtime  For now increase lunchtime Humalog to 8 units  She was advised to check on the patient assistance for Humalog  Increase Toujeo by 2 units for now  Restart Invokamet in the morning  Regular walking for exercise  Follow-up in 4-6 weeks for short-term reevaluation  HYPERTENSION: Better controlled with increasing Lotensin   Hypothyroidism: TSH to be rechecked   Patient Instructions  Take 8 Humalog at lunch  24 Toujeo in am  Check blood sugars on waking up  5/7   Also check blood sugars about 2 hours after a meal and do this after different meals by rotation  Recommended blood sugar levels on waking up is 90-130 and about 2 hours after meal is 130-160  Please bring your blood sugar monitor to each visit, thank you    Counseling time on subjects discussed in assessment and plan sections is over 50% of today's 25 minute visit    Elayne Snare 08/02/2017, 1:05 PM   Note: This office note was prepared with Dragon voice recognition system technology. Any transcriptional errors that result from this process are unintentional.

## 2017-08-02 NOTE — Patient Instructions (Addendum)
Take 8 Humalog at lunch  24 Toujeo in am  Check blood sugars on waking up  5/7   Also check blood sugars about 2 hours after a meal and do this after different meals by rotation  Recommended blood sugar levels on waking up is 90-130 and about 2 hours after meal is 130-160  Please bring your blood sugar monitor to each visit, thank you

## 2017-08-07 DIAGNOSIS — S42214D Unspecified nondisplaced fracture of surgical neck of right humerus, subsequent encounter for fracture with routine healing: Secondary | ICD-10-CM | POA: Diagnosis not present

## 2017-08-07 DIAGNOSIS — S52501D Unspecified fracture of the lower end of right radius, subsequent encounter for closed fracture with routine healing: Secondary | ICD-10-CM | POA: Diagnosis not present

## 2017-08-09 DIAGNOSIS — S52501D Unspecified fracture of the lower end of right radius, subsequent encounter for closed fracture with routine healing: Secondary | ICD-10-CM | POA: Diagnosis not present

## 2017-08-09 DIAGNOSIS — S42214D Unspecified nondisplaced fracture of surgical neck of right humerus, subsequent encounter for fracture with routine healing: Secondary | ICD-10-CM | POA: Diagnosis not present

## 2017-08-13 DIAGNOSIS — H35363 Drusen (degenerative) of macula, bilateral: Secondary | ICD-10-CM | POA: Diagnosis not present

## 2017-08-13 DIAGNOSIS — E113211 Type 2 diabetes mellitus with mild nonproliferative diabetic retinopathy with macular edema, right eye: Secondary | ICD-10-CM | POA: Diagnosis not present

## 2017-08-13 DIAGNOSIS — E113312 Type 2 diabetes mellitus with moderate nonproliferative diabetic retinopathy with macular edema, left eye: Secondary | ICD-10-CM | POA: Diagnosis not present

## 2017-08-13 DIAGNOSIS — H35033 Hypertensive retinopathy, bilateral: Secondary | ICD-10-CM | POA: Diagnosis not present

## 2017-08-14 DIAGNOSIS — S52501D Unspecified fracture of the lower end of right radius, subsequent encounter for closed fracture with routine healing: Secondary | ICD-10-CM | POA: Diagnosis not present

## 2017-08-14 DIAGNOSIS — S42214D Unspecified nondisplaced fracture of surgical neck of right humerus, subsequent encounter for fracture with routine healing: Secondary | ICD-10-CM | POA: Diagnosis not present

## 2017-08-15 ENCOUNTER — Encounter: Payer: Self-pay | Admitting: Podiatry

## 2017-08-15 ENCOUNTER — Ambulatory Visit (INDEPENDENT_AMBULATORY_CARE_PROVIDER_SITE_OTHER): Payer: Medicare Other | Admitting: Podiatry

## 2017-08-15 DIAGNOSIS — B351 Tinea unguium: Secondary | ICD-10-CM | POA: Diagnosis not present

## 2017-08-15 DIAGNOSIS — E1151 Type 2 diabetes mellitus with diabetic peripheral angiopathy without gangrene: Secondary | ICD-10-CM

## 2017-08-15 DIAGNOSIS — M79676 Pain in unspecified toe(s): Secondary | ICD-10-CM | POA: Diagnosis not present

## 2017-08-15 NOTE — Progress Notes (Signed)
Complaint:  Visit Type: Patient returns to my office for continued preventative foot care services. Complaint: Patient states" my nails have grown long and thick and become painful to walk and wear shoes" Patient has been diagnosed with DM with no foot complications. The patient presents for preventative foot care services. No changes to ROS.  Patient has injured right arm and shoulder.  Podiatric Exam: Vascular: dorsalis pedis are palpable bilateral.  Posterior tibial pulses are not palpable.Capillary return is immediate. Temperature gradient is WNL. Skin turgor WNL  Sensorium: Diminished  Semmes Weinstein monofilament test. Normal tactile sensation bilaterally. Nail Exam: Pt has thick disfigured discolored nails with subungual debris noted bilateral entire nail hallux through fifth toenails Ulcer Exam: There is no evidence of ulcer or pre-ulcerative changes or infection. Orthopedic Exam: Muscle tone and strength are WNL. No limitations in general ROM. No crepitus or effusions noted. Foot type and digits show no abnormalities. Bony prominences are unremarkable. Skin: No Porokeratosis asymptomatic.Marland Kitchen No infection or ulcers  Diagnosis:  Onychomycosis, , Pain in right toe, pain in left toes  Treatment & Plan Procedures and Treatment: Consent by patient was obtained for treatment procedures.   Debridement of mycotic and hypertrophic toenails, 1 through 5 bilateral and clearing of subungual debris. No ulceration, no infection noted.  Return Visit-Office Procedure: Patient instructed to return to the office for a follow up visit 3 months for continued evaluation and treatment.    Gardiner Barefoot DPM

## 2017-08-16 DIAGNOSIS — S52571A Other intraarticular fracture of lower end of right radius, initial encounter for closed fracture: Secondary | ICD-10-CM | POA: Diagnosis not present

## 2017-08-16 DIAGNOSIS — S42291A Other displaced fracture of upper end of right humerus, initial encounter for closed fracture: Secondary | ICD-10-CM | POA: Diagnosis not present

## 2017-08-21 DIAGNOSIS — S52501D Unspecified fracture of the lower end of right radius, subsequent encounter for closed fracture with routine healing: Secondary | ICD-10-CM | POA: Diagnosis not present

## 2017-08-21 DIAGNOSIS — S42214D Unspecified nondisplaced fracture of surgical neck of right humerus, subsequent encounter for fracture with routine healing: Secondary | ICD-10-CM | POA: Diagnosis not present

## 2017-08-23 DIAGNOSIS — S52501D Unspecified fracture of the lower end of right radius, subsequent encounter for closed fracture with routine healing: Secondary | ICD-10-CM | POA: Diagnosis not present

## 2017-08-23 DIAGNOSIS — S42214D Unspecified nondisplaced fracture of surgical neck of right humerus, subsequent encounter for fracture with routine healing: Secondary | ICD-10-CM | POA: Diagnosis not present

## 2017-08-28 DIAGNOSIS — S42214D Unspecified nondisplaced fracture of surgical neck of right humerus, subsequent encounter for fracture with routine healing: Secondary | ICD-10-CM | POA: Diagnosis not present

## 2017-08-28 DIAGNOSIS — S52501D Unspecified fracture of the lower end of right radius, subsequent encounter for closed fracture with routine healing: Secondary | ICD-10-CM | POA: Diagnosis not present

## 2017-08-29 ENCOUNTER — Encounter: Payer: Self-pay | Admitting: Nurse Practitioner

## 2017-08-29 ENCOUNTER — Ambulatory Visit (INDEPENDENT_AMBULATORY_CARE_PROVIDER_SITE_OTHER): Payer: Medicare Other | Admitting: Nurse Practitioner

## 2017-08-29 VITALS — BP 120/70 | HR 77 | Ht 63.5 in | Wt 174.0 lb

## 2017-08-29 DIAGNOSIS — I259 Chronic ischemic heart disease, unspecified: Secondary | ICD-10-CM | POA: Diagnosis not present

## 2017-08-29 DIAGNOSIS — Z952 Presence of prosthetic heart valve: Secondary | ICD-10-CM

## 2017-08-29 DIAGNOSIS — E7849 Other hyperlipidemia: Secondary | ICD-10-CM | POA: Diagnosis not present

## 2017-08-29 DIAGNOSIS — Z951 Presence of aortocoronary bypass graft: Secondary | ICD-10-CM | POA: Diagnosis not present

## 2017-08-29 DIAGNOSIS — I10 Essential (primary) hypertension: Secondary | ICD-10-CM | POA: Diagnosis not present

## 2017-08-29 LAB — CBC
Hematocrit: 32.7 % — ABNORMAL LOW (ref 34.0–46.6)
Hemoglobin: 10.7 g/dL — ABNORMAL LOW (ref 11.1–15.9)
MCH: 30.1 pg (ref 26.6–33.0)
MCHC: 32.7 g/dL (ref 31.5–35.7)
MCV: 92 fL (ref 79–97)
Platelets: 215 10*3/uL (ref 150–379)
RBC: 3.55 x10E6/uL — ABNORMAL LOW (ref 3.77–5.28)
RDW: 14.3 % (ref 12.3–15.4)
WBC: 4.3 10*3/uL (ref 3.4–10.8)

## 2017-08-29 LAB — LIPID PANEL
Chol/HDL Ratio: 2.6 ratio (ref 0.0–4.4)
Cholesterol, Total: 143 mg/dL (ref 100–199)
HDL: 56 mg/dL (ref >39)
LDL Calculated: 76 mg/dL (ref 0–99)
Triglycerides: 57 mg/dL (ref 0–149)
VLDL Cholesterol Cal: 11 mg/dL (ref 5–40)

## 2017-08-29 LAB — BASIC METABOLIC PANEL
BUN/Creatinine Ratio: 14 (ref 12–28)
BUN: 15 mg/dL (ref 8–27)
CO2: 26 mmol/L (ref 20–29)
Calcium: 10.3 mg/dL (ref 8.7–10.3)
Chloride: 100 mmol/L (ref 96–106)
Creatinine, Ser: 1.05 mg/dL — ABNORMAL HIGH (ref 0.57–1.00)
GFR calc Af Amer: 57 mL/min/{1.73_m2} — ABNORMAL LOW (ref >59)
GFR calc non Af Amer: 50 mL/min/{1.73_m2} — ABNORMAL LOW (ref >59)
Glucose: 77 mg/dL (ref 65–99)
Potassium: 4 mmol/L (ref 3.5–5.2)
Sodium: 140 mmol/L (ref 134–144)

## 2017-08-29 LAB — HEPATIC FUNCTION PANEL
ALT: 12 IU/L (ref 0–32)
AST: 20 IU/L (ref 0–40)
Albumin: 4.5 g/dL (ref 3.5–4.7)
Alkaline Phosphatase: 50 IU/L (ref 39–117)
Bilirubin Total: 0.3 mg/dL (ref 0.0–1.2)
Bilirubin, Direct: 0.1 mg/dL (ref 0.00–0.40)
Total Protein: 7.6 g/dL (ref 6.0–8.5)

## 2017-08-29 LAB — TSH: TSH: 2.33 u[IU]/mL (ref 0.450–4.500)

## 2017-08-29 MED ORDER — ESCITALOPRAM OXALATE 10 MG PO TABS: 10.0000 mg | ORAL_TABLET | Freq: Every day | ORAL | 6 refills | Status: DC

## 2017-08-29 NOTE — Patient Instructions (Addendum)
We will be checking the following labs today - BMEt, CBC, HPF, lipids and TSh   Medication Instructions:    Continue with your current medicines. BUT  I am adding Lexapro 10 mg a day - this is to help with your depression. This is at your drug store.     Testing/Procedures To Be Arranged:  N/A  Follow-Up:   See me in 4 to 6 weeks    Other Special Instructions:   N/A    If you need a refill on your cardiac medications before your next appointment, please call your pharmacy.   Call the [facility] office at [phone] if you have any questions, problems or concerns.

## 2017-08-29 NOTE — Progress Notes (Signed)
CARDIOLOGY OFFICE NOTE  Date:  08/29/2017    [name] Date of Birth: 1932/10/27 Medical Record #[id number]  PCP:  Rogers Blocker, MD  Cardiologist:  Servando Snare Naser    Chief Complaint  Patient presents with  . Coronary Artery Disease  . Cardiac Valve Problem    4 month check - seen for Dr. Acie Fredrickson    History of Present Illness: [name] is a 84 y.o. female who presents today for a follow up visit. This is a 4 month check. Seen for Dr. Acie Fredrickson. She now primarily follows with me.   She has a history of DM, prior AVR and CABG x 2 per EBG back in 2014 - this was with a LIMA to the LAD and SVG to the LCX, with a pericardial Edwards tissue valve #21 mm. Did have post op atrial fib - was on amiodarone. Other issues include HLD, HTN, DM, depression and hypothyroidism. Myoview ok from 06/2013. Last echo 12/2014.   I saw her back in September of 2016 for a work in visit - she was short of breath. Labs looked ok. Blood count actually improved. Echo updated - did have elevated filling pressures but otherwise unchanged. Prosthetic AV ok. Normal EF and had grade 1 diastolic dysfunction. I wanted to put her on low dose Lasix but she did not wish to proceed due to having frequent urination when on Invokana.   Seen in November of 2016 by me and she was doing ok- not really clear as to why she had improved but she had. She had talked with Dr. Dwyane Dee and he also encouraged prn diuretic. She was agreeable to trying. Seen several times back since- BP still low and Plendil had been stopped altogether. Shehas tried to work [employer].Does have some chronic fatigue issues.Last seen back in December - she was very depressed. Cardiac status was ok.   Back here today. Here with her sister - they live together.Remains depressed "because I can't do anything". She has had a recent fall - tripped - broke her wrist. She is crying significantly. She did not discuss her depression with her  PCP. No chest pain. Not short of breath. Getting more inactive. Currently getting some PT for her shoulder and wrist. She did get back to church for the first time this past weekend.   Past Medical History:  Diagnosis Date  . Aortic valve disorders    S/P AVR 06/2012  . Arthritis   . Asthma    AS CHILD   . Depression   . Diabetes mellitus   . Heart murmur   . Hyperlipidemia   . Hypertension   . Hypothyroidism   . Ischemic heart disease    with prior CABG x 2 in 2014 along with AVR  . Lumbago   . Shortness of breath   . Thyroid disease     Past Surgical History:  Procedure Laterality Date  . AORTIC VALVE REPLACEMENT N/A 06/25/2012   Procedure: AORTIC VALVE REPLACEMENT (AVR);  Surgeon: Grace Isaac, MD;  Location: [facility];  Service: Open Heart Surgery;  Laterality: N/A;  . BREAST SURGERY     LT BREAST BX BENIGN  . CORONARY ARTERY BYPASS GRAFT N/A 06/25/2012   Procedure: Coronary artery bypass graft  times two using left internal mammary artery and right leg greater saphenous vein;  Surgeon: Grace Isaac, MD;  Location: [facility];  Service: Open Heart Surgery;  Laterality: N/A;  . INTRAOPERATIVE TRANSESOPHAGEAL  ECHOCARDIOGRAM N/A 06/25/2012   Procedure: INTRAOPERATIVE TRANSESOPHAGEAL ECHOCARDIOGRAM;  Surgeon: Grace Isaac, MD;  Location: [facility];  Service: Open Heart Surgery;  Laterality: N/A;  . TUBAL LIGATION       Medications: Current Meds  Medication Sig  . aspirin EC 81 MG tablet Take 1 tablet (81 mg total) by mouth daily.  . B-D UF III MINI PEN NEEDLES 31G X 5 MM MISC USE 3 PER DAY  . benazepril (LOTENSIN) 20 MG tablet Take 20 mg by mouth daily.   . calcium carbonate (TUMS) 500 MG chewable tablet Chew 1 tablet by mouth daily as needed for heartburn (indigestion).  . carvedilol (COREG) 12.5 MG tablet Take 1 tablet (12.5 mg total) by mouth 2 (two) times daily with a meal.  . cholecalciferol (VITAMIN D) 1000 UNITS tablet Take 1,000 Units by mouth daily.  . Coenzyme Q10  (CO Q-10) 100 MG CAPS Take 1 capsule by mouth 2 (two) times daily.  . ferrous sulfate 325 (65 FE) MG tablet Take 325 mg by mouth daily with breakfast.  . furosemide (LASIX) 20 MG tablet TAKE 1 TABLET (20 MG TOTAL) BY MOUTH DAILY AS NEEDED FOR FLUID OR EDEMA (OR WEIGHT GAIN).  Marland Kitchen glucose blood (ONETOUCH VERIO) test strip Use to test blood sugar 3 times daily Dx code E11.65  . Insulin Glargine (TOUJEO SOLOSTAR) 300 UNIT/ML SOPN Inject 24 Units into the skin daily.  . insulin lispro (HUMALOG) 100 UNIT/ML KiwkPen Inject 0.06-0.08 mLs (6-8 Units total) into the skin 2 (two) times daily. (Patient taking differently: Inject 3-8 Units into the skin See admin instructions. Takes 6 units at breakfast, takes 6-8 units at lunch and takes 3 units at dinner)  . levothyroxine (SYNTHROID, LEVOTHROID) 75 MCG tablet TAKE 1 TABLET BY MOUTH EVERY DAY  . metFORMIN (GLUCOPHAGE-XR) 750 MG 24 hr tablet TAKE 2 TABLETS BY MOUTH DAILY  . Multiple Vitamins-Minerals (MULTIVITAMINS THER. W/MINERALS) TABS Take 1 tablet by mouth daily.  . ondansetron (ZOFRAN ODT) 4 MG disintegrating tablet Take 1 tablet (4 mg total) by mouth every 6 (six) hours as needed for nausea or vomiting.  . rosuvastatin (CRESTOR) 20 MG tablet TAKE 1 TABLET(20 MG) BY MOUTH DAILY  . [DISCONTINUED] carvedilol (COREG) 12.5 MG tablet TAKE 1 TABLET BY MOUTH TWICE A DAY     Allergies: No Known Allergies  Social History: The patient  reports that she has never smoked. She has never used smokeless tobacco. She reports that she does not drink alcohol or use drugs.   Family History: The patient's family history includes Heart disease in her father.   Review of Systems: Please see the history of present illness.   Otherwise, the review of systems is positive for none.   All other systems are reviewed and negative.   Physical Exam: VS:  BP 120/70 (BP Location: Left Arm, Patient Position: Sitting, Cuff Size: Normal)   Pulse 77   Ht 5' 3.5" (1.613 m)   Wt  174 lb (78.9 kg)   SpO2 100% Comment: at rest  BMI 30.34 kg/m  .  BMI Body mass index is 30.34 kg/m.  Wt Readings from Last 3 Encounters:  08/29/17 174 lb (78.9 kg)  08/02/17 177 lb (80.3 kg)  04/04/17 175 lb (79.4 kg)    General: Pleasant. She is very tearful/crying today. Alert and in no acute distress.   HEENT: Normal.  Neck: Supple, no JVD, carotid bruits, or masses noted.  Cardiac: Regular rate and rhythm. Valve sounds ok. No edema.  Respiratory:  Lungs are clear to auscultation bilaterally with normal work of breathing.  GI: Soft and nontender.  MS: No deformity or atrophy. Gait and ROM intact.  Skin: Warm and dry. Color is normal.  Neuro:  Strength and sensation are intact and no gross focal deficits noted.  Psych: Alert, appropriate and with normal affect.   LABORATORY DATA:  EKG:  EKG is not ordered today.  Lab Results  Component Value Date   WBC 6.1 04/04/2017   HGB 10.4 (L) 04/04/2017   HCT 33.0 (L) 04/04/2017   PLT 221 04/04/2017   GLUCOSE 161 (H) 05/24/2017   CHOL 140 11/29/2016   TRIG 84 11/29/2016   HDL 55 11/29/2016   LDLDIRECT 102.8 11/28/2013   LDLCALC 68 11/29/2016   ALT 12 05/24/2017   AST 20 05/24/2017   NA 137 05/24/2017   K 3.6 05/24/2017   CL 101 05/24/2017   CREATININE 0.93 05/24/2017   BUN 20 05/24/2017   CO2 29 05/24/2017   TSH 0.92 01/22/2017   INR 1.57 (H) 06/25/2012   HGBA1C 8.3 (H) 05/24/2017   MICROALBUR 37.8 (H) 05/24/2017     BNP (last 3 results) No results for input(s): BNP in the last 8760 hours.  ProBNP (last 3 results) No results for input(s): PROBNP in the last 8760 hours.   Other Studies Reviewed Today:  Echo Study Conclusions 11/2016  - Left ventricle: The cavity size was normal. Systolic function was normal. The estimated ejection fraction was in the range of 55% to 60%. Wall motion was normal; there were no regional wall motion abnormalities. There was an increased relative contribution of atrial  contraction to ventricular filling. Doppler parameters are consistent with abnormal left ventricular relaxation (grade 1 diastolic dysfunction). - Aortic valve: A pericardial bioprosthesis was present and functioning normally. Mean gradient (S): 9 mm Hg. Peak gradient (S): 17 mm Hg. - Mitral valve: Calcified annulus. Mildly thickened leaflets . There was mild to moderate regurgitation. - Left atrium: The atrium was mildly dilated. - Atrial septum: There was increased thickness of the septum, consistent with lipomatous hypertrophy. - Pulmonary arteries: PA peak pressure: 42 mm Hg (S).  Impressions:  - The right ventricular systolic pressure was increased consistent with moderate pulmonary hypertension.   Myoview Impression from 06/2013 Exercise Capacity: Lexiscan with no exercise. BP Response: Normal blood pressure response. Clinical Symptoms: No significant symptoms noted. ECG Impression: There are scattered PVCs. Comparison with Prior Nuclear Study: No previous nuclear study performed  Overall Impression: Low risk stress nuclear study small fixed distal anterior artifact which could be breast attenuation or other artifact.  LV Ejection Fraction: 66%. LV Wall Motion: Normal Wall Motion  [name], MD, [employer]    Assessment/Plan:  1. Depression - I think this is her primary issue - she is agreeable to trying low dose Lexapro.   2. CAD - s/p CABG 06-27-12 -last Myoview stable from 2015. She has no active chest pain.   3. Aortic stenosis with prior tissue valve AVR - she had her last echo back in August - this was stable.   4. HTN -BP is ok on her current regimen.   5. Obesity- not really discussed today - today's visit centered more around depression.   6. Fall   Current medicines are reviewed with the patient today.  The patient does not have concerns regarding medicines other than what has been noted above.  The following  changes have been made:  See above.  Labs/ tests ordered  today include:    Orders Placed This Encounter  Procedures  . Basic metabolic panel  . CBC  . Hepatic function panel  . Lipid panel  . TSH     Disposition:   FU with me in 4 to 6 weeks.   Patient is agreeable to this plan and will call if any problems develop in the interim.   SignedTruitt Merle, NP  08/29/2017 8:59 AM  [facility] [address] [place], [place]  [address] Phone: [phone] Fax: [phone]

## 2017-08-30 DIAGNOSIS — S52501D Unspecified fracture of the lower end of right radius, subsequent encounter for closed fracture with routine healing: Secondary | ICD-10-CM | POA: Diagnosis not present

## 2017-08-30 DIAGNOSIS — S42214D Unspecified nondisplaced fracture of surgical neck of right humerus, subsequent encounter for fracture with routine healing: Secondary | ICD-10-CM | POA: Diagnosis not present

## 2017-09-04 DIAGNOSIS — S52501D Unspecified fracture of the lower end of right radius, subsequent encounter for closed fracture with routine healing: Secondary | ICD-10-CM | POA: Diagnosis not present

## 2017-09-04 DIAGNOSIS — S42214D Unspecified nondisplaced fracture of surgical neck of right humerus, subsequent encounter for fracture with routine healing: Secondary | ICD-10-CM | POA: Diagnosis not present

## 2017-09-06 DIAGNOSIS — S52501D Unspecified fracture of the lower end of right radius, subsequent encounter for closed fracture with routine healing: Secondary | ICD-10-CM | POA: Diagnosis not present

## 2017-09-06 DIAGNOSIS — S42214D Unspecified nondisplaced fracture of surgical neck of right humerus, subsequent encounter for fracture with routine healing: Secondary | ICD-10-CM | POA: Diagnosis not present

## 2017-09-09 ENCOUNTER — Other Ambulatory Visit: Payer: Self-pay | Admitting: Endocrinology

## 2017-09-10 ENCOUNTER — Other Ambulatory Visit: Payer: Self-pay | Admitting: Endocrinology

## 2017-09-11 ENCOUNTER — Other Ambulatory Visit (INDEPENDENT_AMBULATORY_CARE_PROVIDER_SITE_OTHER): Payer: Medicare Other

## 2017-09-11 DIAGNOSIS — Z794 Long term (current) use of insulin: Secondary | ICD-10-CM | POA: Diagnosis not present

## 2017-09-11 DIAGNOSIS — E1165 Type 2 diabetes mellitus with hyperglycemia: Secondary | ICD-10-CM | POA: Diagnosis not present

## 2017-09-11 LAB — T4, FREE: FREE T4: 1.01 ng/dL (ref 0.60–1.60)

## 2017-09-11 LAB — HEMOGLOBIN A1C: Hgb A1c MFr Bld: 8.1 % — ABNORMAL HIGH (ref 4.6–6.5)

## 2017-09-14 ENCOUNTER — Encounter: Payer: Medicare Other | Admitting: Endocrinology

## 2017-09-14 ENCOUNTER — Encounter: Payer: Self-pay | Admitting: Endocrinology

## 2017-09-14 NOTE — Progress Notes (Signed)
This encounter was created in error - please disregard.

## 2017-09-17 ENCOUNTER — Encounter: Payer: Self-pay | Admitting: Endocrinology

## 2017-09-17 ENCOUNTER — Ambulatory Visit (INDEPENDENT_AMBULATORY_CARE_PROVIDER_SITE_OTHER): Payer: Medicare Other | Admitting: Endocrinology

## 2017-09-17 VITALS — BP 140/82 | HR 84 | Ht 63.5 in | Wt 174.0 lb

## 2017-09-17 DIAGNOSIS — E7800 Pure hypercholesterolemia, unspecified: Secondary | ICD-10-CM | POA: Diagnosis not present

## 2017-09-17 DIAGNOSIS — I259 Chronic ischemic heart disease, unspecified: Secondary | ICD-10-CM

## 2017-09-17 DIAGNOSIS — E1165 Type 2 diabetes mellitus with hyperglycemia: Secondary | ICD-10-CM | POA: Diagnosis not present

## 2017-09-17 DIAGNOSIS — I10 Essential (primary) hypertension: Secondary | ICD-10-CM | POA: Diagnosis not present

## 2017-09-17 DIAGNOSIS — E063 Autoimmune thyroiditis: Secondary | ICD-10-CM

## 2017-09-17 DIAGNOSIS — Z794 Long term (current) use of insulin: Secondary | ICD-10-CM | POA: Diagnosis not present

## 2017-09-17 MED ORDER — CANAGLIFLOZIN-METFORMIN HCL 50-1000 MG PO TABS: 1.0000 | ORAL_TABLET | Freq: Two times a day (BID) | ORAL | 1 refills | Status: DC

## 2017-09-17 NOTE — Patient Instructions (Addendum)
Take Invokamet at Breakfast  More sugars at lunch and supper

## 2017-09-17 NOTE — Progress Notes (Signed)
[name] 84 y.o.            Reason for Appointment: Diabetes follow-up   History of Present Illness   Diagnosis: Type 2 DIABETES MELITUS, date of diagnosis:   2000   She has been on insulin since 2004 with difficulty controlling adequately because of compliance with diet and variability in blood sugars. Her previous records are not available at present, however she has been on basal insulin with mealtime coverage since at least 2007 She has been tried on Byetta previously but did not tolerate this well She tried Victoza but didn't not benefit from Victoza 0.6 dosage; had nausea from 1.2 mg; did not continue this because of this side effect She also was given a trial of the V-go pump but she subjectively did not like this  RECENT history:    INSULIN: Toujeo 22 units in morning, HUMALOG 6  acb, 6 lunch and 4 at supper    Oral hypoglycemic drugs: Metformin er 750 mg at supper, Invokamet 50/1000, half tablet   A1c is usually around 8%, it is now 8.1   She has the following blood sugar patterns and problems identified  She has not been regular with taking her Invokamet as she is afraid of side effects  She claims that when she takes a whole tablet she will have low sugars and she will only take a half a tablet  She has had some mornings when her blood sugars are significantly high and it may be that these are higher when she does not take her Invokamet  She was told to increase her Toujeo on the last visit but she has not done so  Also was told to take 8 units at lunchtime of the Humalog but she is still taking 6, she thinks she is eating smaller portions at lunch and not eating also much outside  However not clear why her A1c is higher  She has only sporadic readings later in the day and checking primarily in the morning  She is still not doing much physical activity or walking, currently doing some physical therapy  Her weight has come down slightly      .           Monitors blood glucose:  about 3 times daily     Glucometer: One Touch Verio.          Blood Glucose readings from download:   Mean values apply above for all meters except median for One Touch  PRE-MEAL Fasting Lunch Dinner Bedtime Overall  Glucose range:  103-217   160, 174    Mean/median: 158     153   POST-MEAL PC Breakfast PC Lunch PC Dinner  Glucose range:   92-360  54-166  Mean/median:   157     Previous readings:  Mean values apply above for all meters except median for One Touch  PRE-MEAL Fasting Lunch Dinner Bedtime Overall  Glucose range:  102-217   147-387  40-159   Mean/median:  155   255   159+/-72   POST-MEAL PC Breakfast PC Lunch PC Dinner  Glucose range:    195-261  Mean/median:        Meals: 3 meals per day:  breakfast is At 7 am;  lunch 12-2 pm, dinner 5 pm Intake is quite variable at different meals, usually lighter supper    Wt Readings from Last 3 Encounters:  09/17/17 174 lb (78.9 kg)  08/29/17 174 lb (78.9 kg)  08/02/17 177 lb (80.3 kg)    Lab Results  Component Value Date   HGBA1C 8.1 (H) 09/11/2017   HGBA1C 8.3 (H) 05/24/2017   HGBA1C 8.1 (H) 01/22/2017   Lab Results  Component Value Date   MICROALBUR 37.8 (H) 05/24/2017   LDLCALC 76 08/29/2017   CREATININE 1.05 (H) 08/29/2017    Lab on 09/11/2017  Component Date Value Ref Range Status  . Free T4 09/11/2017 1.01  0.60 - 1.60 ng/dL Final   Comment: Specimens from patients who are undergoing biotin therapy and /or ingesting biotin supplements may contain high levels of biotin.  The higher biotin concentration in these specimens interferes with this Free T4 assay.  Specimens that contain high levels  of biotin may cause false high results for this Free T4 assay.  Please interpret results in light of the total clinical presentation of the patient.    . Hgb A1c MFr Bld 09/11/2017 8.1* 4.6 - 6.5 % Final   Glycemic Control Guidelines for People with Diabetes:Non Diabetic:  <6%Goal of  Therapy: <7%Additional Action Suggested:  >8%      Allergies as of 09/17/2017   No Known Allergies     Medication List        Accurate as of 09/17/17 11:59 PM. Always use your most recent med list.          aspirin EC 81 MG tablet Take 1 tablet (81 mg total) by mouth daily.   B-D UF III MINI PEN NEEDLES 31G X 5 MM Misc Generic drug:  Insulin Pen Needle USE 3 PER DAY   benazepril 20 MG tablet Commonly known as:  LOTENSIN Take 20 mg by mouth daily.   Canagliflozin-metFORMIN HCl 50-1000 MG Tabs Commonly known as:  INVOKAMET Take 1 tablet by mouth 2 (two) times daily.   carvedilol 12.5 MG tablet Commonly known as:  COREG Take 1 tablet (12.5 mg total) by mouth 2 (two) times daily with a meal.   cholecalciferol 1000 units tablet Commonly known as:  VITAMIN D Take 1,000 Units by mouth daily.   Co Q-10 100 MG Caps Take 1 capsule by mouth 2 (two) times daily.   ferrous sulfate 325 (65 FE) MG tablet Take 325 mg by mouth daily with breakfast.   furosemide 20 MG tablet Commonly known as:  LASIX TAKE 1 TABLET (20 MG TOTAL) BY MOUTH DAILY AS NEEDED FOR FLUID OR EDEMA (OR WEIGHT GAIN).   glucose blood test strip Commonly known as:  ONETOUCH VERIO Use to test blood sugar 3 times daily Dx code E11.65   Insulin Glargine 300 UNIT/ML Sopn Commonly known as:  TOUJEO SOLOSTAR Inject 24 Units into the skin daily.   insulin lispro 100 UNIT/ML KiwkPen Commonly known as:  HUMALOG Inject 0.06-0.08 mLs (6-8 Units total) into the skin 2 (two) times daily.   levothyroxine 75 MCG tablet Commonly known as:  SYNTHROID, LEVOTHROID TAKE 1 TABLET BY MOUTH EVERY DAY   metFORMIN 750 MG 24 hr tablet Commonly known as:  GLUCOPHAGE-XR TAKE 2 TABLETS BY MOUTH DAILY   multivitamins ther. w/minerals Tabs tablet Take 1 tablet by mouth daily.   rosuvastatin 20 MG tablet Commonly known as:  CRESTOR TAKE 1 TABLET(20 MG) BY MOUTH DAILY   TUMS 500 MG chewable tablet Generic drug:  calcium  carbonate Chew 1 tablet by mouth daily as needed for heartburn (indigestion).       Allergies: No Known Allergies  Past Medical History:  Diagnosis Date  . Aortic valve disorders  S/P AVR 06/2012  . Arthritis   . Asthma    AS CHILD   . Depression   . Diabetes mellitus   . Heart murmur   . Hyperlipidemia   . Hypertension   . Hypothyroidism   . Ischemic heart disease    with prior CABG x 2 in 2014 along with AVR  . Lumbago   . Shortness of breath   . Thyroid disease     Past Surgical History:  Procedure Laterality Date  . AORTIC VALVE REPLACEMENT N/A 06/25/2012   Procedure: AORTIC VALVE REPLACEMENT (AVR);  Surgeon: Grace Isaac, MD;  Location: [facility];  Service: Open Heart Surgery;  Laterality: N/A;  . BREAST SURGERY     LT BREAST BX BENIGN  . CORONARY ARTERY BYPASS GRAFT N/A 06/25/2012   Procedure: Coronary artery bypass graft  times two using left internal mammary artery and right leg greater saphenous vein;  Surgeon: Grace Isaac, MD;  Location: [facility];  Service: Open Heart Surgery;  Laterality: N/A;  . INTRAOPERATIVE TRANSESOPHAGEAL ECHOCARDIOGRAM N/A 06/25/2012   Procedure: INTRAOPERATIVE TRANSESOPHAGEAL ECHOCARDIOGRAM;  Surgeon: Grace Isaac, MD;  Location: [facility];  Service: Open Heart Surgery;  Laterality: N/A;  . TUBAL LIGATION      Family History  Problem Relation Age of Onset  . Heart disease Father     Social History:  reports that she has never smoked. She has never used smokeless tobacco. She reports that she does not drink alcohol or use drugs.  Review of Systems:   HYPERTENSION: Blood pressure is usually followed by PCP Recent blood pressure readings as follows  BP Readings from Last 3 Encounters:  09/17/17 140/82  08/29/17 120/70  08/02/17 134/70     HYPOTHYROIDISM: She has had long-standing hypothyroidism  She is compliant with her levothyroxine 75 g dose  TSH has been consistent   Lab Results  Component Value Date   TSH  2.330 08/29/2017   TSH 0.92 01/22/2017   TSH 1.45 07/17/2016   FREET4 1.01 09/11/2017   FREET4 1.13 06/02/2015   FREET4 1.07 03/11/2014    HYPERLIPIDEMIA: The lipid abnormality consists of elevated LDL; has been  controlled with Crestor 20 mg LDL as below      Lab Results  Component Value Date   CHOL 143 08/29/2017   HDL 56 08/29/2017   LDLCALC 76 08/29/2017   LDLDIRECT 102.8 11/28/2013   TRIG 57 08/29/2017   CHOLHDL 2.6 08/29/2017  '    She does see a podiatrist regularly 1/19, no new findings  Last foot exam Here was in 12/16: Diabetic foot exam shows normal monofilament sensation in the toes and plantar surfaces, no skin lesions or ulcers on the feet and normal pedal pulses    Examination:   BP 140/82 (BP Location: Left Arm, Patient Position: Sitting, Cuff Size: Normal)   Pulse 84   Ht 5' 3.5" (1.613 m)   Wt 174 lb (78.9 kg)   SpO2 97%   BMI 30.34 kg/m   Body mass index is 30.34 kg/m.    ASSESSMENT/ PLAN:   Diabetes type 2, uncontrolled   See history of present illness for a discussion of the blood sugar patterns, recent management and problems identified  A1c is 8.1  She is still not following instructions for her insulin or medications consistently Although her blood sugars averaging about 150 in the morning they are variable and she does need to be more consistent with all her medications especially Invokamet which probably  helps her keep her morning sugars consistent Discussed indications for cardiovascular risk reduction indications now on Invokana and that she has taken the safely so far She does need to check her sugars after meals much more frequently and not just in the morning which she can cut back on Also recommended that she take her Invokamet in the morning instead of in the evening to provide better daytime blood sugar control reduce some of the chances of low sugars after supper which she believes it is causing Discussed blood sugar targets at  various times including after meals Also discussed that if she is having high readings after breakfast or lunch she will need to increase her Humalog to 8 units Will need to start doing some regular walking for exercise as tolerated  Follow-up in 3 months with A1c again  HYPERTENSION: Well-controlled  Hypothyroidism: TSH recently normal  LIPIDS: Appear to be well controlled on Crestor to continue  Patient Instructions  Take Invokamet at Breakfast  More sugars at lunch and supper  Counseling time on subjects discussed in assessment and plan sections is over 50% of today's 25 minute visit     Elayne Snare 09/18/2017, 11:26 AM   Note: This office note was prepared with Dragon voice recognition system technology. Any transcriptional errors that result from this process are unintentional.

## 2017-09-18 DIAGNOSIS — S42291A Other displaced fracture of upper end of right humerus, initial encounter for closed fracture: Secondary | ICD-10-CM | POA: Diagnosis not present

## 2017-09-18 DIAGNOSIS — S42214D Unspecified nondisplaced fracture of surgical neck of right humerus, subsequent encounter for fracture with routine healing: Secondary | ICD-10-CM | POA: Diagnosis not present

## 2017-09-18 DIAGNOSIS — S52501D Unspecified fracture of the lower end of right radius, subsequent encounter for closed fracture with routine healing: Secondary | ICD-10-CM | POA: Diagnosis not present

## 2017-09-18 DIAGNOSIS — S52571A Other intraarticular fracture of lower end of right radius, initial encounter for closed fracture: Secondary | ICD-10-CM | POA: Diagnosis not present

## 2017-09-26 ENCOUNTER — Other Ambulatory Visit: Payer: Self-pay | Admitting: Endocrinology

## 2017-09-26 DIAGNOSIS — S52501D Unspecified fracture of the lower end of right radius, subsequent encounter for closed fracture with routine healing: Secondary | ICD-10-CM | POA: Diagnosis not present

## 2017-09-26 DIAGNOSIS — S42214D Unspecified nondisplaced fracture of surgical neck of right humerus, subsequent encounter for fracture with routine healing: Secondary | ICD-10-CM | POA: Diagnosis not present

## 2017-09-28 ENCOUNTER — Telehealth: Payer: Self-pay | Admitting: Endocrinology

## 2017-09-28 ENCOUNTER — Other Ambulatory Visit: Payer: Self-pay

## 2017-09-28 DIAGNOSIS — S42214D Unspecified nondisplaced fracture of surgical neck of right humerus, subsequent encounter for fracture with routine healing: Secondary | ICD-10-CM | POA: Diagnosis not present

## 2017-09-28 DIAGNOSIS — S52501D Unspecified fracture of the lower end of right radius, subsequent encounter for closed fracture with routine healing: Secondary | ICD-10-CM | POA: Diagnosis not present

## 2017-09-28 MED ORDER — GLUCOSE BLOOD VI STRP: ORAL_STRIP | 0 refills | Status: DC

## 2017-09-28 NOTE — Telephone Encounter (Signed)
Patient called and issue resolved.

## 2017-09-28 NOTE — Telephone Encounter (Signed)
Patient stated she needs 150 a box of her test strips, she test 3 x a day   Pharmacy:  [facility], [place] DEA #:  [id number]  ONETOUCH VERIO test strip   Call her when sent

## 2017-10-01 DIAGNOSIS — S42214D Unspecified nondisplaced fracture of surgical neck of right humerus, subsequent encounter for fracture with routine healing: Secondary | ICD-10-CM | POA: Diagnosis not present

## 2017-10-01 DIAGNOSIS — S52501D Unspecified fracture of the lower end of right radius, subsequent encounter for closed fracture with routine healing: Secondary | ICD-10-CM | POA: Diagnosis not present

## 2017-10-03 ENCOUNTER — Ambulatory Visit: Payer: Medicare Other | Admitting: Nurse Practitioner

## 2017-10-09 DIAGNOSIS — S52501D Unspecified fracture of the lower end of right radius, subsequent encounter for closed fracture with routine healing: Secondary | ICD-10-CM | POA: Diagnosis not present

## 2017-10-09 DIAGNOSIS — S42214D Unspecified nondisplaced fracture of surgical neck of right humerus, subsequent encounter for fracture with routine healing: Secondary | ICD-10-CM | POA: Diagnosis not present

## 2017-10-09 IMAGING — MG DIGITAL SCREENING BILATERAL MAMMOGRAM WITH CAD
4 series · 4 of 4 positions shown · non-contrast
Comparison: Previous exam(s).

CLINICAL DATA: Screening.

EXAM:
DIGITAL SCREENING BILATERAL MAMMOGRAM WITH CAD

[L CC]
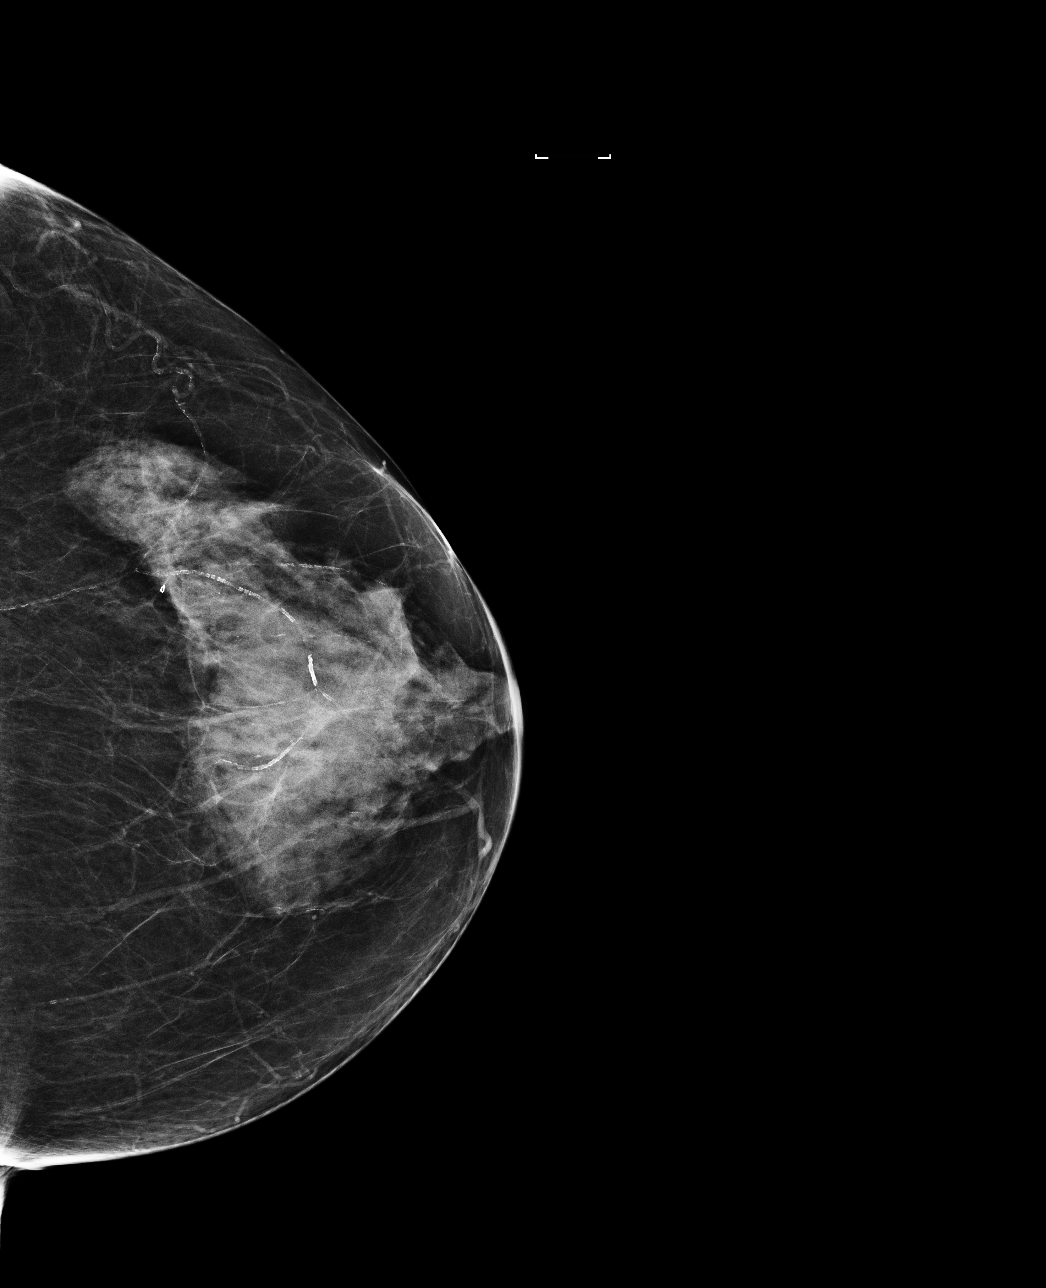

[L MLO]
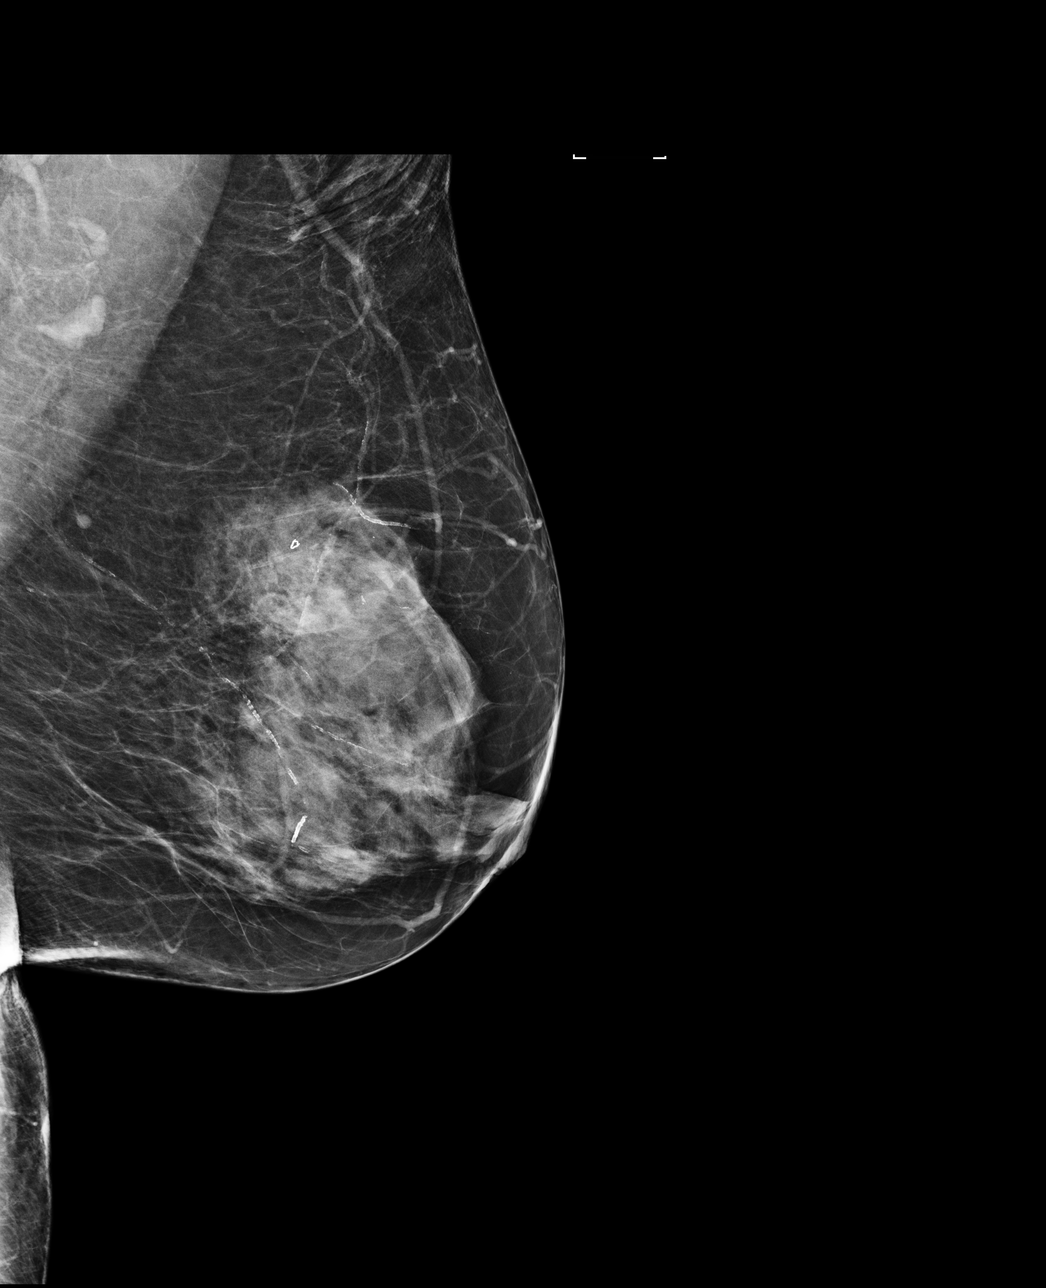

[R CC]
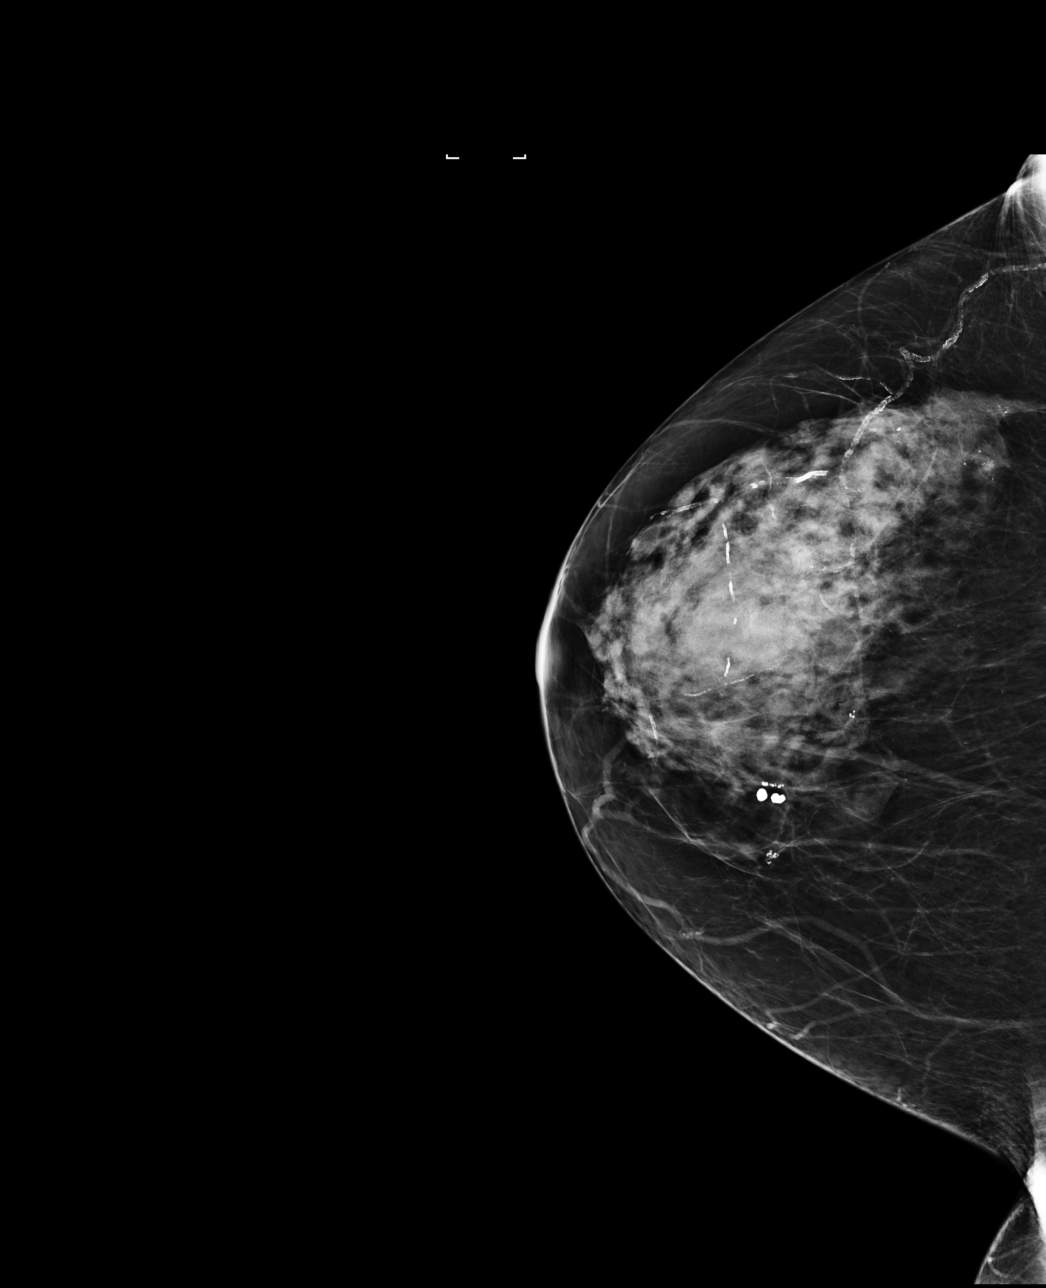

[R MLO]
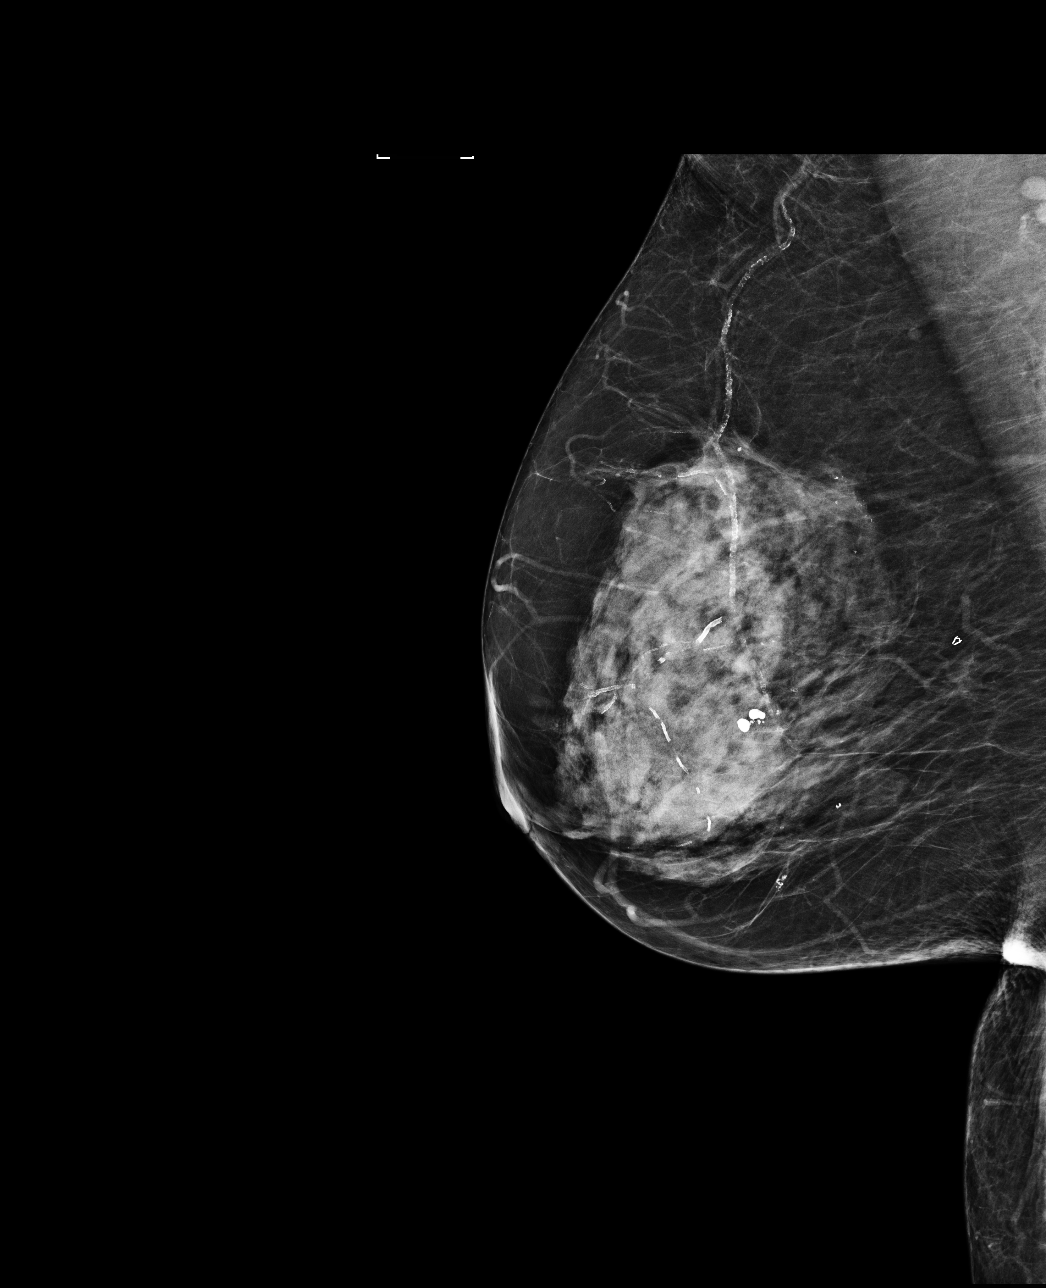

[4 of 4 positions shown; findings below may reference images not displayed]

ACR Breast Density Category c: The breast tissue is heterogeneously
dense, which may obscure small masses.
FINDINGS: In the right breast, calcifications warrant further evaluation with
magnified views. In the left breast, no findings suspicious for
malignancy. Images were processed with CAD.
IMPRESSION: Further evaluation is suggested for calcifications in the right
breast.

RECOMMENDATION:
Diagnostic mammogram of the right breast. (Code:[id number])

The patient will be contacted regarding the findings, and additional
imaging will be scheduled.

BI-RADS CATEGORY  0: Incomplete. Need additional imaging evaluation
and/or prior mammograms for comparison.

## 2017-10-11 ENCOUNTER — Other Ambulatory Visit: Payer: Self-pay | Admitting: Internal Medicine

## 2017-10-11 DIAGNOSIS — R921 Mammographic calcification found on diagnostic imaging of breast: Secondary | ICD-10-CM

## 2017-10-16 DIAGNOSIS — S52501D Unspecified fracture of the lower end of right radius, subsequent encounter for closed fracture with routine healing: Secondary | ICD-10-CM | POA: Diagnosis not present

## 2017-10-16 DIAGNOSIS — S42214D Unspecified nondisplaced fracture of surgical neck of right humerus, subsequent encounter for fracture with routine healing: Secondary | ICD-10-CM | POA: Diagnosis not present

## 2017-10-17 ENCOUNTER — Ambulatory Visit
Admission: RE | Admit: 2017-10-17 | Discharge: 2017-10-17 | Disposition: A | Discharge status: Home / Self Care | Payer: Medicare Other | Source: Ambulatory Visit | Attending: Internal Medicine | Admitting: Internal Medicine

## 2017-10-17 DIAGNOSIS — R921 Mammographic calcification found on diagnostic imaging of breast: Secondary | ICD-10-CM

## 2017-10-17 DIAGNOSIS — N6489 Other specified disorders of breast: Secondary | ICD-10-CM | POA: Diagnosis not present

## 2017-10-18 ENCOUNTER — Other Ambulatory Visit: Payer: Self-pay | Admitting: Endocrinology

## 2017-10-30 ENCOUNTER — Encounter: Payer: Self-pay | Admitting: Nurse Practitioner

## 2017-10-30 ENCOUNTER — Ambulatory Visit (INDEPENDENT_AMBULATORY_CARE_PROVIDER_SITE_OTHER): Payer: Medicare Other | Admitting: Nurse Practitioner

## 2017-10-30 ENCOUNTER — Encounter

## 2017-10-30 VITALS — BP 140/80 | HR 68 | Ht 64.0 in | Wt 174.1 lb

## 2017-10-30 DIAGNOSIS — I259 Chronic ischemic heart disease, unspecified: Secondary | ICD-10-CM | POA: Diagnosis not present

## 2017-10-30 DIAGNOSIS — Z952 Presence of prosthetic heart valve: Secondary | ICD-10-CM | POA: Diagnosis not present

## 2017-10-30 DIAGNOSIS — I10 Essential (primary) hypertension: Secondary | ICD-10-CM

## 2017-10-30 DIAGNOSIS — Z951 Presence of aortocoronary bypass graft: Secondary | ICD-10-CM | POA: Diagnosis not present

## 2017-10-30 NOTE — Progress Notes (Signed)
CARDIOLOGY OFFICE NOTE  Date:  10/30/2017    [name] Date of Birth: May 25, 1932 Medical Record #[id number]  PCP:  Rogers Blocker, MD  Cardiologist:  Servando Snare Nahser    Chief Complaint  Patient presents with  . Follow-up    6 week check - seen for Dr. Acie Fredrickson    History of Present Illness: [name] is a 84 y.o. female who presents today for a 6 week check.  Seen for Dr. Acie Fredrickson. She now primarily follows with me.   She has a history of DM, prior AVR and CABG x 2 per EBG back in 2014 - this was with a LIMA to the LAD and SVG to the LCX, with a pericardial Edwards tissue valve #21 mm. Did have post op atrial fib - was on amiodarone. Other issues include HLD, HTN, DM, depression and hypothyroidism. Myoview ok from 06/2013. Last echo 12/2014.   I saw her back in September of 2016 for a work in visit - she was short of breath. Labs looked ok. Blood count actually improved. Echo updated - did have elevated filling pressures but otherwise unchanged. Prosthetic AV ok. Normal EF and had grade 1 diastolic dysfunction. I wanted to put her on low dose Lasix but she did not wish to proceed due to having frequent urination when on Invokana.   Seen in November of 2016 by me and she was doing ok- not really clear as to why she had improved but she had. She had talked with Dr. Dwyane Dee and he also encouraged prn diuretic. She was agreeable to trying. Seen several times back since- BP still low and Plendil had been stopped altogether. Shehas tried to work [employer].Does have some chronic fatigue issues.She has been quite depressed over the past several times I have seen her - to the point that she is more inactive - I actually got her on low dose SSRI at her last visit with me.   Comes in today. Here with her sister - they live together. She is doing much better. She is back driving as well. No chest pain. Breathing is ok as long as she does not overdo. Sister notes that  she is doing better as well since the low dose Lexapro was added.   Past Medical History:  Diagnosis Date  . Aortic valve disorders    S/P AVR 06/2012  . Arthritis   . Asthma    AS CHILD   . Depression   . Diabetes mellitus   . Heart murmur   . Hyperlipidemia   . Hypertension   . Hypothyroidism   . Ischemic heart disease    with prior CABG x 2 in 2014 along with AVR  . Lumbago   . Shortness of breath   . Thyroid disease     Past Surgical History:  Procedure Laterality Date  . AORTIC VALVE REPLACEMENT N/A 06/25/2012   Procedure: AORTIC VALVE REPLACEMENT (AVR);  Surgeon: Grace Isaac, MD;  Location: [place];  Service: Open Heart Surgery;  Laterality: N/A;  . BREAST BIOPSY Right 08/2016   benign  . BREAST SURGERY     LT BREAST BX BENIGN  . CORONARY ARTERY BYPASS GRAFT N/A 06/25/2012   Procedure: Coronary artery bypass graft  times two using left internal mammary artery and right leg greater saphenous vein;  Surgeon: Grace Isaac, MD;  Location: [place];  Service: Open Heart Surgery;  Laterality: N/A;  . INTRAOPERATIVE TRANSESOPHAGEAL ECHOCARDIOGRAM N/A  06/25/2012   Procedure: INTRAOPERATIVE TRANSESOPHAGEAL ECHOCARDIOGRAM;  Surgeon: Grace Isaac, MD;  Location: [place];  Service: Open Heart Surgery;  Laterality: N/A;  . TUBAL LIGATION       Medications: Current Meds  Medication Sig  . aspirin EC 81 MG tablet Take 1 tablet (81 mg total) by mouth daily.  . B-D UF III MINI PEN NEEDLES 31G X 5 MM MISC USE 3 PER DAY  . benazepril (LOTENSIN) 20 MG tablet Take 20 mg by mouth daily.   . calcium carbonate (TUMS) 500 MG chewable tablet Chew 1 tablet by mouth daily as needed for heartburn (indigestion).  . Canagliflozin-metFORMIN HCl (INVOKAMET) 50-1000 MG TABS Take 1 tablet by mouth 2 (two) times daily.  . carvedilol (COREG) 12.5 MG tablet Take 1 tablet (12.5 mg total) by mouth 2 (two) times daily with a meal.  . cholecalciferol (VITAMIN D) 1000 UNITS tablet Take 1,000 Units by  mouth daily.  . Coenzyme Q10 (CO Q-10) 100 MG CAPS Take 1 capsule by mouth 2 (two) times daily.  Marland Kitchen escitalopram (LEXAPRO) 10 MG tablet Take 10 mg by mouth daily.  . ferrous sulfate 325 (65 FE) MG tablet Take 325 mg by mouth daily with breakfast.  . furosemide (LASIX) 20 MG tablet TAKE 1 TABLET (20 MG TOTAL) BY MOUTH DAILY AS NEEDED FOR FLUID OR EDEMA (OR WEIGHT GAIN).  Marland Kitchen glucose blood (ONETOUCH VERIO) test strip USE AS DIRECTED THREE TIMES DAILY.  Marland Kitchen Insulin Glargine (TOUJEO SOLOSTAR) 300 UNIT/ML SOPN Inject 24 Units into the skin daily.  . insulin lispro (HUMALOG) 100 UNIT/ML KiwkPen Inject 0.06-0.08 mLs (6-8 Units total) into the skin 2 (two) times daily. (Patient taking differently: Inject 3-8 Units into the skin See admin instructions. Takes 6 units at breakfast, takes 6-8 units at lunch and takes 3 units at dinner)  . levothyroxine (SYNTHROID, LEVOTHROID) 75 MCG tablet TAKE 1 TABLET BY MOUTH EVERY DAY  . metFORMIN (GLUCOPHAGE-XR) 750 MG 24 hr tablet TAKE 2 TABLETS BY MOUTH DAILY  . Multiple Vitamins-Minerals (MULTIVITAMINS THER. W/MINERALS) TABS Take 1 tablet by mouth daily.  . rosuvastatin (CRESTOR) 20 MG tablet TAKE 1 TABLET(20 MG) BY MOUTH DAILY    Allergies: No Known Allergies  Social History: The patient  reports that she has never smoked. She has never used smokeless tobacco. She reports that she does not drink alcohol or use drugs.   Family History: The patient's family history includes Heart disease in her father.   Review of Systems: Please see the history of present illness.   Otherwise, the review of systems is positive for none.   All other systems are reviewed and negative.   Physical Exam: VS:  BP 140/80 (BP Location: Left Arm, Patient Position: Sitting, Cuff Size: Large)   Pulse 68   Ht 5\' 4"  (1.626 m)   Wt 174 lb 1.9 oz (79 kg)   SpO2 98% Comment: at rest  BMI 29.89 kg/m  .  BMI Body mass index is 29.89 kg/m.  Wt Readings from Last 3 Encounters:  10/30/17 174  lb 1.9 oz (79 kg)  09/17/17 174 lb (78.9 kg)  08/29/17 174 lb (78.9 kg)    General: Pleasant. Well developed, well nourished and in no acute distress.  Affect much brighter today.  HEENT: Normal.  Neck: Supple, no JVD, carotid bruits, or masses noted.  Cardiac: Regular rate and rhythm. Soft outflow murmur. No edema.  Respiratory:  Lungs are clear to auscultation bilaterally with normal work of breathing.  GI:  Soft and nontender.  MS: No deformity or atrophy. Gait and ROM intact.  Skin: Warm and dry. Color is normal.  Neuro:  Strength and sensation are intact and no gross focal deficits noted.  Psych: Alert, appropriate and with normal affect.   LABORATORY DATA:  EKG:  EKG is not ordered today.  Lab Results  Component Value Date   WBC 4.3 08/29/2017   HGB 10.7 (L) 08/29/2017   HCT 32.7 (L) 08/29/2017   PLT 215 08/29/2017   GLUCOSE 77 08/29/2017   CHOL 143 08/29/2017   TRIG 57 08/29/2017   HDL 56 08/29/2017   LDLDIRECT 102.8 11/28/2013   LDLCALC 76 08/29/2017   ALT 12 08/29/2017   AST 20 08/29/2017   NA 140 08/29/2017   K 4.0 08/29/2017   CL 100 08/29/2017   CREATININE 1.05 (H) 08/29/2017   BUN 15 08/29/2017   CO2 26 08/29/2017   TSH 2.330 08/29/2017   INR 1.57 (H) 06/25/2012   HGBA1C 8.1 (H) 09/11/2017   MICROALBUR 37.8 (H) 05/24/2017     BNP (last 3 results) No results for input(s): BNP in the last 8760 hours.  ProBNP (last 3 results) No results for input(s): PROBNP in the last 8760 hours.   Other Studies Reviewed Today:  EchoStudy Conclusions8/2018  - Left ventricle: The cavity size was normal. Systolic function was normal. The estimated ejection fraction was in the range of 55% to 60%. Wall motion was normal; there were no regional wall motion abnormalities. There was an increased relative contribution of atrial contraction to ventricular filling. Doppler parameters are consistent with abnormal left ventricular relaxation (grade 1  diastolic dysfunction). - Aortic valve: A pericardial bioprosthesis was present and functioning normally. Mean gradient (S): 9 mm Hg. Peak gradient (S): 17 mm Hg. - Mitral valve: Calcified annulus. Mildly thickened leaflets . There was mild to moderate regurgitation. - Left atrium: The atrium was mildly dilated. - Atrial septum: There was increased thickness of the septum, consistent with lipomatous hypertrophy. - Pulmonary arteries: PA peak pressure: 42 mm Hg (S).  Impressions:  - The right ventricular systolic pressure was increased consistent with moderate pulmonary hypertension.   Myoview Impression from 06/2013 Exercise Capacity: Lexiscan with no exercise. BP Response: Normal blood pressure response. Clinical Symptoms: No significant symptoms noted. ECG Impression: There are scattered PVCs. Comparison with Prior Nuclear Study: No previous nuclear study performed  Overall Impression: Low risk stress nuclear study small fixed distal anterior artifact which could be breast attenuation or other artifact.  LV Ejection Fraction: 66%. LV Wall Motion: Normal Wall Motion  [name], MD, [facility]    Assessment/Plan:  1. Depression - I started her on Lexapro - she is doing well and I think has improved - will continue for now. She seems more active as well.   2. CAD - s/p CABG 06-27-12 -last Myoview stable from 2015. She has no active chest pain.   3. Aortic stenosis with priortissue valveAVR - she had her last echo back in August of 2018 - this was stable.   4. HTN -BP is fair - no changes made today.    5. Obesity- weight is unchanged - she is more active with the low dose SSRI added.   Current medicines are reviewed with the patient today.  The patient does not have concerns regarding medicines other than what has been noted above.  The following changes have been made:  See above.  Labs/ tests ordered today include:   No orders of  the defined types were placed in this encounter.    Disposition:   FU with me in 4 months.   Patient is agreeable to this plan and will call if any problems develop in the interim.   SignedTruitt Merle, NP  10/30/2017 9:20 AM  [facility] [address] [place], [place]  [address] Phone: [phone] Fax: [phone]

## 2017-10-30 NOTE — Patient Instructions (Addendum)
We will be checking the following labs today - NONE   Medication Instructions:    Continue with your current medicines.     Testing/Procedures To Be Arranged:  N/A  Follow-Up:   See me in 3 to 4 months.     Other Special Instructions:   N/A    If you need a refill on your cardiac medications before your next appointment, please call your pharmacy.   Call the [facility] office at [phone] if you have any questions, problems or concerns.      

## 2017-11-05 DIAGNOSIS — I10 Essential (primary) hypertension: Secondary | ICD-10-CM | POA: Diagnosis not present

## 2017-11-05 DIAGNOSIS — I350 Nonrheumatic aortic (valve) stenosis: Secondary | ICD-10-CM | POA: Diagnosis not present

## 2017-11-05 DIAGNOSIS — E119 Type 2 diabetes mellitus without complications: Secondary | ICD-10-CM | POA: Diagnosis not present

## 2017-11-05 DIAGNOSIS — F329 Major depressive disorder, single episode, unspecified: Secondary | ICD-10-CM | POA: Diagnosis not present

## 2017-11-12 DIAGNOSIS — H35363 Drusen (degenerative) of macula, bilateral: Secondary | ICD-10-CM | POA: Diagnosis not present

## 2017-11-12 DIAGNOSIS — H35033 Hypertensive retinopathy, bilateral: Secondary | ICD-10-CM | POA: Diagnosis not present

## 2017-11-12 DIAGNOSIS — E113313 Type 2 diabetes mellitus with moderate nonproliferative diabetic retinopathy with macular edema, bilateral: Secondary | ICD-10-CM | POA: Diagnosis not present

## 2017-11-12 DIAGNOSIS — H3540 Unspecified peripheral retinal degeneration: Secondary | ICD-10-CM | POA: Diagnosis not present

## 2017-11-14 ENCOUNTER — Ambulatory Visit (INDEPENDENT_AMBULATORY_CARE_PROVIDER_SITE_OTHER): Payer: Medicare Other | Admitting: Podiatry

## 2017-11-14 ENCOUNTER — Encounter: Payer: Self-pay | Admitting: Podiatry

## 2017-11-14 DIAGNOSIS — M79676 Pain in unspecified toe(s): Secondary | ICD-10-CM | POA: Diagnosis not present

## 2017-11-14 DIAGNOSIS — B351 Tinea unguium: Secondary | ICD-10-CM | POA: Diagnosis not present

## 2017-11-14 DIAGNOSIS — L84 Corns and callosities: Secondary | ICD-10-CM

## 2017-11-14 DIAGNOSIS — E1151 Type 2 diabetes mellitus with diabetic peripheral angiopathy without gangrene: Secondary | ICD-10-CM | POA: Diagnosis not present

## 2017-11-14 DIAGNOSIS — M2041 Other hammer toe(s) (acquired), right foot: Secondary | ICD-10-CM

## 2017-11-14 NOTE — Progress Notes (Addendum)
Complaint:  Visit Type: Patient returns to my office for continued preventative foot care services. Complaint: Patient states" my nails have grown long and thick and become painful to walk and wear shoes" Patient has been diagnosed with DM with no foot complications. The patient presents for preventative foot care services. No changes to ROS.  Patient has painful hammer toe second right foot.  Podiatric Exam: Vascular: dorsalis pedis are palpable bilateral.  Posterior tibial pulses are not palpable.Capillary return is immediate. Temperature gradient is WNL. Skin turgor WNL  Sensorium: Diminished  Semmes Weinstein monofilament test. Normal tactile sensation bilaterally. Nail Exam: Pt has thick disfigured discolored nails with subungual debris noted bilateral entire nail hallux through fifth toenails Ulcer Exam: There is no evidence of ulcer or pre-ulcerative changes or infection. Orthopedic Exam: Muscle tone and strength are WNL. No limitations in general ROM. No crepitus or effusions noted. Foot type and digits show no abnormalities. HAV  B/L.  Hammer toe second right. Skin: No Porokeratosis asymptomatic.Marland Kitchen No infection or ulcers.  HD second toe right foot.  Diagnosis:  Onychomycosis, , Pain in right toe, pain in left toes  Treatment & Plan Procedures and Treatment: Consent by patient was obtained for treatment procedures.   Debridement of mycotic and hypertrophic toenails, 1 through 5 bilateral and clearing of subungual debris. No ulceration, no infection noted. Debride heloma durum second toe right foot.  Told to make an appointment with Dr.  Cannon Kettle for surgical consult. ABN signed for 2019. Return Visit-Office Procedure: Patient instructed to return to the office for a follow up visit 3 months for continued evaluation and treatment.    Gardiner Barefoot DPM

## 2017-11-29 DIAGNOSIS — E113311 Type 2 diabetes mellitus with moderate nonproliferative diabetic retinopathy with macular edema, right eye: Secondary | ICD-10-CM | POA: Diagnosis not present

## 2017-12-03 DIAGNOSIS — S42214D Unspecified nondisplaced fracture of surgical neck of right humerus, subsequent encounter for fracture with routine healing: Secondary | ICD-10-CM | POA: Diagnosis not present

## 2017-12-03 DIAGNOSIS — S52501D Unspecified fracture of the lower end of right radius, subsequent encounter for closed fracture with routine healing: Secondary | ICD-10-CM | POA: Diagnosis not present

## 2017-12-12 DIAGNOSIS — S52501D Unspecified fracture of the lower end of right radius, subsequent encounter for closed fracture with routine healing: Secondary | ICD-10-CM | POA: Diagnosis not present

## 2017-12-12 DIAGNOSIS — S42214D Unspecified nondisplaced fracture of surgical neck of right humerus, subsequent encounter for fracture with routine healing: Secondary | ICD-10-CM | POA: Diagnosis not present

## 2017-12-14 DIAGNOSIS — S42214D Unspecified nondisplaced fracture of surgical neck of right humerus, subsequent encounter for fracture with routine healing: Secondary | ICD-10-CM | POA: Diagnosis not present

## 2017-12-14 DIAGNOSIS — S52501D Unspecified fracture of the lower end of right radius, subsequent encounter for closed fracture with routine healing: Secondary | ICD-10-CM | POA: Diagnosis not present

## 2017-12-17 ENCOUNTER — Other Ambulatory Visit (INDEPENDENT_AMBULATORY_CARE_PROVIDER_SITE_OTHER): Payer: Medicare Other

## 2017-12-17 DIAGNOSIS — Z794 Long term (current) use of insulin: Secondary | ICD-10-CM | POA: Diagnosis not present

## 2017-12-17 DIAGNOSIS — E1165 Type 2 diabetes mellitus with hyperglycemia: Secondary | ICD-10-CM

## 2017-12-17 LAB — BASIC METABOLIC PANEL
BUN: 22 mg/dL (ref 6–23)
CHLORIDE: 102 meq/L (ref 96–112)
CO2: 28 mEq/L (ref 19–32)
Calcium: 10.2 mg/dL (ref 8.4–10.5)
Creatinine, Ser: 1.14 mg/dL (ref 0.40–1.20)
GFR: 58.58 mL/min — AB (ref >60.00)
GLUCOSE: 100 mg/dL — AB (ref 70–99)
POTASSIUM: 4.1 meq/L (ref 3.5–5.1)
SODIUM: 138 meq/L (ref 135–145)

## 2017-12-17 LAB — HEMOGLOBIN A1C: HEMOGLOBIN A1C: 7.7 % — AB (ref 4.6–6.5)

## 2017-12-18 ENCOUNTER — Other Ambulatory Visit: Payer: Medicare Other

## 2017-12-18 DIAGNOSIS — S42214D Unspecified nondisplaced fracture of surgical neck of right humerus, subsequent encounter for fracture with routine healing: Secondary | ICD-10-CM | POA: Diagnosis not present

## 2017-12-18 DIAGNOSIS — S52501D Unspecified fracture of the lower end of right radius, subsequent encounter for closed fracture with routine healing: Secondary | ICD-10-CM | POA: Diagnosis not present

## 2017-12-20 ENCOUNTER — Encounter: Payer: Self-pay | Admitting: Endocrinology

## 2017-12-20 ENCOUNTER — Ambulatory Visit (INDEPENDENT_AMBULATORY_CARE_PROVIDER_SITE_OTHER): Payer: Medicare Other | Admitting: Endocrinology

## 2017-12-20 VITALS — BP 138/80 | HR 76 | Ht 63.0 in | Wt 172.0 lb

## 2017-12-20 DIAGNOSIS — I259 Chronic ischemic heart disease, unspecified: Secondary | ICD-10-CM | POA: Diagnosis not present

## 2017-12-20 DIAGNOSIS — Z794 Long term (current) use of insulin: Secondary | ICD-10-CM | POA: Diagnosis not present

## 2017-12-20 DIAGNOSIS — E063 Autoimmune thyroiditis: Secondary | ICD-10-CM

## 2017-12-20 DIAGNOSIS — E1165 Type 2 diabetes mellitus with hyperglycemia: Secondary | ICD-10-CM

## 2017-12-20 DIAGNOSIS — E113313 Type 2 diabetes mellitus with moderate nonproliferative diabetic retinopathy with macular edema, bilateral: Secondary | ICD-10-CM | POA: Diagnosis not present

## 2017-12-20 NOTE — Patient Instructions (Signed)
Add protein in am  6-8 Humalog at lunch based on size of meal, must take BEFORE meals

## 2017-12-20 NOTE — Progress Notes (Signed)
[name] 84 y.o.            Reason for Appointment: Diabetes follow-up   History of Present Illness   Diagnosis: Type 2 DIABETES MELITUS, date of diagnosis:   2000   She has been on insulin since 2004 with difficulty controlling adequately because of compliance with diet and variability in blood sugars. Her previous records are not available at present, however she has been on basal insulin with mealtime coverage since at least 2007 She has been tried on Byetta previously but did not tolerate this well She tried Victoza but didn't not benefit from Victoza 0.6 dosage; had nausea from 1.2 mg; did not continue this because of this side effect She also was given a trial of the V-go pump but she subjectively did not like this  RECENT history:    INSULIN: Toujeo 22 units in morning, HUMALOG 6  acb, 6 lunch and 4 at supper    Oral hypoglycemic drugs: Metformin er 750 mg at supper, Invokamet 50/1000, half tablet   A1c is usually around 8%, slightly better now at 7.7   She has the following blood sugar patterns and problems identified  Has checked her blood sugar mostly in the morning and only sporadically later in the day  She now says that she is frequently not taking her Humalog before eating at lunchtime and will take it an hour later despite reminders in the past  With this she may have blood sugars occasionally in the upper 200 and 300 range  Also not checking blood sugars usually before lunch and dinner and mostly in the evenings after 5 or 6 PM  Her main meal is at lunchtime but she does not increase her dose based on what she is eating  If her evening meal is very light are low in carbohydrates she may not take them a lot at suppertime  However she still has occasional low blood sugars, once at 4 PM and once around 6 PM either related to taking her Humalog late or too much in the evening  Has been trying to do a little walking outside  Her weight is down 2  pounds  On her previous visit she was told to take her Invokamet regularly and reassured her about the benefit and risk profile      .         Monitors blood glucose:  about 3 times daily     Glucometer: One Touch Verio.          Blood Glucose readings from download:   PRE-MEAL Fasting Lunch Dinner Bedtime Overall  Glucose range:  95-165   40-159    Mean/median: 120   187  120   POST-MEAL PC Breakfast PC Lunch PC Dinner  Glucose range:   179  48-304  Mean/median:    154     Meals: 3 meals per day:  breakfast is At 7 am;  lunch 12-2 pm, dinner 5 pm Intake is quite variable at different meals, usually lighter supper    Wt Readings from Last 3 Encounters:  12/20/17 172 lb (78 kg)  10/30/17 174 lb 1.9 oz (79 kg)  09/17/17 174 lb (78.9 kg)    Lab Results  Component Value Date   HGBA1C 7.7 (H) 12/17/2017   HGBA1C 8.1 (H) 09/11/2017   HGBA1C 8.3 (H) 05/24/2017   Lab Results  Component Value Date   MICROALBUR 37.8 (H) 05/24/2017   LDLCALC 76 08/29/2017   CREATININE 1.14  12/17/2017    Lab on 12/17/2017  Component Date Value Ref Range Status  . Sodium 12/17/2017 138  135 - 145 mEq/L Final  . Potassium 12/17/2017 4.1  3.5 - 5.1 mEq/L Final  . Chloride 12/17/2017 102  96 - 112 mEq/L Final  . CO2 12/17/2017 28  19 - 32 mEq/L Final  . Glucose, Bld 12/17/2017 100* 70 - 99 mg/dL Final  . BUN 12/17/2017 22  6 - 23 mg/dL Final  . Creatinine, Ser 12/17/2017 1.14  0.40 - 1.20 mg/dL Final  . Calcium 12/17/2017 10.2  8.4 - 10.5 mg/dL Final  . GFR 12/17/2017 58.58* >60.00 mL/min Final  . Hgb A1c MFr Bld 12/17/2017 7.7* 4.6 - 6.5 % Final   Glycemic Control Guidelines for People with Diabetes:Non Diabetic:  <6%Goal of Therapy: <7%Additional Action Suggested:  >8%      Allergies as of 12/20/2017   No Known Allergies     Medication List        Accurate as of 12/20/17  4:51 PM. Always use your most recent med list.          aspirin EC 81 MG tablet Take 1 tablet (81 mg total)  by mouth daily.   B-D UF III MINI PEN NEEDLES 31G X 5 MM Misc Generic drug:  Insulin Pen Needle USE 3 PER DAY   benazepril 20 MG tablet Commonly known as:  LOTENSIN Take 20 mg by mouth daily.   Canagliflozin-metFORMIN HCl 50-1000 MG Tabs Take 1 tablet by mouth 2 (two) times daily.   carvedilol 12.5 MG tablet Commonly known as:  COREG Take 1 tablet (12.5 mg total) by mouth 2 (two) times daily with a meal.   cholecalciferol 1000 units tablet Commonly known as:  VITAMIN D Take 1,000 Units by mouth daily.   Co Q-10 100 MG Caps Take 1 capsule by mouth 2 (two) times daily.   escitalopram 10 MG tablet Commonly known as:  LEXAPRO Take 10 mg by mouth daily.   ferrous sulfate 325 (65 FE) MG tablet Take 325 mg by mouth daily with breakfast.   furosemide 20 MG tablet Commonly known as:  LASIX TAKE 1 TABLET (20 MG TOTAL) BY MOUTH DAILY AS NEEDED FOR FLUID OR EDEMA (OR WEIGHT GAIN).   glucose blood test strip USE AS DIRECTED THREE TIMES DAILY.   Insulin Glargine 300 UNIT/ML Sopn Inject 24 Units into the skin daily.   insulin lispro 100 UNIT/ML KiwkPen Commonly known as:  HUMALOG Inject 0.06-0.08 mLs (6-8 Units total) into the skin 2 (two) times daily.   levothyroxine 75 MCG tablet Commonly known as:  SYNTHROID, LEVOTHROID TAKE 1 TABLET BY MOUTH EVERY DAY   metFORMIN 750 MG 24 hr tablet Commonly known as:  GLUCOPHAGE-XR TAKE 2 TABLETS BY MOUTH DAILY   multivitamins ther. w/minerals Tabs tablet Take 1 tablet by mouth daily.   rosuvastatin 20 MG tablet Commonly known as:  CRESTOR TAKE 1 TABLET(20 MG) BY MOUTH DAILY   TUMS 500 MG chewable tablet Generic drug:  calcium carbonate Chew 1 tablet by mouth daily as needed for heartburn (indigestion).       Allergies: No Known Allergies  Past Medical History:  Diagnosis Date  . Aortic valve disorders    S/P AVR 06/2012  . Arthritis   . Asthma    AS CHILD   . Depression   . Diabetes mellitus   . Heart murmur   .  Hyperlipidemia   . Hypertension   . Hypothyroidism   .  Ischemic heart disease    with prior CABG x 2 in 2014 along with AVR  . Lumbago   . Shortness of breath   . Thyroid disease     Past Surgical History:  Procedure Laterality Date  . AORTIC VALVE REPLACEMENT N/A 06/25/2012   Procedure: AORTIC VALVE REPLACEMENT (AVR);  Surgeon: Grace Isaac, MD;  Location: [place];  Service: Open Heart Surgery;  Laterality: N/A;  . BREAST BIOPSY Right 08/2016   benign  . BREAST SURGERY     LT BREAST BX BENIGN  . CORONARY ARTERY BYPASS GRAFT N/A 06/25/2012   Procedure: Coronary artery bypass graft  times two using left internal mammary artery and right leg greater saphenous vein;  Surgeon: Grace Isaac, MD;  Location: [place];  Service: Open Heart Surgery;  Laterality: N/A;  . INTRAOPERATIVE TRANSESOPHAGEAL ECHOCARDIOGRAM N/A 06/25/2012   Procedure: INTRAOPERATIVE TRANSESOPHAGEAL ECHOCARDIOGRAM;  Surgeon: Grace Isaac, MD;  Location: [place];  Service: Open Heart Surgery;  Laterality: N/A;  . TUBAL LIGATION      Family History  Problem Relation Age of Onset  . Heart disease Father     Social History:  reports that she has never smoked. She has never used smokeless tobacco. She reports that she does not drink alcohol or use drugs.  Review of Systems:   HYPERTENSION: Blood pressure is usually followed by PCP Recent blood pressure readings as follows  BP Readings from Last 3 Encounters:  12/20/17 138/80  10/30/17 140/80  09/17/17 140/82     HYPOTHYROIDISM: She has had long-standing hypothyroidism  She is compliant with her levothyroxine 75 g dose  TSH has been consistent   Lab Results  Component Value Date   TSH 2.330 08/29/2017   TSH 0.92 01/22/2017   TSH 1.45 07/17/2016   FREET4 1.01 09/11/2017   FREET4 1.13 06/02/2015   FREET4 1.07 03/11/2014    HYPERLIPIDEMIA: The lipid abnormality consists of elevated LDL; has been  controlled with Crestor 20 mg LDL as below       Lab Results  Component Value Date   CHOL 143 08/29/2017   HDL 56 08/29/2017   LDLCALC 76 08/29/2017   LDLDIRECT 102.8 11/28/2013   TRIG 57 08/29/2017   CHOLHDL 2.6 08/29/2017  '    She does see a podiatrist regularly 1/19, no new findings  Last foot exam Here was in 12/16: Diabetic foot exam shows normal monofilament sensation in the toes and plantar surfaces, no skin lesions or ulcers on the feet and normal pedal pulses    Examination:   BP 138/80 (BP Location: Right Arm, Patient Position: Sitting, Cuff Size: Normal)   Pulse 76   Ht 5\' 3"  (1.6 m)   Wt 172 lb (78 kg)   BMI 30.47 kg/m   Body mass index is 30.47 kg/m.    ASSESSMENT/ PLAN:   Diabetes type 2, uncontrolled   See history of present illness for a discussion of the blood sugar patterns, recent management and problems identified  A1c is 7.7, previously 8.1  Again has been having problems with compliance with her mealtime insulin and taking enough insulin or not taking insulin on time for her meals especially lunch Not clear what her blood sugars are after breakfast Also has only occasional readings after lunch which is her main meal She also does tend to do better with the taking Invokamet but does not take the full dose Overall still has low motivation to change her habits  Emphasized the need  to check her blood sugars more often after meals She does need to take her mealtime insulin with her when she is eating out at lunchtime Also avoiding late dosing of insulin will reduce tendency to hypoglycemia before suppertime She will need to reduce her coverage at suppertime if she is eating less carbohydrate  Discussed blood sugar targets after meals Also emphasized that she cannot just eat cereal and banana at breakfast and needs to add a protein  Follow-up in 3 months with A1c again  HYPERTENSION: Well-controlled  Hypothyroidism: TSH to be rechecked on the next visit  Counseling time on subjects  discussed in assessment and plan sections is over 50% of today's 25 minute visit   Patient Instructions  Add protein in am  6-8 Humalog at lunch based on size of meal, must take BEFORE meals       Elayne Snare 12/20/2017, 4:51 PM   Note: This office note was prepared with Dragon voice recognition system technology. Any transcriptional errors that result from this process are unintentional.

## 2017-12-24 ENCOUNTER — Other Ambulatory Visit: Payer: Self-pay | Admitting: Endocrinology

## 2017-12-24 ENCOUNTER — Telehealth: Payer: Self-pay | Admitting: Endocrinology

## 2017-12-24 DIAGNOSIS — R413 Other amnesia: Secondary | ICD-10-CM | POA: Diagnosis not present

## 2017-12-24 DIAGNOSIS — S42214D Unspecified nondisplaced fracture of surgical neck of right humerus, subsequent encounter for fracture with routine healing: Secondary | ICD-10-CM | POA: Diagnosis not present

## 2017-12-24 DIAGNOSIS — I10 Essential (primary) hypertension: Secondary | ICD-10-CM | POA: Diagnosis not present

## 2017-12-24 DIAGNOSIS — S52501D Unspecified fracture of the lower end of right radius, subsequent encounter for closed fracture with routine healing: Secondary | ICD-10-CM | POA: Diagnosis not present

## 2017-12-24 DIAGNOSIS — E119 Type 2 diabetes mellitus without complications: Secondary | ICD-10-CM | POA: Diagnosis not present

## 2017-12-24 DIAGNOSIS — E039 Hypothyroidism, unspecified: Secondary | ICD-10-CM | POA: Diagnosis not present

## 2017-12-24 DIAGNOSIS — Z23 Encounter for immunization: Secondary | ICD-10-CM | POA: Diagnosis not present

## 2017-12-24 NOTE — Telephone Encounter (Signed)
LMTCB

## 2017-12-24 NOTE — Telephone Encounter (Signed)
Spoke with the patient and sent in her humalog prescription that she needed- this has been resolved

## 2017-12-24 NOTE — Telephone Encounter (Signed)
Patient stated she is needing a nurse to call her. She said it is very important and about her insulin  Please advise

## 2017-12-27 DIAGNOSIS — S42214D Unspecified nondisplaced fracture of surgical neck of right humerus, subsequent encounter for fracture with routine healing: Secondary | ICD-10-CM | POA: Diagnosis not present

## 2017-12-27 DIAGNOSIS — S52501D Unspecified fracture of the lower end of right radius, subsequent encounter for closed fracture with routine healing: Secondary | ICD-10-CM | POA: Diagnosis not present

## 2017-12-29 ENCOUNTER — Other Ambulatory Visit: Payer: Self-pay | Admitting: Endocrinology

## 2018-01-03 DIAGNOSIS — S42214D Unspecified nondisplaced fracture of surgical neck of right humerus, subsequent encounter for fracture with routine healing: Secondary | ICD-10-CM | POA: Diagnosis not present

## 2018-01-03 DIAGNOSIS — S52501D Unspecified fracture of the lower end of right radius, subsequent encounter for closed fracture with routine healing: Secondary | ICD-10-CM | POA: Diagnosis not present

## 2018-01-10 DIAGNOSIS — S52571A Other intraarticular fracture of lower end of right radius, initial encounter for closed fracture: Secondary | ICD-10-CM | POA: Diagnosis not present

## 2018-01-10 DIAGNOSIS — M25561 Pain in right knee: Secondary | ICD-10-CM | POA: Diagnosis not present

## 2018-01-10 DIAGNOSIS — S42291A Other displaced fracture of upper end of right humerus, initial encounter for closed fracture: Secondary | ICD-10-CM | POA: Diagnosis not present

## 2018-02-05 ENCOUNTER — Ambulatory Visit (INDEPENDENT_AMBULATORY_CARE_PROVIDER_SITE_OTHER): Payer: Medicare Other | Admitting: Nurse Practitioner

## 2018-02-05 ENCOUNTER — Encounter: Payer: Self-pay | Admitting: Nurse Practitioner

## 2018-02-05 ENCOUNTER — Encounter (INDEPENDENT_AMBULATORY_CARE_PROVIDER_SITE_OTHER): Payer: Self-pay

## 2018-02-05 VITALS — BP 118/66 | HR 73 | Ht 63.0 in | Wt 172.0 lb

## 2018-02-05 DIAGNOSIS — E7849 Other hyperlipidemia: Secondary | ICD-10-CM

## 2018-02-05 DIAGNOSIS — F329 Major depressive disorder, single episode, unspecified: Secondary | ICD-10-CM

## 2018-02-05 DIAGNOSIS — F32A Depression, unspecified: Secondary | ICD-10-CM

## 2018-02-05 DIAGNOSIS — I259 Chronic ischemic heart disease, unspecified: Secondary | ICD-10-CM | POA: Diagnosis not present

## 2018-02-05 DIAGNOSIS — Z952 Presence of prosthetic heart valve: Secondary | ICD-10-CM | POA: Diagnosis not present

## 2018-02-05 DIAGNOSIS — I10 Essential (primary) hypertension: Secondary | ICD-10-CM | POA: Diagnosis not present

## 2018-02-05 DIAGNOSIS — Z951 Presence of aortocoronary bypass graft: Secondary | ICD-10-CM

## 2018-02-05 NOTE — Progress Notes (Signed)
CARDIOLOGY OFFICE NOTE  Date:  02/05/2018     [name] Date of Birth: [birth year] Medical Record #[id number]  PCP:  Rogers Blocker, MD  Cardiologist:  Servando Snare Nahser    Chief Complaint  Patient presents with  . Coronary Artery Disease    3 month check - seen for Dr. Acie Fredrickson    History of Present Illness: [name] is a 84 y.o. female who presents today for a follow up visit. Seen for Dr. Acie Fredrickson. She now primarily follows with me.   She has a history of DM, prior AVR and CABG x 2 per [facility] back in 2014 - this was with a LIMA to the LAD and SVG to the LCX, with a pericardial Edwards tissue valve #21 mm. Did have post op atrial fib - was on amiodarone. Other issues include HLD, HTN, DM, depression and hypothyroidism. Myoview ok from 06/2013. Last echo 12/2014.   I saw her back in September of 2016 for a work in visit - she was short of breath. Labs looked ok. Blood count actually improved. Echo updated - did have elevated filling pressures but otherwise unchanged. Prosthetic AV ok. Normal EF and had grade 1 diastolic dysfunction. I wanted to put her on low dose Lasix but she did not wish to proceed due to having frequent urination when on Invokana.   Seen in November of 2016 by me and she was doing ok- not really clear as to why she had improved but she had. She had talked with Dr. Dwyane Dee and he also encouraged prn diuretic. She was agreeable to trying. Seen several times back since- BP still low and Plendil had been stopped altogether. Shehas tried to work [employer].Does have some chronic fatigue issues.She has been quite depressed over the past several times I have seen her - to the point that she is more inactive - I actually was able to get  her on low dose SSRI.   Last seen in July - doing better with low dose Lexapro.   Comes in today. Here with her sister - they live together. Doing ok. No chest pain. Breathing is stable. Still some issues with her  right arm from prior fall/fracture. She has declined surgical intervention on her rotator. She is trying to be more active. Bp is ok. No palpitations. She feels like she is doing ok.   Past Medical History:  Diagnosis Date  . Aortic valve disorders    S/P AVR 06/2012  . Arthritis   . Asthma    AS CHILD   . Depression   . Diabetes mellitus   . Heart murmur   . Hyperlipidemia   . Hypertension   . Hypothyroidism   . Ischemic heart disease    with prior CABG x 2 in 2014 along with AVR  . Lumbago   . Shortness of breath   . Thyroid disease     Past Surgical History:  Procedure Laterality Date  . AORTIC VALVE REPLACEMENT N/A 06/25/2012   Procedure: AORTIC VALVE REPLACEMENT (AVR);  Surgeon: Grace Isaac, MD;  Location: [facility];  Service: Open Heart Surgery;  Laterality: N/A;  . BREAST BIOPSY Right 08/2016   benign  . BREAST SURGERY     LT BREAST BX BENIGN  . CORONARY ARTERY BYPASS GRAFT N/A 06/25/2012   Procedure: Coronary artery bypass graft  times two using left internal mammary artery and right leg greater saphenous vein;  Surgeon: Grace Isaac, MD;  Location: [facility];  Service: Open Heart Surgery;  Laterality: N/A;  . INTRAOPERATIVE TRANSESOPHAGEAL ECHOCARDIOGRAM N/A 06/25/2012   Procedure: INTRAOPERATIVE TRANSESOPHAGEAL ECHOCARDIOGRAM;  Surgeon: Grace Isaac, MD;  Location: [facility];  Service: Open Heart Surgery;  Laterality: N/A;  . TUBAL LIGATION       Medications: Current Meds  Medication Sig  . aspirin EC 81 MG tablet Take 1 tablet (81 mg total) by mouth daily.  . B-D UF III MINI PEN NEEDLES 31G X 5 MM MISC USE 3 PER DAY  . benazepril (LOTENSIN) 20 MG tablet Take 20 mg by mouth daily.   . calcium carbonate (TUMS) 500 MG chewable tablet Chew 1 tablet by mouth daily as needed for heartburn (indigestion).  . Canagliflozin-metFORMIN HCl (INVOKAMET) 50-1000 MG TABS Take 1 tablet by mouth 2 (two) times daily.  . carvedilol (COREG) 12.5 MG tablet Take 1 tablet (12.5 mg  total) by mouth 2 (two) times daily with a meal.  . cholecalciferol (VITAMIN D) 1000 UNITS tablet Take 1,000 Units by mouth daily.  . Coenzyme Q10 (CO Q-10) 100 MG CAPS Take 1 capsule by mouth 2 (two) times daily.  Marland Kitchen escitalopram (LEXAPRO) 10 MG tablet Take 10 mg by mouth daily.  . ferrous sulfate 325 (65 FE) MG tablet Take 325 mg by mouth daily with breakfast.  . furosemide (LASIX) 20 MG tablet TAKE 1 TABLET (20 MG TOTAL) BY MOUTH DAILY AS NEEDED FOR FLUID OR EDEMA (OR WEIGHT GAIN).  Marland Kitchen glucose blood (ONETOUCH VERIO) test strip USE AS DIRECTED THREE TIMES DAILY.  Marland Kitchen HUMALOG KWIKPEN 100 UNIT/ML KiwkPen INJECT 6 TO 8 UNITS INTO THE SKIN TWICE DAILY  . Insulin Glargine (TOUJEO SOLOSTAR) 300 UNIT/ML SOPN Inject 24 Units into the skin daily.  Marland Kitchen levothyroxine (SYNTHROID, LEVOTHROID) 75 MCG tablet TAKE 1 TABLET BY MOUTH EVERY DAY  . Multiple Vitamins-Minerals (MULTIVITAMINS THER. W/MINERALS) TABS Take 1 tablet by mouth daily.  . rosuvastatin (CRESTOR) 20 MG tablet TAKE 1 TABLET(20 MG) BY MOUTH DAILY  . [DISCONTINUED] INVOKAMET 50-1000 MG TABS TAKE 1 TABLET BY MOUTH TWICE DAILY     Allergies: No Known Allergies  Social History: The patient  reports that she has never smoked. She has never used smokeless tobacco. She reports that she does not drink alcohol or use drugs.   Family History: The patient's family history includes Heart disease in her father.   Review of Systems: Please see the history of present illness.   Otherwise, the review of systems is positive for none.   All other systems are reviewed and negative.   Physical Exam: VS:  BP 118/66   Pulse 73   Ht 5\' 3"  (1.6 m)   Wt 172 lb (78 kg)   BMI 30.47 kg/m  .  BMI Body mass index is 30.47 kg/m.  Wt Readings from Last 3 Encounters:  02/05/18 172 lb (78 kg)  12/20/17 172 lb (78 kg)  10/30/17 174 lb 1.9 oz (79 kg)    General: Pleasant. Elderly. Alert and in no acute distress.   HEENT: Normal.  Neck: Supple, no JVD, carotid  bruits, or masses noted.  Cardiac: Regular rate and rhythm. Very soft outflow murmur. No edema.  Respiratory:  Lungs are clear to auscultation bilaterally with normal work of breathing.  GI: Soft and nontender.  MS: No deformity or atrophy. Gait and ROM intact.  Skin: Warm and dry. Color is normal.  Neuro:  Strength and sensation are intact and no gross focal deficits noted.  Psych: Alert,  appropriate and with normal affect.   LABORATORY DATA:  EKG:  EKG is ordered today. This demonstrates NSR with septal Q's - unchanged.  Lab Results  Component Value Date   WBC 4.3 08/29/2017   HGB 10.7 (L) 08/29/2017   HCT 32.7 (L) 08/29/2017   PLT 215 08/29/2017   GLUCOSE 100 (H) 12/17/2017   CHOL 143 08/29/2017   TRIG 57 08/29/2017   HDL 56 08/29/2017   LDLDIRECT 102.8 11/28/2013   LDLCALC 76 08/29/2017   ALT 12 08/29/2017   AST 20 08/29/2017   NA 138 12/17/2017   K 4.1 12/17/2017   CL 102 12/17/2017   CREATININE 1.14 12/17/2017   BUN 22 12/17/2017   CO2 28 12/17/2017   TSH 2.330 08/29/2017   INR 1.57 (H) 06/25/2012   HGBA1C 7.7 (H) 12/17/2017   MICROALBUR 37.8 (H) 05/24/2017     BNP (last 3 results) No results for input(s): BNP in the last 8760 hours.  ProBNP (last 3 results) No results for input(s): PROBNP in the last 8760 hours.   Other Studies Reviewed Today:   EchoStudy Conclusions8/2018  - Left ventricle: The cavity size was normal. Systolic function was normal. The estimated ejection fraction was in the range of 55% to 60%. Wall motion was normal; there were no regional wall motion abnormalities. There was an increased relative contribution of atrial contraction to ventricular filling. Doppler parameters are consistent with abnormal left ventricular relaxation (grade 1 diastolic dysfunction). - Aortic valve: A pericardial bioprosthesis was present and functioning normally. Mean gradient (S): 9 mm Hg. Peak gradient (S): 17 mm Hg. - Mitral  valve: Calcified annulus. Mildly thickened leaflets . There was mild to moderate regurgitation. - Left atrium: The atrium was mildly dilated. - Atrial septum: There was increased thickness of the septum, consistent with lipomatous hypertrophy. - Pulmonary arteries: PA peak pressure: 42 mm Hg (S).  Impressions:  - The right ventricular systolic pressure was increased consistent with moderate pulmonary hypertension.   Myoview Impression from 06/2013 Exercise Capacity: Lexiscan with no exercise. BP Response: Normal blood pressure response. Clinical Symptoms: No significant symptoms noted. ECG Impression: There are scattered PVCs. Comparison with Prior Nuclear Study: No previous nuclear study performed  Overall Impression: Low risk stress nuclear study small fixed distal anterior artifact which could be breast attenuation or other artifact.  LV Ejection Fraction: 66%. LV Wall Motion: Normal Wall Motion  [name], MD, [facility]    Assessment/Plan:  1.CAD - remote CABG 2014 - stable Myoview from 2015 - no active symptoms.   2. Prior AVR for AS - last echo stable from August of 2018.   3. Depression - remains on low dose Lexapro - seems better.   4. HTN - BP is fine.   5. Obesity- activity encouraged.    Current medicines are reviewed with the patient today.  The patient does not have concerns regarding medicines other than what has been noted above.  The following changes have been made:  See above.  Labs/ tests ordered today include:    Orders Placed This Encounter  Procedures  . EKG 12-Lead     Disposition:   FU with me in 4 months. May need to repeat labs on return if not done in the interim.    Patient is agreeable to this plan and will call if any problems develop in the interim.   SignedTruitt Merle, NP  02/05/2018 9:02 AM  [facility] [address]  [place], [place]  [address] Phone:  [phone] Fax: [phone]

## 2018-02-05 NOTE — Patient Instructions (Addendum)
We will be checking the following labs today - NONE  If you have labs (blood work) drawn today and your tests are completely normal, you will receive your results only by: Marland Kitchen MyChart Message (if you have MyChart) OR . A paper copy in the mail If you have any lab test that is abnormal or we need to change your treatment, we will call you to review the results.   Medication Instructions:    Continue with your current medicines.    If you need a refill on your cardiac medications before your next appointment, please call your pharmacy.     Testing/Procedures To Be Arranged:  N/A  Follow-Up:   See me in 4 months and we will do fasting labs on return if not done with Dr. Dwyane Dee    At [facility], you and your health needs are our priority.  As part of our continuing mission to provide you with exceptional heart care, we have created designated Provider Care Teams.  These Care Teams include your primary Cardiologist (physician) and Advanced Practice Providers (APPs -  Physician Assistants and Nurse Practitioners) who all work together to provide you with the care you need, when you need it.   Call the [place] office at [phone] if you have any questions, problems or concerns.

## 2018-02-11 ENCOUNTER — Ambulatory Visit: Payer: Medicare Other | Admitting: Podiatry

## 2018-02-11 ENCOUNTER — Other Ambulatory Visit: Payer: Self-pay | Admitting: Endocrinology

## 2018-02-12 ENCOUNTER — Ambulatory Visit (INDEPENDENT_AMBULATORY_CARE_PROVIDER_SITE_OTHER): Payer: Medicare Other | Admitting: Sports Medicine

## 2018-02-12 ENCOUNTER — Encounter: Payer: Self-pay | Admitting: Sports Medicine

## 2018-02-12 ENCOUNTER — Telehealth: Payer: Self-pay | Admitting: Endocrinology

## 2018-02-12 ENCOUNTER — Other Ambulatory Visit: Payer: Self-pay

## 2018-02-12 DIAGNOSIS — B351 Tinea unguium: Secondary | ICD-10-CM | POA: Diagnosis not present

## 2018-02-12 DIAGNOSIS — M79676 Pain in unspecified toe(s): Secondary | ICD-10-CM

## 2018-02-12 DIAGNOSIS — E1151 Type 2 diabetes mellitus with diabetic peripheral angiopathy without gangrene: Secondary | ICD-10-CM

## 2018-02-12 DIAGNOSIS — L84 Corns and callosities: Secondary | ICD-10-CM

## 2018-02-12 DIAGNOSIS — M2041 Other hammer toe(s) (acquired), right foot: Secondary | ICD-10-CM

## 2018-02-12 MED ORDER — INSULIN GLARGINE 300 UNIT/ML ~~LOC~~ SOPN: 24.0000 [IU] | PEN_INJECTOR | Freq: Every day | SUBCUTANEOUS | 5 refills | Status: DC

## 2018-02-12 MED ORDER — GLUCOSE BLOOD VI STRP: ORAL_STRIP | 5 refills | Status: DC

## 2018-02-12 NOTE — Telephone Encounter (Signed)
Patient is at [place], [place]  Patient needs RX for Toujeo AND test strips with refills sent to the Pharmacy listed herein asap. Patient cannot keep going to the pharmacy to pick up medication that is not there. It is too difficult. She has to depend on other people for transportation.

## 2018-02-12 NOTE — Progress Notes (Signed)
Subjective: [name] is a 84 y.o. female patient with history of diabetes who presents to office today complaining of long,mildly painful nails and callus while ambulating in shoes; unable to trim. Patient states that the glucose reading this morning was 122 mg/dl.  Last visit to her primary care doctor was 3 months ago and reports that her last A1c was 7.7.  Patient states that she is also concerned about her right second toe and wants to talk about surgery but is concerned about how long it will take to recover and what the procedure entails.  Patient denies any new changes in medication or new problems since last office visit.  Patient Active Problem List   Diagnosis Date Noted  . Overactive bladder 10/27/2015  . S/P CABG (coronary artery bypass graft) 07/16/2012  . Type II diabetes mellitus, uncontrolled ([facility]) 06/17/2012  . Essential hypertension 05/20/2012  . Hyperlipidemia 05/20/2012  . Hypothyroidism 05/20/2012  . Aortic stenosis 05/10/2011   Current Outpatient Medications on File Prior to Visit  Medication Sig Dispense Refill  . aspirin EC 81 MG tablet Take 1 tablet (81 mg total) by mouth daily.    . B-D UF III MINI PEN NEEDLES 31G X 5 MM MISC USE 3 PER DAY 100 each 2  . benazepril (LOTENSIN) 20 MG tablet Take 20 mg by mouth daily.     . calcium carbonate (TUMS) 500 MG chewable tablet Chew 1 tablet by mouth daily as needed for heartburn (indigestion).    . Canagliflozin-metFORMIN HCl (INVOKAMET) 50-1000 MG TABS Take 1 tablet by mouth 2 (two) times daily. 180 tablet 1  . carvedilol (COREG) 12.5 MG tablet Take 1 tablet (12.5 mg total) by mouth 2 (two) times daily with a meal. 60 tablet 0  . cholecalciferol (VITAMIN D) 1000 UNITS tablet Take 1,000 Units by mouth daily.    . Coenzyme Q10 (CO Q-10) 100 MG CAPS Take 1 capsule by mouth 2 (two) times daily.    Marland Kitchen escitalopram (LEXAPRO) 10 MG tablet Take 10 mg by mouth daily.    . ferrous sulfate 325 (65 FE) MG tablet Take 325 mg by  mouth daily with breakfast.    . furosemide (LASIX) 20 MG tablet TAKE 1 TABLET (20 MG TOTAL) BY MOUTH DAILY AS NEEDED FOR FLUID OR EDEMA (OR WEIGHT GAIN). 30 tablet 1  . HUMALOG KWIKPEN 100 UNIT/ML KiwkPen INJECT 6 TO 8 UNITS INTO THE SKIN TWICE DAILY 45 mL 0  . levothyroxine (SYNTHROID, LEVOTHROID) 75 MCG tablet TAKE 1 TABLET BY MOUTH EVERY DAY 30 tablet 2  . Multiple Vitamins-Minerals (MULTIVITAMINS THER. W/MINERALS) TABS Take 1 tablet by mouth daily.    Glory Rosebush VERIO test strip TEST THREE TIMES DAILY 200 each 4  . rosuvastatin (CRESTOR) 20 MG tablet TAKE 1 TABLET(20 MG) BY MOUTH DAILY 90 tablet 2  . TOUJEO SOLOSTAR 300 UNIT/ML SOPN INJECT 30 UNITS UNDER THE SKIN DAILY. 5 pen 4   No current facility-administered medications on file prior to visit.    No Known Allergies  Recent Results (from the past 2160 hour(s))  Basic metabolic panel     Status: Abnormal   Collection Time: 12/17/17  8:52 AM  Result Value Ref Range   Sodium 138 135 - 145 mEq/L   Potassium 4.1 3.5 - 5.1 mEq/L   Chloride 102 96 - 112 mEq/L   CO2 28 19 - 32 mEq/L   Glucose, Bld 100 (H) 70 - 99 mg/dL   BUN 22 6 - 23 mg/dL  Creatinine, Ser 1.14 0.40 - 1.20 mg/dL   Calcium 10.2 8.4 - 10.5 mg/dL   GFR 58.58 (L) >60.00 mL/min  Hemoglobin A1c     Status: Abnormal   Collection Time: 12/17/17  8:52 AM  Result Value Ref Range   Hgb A1c MFr Bld 7.7 (H) 4.6 - 6.5 %    Comment: Glycemic Control Guidelines for People with Diabetes:Non Diabetic:  <6%Goal of Therapy: <7%Additional Action Suggested:  >8%     Objective: General: Patient is awake, alert, and oriented x 3 and in no acute distress.  Integument: Skin is warm, dry and supple bilateral. Nails are tender, long, thickened and  dystrophic with subungual debris, consistent with onychomycosis, 1-5 bilateral.  There is mild hyperkeratotic tissue over the right second toe at the interphalangeal joint and at the right fifth toe at the interphalangeal joint.  There is also  callus formation plantar aspect of the right second metatarsal.  No signs of infection.   Vasculature:  Dorsalis Pedis pulse 1/4 bilateral. Posterior Tibial pulse  0/4 bilateral due to trace edema at ankles. Capillary fill time <5 sec 1-5 bilateral.  Diminished hair growth to the level of the digits. Temperature gradient within normal limits. No varicosities present bilateral.  Neurology: The patient has diminished sensation measured with a 5.07/10g Semmes Weinstein Monofilament at all pedal sites bilateral . Vibratory sensation diminished bilateral with tuning fork. No Babinski sign present bilateral.   Musculoskeletal: Pain at right second hammertoe with rigid deformity however there is a small amount of flexibility once I isolated the extensor tendon that may be prone to reduction. Muscular strength 5/5 in all lower extremity muscular groups bilateral without pain on range of motion . No tenderness with calf compression bilateral.  Assessment and Plan: Problem List Items Addressed This Visit    None    Visit Diagnoses    Pain due to onychomycosis of toenail    -  Primary   Diabetic peripheral vascular disorder (HCC)       Corns and callosities       Hammer toe of second toe of right foot          -Examined patient. -Discussed and educated patient on diabetic foot care, especially with  regards to the vascular, neurological and musculoskeletal systems.  -Stressed the importance of good glycemic control and the detriment of not  controlling glucose levels in relation to the foot. -Mechanically debrided all nails 1-5 bilateral using sterile nail nipper and filed with dremel without incident -There was minimal callus today to debride -Discussed with patient treatment options for hammertoe and patient immediately decided that she did not want anything invasive for this toe however I did tell patient it is concerning because she is diabetic and that the greatest circulation on her  possibilities to heal however we want to avoid any potential risk for ulceration especially since her toe is severely hammered and poses a risk of rubbing so I encouraged good supportive shoes cushioning and also patient to discuss with her primary care doctor as well about any possible consideration for a tenotomy procedure before we decide anything and advised patient that she would need extensive medical clearance including vascular even with a in office tenotomy procedure if she is decides this is something she wants to try for her right second hammertoe -Answered all patient questions -Patient to return  in 3 months for at risk foot care or sooner if the right second hammertoe bothers her or if she wants to  further discuss treatment options/procedure -Patient advised to call the office if any problems or questions arise in the meantime.  Landis Martins, DPM

## 2018-02-12 NOTE — Patient Instructions (Signed)
Tenotomy (tendon release of right 2nd toe) in office procedure to help with hammertoe and painful rubbing/corn at 2nd toe Talk with PCP about me doing this in office procedure

## 2018-02-12 NOTE — Telephone Encounter (Signed)
This has been done.

## 2018-02-13 ENCOUNTER — Ambulatory Visit: Payer: Medicare Other | Admitting: Podiatry

## 2018-02-14 DIAGNOSIS — R1084 Generalized abdominal pain: Secondary | ICD-10-CM | POA: Diagnosis not present

## 2018-03-04 DIAGNOSIS — E113391 Type 2 diabetes mellitus with moderate nonproliferative diabetic retinopathy without macular edema, right eye: Secondary | ICD-10-CM | POA: Diagnosis not present

## 2018-03-04 DIAGNOSIS — H3540 Unspecified peripheral retinal degeneration: Secondary | ICD-10-CM | POA: Diagnosis not present

## 2018-03-04 DIAGNOSIS — H35033 Hypertensive retinopathy, bilateral: Secondary | ICD-10-CM | POA: Diagnosis not present

## 2018-03-04 DIAGNOSIS — E113312 Type 2 diabetes mellitus with moderate nonproliferative diabetic retinopathy with macular edema, left eye: Secondary | ICD-10-CM | POA: Diagnosis not present

## 2018-03-12 DIAGNOSIS — E119 Type 2 diabetes mellitus without complications: Secondary | ICD-10-CM | POA: Diagnosis not present

## 2018-03-12 DIAGNOSIS — R413 Other amnesia: Secondary | ICD-10-CM | POA: Diagnosis not present

## 2018-03-12 DIAGNOSIS — I10 Essential (primary) hypertension: Secondary | ICD-10-CM | POA: Diagnosis not present

## 2018-03-12 DIAGNOSIS — J309 Allergic rhinitis, unspecified: Secondary | ICD-10-CM | POA: Diagnosis not present

## 2018-03-18 ENCOUNTER — Other Ambulatory Visit: Payer: Medicare Other

## 2018-03-20 ENCOUNTER — Other Ambulatory Visit (INDEPENDENT_AMBULATORY_CARE_PROVIDER_SITE_OTHER): Payer: Medicare Other

## 2018-03-20 DIAGNOSIS — E1165 Type 2 diabetes mellitus with hyperglycemia: Secondary | ICD-10-CM

## 2018-03-20 DIAGNOSIS — Z794 Long term (current) use of insulin: Secondary | ICD-10-CM

## 2018-03-20 DIAGNOSIS — E063 Autoimmune thyroiditis: Secondary | ICD-10-CM | POA: Diagnosis not present

## 2018-03-20 LAB — COMPREHENSIVE METABOLIC PANEL
ALT: 12 U/L (ref 0–35)
AST: 19 U/L (ref 0–37)
Albumin: 4.3 g/dL (ref 3.5–5.2)
Alkaline Phosphatase: 42 U/L (ref 39–117)
BUN: 17 mg/dL (ref 6–23)
CALCIUM: 9.8 mg/dL (ref 8.4–10.5)
CHLORIDE: 99 meq/L (ref 96–112)
CO2: 27 meq/L (ref 19–32)
CREATININE: 0.95 mg/dL (ref 0.40–1.20)
GFR: 72.25 mL/min (ref >60.00)
Glucose, Bld: 242 mg/dL — ABNORMAL HIGH (ref 70–99)
Potassium: 4 mEq/L (ref 3.5–5.1)
Sodium: 136 mEq/L (ref 135–145)
Total Bilirubin: 0.4 mg/dL (ref 0.2–1.2)
Total Protein: 7.9 g/dL (ref 6.0–8.3)

## 2018-03-20 LAB — TSH: TSH: 3.33 u[IU]/mL (ref 0.35–4.50)

## 2018-03-20 LAB — MICROALBUMIN / CREATININE URINE RATIO
CREATININE, U: 106.4 mg/dL
MICROALB UR: 0.7 mg/dL (ref 0.0–1.9)
Microalb Creat Ratio: 0.7 mg/g (ref 0.0–30.0)

## 2018-03-20 LAB — HEMOGLOBIN A1C: Hgb A1c MFr Bld: 7.5 % — ABNORMAL HIGH (ref 4.6–6.5)

## 2018-03-20 LAB — T4, FREE: Free T4: 0.92 ng/dL (ref 0.60–1.60)

## 2018-03-21 NOTE — Progress Notes (Signed)
[name] 84 y.o.            Reason for Appointment: Diabetes follow-up   History of Present Illness   Diagnosis: Type 2 DIABETES MELITUS, date of diagnosis:   2000   She has been on insulin since 2004 with difficulty controlling adequately because of compliance with diet and variability in blood sugars. Her previous records are not available at present, however she has been on basal insulin with mealtime coverage since at least 2007 She has been tried on Byetta previously but did not tolerate this well She tried Victoza but didn't not benefit from Victoza 0.6 dosage; had nausea from 1.2 mg; did not continue this because of this side effect She also was given a trial of the V-go pump but she subjectively did not like this  RECENT history:    INSULIN: Toujeo 22 units in morning, HUMALOG 6  acb, 6 lunch and 4 at supper    Oral hypoglycemic drugs: Metformin er 750 mg and Invokamet 50/1000, half tablet at supper,    A1c is usually around 8%, slightly better now at 7.5   She has the following blood sugar patterns and problems identified  As before her blood sugars are difficult to control postprandially because of her not taking enough insulin or taking her insulin on time before eating  This is again frequently a problem after lunch but recently has not checked readings in the afternoons  Otherwise average sugar is nearly 200 after lunch  She will sometimes have high fat foods like fried fish and would like to adjust her insulin based on that  Also after breakfast her blood sugar was significantly high in the lab but she is not checking the readings in the midmorning at home  As before may sometimes eat food and cereal in the morning but other days have some protein  Again occasionally will have low protein breakfast in the morning with a low sugar occurring at lunchtime  FASTING blood sugars are excellent and mostly near normal especially recently  HYPOGLYCEMIA has  been rare  Again at times may take excessive insulin in the evenings when she is taking very little insulin and likely took extra insulin when her blood sugar was 184 after supper causing her sugar to go down to 43  Currently concerned about the cost of Humalog in the donut hole  However continues to take Invokamet as prescribed      .         Monitors blood glucose:  about 2-3 times daily     Glucometer: One Touch Verio.          Blood Glucose readings from download:     PRE-MEAL Fasting Lunch Dinner Bedtime Overall  Glucose range:  77-178   94-268  43-277  Mean/median:  112     131+/-54   POST-MEAL PC Breakfast PC Lunch PC Dinner  Glucose range:   107-277   Mean/median:   196  199   PREVIOUS readings:  PRE-MEAL Fasting Lunch Dinner Bedtime Overall  Glucose range:  95-165   40-159    Mean/median: 120   187  120   POST-MEAL PC Breakfast PC Lunch PC Dinner  Glucose range:   179  48-304  Mean/median:    154    Meals: 3 meals per day:  breakfast is At 7 am;  lunch 12-2 pm, dinner 5 pm Intake is quite variable at different meals, usually lighter supper    Wt Readings  from Last 3 Encounters:  03/22/18 175 lb (79.4 kg)  02/05/18 172 lb (78 kg)  12/20/17 172 lb (78 kg)    Lab Results  Component Value Date   HGBA1C 7.5 (H) 03/20/2018   HGBA1C 7.7 (H) 12/17/2017   HGBA1C 8.1 (H) 09/11/2017   Lab Results  Component Value Date   MICROALBUR 0.7 03/20/2018   LDLCALC 76 08/29/2017   CREATININE 0.95 03/20/2018    Lab on 03/20/2018  Component Date Value Ref Range Status  . Free T4 03/20/2018 0.92  0.60 - 1.60 ng/dL Final   Comment: Specimens from patients who are undergoing biotin therapy and /or ingesting biotin supplements may contain high levels of biotin.  The higher biotin concentration in these specimens interferes with this Free T4 assay.  Specimens that contain high levels  of biotin may cause false high results for this Free T4 assay.  Please interpret results in  light of the total clinical presentation of the patient.    Marland Kitchen TSH 03/20/2018 3.33  0.35 - 4.50 uIU/mL Final  . Microalb, Ur 03/20/2018 0.7  0.0 - 1.9 mg/dL Final  . Creatinine,U 03/20/2018 106.4  mg/dL Final  . Microalb Creat Ratio 03/20/2018 0.7  0.0 - 30.0 mg/g Final  . Sodium 03/20/2018 136  135 - 145 mEq/L Final  . Potassium 03/20/2018 4.0  3.5 - 5.1 mEq/L Final  . Chloride 03/20/2018 99  96 - 112 mEq/L Final  . CO2 03/20/2018 27  19 - 32 mEq/L Final  . Glucose, Bld 03/20/2018 242* 70 - 99 mg/dL Final  . BUN 03/20/2018 17  6 - 23 mg/dL Final  . Creatinine, Ser 03/20/2018 0.95  0.40 - 1.20 mg/dL Final  . Total Bilirubin 03/20/2018 0.4  0.2 - 1.2 mg/dL Final  . Alkaline Phosphatase 03/20/2018 42  39 - 117 U/L Final  . AST 03/20/2018 19  0 - 37 U/L Final  . ALT 03/20/2018 12  0 - 35 U/L Final  . Total Protein 03/20/2018 7.9  6.0 - 8.3 g/dL Final  . Albumin 03/20/2018 4.3  3.5 - 5.2 g/dL Final  . Calcium 03/20/2018 9.8  8.4 - 10.5 mg/dL Final  . GFR 03/20/2018 72.25  >60.00 mL/min Final  . Hgb A1c MFr Bld 03/20/2018 7.5* 4.6 - 6.5 % Final   Glycemic Control Guidelines for People with Diabetes:Non Diabetic:  <6%Goal of Therapy: <7%Additional Action Suggested:  >8%      Allergies as of 03/22/2018   No Known Allergies     Medication List        Accurate as of 03/22/18 10:08 AM. Always use your most recent med list.          aspirin EC 81 MG tablet Take 1 tablet (81 mg total) by mouth daily.   B-D UF III MINI PEN NEEDLES 31G X 5 MM Misc Generic drug:  Insulin Pen Needle USE 3 PER DAY   benazepril 20 MG tablet Commonly known as:  LOTENSIN Take 20 mg by mouth daily.   Canagliflozin-metFORMIN HCl 50-1000 MG Tabs Take 1 tablet by mouth 2 (two) times daily.   carvedilol 12.5 MG tablet Commonly known as:  COREG Take 1 tablet (12.5 mg total) by mouth 2 (two) times daily with a meal.   cholecalciferol 1000 units tablet Commonly known as:  VITAMIN D Take 1,000 Units by  mouth daily.   Co Q-10 100 MG Caps Take 1 capsule by mouth 2 (two) times daily.   ferrous sulfate 325 (65 FE) MG  tablet Take 325 mg by mouth daily with breakfast.   furosemide 20 MG tablet Commonly known as:  LASIX TAKE 1 TABLET (20 MG TOTAL) BY MOUTH DAILY AS NEEDED FOR FLUID OR EDEMA (OR WEIGHT GAIN).   HUMALOG KWIKPEN 100 UNIT/ML KwikPen Generic drug:  insulin lispro INJECT 6 TO 8 UNITS INTO THE SKIN TWICE DAILY   levothyroxine 75 MCG tablet Commonly known as:  SYNTHROID, LEVOTHROID TAKE 1 TABLET BY MOUTH EVERY DAY   multivitamins ther. w/minerals Tabs tablet Take 1 tablet by mouth daily.   ONETOUCH VERIO test strip Generic drug:  glucose blood TEST THREE TIMES DAILY   glucose blood test strip USE AS DIRECTED THREE TIMES DAILY.   rosuvastatin 20 MG tablet Commonly known as:  CRESTOR TAKE 1 TABLET(20 MG) BY MOUTH DAILY   TOUJEO SOLOSTAR 300 UNIT/ML Sopn Generic drug:  Insulin Glargine (1 Unit Dial) INJECT 30 UNITS UNDER THE SKIN DAILY.   TUMS 500 MG chewable tablet Generic drug:  calcium carbonate Chew 1 tablet by mouth daily as needed for heartburn (indigestion).       Allergies: No Known Allergies  Past Medical History:  Diagnosis Date  . Aortic valve disorders    S/P AVR 06/2012  . Arthritis   . Asthma    AS CHILD   . Depression   . Diabetes mellitus   . Heart murmur   . Hyperlipidemia   . Hypertension   . Hypothyroidism   . Ischemic heart disease    with prior CABG x 2 in 2014 along with AVR  . Lumbago   . Shortness of breath   . Thyroid disease     Past Surgical History:  Procedure Laterality Date  . AORTIC VALVE REPLACEMENT N/A 06/25/2012   Procedure: AORTIC VALVE REPLACEMENT (AVR);  Surgeon: Grace Isaac, MD;  Location: [place];  Service: Open Heart Surgery;  Laterality: N/A;  . BREAST BIOPSY Right 08/2016   benign  . BREAST SURGERY     LT BREAST BX BENIGN  . CORONARY ARTERY BYPASS GRAFT N/A 06/25/2012   Procedure: Coronary artery  bypass graft  times two using left internal mammary artery and right leg greater saphenous vein;  Surgeon: Grace Isaac, MD;  Location: [place];  Service: Open Heart Surgery;  Laterality: N/A;  . INTRAOPERATIVE TRANSESOPHAGEAL ECHOCARDIOGRAM N/A 06/25/2012   Procedure: INTRAOPERATIVE TRANSESOPHAGEAL ECHOCARDIOGRAM;  Surgeon: Grace Isaac, MD;  Location: [place];  Service: Open Heart Surgery;  Laterality: N/A;  . TUBAL LIGATION      Family History  Problem Relation Age of Onset  . Heart disease Father     Social History:  reports that she has never smoked. She has never used smokeless tobacco. She reports that she does not drink alcohol or use drugs.  Review of Systems:   HYPERTENSION: Blood pressure is usually followed by PCP Treatment includes benazepril Recent blood pressure readings as follows  BP Readings from Last 3 Encounters:  03/22/18 132/80  02/05/18 118/66  12/20/17 138/80     HYPOTHYROIDISM: She has had long-standing hypothyroidism  She is compliant with her levothyroxine 75 g dose No fatigue recently  TSH has been consistent in the normal range   Lab Results  Component Value Date   TSH 3.33 03/20/2018   TSH 2.330 08/29/2017   TSH 0.92 01/22/2017   FREET4 0.92 03/20/2018   FREET4 1.01 09/11/2017   FREET4 1.13 06/02/2015    HYPERLIPIDEMIA: The lipid abnormality consists of elevated LDL; has been  controlled  with Crestor 20 mg LDL is below   100  Lab Results  Component Value Date   CHOL 143 08/29/2017   HDL 56 08/29/2017   LDLCALC 76 08/29/2017   LDLDIRECT 102.8 11/28/2013   TRIG 57 08/29/2017   CHOLHDL 2.6 08/29/2017  '    She does see a podiatrist regularly 1/19, no new findings  Last foot exam Here was in 12/16: Diabetic foot exam shows normal monofilament sensation in the toes and plantar surfaces, no skin lesions or ulcers on the feet and normal pedal pulses    Examination:   BP 132/80   Pulse 80   Ht 5\' 3"  (1.6 m)   Wt 175 lb  (79.4 kg)   SpO2 97%   BMI 31.00 kg/m   Body mass index is 31 kg/m.    ASSESSMENT/ PLAN:   Diabetes type 2, uncontrolled   See history of present illness for a discussion of the blood sugar patterns, recent management and problems identified  A1c is 7.5  However she still has significant postprandial hyperglycemia with average blood sugar nearly 200 after meals especially breakfast and lunch but she is not monitoring enough  Discussed day-to-day management again especially mealtime insulin with needing to adjust the dose based on type of meal Needs to consistently take her mealtime insulin with her when eating out To add at least 2 units more for higher fat foods at lunchtime Also additional 2 minutes before eating cereal in the morning She does need to add some protein in the morning also at breakfast consistently She should at least check sugar sugars before lunch and sometimes after meals at all times Discussed that she should not take extra insulin late in the evening if blood sugars are moderately increased to avoid hypoglycemia With blood sugar is low normal at times in the morning she can reduce Toujeo to 20 units Continue Invokamet  Follow-up in 3 months with A1c again  HYPERTENSION: Well-controlled and she is on an ACE inhibitor Currently no microalbuminuria  Hypothyroidism: Treated with 75 mcg and TSH still normal with this    Counseling time on subjects discussed in assessment and plan sections is over 50% of today's 25 minute visit   Patient Instructions  Take 8 Units in am if eating cereal  No insulin after supper, only before   20 toujeo in am  Always take Humalog BEFORE lunch; 2 more Units if fried        Elayne Snare 03/22/2018, 10:08 AM   Note: This office note was prepared with Dragon voice recognition system technology. Any transcriptional errors that result from this process are unintentional.

## 2018-03-22 ENCOUNTER — Ambulatory Visit (INDEPENDENT_AMBULATORY_CARE_PROVIDER_SITE_OTHER): Payer: Medicare Other | Admitting: Endocrinology

## 2018-03-22 ENCOUNTER — Ambulatory Visit: Payer: Medicare Other | Admitting: Endocrinology

## 2018-03-22 ENCOUNTER — Encounter: Payer: Self-pay | Admitting: Endocrinology

## 2018-03-22 VITALS — BP 132/80 | HR 80 | Ht 63.0 in | Wt 175.0 lb

## 2018-03-22 DIAGNOSIS — I10 Essential (primary) hypertension: Secondary | ICD-10-CM

## 2018-03-22 DIAGNOSIS — E1165 Type 2 diabetes mellitus with hyperglycemia: Secondary | ICD-10-CM | POA: Diagnosis not present

## 2018-03-22 DIAGNOSIS — E7800 Pure hypercholesterolemia, unspecified: Secondary | ICD-10-CM

## 2018-03-22 DIAGNOSIS — I259 Chronic ischemic heart disease, unspecified: Secondary | ICD-10-CM

## 2018-03-22 DIAGNOSIS — Z794 Long term (current) use of insulin: Secondary | ICD-10-CM | POA: Diagnosis not present

## 2018-03-22 NOTE — Patient Instructions (Addendum)
Take 8 Units in am if eating cereal  No insulin after supper, only before   20 toujeo in am  Always take Humalog BEFORE lunch; 2 more Units if fried  More sugars after lunch

## 2018-03-28 ENCOUNTER — Other Ambulatory Visit: Payer: Self-pay | Admitting: Nurse Practitioner

## 2018-04-19 ENCOUNTER — Encounter (HOSPITAL_COMMUNITY): Payer: Self-pay | Admitting: Emergency Medicine

## 2018-04-19 ENCOUNTER — Ambulatory Visit (HOSPITAL_COMMUNITY)
Admission: EM | Admit: 2018-04-19 | Discharge: 2018-04-19 | Disposition: A | Discharge status: Home / Self Care | Payer: Medicare Other | Attending: Internal Medicine | Admitting: Internal Medicine

## 2018-04-19 DIAGNOSIS — T383X1A Poisoning by insulin and oral hypoglycemic [antidiabetic] drugs, accidental (unintentional), initial encounter: Secondary | ICD-10-CM | POA: Diagnosis not present

## 2018-04-19 LAB — GLUCOSE, CAPILLARY
GLUCOSE-CAPILLARY: 183 mg/dL — AB (ref 70–99)
GLUCOSE-CAPILLARY: 117 mg/dL — AB (ref 70–99)
Glucose-Capillary: 58 mg/dL — ABNORMAL LOW (ref 70–99)
Glucose-Capillary: 117 mg/dL — ABNORMAL HIGH (ref 70–99)

## 2018-04-19 NOTE — ED Notes (Signed)
Provider requested that patient be provided with a Coke

## 2018-04-19 NOTE — Discharge Instructions (Addendum)
It was my pleasure taking care of you today!   Please monitor your glucose at least once every hour.    Eat a snack and/or drink juice if you are glucose levels are less than 80.    Do not take your normal insulin tonight unless your glucose level is above 300.    It is okay to take your metformin tonight.    You may resume your usual insulin regiment in the morning.    Go to the emergency room immediately if you start to experience symptoms of low blood sugar (Sweating and feeling clammy/Dizziness or feeling light-headed/Sleepiness/Nausea/Increased heart rate/Headache/Blurry vision/Irritability/Tingling or numbness around the mouth, lips, or tongue/Any change in coordination/Restless sleep) or if you are unable to consistently keep your glucose levels above 80 or any other concerning symptom(s).  I hope you have a very Merry Christmas!!!

## 2018-04-19 NOTE — ED Provider Notes (Signed)
[place]    CSN: [id number] Arrival date & time: 04/19/18  9735     History   Chief Complaint Chief Complaint  Patient presents with  . Diabetes    HPI [name] is a 84 y.o. female.   Subjective:   [name] is a 84 y.o. female who presents due to accidentally taking too much insulin. She was supposed to take Humalog 6 units and Toujeo 18 units this morning. Both medications are in autopens that are stored in her refrigerator. She reports that she administered the 6 units of Humalog at 7 am and then accidentally injuected 16 of the 18 units of Humalog instead of the toujeo. She immediately consumed some orange juice. She denies any hypoglycemia, nausea, visual disturbances and vomiting.   The following portions of the patient's history were reviewed and updated as appropriate: allergies, current medications, past family history, past medical history, past social history, past surgical history and problem list.       Past Medical History:  Diagnosis Date  . Aortic valve disorders    S/P AVR 06/2012  . Arthritis   . Asthma    AS CHILD   . Depression   . Diabetes mellitus   . Heart murmur   . Hyperlipidemia   . Hypertension   . Hypothyroidism   . Ischemic heart disease    with prior CABG x 2 in 2014 along with AVR  . Lumbago   . Shortness of breath   . Thyroid disease     Patient Active Problem List   Diagnosis Date Noted  . Overactive bladder 10/27/2015  . S/P CABG (coronary artery bypass graft) 07/16/2012  . Type II diabetes mellitus, uncontrolled ([place]) 06/17/2012  . Essential hypertension 05/20/2012  . Hyperlipidemia 05/20/2012  . Hypothyroidism 05/20/2012  . Aortic stenosis 05/10/2011    Past Surgical History:  Procedure Laterality Date  . AORTIC VALVE REPLACEMENT N/A 06/25/2012   Procedure: AORTIC VALVE REPLACEMENT (AVR);  Surgeon: Grace Isaac, MD;  Location: [place];  Service: Open Heart Surgery;  Laterality: N/A;    . BREAST BIOPSY Right 08/2016   benign  . BREAST SURGERY     LT BREAST BX BENIGN  . CORONARY ARTERY BYPASS GRAFT N/A 06/25/2012   Procedure: Coronary artery bypass graft  times two using left internal mammary artery and right leg greater saphenous vein;  Surgeon: Grace Isaac, MD;  Location: [place];  Service: Open Heart Surgery;  Laterality: N/A;  . INTRAOPERATIVE TRANSESOPHAGEAL ECHOCARDIOGRAM N/A 06/25/2012   Procedure: INTRAOPERATIVE TRANSESOPHAGEAL ECHOCARDIOGRAM;  Surgeon: Grace Isaac, MD;  Location: [place];  Service: Open Heart Surgery;  Laterality: N/A;  . TUBAL LIGATION      OB History   No obstetric history on file.      Home Medications    Prior to Admission medications   Medication Sig Start Date End Date Taking? Authorizing Provider  aspirin EC 81 MG tablet Take 1 tablet (81 mg total) by mouth daily. 09/10/13  Yes Nahser, Wonda Cheng, MD  B-D UF III MINI PEN NEEDLES 31G X 5 MM MISC USE 3 PER DAY 10/23/16  Yes Elayne Snare, MD  benazepril (LOTENSIN) 20 MG tablet Take 20 mg by mouth daily.    Yes [provider]  calcium carbonate (TUMS) 500 MG chewable tablet Chew 1 tablet by mouth daily as needed for heartburn (indigestion).   Yes [provider]  Canagliflozin-metFORMIN HCl (INVOKAMET) 50-1000 MG TABS Take 1 tablet  by mouth 2 (two) times daily. 09/17/17  Yes Elayne Snare, MD  carvedilol (COREG) 12.5 MG tablet Take 1 tablet (12.5 mg total) by mouth 2 (two) times daily with a meal. 05/29/17  Yes Ward, Kristen N, DO  carvedilol (COREG) 12.5 MG tablet TAKE 1 TABLET BY MOUTH TWICE A DAY 04/01/18  Yes Burtis Junes, NP  cholecalciferol (VITAMIN D) 1000 UNITS tablet Take 1,000 Units by mouth daily.   Yes [provider]  Coenzyme Q10 (CO Q-10) 100 MG CAPS Take 1 capsule by mouth 2 (two) times daily.   Yes [provider]  ferrous sulfate 325 (65 FE) MG tablet Take 325 mg by mouth daily with breakfast.   Yes [provider]   furosemide (LASIX) 20 MG tablet TAKE 1 TABLET (20 MG TOTAL) BY MOUTH DAILY AS NEEDED FOR FLUID OR EDEMA (OR WEIGHT GAIN). 10/06/16  Yes Truitt Merle C, NP  glucose blood (ONETOUCH VERIO) test strip USE AS DIRECTED THREE TIMES DAILY. 02/12/18  Yes Elayne Snare, MD  HUMALOG KWIKPEN 100 UNIT/ML KiwkPen INJECT 6 TO 8 UNITS INTO THE SKIN TWICE DAILY 12/24/17  Yes Elayne Snare, MD  levothyroxine (SYNTHROID, LEVOTHROID) 75 MCG tablet TAKE 1 TABLET BY MOUTH EVERY DAY 09/09/17  Yes Elayne Snare, MD  Multiple Vitamins-Minerals (MULTIVITAMINS THER. W/MINERALS) TABS Take 1 tablet by mouth daily.   Yes [provider]  [facility] VERIO test strip TEST THREE TIMES DAILY 02/12/18  Yes Elayne Snare, MD  rosuvastatin (CRESTOR) 20 MG tablet TAKE 1 TABLET(20 MG) BY MOUTH DAILY 09/10/17  Yes Elayne Snare, MD  TOUJEO SOLOSTAR 300 UNIT/ML SOPN INJECT 30 UNITS UNDER THE SKIN DAILY. 02/12/18  Yes Elayne Snare, MD    Family History Family History  Problem Relation Age of Onset  . Heart disease Father     Social History Social History   Tobacco Use  . Smoking status: Never Smoker  . Smokeless tobacco: Never Used  Substance Use Topics  . Alcohol use: No  . Drug use: No     Allergies   Patient has no known allergies.   Review of Systems Review of Systems  Constitutional: Negative for chills and diaphoresis.  Gastrointestinal: Negative for abdominal pain, nausea and vomiting.  Neurological: Negative for dizziness and light-headedness.  All other systems reviewed and are negative.    Physical Exam Triage Vital Signs ED Triage Vitals  Enc Vitals Group     BP 04/19/18 0827 (!) 181/84     Pulse Rate 04/19/18 0827 76     Resp 04/19/18 0827 16     Temp 04/19/18 0827 98.2 F (36.8 C)     Temp Source 04/19/18 0827 Oral     SpO2 04/19/18 0827 100 %     Weight --      Height --      Head Circumference --      Peak Flow --      Pain Score 04/19/18 0832 0     Pain Loc --      Pain Edu? --       Excl. in [place]? --    No data found.  Updated Vital Signs BP (!) 181/84 (BP Location: Left Arm)   Pulse 76   Temp 98.2 F (36.8 C) (Oral)   Resp 16   SpO2 100%   Visual Acuity Right Eye Distance:   Left Eye Distance:   Bilateral Distance:    Right Eye Near:   Left Eye Near:    Bilateral Near:  Physical Exam Constitutional:      General: She is not in acute distress.    Appearance: Normal appearance. She is not toxic-appearing or diaphoretic.  HENT:     Head: Normocephalic.     Mouth/Throat:     Mouth: Mucous membranes are moist.  Neck:     Musculoskeletal: Normal range of motion and neck supple.  Cardiovascular:     Rate and Rhythm: Normal rate and regular rhythm.  Pulmonary:     Effort: Pulmonary effort is normal.     Breath sounds: Normal breath sounds.  Musculoskeletal: Normal range of motion.  Skin:    General: Skin is warm and dry.  Neurological:     General: No focal deficit present.     Mental Status: She is alert and oriented to person, place, and time.  Psychiatric:        Mood and Affect: Mood normal.        Behavior: Behavior normal.        Thought Content: Thought content normal.        Judgment: Judgment normal.      UC Treatments / Results  Labs (all labs ordered are listed, but only abnormal results are displayed) Labs Reviewed  GLUCOSE, CAPILLARY - Abnormal; Notable for the following components:      Result Value   Glucose-Capillary 183 (*)    All other components within normal limits  GLUCOSE, CAPILLARY - Abnormal; Notable for the following components:   Glucose-Capillary 117 (*)    All other components within normal limits  GLUCOSE, CAPILLARY - Abnormal; Notable for the following components:   Glucose-Capillary 58 (*)    All other components within normal limits  GLUCOSE, CAPILLARY - Abnormal; Notable for the following components:   Glucose-Capillary 117 (*)    All other components within normal limits  CBG MONITORING, ED  CBG  MONITORING, ED  CBG MONITORING, ED  CBG MONITORING, ED  CBG MONITORING, ED  CBG MONITORING, ED  CBG MONITORING, ED  CBG MONITORING, ED  CBG MONITORING, ED  CBG MONITORING, ED  CBG MONITORING, ED  CBG MONITORING, ED  CBG MONITORING, ED  CBG MONITORING, ED  CBG MONITORING, ED    EKG None  Radiology No results found.  Procedures Procedures (including critical care time)  Medications Ordered in UC Medications - No data to display  Initial Impression / Assessment and Plan / UC Course  I have reviewed the triage vital signs and the nursing notes.  Pertinent labs & imaging results that were available during my care of the patient were reviewed by me and considered in my medical decision making (see chart for details).    84 yo female with history of insulin-dependent diabetes presents today due to actually taking too much of her Humalog insulin.  Patient is asymptomatic.  We have monitored her glucose over the past couple of hours (183, 117, 58, 117).  Glucose dropped significantly at the expected peak of the insulin which was about 3 hours after injection. Patient remains asymptomatic. She was given soda, crackers and peanut butter. Glucose within normal parameters. Patient requesting to go home at this time. She lives with her sister who will be able to watch out after her as well. Patient is alert and oriented. No focal deficits noted. Patient advised to monitor her glucose at least once every hour for the remainder of the day.  She is to consume a snack if her sugars are less than 80.  She typically takes half  the dose of her Humalog at night as well as metformin.  Advised patient to hold insulin for tonight unless her glucose is over 300.  She may take her metformin tonight as usual.  Patient may resume her usual insulin regimen in the morning.  Discussed indications for immediate ED follow-up.  Today's evaluation has revealed no signs of a dangerous process. Discussed diagnosis  with patient. Patient aware of their diagnosis, possible red flag symptoms to watch out for and need for close follow up. Patient understands verbal and written discharge instructions. Patient comfortable with plan and disposition.  Patient has a clear mental status at this time, good insight into illness (after discussion and teaching) and has clear judgment to make decisions regarding their care. Final Clinical Impressions(s) / UC Diagnoses   Final diagnoses:  Poisoning by insulin, accidental or unintentional, initial encounter     Discharge Instructions     It was my pleasure taking care of you today!   Please monitor your glucose at least once every hour.    Eat a snack and/or drink juice if you are glucose levels are less than 80.    Do not take your normal insulin tonight unless your glucose level is above 300.    It is okay to take your metformin tonight.    You may resume your usual insulin regiment in the morning.    Go to the emergency room immediately if you start to experience symptoms of low blood sugar (Sweating and feeling clammy/Dizziness or feeling light-headed/Sleepiness/Nausea/Increased heart rate/Headache/Blurry vision/Irritability/Tingling or numbness around the mouth, lips, or tongue/Any change in coordination/Restless sleep) or if you are unable to consistently keep your glucose levels above 80 or any other concerning symptom(s).  I hope you have a very Merry Christmas!!!           ED Prescriptions    None     Controlled Substance Prescriptions [place] Controlled Substance Registry consulted? Not Applicable   Enrique Sack, [place] 04/19/18 1230

## 2018-04-19 NOTE — ED Triage Notes (Signed)
Pt reports she took too much insulin this morning  Sts she takes 6 units Humolog every morning and 20 units of Toujeo but instead of 20 units of Toujeo she took 16 units of Humolog... Reports she had orange juice after she realized her mistake.   Blood sugar this am was 121 when she woke up.... Pt sts she rechecked her blood sugar while in the waiting room and it was 269.    Pt is talking in complete sentences ... A&IO x4... no acute distress... Younger sister is here w/pt.   Reports feeling week of bilateral legs.   Denies drowsy, nauseas, HA

## 2018-05-15 ENCOUNTER — Ambulatory Visit (INDEPENDENT_AMBULATORY_CARE_PROVIDER_SITE_OTHER): Payer: Medicare Other | Admitting: Podiatry

## 2018-05-15 ENCOUNTER — Encounter: Payer: Self-pay | Admitting: Podiatry

## 2018-05-15 DIAGNOSIS — M79676 Pain in unspecified toe(s): Secondary | ICD-10-CM | POA: Diagnosis not present

## 2018-05-15 DIAGNOSIS — L84 Corns and callosities: Secondary | ICD-10-CM

## 2018-05-15 DIAGNOSIS — M2041 Other hammer toe(s) (acquired), right foot: Secondary | ICD-10-CM

## 2018-05-15 DIAGNOSIS — B351 Tinea unguium: Secondary | ICD-10-CM | POA: Diagnosis not present

## 2018-05-15 DIAGNOSIS — E1151 Type 2 diabetes mellitus with diabetic peripheral angiopathy without gangrene: Secondary | ICD-10-CM | POA: Diagnosis not present

## 2018-05-15 NOTE — Progress Notes (Signed)
Complaint:  Visit Type: Patient returns to my office for continued preventative foot care services. Complaint: Patient states" my nails have grown long and thick and become painful to walk and wear shoes" Patient has been diagnosed with DM with no foot complications. The patient presents for preventative foot care services. No changes to ROS.  Patient has painful hammer toe second right foot.  Podiatric Exam: Vascular: dorsalis pedis are palpable bilateral.  Posterior tibial pulses are not palpable.Capillary return is immediate. Temperature gradient is WNL. Skin turgor WNL  Sensorium: Diminished  Semmes Weinstein monofilament test. Normal tactile sensation bilaterally. Nail Exam: Pt has thick disfigured discolored nails with subungual debris noted bilateral entire nail hallux through fifth toenails Ulcer Exam: There is no evidence of ulcer or pre-ulcerative changes or infection. Orthopedic Exam: Muscle tone and strength are WNL. No limitations in general ROM. No crepitus or effusions noted. Foot type and digits show no abnormalities. HAV  B/L.  Hammer toe second right. Skin: No Porokeratosis asymptomatic.Marland Kitchen No infection or ulcers.  HD second toe right foot.  Diagnosis:  Onychomycosis, , Pain in right toe, pain in left toes  Treatment & Plan Procedures and Treatment: Consent by patient was obtained for treatment procedures.   Debridement of mycotic and hypertrophic toenails, 1 through 5 bilateral and clearing of subungual debris. No ulceration, no infection noted. Debride heloma durum second toe right foot.  Told to make an appointment with Dr.  Cannon Kettle for surgical consult. ABN signed for 2020. Return Visit-Office Procedure: Patient instructed to return to the office for a follow up visit 3 months for continued evaluation and treatment.    Gardiner Barefoot DPM

## 2018-05-24 ENCOUNTER — Other Ambulatory Visit: Payer: Self-pay | Admitting: Endocrinology

## 2018-05-28 ENCOUNTER — Other Ambulatory Visit: Payer: Self-pay | Admitting: Nurse Practitioner

## 2018-06-14 ENCOUNTER — Other Ambulatory Visit: Payer: Self-pay | Admitting: Endocrinology

## 2018-06-17 ENCOUNTER — Ambulatory Visit (INDEPENDENT_AMBULATORY_CARE_PROVIDER_SITE_OTHER): Payer: Medicare Other | Admitting: Nurse Practitioner

## 2018-06-17 ENCOUNTER — Encounter: Payer: Self-pay | Admitting: Nurse Practitioner

## 2018-06-17 ENCOUNTER — Telehealth: Payer: Self-pay

## 2018-06-17 VITALS — BP 142/80 | HR 66 | Ht 68.6 in | Wt 172.8 lb

## 2018-06-17 DIAGNOSIS — I10 Essential (primary) hypertension: Secondary | ICD-10-CM | POA: Diagnosis not present

## 2018-06-17 DIAGNOSIS — F329 Major depressive disorder, single episode, unspecified: Secondary | ICD-10-CM | POA: Diagnosis not present

## 2018-06-17 DIAGNOSIS — I259 Chronic ischemic heart disease, unspecified: Secondary | ICD-10-CM | POA: Diagnosis not present

## 2018-06-17 DIAGNOSIS — F32A Depression, unspecified: Secondary | ICD-10-CM

## 2018-06-17 DIAGNOSIS — Z951 Presence of aortocoronary bypass graft: Secondary | ICD-10-CM

## 2018-06-17 NOTE — Progress Notes (Signed)
CARDIOLOGY OFFICE NOTE  Date:  06/17/2018    [name] Date of Birth: 24-Jul-1932 Medical Record #[id number]  PCP:  Rogers Blocker, MD  Cardiologist:  Servando Snare Nahser    Chief Complaint  Patient presents with  . Coronary Artery Disease    4 month check - seen for Dr. Acie Fredrickson    History of Present Illness: [name] is a 85 y.o. female who presents today for a 4 month check.  Seen for Dr. Acie Fredrickson. She primarily follows with me.   She has a history of DM, prior AVR and CABG x 2 per EBG back in 2014 - this was with a LIMA to the LAD and SVG to the LCX,with a pericardial Edwards tissue valve #21 mm. Did have post op atrial fib - was on amiodarone. Other issues include HLD, HTN, DM, depression and hypothyroidism. Myoview ok from 06/2013. Last echo 12/2014.   I saw her back in September of 2016 for a work in visit - she was short of breath. Labs looked ok. Blood count actually improved. Echo updated - did have elevated filling pressures but otherwise unchanged. Prosthetic AV ok. Normal EF and had grade 1 diastolic dysfunction. I wanted to put her on low dose Lasix but she did not wish to proceed due to having frequent urination when on Invokana.   Seen in November of 2016 by me and she was doing ok- not really clear as to why she had improved but she had. She had talked with Dr. Dwyane Dee and he also encouraged prn diuretic. She was agreeable to trying. Seen several times back since- BP still low and Plendil had been stopped altogether. Shestruggles  onlosing weight.Does have some chronic fatigue issues.She has been quite depressed over the past several times I have seen her - to the point that she is more inactive - I actually was able to get  her on low dose SSRI which helped her considerably.   Last seen by me in August and she was doing ok.    Comes in today.Here with her sister - they live together.She had a spell back in December - mixed up her diabetic  injections - ended up in the ER - but all turned out ok. She feels ok. No chest pain. Breathing is ok. Feels like she is doing ok. Trying to stay active. She is getting out. Tolerating her medicines. Weight is stable. Overall, no real concerns.   Past Medical History:  Diagnosis Date  . Aortic valve disorders    S/P AVR 06/2012  . Arthritis   . Asthma    AS CHILD   . Depression   . Diabetes mellitus   . Heart murmur   . Hyperlipidemia   . Hypertension   . Hypothyroidism   . Ischemic heart disease    with prior CABG x 2 in 2014 along with AVR  . Lumbago   . Shortness of breath   . Thyroid disease     Past Surgical History:  Procedure Laterality Date  . AORTIC VALVE REPLACEMENT N/A 06/25/2012   Procedure: AORTIC VALVE REPLACEMENT (AVR);  Surgeon: Grace Isaac, MD;  Location: [facility];  Service: Open Heart Surgery;  Laterality: N/A;  . BREAST BIOPSY Right 08/2016   benign  . BREAST SURGERY     LT BREAST BX BENIGN  . CORONARY ARTERY BYPASS GRAFT N/A 06/25/2012   Procedure: Coronary artery bypass graft  times two using left internal mammary artery  and right leg greater saphenous vein;  Surgeon: Grace Isaac, MD;  Location: [facility];  Service: Open Heart Surgery;  Laterality: N/A;  . INTRAOPERATIVE TRANSESOPHAGEAL ECHOCARDIOGRAM N/A 06/25/2012   Procedure: INTRAOPERATIVE TRANSESOPHAGEAL ECHOCARDIOGRAM;  Surgeon: Grace Isaac, MD;  Location: [facility];  Service: Open Heart Surgery;  Laterality: N/A;  . TUBAL LIGATION       Medications: Current Meds  Medication Sig  . aspirin EC 81 MG tablet Take 1 tablet (81 mg total) by mouth daily.  . B-D UF III MINI PEN NEEDLES 31G X 5 MM MISC USE 3 PER DAY  . benazepril (LOTENSIN) 20 MG tablet Take 20 mg by mouth daily.   . calcium carbonate (TUMS) 500 MG chewable tablet Chew 1 tablet by mouth daily as needed for heartburn (indigestion).  . Canagliflozin-metFORMIN HCl (INVOKAMET) 50-1000 MG TABS Take 1 tablet by mouth 2 (two) times daily.    . carvedilol (COREG) 12.5 MG tablet Take 1 tablet (12.5 mg total) by mouth 2 (two) times daily with a meal.  . carvedilol (COREG) 12.5 MG tablet TAKE 1 TABLET BY MOUTH TWICE A DAY  . cholecalciferol (VITAMIN D) 1000 UNITS tablet Take 1,000 Units by mouth daily.  . Coenzyme Q10 (CO Q-10) 100 MG CAPS Take 1 capsule by mouth 2 (two) times daily.  . ferrous sulfate 325 (65 FE) MG tablet Take 325 mg by mouth daily with breakfast.  . furosemide (LASIX) 20 MG tablet TAKE 1 TABLET (20 MG TOTAL) BY MOUTH DAILY AS NEEDED FOR FLUID OR EDEMA (OR WEIGHT GAIN).  Marland Kitchen glucose blood (ONETOUCH VERIO) test strip USE AS DIRECTED THREE TIMES DAILY.  Marland Kitchen HUMALOG KWIKPEN 100 UNIT/ML KiwkPen INJECT 6 TO 8 UNITS INTO THE SKIN TWICE DAILY  . levothyroxine (SYNTHROID, LEVOTHROID) 75 MCG tablet TAKE 1 TABLET BY MOUTH EVERY DAY  . metFORMIN (GLUCOPHAGE-XR) 750 MG 24 hr tablet TAKE 2 TABLETS BY MOUTH DAILY  . Multiple Vitamins-Minerals (MULTIVITAMINS THER. W/MINERALS) TABS Take 1 tablet by mouth daily.  Glory Rosebush VERIO test strip TEST THREE TIMES DAILY  . rosuvastatin (CRESTOR) 20 MG tablet TAKE 1 TABLET(20 MG) BY MOUTH DAILY  . TOUJEO SOLOSTAR 300 UNIT/ML SOPN INJECT 30 UNITS UNDER THE SKIN DAILY.     Allergies: No Known Allergies  Social History: The patient  reports that she has never smoked. She has never used smokeless tobacco. She reports that she does not drink alcohol or use drugs.   Family History: The patient's family history includes Heart disease in her father.   Review of Systems: Please see the history of present illness.   Otherwise, the review of systems is positive for none.   All other systems are reviewed and negative.   Physical Exam: VS:  BP (!) 142/80 (BP Location: Left Arm, Patient Position: Sitting, Cuff Size: Large)   Pulse 66   Ht 5' 8.6" (1.742 m)   Wt 172 lb 12.8 oz (78.4 kg)   SpO2 99%   BMI 25.82 kg/m  .  BMI Body mass index is 25.82 kg/m.  Wt Readings from Last 3 Encounters:   06/17/18 172 lb 12.8 oz (78.4 kg)  03/22/18 175 lb (79.4 kg)  02/05/18 172 lb (78 kg)    General: Pleasant. Elderly. Alert and in no acute distress.   HEENT: Normal.  Neck: Supple, no JVD, carotid bruits, or masses noted.  Cardiac: Regular rate and rhythm. Soft outflow murmur. No edema.  Respiratory:  Lungs are clear to auscultation bilaterally with normal work of  breathing.  GI: Soft and nontender.  MS: No deformity or atrophy. Gait and ROM intact.  Skin: Warm and dry. Color is normal.  Neuro:  Strength and sensation are intact and no gross focal deficits noted.  Psych: Alert, appropriate and with normal affect.   LABORATORY DATA:  EKG:  EKG is not ordered today.   Lab Results  Component Value Date   WBC 4.3 08/29/2017   HGB 10.7 (L) 08/29/2017   HCT 32.7 (L) 08/29/2017   PLT 215 08/29/2017   GLUCOSE 242 (H) 03/20/2018   CHOL 143 08/29/2017   TRIG 57 08/29/2017   HDL 56 08/29/2017   LDLDIRECT 102.8 11/28/2013   LDLCALC 76 08/29/2017   ALT 12 03/20/2018   AST 19 03/20/2018   NA 136 03/20/2018   K 4.0 03/20/2018   CL 99 03/20/2018   CREATININE 0.95 03/20/2018   BUN 17 03/20/2018   CO2 27 03/20/2018   TSH 3.33 03/20/2018   INR 1.57 (H) 06/25/2012   HGBA1C 7.5 (H) 03/20/2018   MICROALBUR 0.7 03/20/2018     BNP (last 3 results) No results for input(s): BNP in the last 8760 hours.  ProBNP (last 3 results) No results for input(s): PROBNP in the last 8760 hours.   Other Studies Reviewed Today:  EchoStudy Conclusions8/2018  - Left ventricle: The cavity size was normal. Systolic function was normal. The estimated ejection fraction was in the range of 55% to 60%. Wall motion was normal; there were no regional wall motion abnormalities. There was an increased relative contribution of atrial contraction to ventricular filling. Doppler parameters are consistent with abnormal left ventricular relaxation (grade 1 diastolic dysfunction). - Aortic  valve: A pericardial bioprosthesis was present and functioning normally. Mean gradient (S): 9 mm Hg. Peak gradient (S): 17 mm Hg. - Mitral valve: Calcified annulus. Mildly thickened leaflets . There was mild to moderate regurgitation. - Left atrium: The atrium was mildly dilated. - Atrial septum: There was increased thickness of the septum, consistent with lipomatous hypertrophy. - Pulmonary arteries: PA peak pressure: 42 mm Hg (S).  Impressions:  - The right ventricular systolic pressure was increased consistent with moderate pulmonary hypertension.   Myoview Impression from 06/2013 Exercise Capacity: Lexiscan with no exercise. BP Response: Normal blood pressure response. Clinical Symptoms: No significant symptoms noted. ECG Impression: There are scattered PVCs. Comparison with Prior Nuclear Study: No previous nuclear study performed  Overall Impression: Low risk stress nuclear study small fixed distal anterior artifact which could be breast attenuation or other artifact.  LV Ejection Fraction: 66%. LV Wall Motion: Normal Wall Motion  [name], MD, [facility]    Assessment/Plan:  1.CAD - remote CABG 2014 - stable Myoview from 2015 - she has no active symptoms - would continue with medical management. No changes made today.   2. Prior AVR for AS - last echo stable from August of 2018. No symptoms noted.   3. Depression -remains on low dose Lexapro - seems better.   4. HTN - BP is ok today - I have left her on her current regimen.   5. Obesity- activity encouraged.   6. Mild anemia - rechecking lab today.   Current medicines are reviewed with the patient today.  The patient does not have concerns regarding medicines other than what has been noted above.  The following changes have been made:  See above.  Labs/ tests ordered today include:    Orders Placed This Encounter  Procedures  . Basic metabolic panel  .  CBC  . Hepatic  function panel  . Lipid panel     Disposition:   FU with me in 4 months.   Patient is agreeable to this plan and will call if any problems develop in the interim.   SignedTruitt Merle, NP  06/17/2018 10:40 AM  [facility] [address] [place], [place]  [address] Phone: [phone] Fax: [phone]

## 2018-06-17 NOTE — Patient Instructions (Addendum)
We will be checking the following labs today - BMET, CBC, HPF and lipids  If you have labs (blood work) drawn today and your tests are completely normal, you will receive your results only by: Marland Kitchen MyChart Message (if you have MyChart) OR . A paper copy in the mail If you have any lab test that is abnormal or we need to change your treatment, we will call you to review the results.   Medication Instructions:    Continue with your current medicines.    If you need a refill on your cardiac medications before your next appointment, please call your pharmacy.     Testing/Procedures To Be Arranged:  N/A  Follow-Up:   See me in about 4 months.     At [facility], you and your health needs are our priority.  As part of our continuing mission to provide you with exceptional heart care, we have created designated Provider Care Teams.  These Care Teams include your primary Cardiologist (physician) and Advanced Practice Providers (APPs -  Physician Assistants and Nurse Practitioners) who all work together to provide you with the care you need, when you need it.  Special Instructions:  . None  Call the [facility] office at [phone] if you have any questions, problems or concerns.

## 2018-06-17 NOTE — Telephone Encounter (Signed)
PA for OneTouch verio test strips were denied.

## 2018-06-17 NOTE — Telephone Encounter (Signed)
PA initiated via [email or web address] for OneTouch Verio Test Strips. Key: [id number]  PA Case ID: [id number]  Rx #: [id number]

## 2018-06-18 LAB — LIPID PANEL
Chol/HDL Ratio: 2.8 ratio (ref 0.0–4.4)
Cholesterol, Total: 147 mg/dL (ref 100–199)
HDL: 52 mg/dL (ref >39)
LDL Calculated: 75 mg/dL (ref 0–99)
Triglycerides: 101 mg/dL (ref 0–149)
VLDL Cholesterol Cal: 20 mg/dL (ref 5–40)

## 2018-06-18 LAB — CBC
Hematocrit: 31.8 % — ABNORMAL LOW (ref 34.0–46.6)
Hemoglobin: 10.2 g/dL — ABNORMAL LOW (ref 11.1–15.9)
MCH: 31 pg (ref 26.6–33.0)
MCHC: 32.1 g/dL (ref 31.5–35.7)
MCV: 97 fL (ref 79–97)
Platelets: 197 10*3/uL (ref 150–450)
RBC: 3.29 x10E6/uL — ABNORMAL LOW (ref 3.77–5.28)
RDW: 12.8 % (ref 11.7–15.4)
WBC: 4.9 10*3/uL (ref 3.4–10.8)

## 2018-06-18 LAB — BASIC METABOLIC PANEL
BUN/Creatinine Ratio: 19 (ref 12–28)
BUN: 20 mg/dL (ref 8–27)
CO2: 23 mmol/L (ref 20–29)
Calcium: 9.5 mg/dL (ref 8.7–10.3)
Chloride: 100 mmol/L (ref 96–106)
Creatinine, Ser: 1.07 mg/dL — ABNORMAL HIGH (ref 0.57–1.00)
GFR calc Af Amer: 55 mL/min/{1.73_m2} — ABNORMAL LOW (ref >59)
GFR calc non Af Amer: 48 mL/min/{1.73_m2} — ABNORMAL LOW (ref >59)
Glucose: 262 mg/dL — ABNORMAL HIGH (ref 65–99)
Potassium: 4.4 mmol/L (ref 3.5–5.2)
Sodium: 138 mmol/L (ref 134–144)

## 2018-06-18 LAB — HEPATIC FUNCTION PANEL
ALT: 9 IU/L (ref 0–32)
AST: 16 IU/L (ref 0–40)
Albumin: 4.1 g/dL (ref 3.6–4.6)
Alkaline Phosphatase: 50 IU/L (ref 39–117)
Bilirubin Total: 0.3 mg/dL (ref 0.0–1.2)
Bilirubin, Direct: 0.11 mg/dL (ref 0.00–0.40)
Total Protein: 7.3 g/dL (ref 6.0–8.5)

## 2018-06-20 ENCOUNTER — Other Ambulatory Visit: Payer: Medicare Other

## 2018-06-23 ENCOUNTER — Other Ambulatory Visit: Payer: Self-pay | Admitting: Nurse Practitioner

## 2018-06-24 ENCOUNTER — Other Ambulatory Visit (INDEPENDENT_AMBULATORY_CARE_PROVIDER_SITE_OTHER): Payer: Medicare Other

## 2018-06-24 DIAGNOSIS — Z794 Long term (current) use of insulin: Secondary | ICD-10-CM | POA: Diagnosis not present

## 2018-06-24 DIAGNOSIS — E1165 Type 2 diabetes mellitus with hyperglycemia: Secondary | ICD-10-CM

## 2018-06-24 LAB — COMPREHENSIVE METABOLIC PANEL
ALT: 11 U/L (ref 0–35)
AST: 20 U/L (ref 0–37)
Albumin: 4.3 g/dL (ref 3.5–5.2)
Alkaline Phosphatase: 46 U/L (ref 39–117)
BUN: 21 mg/dL (ref 6–23)
CO2: 30 meq/L (ref 19–32)
Calcium: 10 mg/dL (ref 8.4–10.5)
Chloride: 99 mEq/L (ref 96–112)
Creatinine, Ser: 1 mg/dL (ref 0.40–1.20)
GFR: 64.03 mL/min (ref >60.00)
Glucose, Bld: 94 mg/dL (ref 70–99)
Potassium: 4 mEq/L (ref 3.5–5.1)
Sodium: 137 mEq/L (ref 135–145)
Total Bilirubin: 0.5 mg/dL (ref 0.2–1.2)
Total Protein: 7.9 g/dL (ref 6.0–8.3)

## 2018-06-24 LAB — HEMOGLOBIN A1C: Hgb A1c MFr Bld: 8.2 % — ABNORMAL HIGH (ref 4.6–6.5)

## 2018-06-25 ENCOUNTER — Ambulatory Visit: Payer: Medicare Other | Admitting: Endocrinology

## 2018-06-26 ENCOUNTER — Ambulatory Visit: Payer: Medicare Other | Admitting: Endocrinology

## 2018-06-29 ENCOUNTER — Other Ambulatory Visit: Payer: Self-pay | Admitting: Endocrinology

## 2018-07-02 ENCOUNTER — Other Ambulatory Visit: Payer: Self-pay

## 2018-07-02 ENCOUNTER — Other Ambulatory Visit: Payer: Self-pay | Admitting: Nurse Practitioner

## 2018-07-02 MED ORDER — FUROSEMIDE 20 MG PO TABS: 20.0000 mg | ORAL_TABLET | Freq: Every day | ORAL | 3 refills | Status: DC | PRN

## 2018-07-04 ENCOUNTER — Ambulatory Visit (INDEPENDENT_AMBULATORY_CARE_PROVIDER_SITE_OTHER): Payer: Medicare Other | Admitting: Endocrinology

## 2018-07-04 ENCOUNTER — Encounter: Payer: Self-pay | Admitting: Endocrinology

## 2018-07-04 ENCOUNTER — Other Ambulatory Visit: Payer: Self-pay

## 2018-07-04 ENCOUNTER — Ambulatory Visit: Payer: Medicare Other | Admitting: Endocrinology

## 2018-07-04 VITALS — BP 142/76 | HR 77 | Ht 68.6 in | Wt 176.0 lb

## 2018-07-04 DIAGNOSIS — E1165 Type 2 diabetes mellitus with hyperglycemia: Secondary | ICD-10-CM | POA: Diagnosis not present

## 2018-07-04 DIAGNOSIS — E063 Autoimmune thyroiditis: Secondary | ICD-10-CM | POA: Diagnosis not present

## 2018-07-04 DIAGNOSIS — Z794 Long term (current) use of insulin: Secondary | ICD-10-CM | POA: Diagnosis not present

## 2018-07-04 DIAGNOSIS — I10 Essential (primary) hypertension: Secondary | ICD-10-CM

## 2018-07-04 DIAGNOSIS — I259 Chronic ischemic heart disease, unspecified: Secondary | ICD-10-CM | POA: Diagnosis not present

## 2018-07-04 MED ORDER — CANAGLIFLOZIN-METFORMIN HCL 50-1000 MG PO TABS: 1.0000 | ORAL_TABLET | Freq: Two times a day (BID) | ORAL | 1 refills | Status: DC

## 2018-07-04 NOTE — Patient Instructions (Signed)
Check blood sugars on waking up 3-4 days a week  Also check blood sugars about 2 hours after meals and do this after different meals by rotation  Recommended blood sugar levels on waking up are 90-130 and about 2 hours after meal is 130-180  Please bring your blood sugar monitor to each visit, thank you  Take Invokamet 1/2 in am and metformin in pm

## 2018-07-04 NOTE — Progress Notes (Signed)
[name] 85 y.o.            Reason for Appointment: Diabetes follow-up   History of Present Illness   Diagnosis: Type 2 DIABETES MELITUS, date of diagnosis:   2000   She has been on insulin since 2004 with difficulty controlling adequately because of compliance with diet and variability in blood sugars. Her previous records are not available at present, however she has been on basal insulin with mealtime coverage since at least 2007 She has been tried on Byetta previously but did not tolerate this well She tried Victoza but didn't not benefit from Victoza 0.6 dosage; had nausea from 1.2 mg; did not continue this because of this side effect She also was given a trial of the V-go pump but she subjectively did not like this  RECENT history:    INSULIN: Toujeo 22 units in morning, HUMALOG 6 units Acb, 6 lunch and 4 at supper    Oral hypoglycemic drugs: Metformin er 750 mg  Was on Invokamet 50/1000, half tablet at supper,    A1c is usually around 8%, slightly higher now at 8.2 compared to 7.5   She has the following blood sugar patterns and problems identified  She stopped taking her Invokamet because of fear of side effects  Despite asking her to improve her diet she is still eating fried food including fried fish consistently  As before she does not take her insulin when she goes out to eat lunch  Recently has not checked readings much except in the morning  Appears to have progressively higher blood sugars between breakfast and evening  She takes her Humalog late in the afternoon blood sugars may improve and occasionally as low as 88  She has blood sugars as high as 394 in the early evenings  FASTING blood sugars are higher than before when they were averaging 112  Not able to do much physical activity  Her weight is up only about 1 pound      .         Monitors blood glucose:  about 2-3 times daily     Glucometer: One Touch Verio.          Blood Glucose  readings from download:    PRE-MEAL Fasting Lunch Dinner Bedtime Overall  Glucose range:  105-234   88-384    Mean/median: 137     172+/-87   POST-MEAL PC Breakfast PC Lunch PC Dinner  Glucose range:   194-346  144-394  Mean/median:   298    Previous readings:  PRE-MEAL Fasting Lunch Dinner Bedtime Overall  Glucose range:  77-178   94-268  43-277  Mean/median:  112     131+/-54   POST-MEAL PC Breakfast PC Lunch PC Dinner  Glucose range:   107-277   Mean/median:   196  199    Meals: 3 meals per day:  breakfast is At 7 am;  lunch 12-2 pm, dinner 5 pm Intake is quite variable at different meals, usually lighter supper    Wt Readings from Last 3 Encounters:  07/04/18 176 lb (79.8 kg)  06/17/18 172 lb 12.8 oz (78.4 kg)  03/22/18 175 lb (79.4 kg)    Lab Results  Component Value Date   HGBA1C 8.2 (H) 06/24/2018   HGBA1C 7.5 (H) 03/20/2018   HGBA1C 7.7 (H) 12/17/2017   Lab Results  Component Value Date   MICROALBUR 0.7 03/20/2018   LDLCALC 75 06/17/2018   CREATININE 1.00 06/24/2018  No visits with results within 1 Week(s) from this visit.  Latest known visit with results is:  Lab on 06/24/2018  Component Date Value Ref Range Status  . Sodium 06/24/2018 137  135 - 145 mEq/L Final  . Potassium 06/24/2018 4.0  3.5 - 5.1 mEq/L Final  . Chloride 06/24/2018 99  96 - 112 mEq/L Final  . CO2 06/24/2018 30  19 - 32 mEq/L Final  . Glucose, Bld 06/24/2018 94  70 - 99 mg/dL Final  . BUN 06/24/2018 21  6 - 23 mg/dL Final  . Creatinine, Ser 06/24/2018 1.00  0.40 - 1.20 mg/dL Final  . Total Bilirubin 06/24/2018 0.5  0.2 - 1.2 mg/dL Final  . Alkaline Phosphatase 06/24/2018 46  39 - 117 U/L Final  . AST 06/24/2018 20  0 - 37 U/L Final  . ALT 06/24/2018 11  0 - 35 U/L Final  . Total Protein 06/24/2018 7.9  6.0 - 8.3 g/dL Final  . Albumin 06/24/2018 4.3  3.5 - 5.2 g/dL Final  . Calcium 06/24/2018 10.0  8.4 - 10.5 mg/dL Final  . GFR 06/24/2018 64.03  >60.00 mL/min Final  . Hgb  A1c MFr Bld 06/24/2018 8.2* 4.6 - 6.5 % Final   Glycemic Control Guidelines for People with Diabetes:Non Diabetic:  <6%Goal of Therapy: <7%Additional Action Suggested:  >8%      Allergies as of 07/04/2018   No Known Allergies     Medication List       Accurate as of July 04, 2018 11:12 AM. Always use your most recent med list.        aspirin EC 81 MG tablet Take 1 tablet (81 mg total) by mouth daily.   B-D UF III MINI PEN NEEDLES 31G X 5 MM Misc Generic drug:  Insulin Pen Needle USE 3 PER DAY   benazepril 20 MG tablet Commonly known as:  LOTENSIN Take 20 mg by mouth daily.   carvedilol 12.5 MG tablet Commonly known as:  COREG Take 1 tablet (12.5 mg total) by mouth 2 (two) times daily with a meal.   carvedilol 12.5 MG tablet Commonly known as:  COREG TAKE 1 TABLET BY MOUTH TWICE A DAY   cholecalciferol 1000 units tablet Commonly known as:  VITAMIN D Take 1,000 Units by mouth daily.   Co Q-10 100 MG Caps Take 1 capsule by mouth 2 (two) times daily.   ferrous sulfate 325 (65 FE) MG tablet Take 325 mg by mouth daily with breakfast.   furosemide 20 MG tablet Commonly known as:  LASIX Take 1 tablet (20 mg total) by mouth daily as needed for fluid or edema (or weight gain).   HUMALOG KWIKPEN 100 UNIT/ML KwikPen Generic drug:  insulin lispro INJECT 6 TO 8 UNITS INTO THE SKIN TWICE DAILY   levothyroxine 75 MCG tablet Commonly known as:  SYNTHROID, LEVOTHROID TAKE 1 TABLET BY MOUTH EVERY DAY   metFORMIN 750 MG 24 hr tablet Commonly known as:  GLUCOPHAGE-XR TAKE 2 TABLETS BY MOUTH DAILY   multivitamins ther. w/minerals Tabs tablet Take 1 tablet by mouth daily.   ONETOUCH VERIO test strip Generic drug:  glucose blood TEST THREE TIMES DAILY   glucose blood test strip Commonly known as:  ONETOUCH VERIO USE AS DIRECTED THREE TIMES DAILY.   rosuvastatin 20 MG tablet Commonly known as:  CRESTOR TAKE 1 TABLET(20 MG) BY MOUTH DAILY   TOUJEO SOLOSTAR 300 UNIT/ML  Sopn Generic drug:  Insulin Glargine (1 Unit Dial) INJECT 30 UNITS UNDER  THE SKIN DAILY.   TUMS 500 MG chewable tablet Generic drug:  calcium carbonate Chew 1 tablet by mouth daily as needed for heartburn (indigestion).       Allergies: No Known Allergies  Past Medical History:  Diagnosis Date  . Aortic valve disorders    S/P AVR 06/2012  . Arthritis   . Asthma    AS CHILD   . Depression   . Diabetes mellitus   . Heart murmur   . Hyperlipidemia   . Hypertension   . Hypothyroidism   . Ischemic heart disease    with prior CABG x 2 in 2014 along with AVR  . Lumbago   . Shortness of breath   . Thyroid disease     Past Surgical History:  Procedure Laterality Date  . AORTIC VALVE REPLACEMENT N/A 06/25/2012   Procedure: AORTIC VALVE REPLACEMENT (AVR);  Surgeon: Grace Isaac, MD;  Location: [facility];  Service: Open Heart Surgery;  Laterality: N/A;  . BREAST BIOPSY Right 08/2016   benign  . BREAST SURGERY     LT BREAST BX BENIGN  . CORONARY ARTERY BYPASS GRAFT N/A 06/25/2012   Procedure: Coronary artery bypass graft  times two using left internal mammary artery and right leg greater saphenous vein;  Surgeon: Grace Isaac, MD;  Location: [facility];  Service: Open Heart Surgery;  Laterality: N/A;  . INTRAOPERATIVE TRANSESOPHAGEAL ECHOCARDIOGRAM N/A 06/25/2012   Procedure: INTRAOPERATIVE TRANSESOPHAGEAL ECHOCARDIOGRAM;  Surgeon: Grace Isaac, MD;  Location: [facility];  Service: Open Heart Surgery;  Laterality: N/A;  . TUBAL LIGATION      Family History  Problem Relation Age of Onset  . Heart disease Father     Social History:  reports that she has never smoked. She has never used smokeless tobacco. She reports that she does not drink alcohol or use drugs.  Review of Systems:   HYPERTENSION: Blood pressure is usually followed by PCP Treatment includes benazepril Recent blood pressure readings as follows  BP Readings from Last 3 Encounters:  07/04/18 (!) 142/76    06/17/18 (!) 142/80  04/19/18 (!) 181/84     HYPOTHYROIDISM: She has had long-standing hypothyroidism  She is compliant with her levothyroxine 75 g dose No fatigue recently  TSH has been consistent in the normal range   Lab Results  Component Value Date   TSH 3.33 03/20/2018   TSH 2.330 08/29/2017   TSH 0.92 01/22/2017   FREET4 0.92 03/20/2018   FREET4 1.01 09/11/2017   FREET4 1.13 06/02/2015    HYPERLIPIDEMIA: The lipid abnormality consists of elevated LDL; has been  controlled with Crestor 20 mg LDL is below   100  Lab Results  Component Value Date   CHOL 147 06/17/2018   HDL 52 06/17/2018   LDLCALC 75 06/17/2018   LDLDIRECT 102.8 11/28/2013   TRIG 101 06/17/2018   CHOLHDL 2.8 06/17/2018  '    She does see a podiatrist regularly for onychomycosis  Last foot exam Here was in 1/19: Diabetic foot exam shows normal monofilament sensation in the toes and plantar surfaces, no skin lesions or ulcers on the feet and normal pedal pulses    Examination:   BP (!) 142/76 (BP Location: Left Arm, Patient Position: Sitting, Cuff Size: Normal)   Pulse 77   Ht 5' 8.6" (1.742 m)   Wt 176 lb (79.8 kg)   SpO2 99%   BMI 26.29 kg/m   Body mass index is 26.29 kg/m.    ASSESSMENT/ PLAN:  Diabetes type 2, uncontrolled   See history of present illness for a discussion of the blood sugar patterns, recent management and problems identified  A1c is 8.2  Blood sugars have been poorly controlled after meals because of lack of her taking Humalog on time with her main meals especially at lunchtime Also diet is overall still poor with eating out frequently and eating fried food She is not very active and does not lose weight Blood sugars appear to be higher with her not taking Invokamet which she is afraid of taking, even fasting blood sugars are higher  Discussed with the patient that she continues to have the same problem with not taking insulin when she is eating lunch and not  taking enough likely especially if eating higher glycemic index or high fat foods  She is a good candidate for the V-go pump and she was shown how this would work for her but she is refusing to consider this Discussed benefits of Invokamet and since she had taken this without problems previously reassured her that this does not have any serious side effects, given her brochure on long-term benefits now indicated  In the meantime she thinks she can start taking her insulin at lunch consistently She will also go up to 8 units at lunch if eating higher fat meal Sample of Humalog pen was given See below taken recommend in the morning as before and also continue 750 metformin ER  HYPERTENSION: Well-controlled and she is on an ACE inhibitor Blood pressure high normal May benefit from adding Invokana also  Hypothyroidism: Treated with 75 mcg with last TSH normal    Counseling time on subjects discussed in assessment and plan sections is over 50% of today's 25 minute visit   There are no Patient Instructions on file for this visit.      Elayne Snare 07/04/2018, 11:12 AM   Note: This office note was prepared with Dragon voice recognition system technology. Any transcriptional errors that result from this process are unintentional.

## 2018-08-14 ENCOUNTER — Encounter: Payer: Self-pay | Admitting: Podiatry

## 2018-08-14 ENCOUNTER — Other Ambulatory Visit: Payer: Self-pay

## 2018-08-14 ENCOUNTER — Ambulatory Visit (INDEPENDENT_AMBULATORY_CARE_PROVIDER_SITE_OTHER): Payer: Medicare Other | Admitting: Podiatry

## 2018-08-14 VITALS — Temp 95.2°F

## 2018-08-14 DIAGNOSIS — M2041 Other hammer toe(s) (acquired), right foot: Secondary | ICD-10-CM

## 2018-08-14 DIAGNOSIS — E1165 Type 2 diabetes mellitus with hyperglycemia: Secondary | ICD-10-CM | POA: Diagnosis not present

## 2018-08-14 DIAGNOSIS — B351 Tinea unguium: Secondary | ICD-10-CM | POA: Diagnosis not present

## 2018-08-14 DIAGNOSIS — M79676 Pain in unspecified toe(s): Secondary | ICD-10-CM

## 2018-08-14 DIAGNOSIS — L84 Corns and callosities: Secondary | ICD-10-CM

## 2018-08-14 DIAGNOSIS — IMO0001 Reserved for inherently not codable concepts without codable children: Secondary | ICD-10-CM

## 2018-08-15 ENCOUNTER — Encounter: Payer: Self-pay | Admitting: Podiatry

## 2018-08-15 NOTE — Progress Notes (Signed)
Subjective: [name] is a 85 y.o. y.o. female who presents today for preventative diabetic foot care. She relates painful lesion dorsal aspect right 2nd digit. This is a chronic problem for her due to rigid hammertoe deformity.  On her last visit, Dr. Prudence Davidson instructed her to schedule appointment with Dr. Cannon Kettle for surgical consult regarding rigid hammertoe right 2nd digit. Today, [name] states she is hesitant to have hammertoe corrected due to fear of post-surgical complications because she is diabetic.  She denies any history of wounds.    Rogers Blocker, MD is her PCP.   Dr. Dwyane Dee manages her diabetes. Last visit was 07/04/2018.   Current Outpatient Medications:  .  aspirin EC 81 MG tablet, Take 1 tablet (81 mg total) by mouth daily., Disp: , Rfl:  .  B-D UF III MINI PEN NEEDLES 31G X 5 MM MISC, USE 3 PER DAY, Disp: 100 each, Rfl: 2 .  benazepril (LOTENSIN) 20 MG tablet, Take 20 mg by mouth daily. , Disp: , Rfl:  .  calcium carbonate (TUMS) 500 MG chewable tablet, Chew 1 tablet by mouth daily as needed for heartburn (indigestion)., Disp: , Rfl:  .  Canagliflozin-metFORMIN HCl (INVOKAMET) 50-1000 MG TABS, Take 1 tablet by mouth 2 (two) times daily. Take as directed, Disp: 60 tablet, Rfl: 1 .  carvedilol (COREG) 12.5 MG tablet, Take 1 tablet (12.5 mg total) by mouth 2 (two) times daily with a meal., Disp: 60 tablet, Rfl: 0 .  carvedilol (COREG) 12.5 MG tablet, TAKE 1 TABLET BY MOUTH TWICE A DAY, Disp: 60 tablet, Rfl: 10 .  cholecalciferol (VITAMIN D) 1000 UNITS tablet, Take 1,000 Units by mouth daily., Disp: , Rfl:  .  Coenzyme Q10 (CO Q-10) 100 MG CAPS, Take 1 capsule by mouth 2 (two) times daily., Disp: , Rfl:  .  ferrous sulfate 325 (65 FE) MG tablet, Take 325 mg by mouth daily with breakfast., Disp: , Rfl:  .  furosemide (LASIX) 20 MG tablet, Take 1 tablet (20 mg total) by mouth daily as needed for fluid or edema (or weight gain)., Disp: 90 tablet, Rfl: 3 .  glucose blood  (ONETOUCH VERIO) test strip, USE AS DIRECTED THREE TIMES DAILY., Disp: 200 each, Rfl: 5 .  HUMALOG KWIKPEN 100 UNIT/ML KiwkPen, INJECT 6 TO 8 UNITS INTO THE SKIN TWICE DAILY, Disp: 45 mL, Rfl: 0 .  levothyroxine (SYNTHROID, LEVOTHROID) 75 MCG tablet, TAKE 1 TABLET BY MOUTH EVERY DAY, Disp: 90 tablet, Rfl: 1 .  metFORMIN (GLUCOPHAGE-XR) 750 MG 24 hr tablet, TAKE 2 TABLETS BY MOUTH DAILY (Patient taking differently: No sig reported), Disp: 180 tablet, Rfl: 0 .  Multiple Vitamins-Minerals (MULTIVITAMINS THER. W/MINERALS) TABS, Take 1 tablet by mouth daily., Disp: , Rfl:  .  ONETOUCH VERIO test strip, TEST THREE TIMES DAILY, Disp: 200 each, Rfl: 4 .  rosuvastatin (CRESTOR) 20 MG tablet, TAKE 1 TABLET(20 MG) BY MOUTH DAILY, Disp: 90 tablet, Rfl: 2 .  TOUJEO SOLOSTAR 300 UNIT/ML SOPN, INJECT 30 UNITS UNDER THE SKIN DAILY. (Patient taking differently: No sig reported), Disp: 5 pen, Rfl: 4  No Known Allergies  Objective:  Vascular Examination: Capillary refill time immediate x 10 digits.  Dorsalis pedis pulses palpable b/l.  Posterior tibial pulses decreased b/l.  Digital hair absent x 10 digits.  Skin temperature gradient WNL b/l.  Dermatological Examination: Skin with normal turgor, texture and tone b/l.  Toenails 1-5 b/l discolored, thick, dystrophic with subungual debris and pain with palpation to nailbeds due to  thickness of nails.  Hyperkeratotic lesions noted dorsal 2nd PIPJ right foot, dorsal 5th digit PIPJ b/l submetatarsal head 2 b/l, submetatarsal head 3 right foot.  No erythema, no edema, no drainage, no flocculence noted.   Musculoskeletal: Muscle strength 5/5 to all LE muscle groups b/l.  HAV with bunion b/l contributing to rigid hammertoe deformity right 2nd digit.  Rigid, non-reducible hammertoe noted right 2nd digit with symptomatic lesion which is tender to palpation. No erythema, no edema, no drainage, no flocculence.   Shoe evaluation: she is wearing lace-up leather  boat shoes with low toebox which does not accommodate her hammertoe deformity. This is contributing to her chronic, painful corn formation on the 2nd digit right foot.  Neurological: Sensation 4/5 right and 5/5 left with 10 gram monofilament.  Vibratory sensation intact b/l.  Assessment: 1.  Painful onychomycosis toenails 1-5 b/l 2.  Callus submet head 2 b/l, submet head 3 right foot 3.  Corn dorsal PIPJ b/l 5th digit and right 2nd digit 4.  Hammertoes b/l 5th digit, right 2nd digit 4.  NIDDM, uncontrolled, followed by Dr. Dwyane Dee  Plan: 1. Continue diabetic foot care principles daily.  2. Toenails 1-5 b/l were debrided in length and girth without iatrogenic bleeding. 3. Hyperkeratotic lesion(s) pared submet head 2nd b/l, submet head 3 right foot, b/l 5th digit PIPJ and right 2nd digit PIPJ with sterile scalpel blade without incident. 62. Educated [name] about her ill-fitting shoe gear on today. Advised her to avoid leather shoe gear which rubs the tops of her toes leading to corn formation. Recommended shoes with soft, stretchable uppers (mesh or lycra) which do not rub hammertoes. 5. Patient to continue soft, supportive shoe gear daily. 6. Patient to report any pedal injuries to medical professional immediately. 7. Follow up 3 months.  8. Patient/POA to call should there be a concern in the interim.

## 2018-08-19 ENCOUNTER — Other Ambulatory Visit: Payer: Self-pay

## 2018-08-19 ENCOUNTER — Other Ambulatory Visit (INDEPENDENT_AMBULATORY_CARE_PROVIDER_SITE_OTHER): Payer: Medicare Other

## 2018-08-19 DIAGNOSIS — E063 Autoimmune thyroiditis: Secondary | ICD-10-CM | POA: Diagnosis not present

## 2018-08-19 DIAGNOSIS — E1165 Type 2 diabetes mellitus with hyperglycemia: Secondary | ICD-10-CM | POA: Diagnosis not present

## 2018-08-19 DIAGNOSIS — Z794 Long term (current) use of insulin: Secondary | ICD-10-CM | POA: Diagnosis not present

## 2018-08-19 LAB — BASIC METABOLIC PANEL
BUN: 19 mg/dL (ref 6–23)
CO2: 28 mEq/L (ref 19–32)
Calcium: 10 mg/dL (ref 8.4–10.5)
Chloride: 101 mEq/L (ref 96–112)
Creatinine, Ser: 0.99 mg/dL (ref 0.40–1.20)
GFR: 64.75 mL/min (ref >60.00)
Glucose, Bld: 103 mg/dL — ABNORMAL HIGH (ref 70–99)
Potassium: 3.8 mEq/L (ref 3.5–5.1)
Sodium: 138 mEq/L (ref 135–145)

## 2018-08-19 LAB — T4, FREE: Free T4: 1.02 ng/dL (ref 0.60–1.60)

## 2018-08-19 LAB — TSH: TSH: 0.96 u[IU]/mL (ref 0.35–4.50)

## 2018-08-20 LAB — FRUCTOSAMINE: Fructosamine: 326 umol/L — ABNORMAL HIGH (ref 0–285)

## 2018-08-21 ENCOUNTER — Other Ambulatory Visit: Payer: Self-pay

## 2018-08-21 ENCOUNTER — Telehealth: Payer: Self-pay | Admitting: Endocrinology

## 2018-08-21 ENCOUNTER — Encounter: Payer: Self-pay | Admitting: Endocrinology

## 2018-08-21 ENCOUNTER — Encounter: Payer: Medicare Other | Admitting: Endocrinology

## 2018-08-21 NOTE — Progress Notes (Signed)
This encounter was created in error - please disregard.

## 2018-08-21 NOTE — Telephone Encounter (Signed)
Patient arrived for her August 23, 2018 visit without her meter.  Clinical staff checked with Doctor regarding seeing patient without the meter readings. Doctor declined to see and advised to reschedule at a time when patient could bring meter to visit.  Attempt made to let patient know and to reschedule visit.  Patient stated she would just call back and walked out.

## 2018-08-21 NOTE — Telephone Encounter (Signed)
Patient called and would like a call back at [phone]

## 2018-08-21 NOTE — Telephone Encounter (Signed)
Pt was called back and she began hollering about when she was in the office this morning that I personally did not tell her that Dr. Dwyane Dee wanted to reschedule her appt. I had the receptionist inform the patient because at the time Dr. Dwyane Dee informed me of this, I was on the phone with another patient.

## 2018-08-27 ENCOUNTER — Encounter: Payer: Self-pay | Admitting: Endocrinology

## 2018-08-27 ENCOUNTER — Other Ambulatory Visit: Payer: Self-pay

## 2018-08-27 ENCOUNTER — Ambulatory Visit (INDEPENDENT_AMBULATORY_CARE_PROVIDER_SITE_OTHER): Payer: Medicare Other | Admitting: Endocrinology

## 2018-08-27 VITALS — BP 150/80 | HR 79 | Ht 68.6 in | Wt 173.4 lb

## 2018-08-27 DIAGNOSIS — E1165 Type 2 diabetes mellitus with hyperglycemia: Secondary | ICD-10-CM | POA: Diagnosis not present

## 2018-08-27 DIAGNOSIS — I259 Chronic ischemic heart disease, unspecified: Secondary | ICD-10-CM | POA: Diagnosis not present

## 2018-08-27 DIAGNOSIS — Z794 Long term (current) use of insulin: Secondary | ICD-10-CM

## 2018-08-27 NOTE — Progress Notes (Signed)
[name] 85 y.o.            Reason for Appointment: Endocrinology follow-up   History of Present Illness   Diagnosis: Type 2 DIABETES MELITUS, date of diagnosis:   2000   She has been on insulin since 2004 with difficulty controlling adequately because of compliance with diet and variability in blood sugars. Her previous records are not available at present, however she has been on basal insulin with mealtime coverage since at least 2007 She has been tried on Byetta previously but did not tolerate this well She tried Victoza but didn't not benefit from Victoza 0.6 dosage; had nausea from 1.2 mg; did not continue this because of this side effect She also was given a trial of the V-go pump but she subjectively did not like this  RECENT history:    INSULIN: Toujeo 20/22 units in morning, HUMALOG 6 units Acb, 6 lunch and 4 at supper    Oral hypoglycemic drugs: Metformin er 750 mg pm and Invokamet 50/1000, half tablet at variable times   A1c is usually around 8%, slightly higher last at 8.2 compared to 7.5 Fructosamine is 326  She has the following blood sugar patterns and problems identified  She restarted taking her Invokamet as directed on the last visit but only taking a half a tablet because of fear of side effects  She however is taking it either midmorning or midafternoon  She thinks it may be causing some itching but this is tolerable  Blood sugars are significantly better  Also fluctuation is backslash including in the mornings  However her blood sugars are being monitored mostly at breakfast and dinnertime and not after meals usually  Although she does have a meal in the evening she does not always cover this with insulin, likely afraid to take any especially if her blood sugar before dinner is normal  Last month had a relatively low sugar of 61 after lunch possibly because of a low carbohydrate meal  Her FASTING readings are excellent in the last 3 days  have been below 100  No overnight hypoglycemia      .         Monitors blood glucose:  about 2-3 times daily     Glucometer: One Touch Verio.          Blood Glucose readings from download:    PRE-MEAL Fasting Lunch Dinner Bedtime Overall  Glucose range: 84-162   123-386   61-386  Mean/median:  124  237  172   143   POST-MEAL PC Breakfast PC Lunch PC Dinner  Glucose range:    80-320  Mean/median:   163    Previous readings:  PRE-MEAL Fasting Lunch Dinner Bedtime Overall  Glucose range:  105-234   88-384    Mean/median: 137     172+/-87   POST-MEAL PC Breakfast PC Lunch PC Dinner  Glucose range:   194-346  144-394  Mean/median:   298     Meals: 3 meals per day:  breakfast is At 7 am;  lunch 12-2 pm, dinner 5 pm Intake is quite variable at different meals, usually lighter supper    Wt Readings from Last 3 Encounters:  08/27/18 173 lb 6.4 oz (78.7 kg)  07/04/18 176 lb (79.8 kg)  06/17/18 172 lb 12.8 oz (78.4 kg)    Lab Results  Component Value Date   HGBA1C 8.2 (H) 06/24/2018   HGBA1C 7.5 (H) 03/20/2018   HGBA1C 7.7 (H) 12/17/2017  Lab Results  Component Value Date   MICROALBUR 0.7 03/20/2018   LDLCALC 75 06/17/2018   CREATININE 0.99 08/19/2018    No visits with results within 1 Week(s) from this visit.  Latest known visit with results is:  Lab on 08/19/2018  Component Date Value Ref Range Status  . Free T4 08/19/2018 1.02  0.60 - 1.60 ng/dL Final   Comment: Specimens from patients who are undergoing biotin therapy and /or ingesting biotin supplements may contain high levels of biotin.  The higher biotin concentration in these specimens interferes with this Free T4 assay.  Specimens that contain high levels  of biotin may cause false high results for this Free T4 assay.  Please interpret results in light of the total clinical presentation of the patient.    Marland Kitchen TSH 08/19/2018 0.96  0.35 - 4.50 uIU/mL Final  . Fructosamine 08/19/2018 326* 0 - 285 umol/L Final    Comment: Published reference interval for apparently healthy subjects between age 18 and 36 is 29 - 285 umol/L and in a poorly controlled diabetic population is 228 - 563 umol/L with a mean of 396 umol/L.   Marland Kitchen Sodium 08/19/2018 138  135 - 145 mEq/L Final  . Potassium 08/19/2018 3.8  3.5 - 5.1 mEq/L Final  . Chloride 08/19/2018 101  96 - 112 mEq/L Final  . CO2 08/19/2018 28  19 - 32 mEq/L Final  . Glucose, Bld 08/19/2018 103* 70 - 99 mg/dL Final  . BUN 08/19/2018 19  6 - 23 mg/dL Final  . Creatinine, Ser 08/19/2018 0.99  0.40 - 1.20 mg/dL Final  . Calcium 08/19/2018 10.0  8.4 - 10.5 mg/dL Final  . GFR 08/19/2018 64.75  >60.00 mL/min Final     Allergies as of 08/27/2018   No Known Allergies     Medication List       Accurate as of August 27, 2018  9:27 AM. Always use your most recent med list.        aspirin EC 81 MG tablet Take 1 tablet (81 mg total) by mouth daily.   B-D UF III MINI PEN NEEDLES 31G X 5 MM Misc Generic drug:  Insulin Pen Needle USE 3 PER DAY   benazepril 20 MG tablet Commonly known as:  LOTENSIN Take 20 mg by mouth daily.   Canagliflozin-metFORMIN HCl 50-1000 MG Tabs Commonly known as:  Invokamet Take 1 tablet by mouth 2 (two) times daily. Take as directed   carvedilol 12.5 MG tablet Commonly known as:  Coreg Take 1 tablet (12.5 mg total) by mouth 2 (two) times daily with a meal.   cholecalciferol 1000 units tablet Commonly known as:  VITAMIN D Take 1,000 Units by mouth daily.   Co Q-10 100 MG Caps Take 1 capsule by mouth 2 (two) times daily.   ferrous sulfate 325 (65 FE) MG tablet Take 325 mg by mouth daily with breakfast.   furosemide 20 MG tablet Commonly known as:  LASIX Take 1 tablet (20 mg total) by mouth daily as needed for fluid or edema (or weight gain).   HumaLOG KwikPen 100 UNIT/ML KwikPen Generic drug:  insulin lispro INJECT 6 TO 8 UNITS INTO THE SKIN TWICE DAILY   levothyroxine 75 MCG tablet Commonly known as:  SYNTHROID  TAKE 1 TABLET BY MOUTH EVERY DAY   metFORMIN 750 MG 24 hr tablet Commonly known as:  GLUCOPHAGE-XR TAKE 2 TABLETS BY MOUTH DAILY   multivitamins ther. w/minerals Tabs tablet Take 1 tablet by mouth daily.  OneTouch Verio test strip Generic drug:  glucose blood TEST THREE TIMES DAILY   glucose blood test strip Commonly known as:  OneTouch Verio USE AS DIRECTED THREE TIMES DAILY.   rosuvastatin 20 MG tablet Commonly known as:  CRESTOR TAKE 1 TABLET(20 MG) BY MOUTH DAILY   Toujeo SoloStar 300 UNIT/ML Sopn Generic drug:  Insulin Glargine (1 Unit Dial) INJECT 30 UNITS UNDER THE SKIN DAILY.   Tums 500 MG chewable tablet Generic drug:  calcium carbonate Chew 1 tablet by mouth daily as needed for heartburn (indigestion).       Allergies: No Known Allergies  Past Medical History:  Diagnosis Date  . Aortic valve disorders    S/P AVR 06/2012  . Arthritis   . Asthma    AS CHILD   . Depression   . Diabetes mellitus   . Heart murmur   . Hyperlipidemia   . Hypertension   . Hypothyroidism   . Ischemic heart disease    with prior CABG x 2 in 2014 along with AVR  . Lumbago   . Shortness of breath   . Thyroid disease     Past Surgical History:  Procedure Laterality Date  . AORTIC VALVE REPLACEMENT N/A 06/25/2012   Procedure: AORTIC VALVE REPLACEMENT (AVR);  Surgeon: Grace Isaac, MD;  Location: [facility];  Service: Open Heart Surgery;  Laterality: N/A;  . BREAST BIOPSY Right 08/2016   benign  . BREAST SURGERY     LT BREAST BX BENIGN  . CORONARY ARTERY BYPASS GRAFT N/A 06/25/2012   Procedure: Coronary artery bypass graft  times two using left internal mammary artery and right leg greater saphenous vein;  Surgeon: Grace Isaac, MD;  Location: [facility];  Service: Open Heart Surgery;  Laterality: N/A;  . INTRAOPERATIVE TRANSESOPHAGEAL ECHOCARDIOGRAM N/A 06/25/2012   Procedure: INTRAOPERATIVE TRANSESOPHAGEAL ECHOCARDIOGRAM;  Surgeon: Grace Isaac, MD;  Location: [place];   Service: Open Heart Surgery;  Laterality: N/A;  . TUBAL LIGATION      Family History  Problem Relation Age of Onset  . Heart disease Father     Social History:  reports that she has never smoked. She has never used smokeless tobacco. She reports that she does not drink alcohol or use drugs.  Review of Systems:   HYPERTENSION: Blood pressure is followed by PCP Treatment regimen includes benazepril She has a meter at home but does not check her readings  Recent blood pressure readings as follows  BP Readings from Last 3 Encounters:  08/27/18 (!) 150/80  07/04/18 (!) 142/76  06/17/18 (!) 142/80     HYPOTHYROIDISM: She has had long-standing hypothyroidism  She is compliant with her levothyroxine 75 g dose She feels well  TSH has been consistent in the normal range   Lab Results  Component Value Date   TSH 0.96 08/19/2018   TSH 3.33 03/20/2018   TSH 2.330 08/29/2017   FREET4 1.02 08/19/2018   FREET4 0.92 03/20/2018   FREET4 1.01 09/11/2017    HYPERLIPIDEMIA: The lipid abnormality consists of elevated LDL; has been  controlled with Crestor 20 mg LDL is below  100  Lab Results  Component Value Date   CHOL 147 06/17/2018   HDL 52 06/17/2018   LDLCALC 75 06/17/2018   LDLDIRECT 102.8 11/28/2013   TRIG 101 06/17/2018   CHOLHDL 2.8 06/17/2018  '    She does see a podiatrist regularly for onychomycosis  Last foot exam was in 07/2018 with podiatrist: Diabetic foot exam shows  normal monofilament sensation in the toes and plantar surfaces, no skin lesions or ulcers on the feet and normal pedal pulses    Examination:   BP (!) 150/80 (BP Location: Left Arm, Patient Position: Sitting, Cuff Size: Large)   Pulse 79   Ht 5' 8.6" (1.742 m)   Wt 173 lb 6.4 oz (78.7 kg)   SpO2 97%   BMI 25.91 kg/m   Body mass index is 25.91 kg/m.    ASSESSMENT/ PLAN:   Diabetes type 2, uncontrolled   See history of present illness for a discussion of the blood sugar patterns,  recent management and problems identified  A1c was last 8.2 Fructosamine of 326 still indicates overall relatively high readings  Blood sugars have been compared to her last visit However not checking readings after meals much at all and this was discussed May still have some high readings after meals especially in the evening if she is eating some carbohydrate and not taking insulin to cover the meal She will continue the same basic regimen of insulin except she can cut down on her Toujeo down to 20 units She needs to take her Invokamet consistently with breakfast daily and not at variable times  HYPERTENSION: Not consistently-controlled May be anxious today and is going to follow-up with her PCP in a couple of weeks Encouraged her to check at home also  Hypothyroidism: Treated with 75 mcg with recent TSH normal   Patient Instructions  Check blood sugars on waking up 4 days a week  Also check blood sugars about 2 hours after meals and do this after different meals by rotation  Recommended blood sugar levels on waking up are 90-130 and about 2 hours after meal is 130-160  Please bring your blood sugar monitor to each visit, thank you  Take Invokamet in am only       Elayne Snare 08/27/2018, 9:27 AM   Note: This office note was prepared with Dragon voice recognition system technology. Any transcriptional errors that result from this process are unintentional.

## 2018-08-27 NOTE — Patient Instructions (Addendum)
Check blood sugars on waking up 4 days a week  Also check blood sugars about 2 hours after meals and do this after different meals by rotation  Recommended blood sugar levels on waking up are 90-130 and about 2 hours after meal is 130-160  Please bring your blood sugar monitor to each visit, thank you  Take Invokamet in am only  Check BP at home

## 2018-09-18 ENCOUNTER — Other Ambulatory Visit: Payer: Self-pay | Admitting: Endocrinology

## 2018-09-19 ENCOUNTER — Other Ambulatory Visit: Payer: Self-pay

## 2018-09-19 MED ORDER — METFORMIN HCL ER 750 MG PO TB24: ORAL_TABLET | ORAL | 0 refills | Status: DC

## 2018-10-15 ENCOUNTER — Telehealth: Payer: Self-pay | Admitting: *Deleted

## 2018-10-15 NOTE — Telephone Encounter (Signed)

## 2018-10-15 NOTE — Progress Notes (Signed)
Telehealth Visit     Virtual Visit via Video Note   This visit type was conducted due to national recommendations for restrictions regarding the COVID-19 Pandemic (e.g. social distancing) in an effort to limit this patient's exposure and mitigate transmission in our community.  Due to her co-morbid illnesses, this patient is at least at moderate risk for complications without adequate follow up.  This format is felt to be most appropriate for this patient at this time.  All issues noted in this document were discussed and addressed.  A limited physical exam was performed with this format.  Please refer to the patient's chart for her consent to telehealth for [facility].   Evaluation Performed:  Follow-up visit  This visit type was conducted due to national recommendations for restrictions regarding the COVID-19 Pandemic (e.g. social distancing).  This format is felt to be most appropriate for this patient at this time.  All issues noted in this document were discussed and addressed.  No physical exam was performed (except for noted visual exam findings with Video Visits).  Please refer to the patient's chart (MyChart message for video visits and phone note for telephone visits) for the patient's consent to telehealth for [facility].  Date:  10/16/2018   ID:  [name], DOB 1933/04/28, MRN [id number]  Patient Location:  Home  Provider location:   Home  PCP:  Rogers Blocker, MD  Cardiologist:  Servando Snare Nahser Electrophysiologist:  None   Chief Complaint:  Follow up  History of Present Illness:    [name] is a 85 y.o. female who presents via audio/video conferencing for a telehealth visit today.  Seen for Dr. Acie Fredrickson. She primarily follows with me.   She has a history of DM, prior AVR and CABG x 2 per EBG back in 2014 - this was with a LIMA to the LAD and SVG to the LCX,with a pericardial Edwards tissue valve #21 mm. Did have post op atrial fib - was on  amiodarone. Other issues include HLD, HTN, DM, depression and hypothyroidism. Myoview ok from 06/2013. Last echo 12/2014.   I saw her back in September of 2016 for a work in visit - she was short of breath. Labs looked ok. Blood count actually improved. Echo updated - did have elevated filling pressures but otherwise unchanged. Prosthetic AV ok. Normal EF and had grade 1 diastolic dysfunction. I wanted to put her on low dose Lasix but she did not wish to proceed due to having frequent urination when on Invokana.   Seen in November of 2016 by me and she was doing ok- not really clear as to why she had improved but she had. She had talked with Dr. Dwyane Dee and he also encouraged prn diuretic. She was agreeable to trying. Seen several times back since- BP still low and Plendil had been stopped altogether. Shestruggles  onlosing weight.Does have some chronic fatigue issues.She has been quite depressed over the past several times I have seen her - to the point that she is more inactive - I actually was able to gether on low dose SSRI which helped her considerably.   Last seen by me in February and she was doing ok. Had had a prior ER visit for possible hypoglycemia after mixing up her diabetic injections. Cardiac status was ok.   The patient does not have symptoms concerning for COVID-19 infection (fever, chills, cough, or new shortness of breath).   Seen today by telephone conversation. She does  not have MyChart, no Internet or smart phone. She has consented for this visit. She is doing ok. Says she is doing fairly well. Little bored. Probably watching TV too much.  No chest pain. Breathing is ok. She was placed on Bystolic by Dr. Marlou Sa for her BP. She notes it has helped - BP today is up some today - she is seeing him again in 2 weeks. She has had another eye injection as well. Tolerating her medicines.   Past Medical History:  Diagnosis Date   Aortic valve disorders    S/P AVR 06/2012    Arthritis    Asthma    AS CHILD    Depression    Diabetes mellitus    Heart murmur    Hyperlipidemia    Hypertension    Hypothyroidism    Ischemic heart disease    with prior CABG x 2 in 2014 along with AVR   Lumbago    Shortness of breath    Thyroid disease    Past Surgical History:  Procedure Laterality Date   AORTIC VALVE REPLACEMENT N/A 06/25/2012   Procedure: AORTIC VALVE REPLACEMENT (AVR);  Surgeon: Grace Isaac, MD;  Location: [place];  Service: Open Heart Surgery;  Laterality: N/A;   BREAST BIOPSY Right 08/2016   benign   BREAST SURGERY     LT BREAST BX BENIGN   CORONARY ARTERY BYPASS GRAFT N/A 06/25/2012   Procedure: Coronary artery bypass graft  times two using left internal mammary artery and right leg greater saphenous vein;  Surgeon: Grace Isaac, MD;  Location: [place];  Service: Open Heart Surgery;  Laterality: N/A;   INTRAOPERATIVE TRANSESOPHAGEAL ECHOCARDIOGRAM N/A 06/25/2012   Procedure: INTRAOPERATIVE TRANSESOPHAGEAL ECHOCARDIOGRAM;  Surgeon: Grace Isaac, MD;  Location: [place];  Service: Open Heart Surgery;  Laterality: N/A;   TUBAL LIGATION       Current Meds  Medication Sig   aspirin EC 81 MG tablet Take 1 tablet (81 mg total) by mouth daily.   benazepril (LOTENSIN) 20 MG tablet Take 20 mg by mouth daily.    calcium carbonate (TUMS) 500 MG chewable tablet Chew 1 tablet by mouth daily as needed for heartburn (indigestion).   carvedilol (COREG) 12.5 MG tablet Take 1 tablet (12.5 mg total) by mouth 2 (two) times daily with a meal.   cholecalciferol (VITAMIN D) 1000 UNITS tablet Take 1,000 Units by mouth daily.   Coenzyme Q10 (CO Q-10) 100 MG CAPS Take 1 capsule by mouth 2 (two) times daily.   furosemide (LASIX) 20 MG tablet Take 1 tablet (20 mg total) by mouth daily as needed for fluid or edema (or weight gain).   HUMALOG KWIKPEN 100 UNIT/ML KiwkPen INJECT 6 TO 8 UNITS INTO THE SKIN TWICE DAILY   levothyroxine (SYNTHROID,  LEVOTHROID) 75 MCG tablet TAKE 1 TABLET BY MOUTH EVERY DAY   memantine (NAMENDA) 5 MG tablet Take 5 mg by mouth at bedtime.   metFORMIN (GLUCOPHAGE-XR) 750 MG 24 hr tablet Take 1 tablet by mouth once daily. (Patient taking differently: Take 750 mg by mouth 2 (two) times a day. Take 1 tablet by mouth once daily.)   Multiple Vitamins-Minerals (MULTIVITAMINS THER. W/MINERALS) TABS Take 1 tablet by mouth daily.   nebivolol (BYSTOLIC) 5 MG tablet Take 5 mg by mouth daily.   rosuvastatin (CRESTOR) 20 MG tablet TAKE 1 TABLET(20 MG) BY MOUTH DAILY   TOUJEO SOLOSTAR 300 UNIT/ML SOPN INJECT 30 UNITS UNDER THE SKIN DAILY. (Patient taking differently: No sig  reported)   vitamin B-12 (CYANOCOBALAMIN) 100 MCG tablet Take 100 mcg by mouth daily.   [DISCONTINUED] B-D UF III MINI PEN NEEDLES 31G X 5 MM MISC USE 3 PER DAY     Allergies:   Patient has no known allergies.   Social History   Tobacco Use   Smoking status: Never Smoker   Smokeless tobacco: Never Used  Substance Use Topics   Alcohol use: No   Drug use: No     Family Hx: The patient's family history includes Heart disease in her father.  ROS:   Please see the history of present illness.   All other systems reviewed are negative.    Objective:    Vital Signs:  BP (!) 156/73 Comment: after medications   Pulse 64    Ht 5\' 3"  (1.6 m)    BMI 30.72 kg/m    Wt Readings from Last 3 Encounters:  08/27/18 173 lb 6.4 oz (78.7 kg)  07/04/18 176 lb (79.8 kg)  06/17/18 172 lb 12.8 oz (78.4 kg)    Alert female in no acute distress. Not short of breath with conversation.    Labs/Other Tests and Data Reviewed:    Lab Results  Component Value Date   WBC 4.9 06/17/2018   HGB 10.2 (L) 06/17/2018   HCT 31.8 (L) 06/17/2018   PLT 197 06/17/2018   GLUCOSE 103 (H) 08/19/2018   CHOL 147 06/17/2018   TRIG 101 06/17/2018   HDL 52 06/17/2018   LDLDIRECT 102.8 11/28/2013   LDLCALC 75 06/17/2018   ALT 11 06/24/2018   AST 20 06/24/2018    NA 138 08/19/2018   K 3.8 08/19/2018   CL 101 08/19/2018   CREATININE 0.99 08/19/2018   BUN 19 08/19/2018   CO2 28 08/19/2018   TSH 0.96 08/19/2018   INR 1.57 (H) 06/25/2012   HGBA1C 8.2 (H) 06/24/2018   MICROALBUR 0.7 03/20/2018     BNP (last 3 results) No results for input(s): BNP in the last 8760 hours.  ProBNP (last 3 results) No results for input(s): PROBNP in the last 8760 hours.    Prior CV studies:    The following studies were reviewed today:  EchoStudy Conclusions8/2018  - Left ventricle: The cavity size was normal. Systolic function was normal. The estimated ejection fraction was in the range of 55% to 60%. Wall motion was normal; there were no regional wall motion abnormalities. There was an increased relative contribution of atrial contraction to ventricular filling. Doppler parameters are consistent with abnormal left ventricular relaxation (grade 1 diastolic dysfunction). - Aortic valve: A pericardial bioprosthesis was present and functioning normally. Mean gradient (S): 9 mm Hg. Peak gradient (S): 17 mm Hg. - Mitral valve: Calcified annulus. Mildly thickened leaflets . There was mild to moderate regurgitation. - Left atrium: The atrium was mildly dilated. - Atrial septum: There was increased thickness of the septum, consistent with lipomatous hypertrophy. - Pulmonary arteries: PA peak pressure: 42 mm Hg (S).  Impressions:  - The right ventricular systolic pressure was increased consistent with moderate pulmonary hypertension.   Myoview Impression from 06/2013 Exercise Capacity: Lexiscan with no exercise. BP Response: Normal blood pressure response. Clinical Symptoms: No significant symptoms noted. ECG Impression: There are scattered PVCs. Comparison with Prior Nuclear Study: No previous nuclear study performed  Overall Impression: Low risk stress nuclear study small fixed distal anterior artifact which could  be breast attenuation or other artifact.  LV Ejection Fraction: 66%. LV Wall Motion: Normal Wall Motion  Chrissie Noa  C. Debara Pickett, MD, [facility]   ASSESSMENT & PLAN:    1.CAD - remote CABG 2014 - stable Myoview from 2015 - no active symptoms noted. Favor continued Conservation officer, nature.   2. Prior AVR for AS - last echo stable from August of 2018. Consider updating later this year or next.    3.Depression -remains on low dose Lexapro - seems better.   4. HTN - was placed on Bystolic - she notes this has helped although the reading today is somewhat elevated. Will follow for now.   5. Obesity-activity encouraged.She admits she is watching too much TV.   6. Mild anemia - chronic - lab from February noted.   7. DM - per PCP - A1C from February noted.   8. COVID-19 Education: The signs and symptoms of COVID-19 were discussed with the patient and how to seek care for testing (follow up with PCP or arrange E-visit).  The importance of social distancing, staying at home, hand hygiene and wearing a mask when out in public were discussed today.  Patient Risk:   After full review of this patient's clinical status, I feel that they are at least moderate risk at this time.  Time:   Today, I have spent 8 minutes with the patient with telehealth technology discussing the above issues.     Medication Adjustments/Labs and Tests Ordered: Current medicines are reviewed at length with the patient today.  Concerns regarding medicines are outlined above.   Tests Ordered: No orders of the defined types were placed in this encounter.   Medication Changes: No orders of the defined types were placed in this encounter.   Disposition:  FU with me in 3 months with fasting labs.   Patient is agreeable to this plan and will call if any problems develop in the interim.   Amie Critchley, NP  10/16/2018 9:42 AM    [place]

## 2018-10-16 ENCOUNTER — Other Ambulatory Visit: Payer: Self-pay

## 2018-10-16 ENCOUNTER — Telehealth (INDEPENDENT_AMBULATORY_CARE_PROVIDER_SITE_OTHER): Payer: Medicare Other | Admitting: Nurse Practitioner

## 2018-10-16 ENCOUNTER — Encounter: Payer: Self-pay | Admitting: Nurse Practitioner

## 2018-10-16 VITALS — BP 156/73 | HR 64 | Ht 63.0 in

## 2018-10-16 DIAGNOSIS — E7849 Other hyperlipidemia: Secondary | ICD-10-CM

## 2018-10-16 DIAGNOSIS — Z952 Presence of prosthetic heart valve: Secondary | ICD-10-CM

## 2018-10-16 DIAGNOSIS — Z7189 Other specified counseling: Secondary | ICD-10-CM

## 2018-10-16 DIAGNOSIS — Z951 Presence of aortocoronary bypass graft: Secondary | ICD-10-CM

## 2018-10-16 DIAGNOSIS — I259 Chronic ischemic heart disease, unspecified: Secondary | ICD-10-CM

## 2018-10-16 DIAGNOSIS — I10 Essential (primary) hypertension: Secondary | ICD-10-CM

## 2018-10-16 NOTE — Patient Instructions (Addendum)
After Visit Summary:  We will be checking the following labs today - NONE   Medication Instructions:    Continue with your current medicines.    If you need a refill on your cardiac medications before your next appointment, please call your pharmacy.     Testing/Procedures To Be Arranged:  N/A  Follow-Up:   See me in 3 months with fasting labs.     At [facility], you and your health needs are our priority.  As part of our continuing mission to provide you with exceptional heart care, we have created designated Provider Care Teams.  These Care Teams include your primary Cardiologist (physician) and Advanced Practice Providers (APPs -  Physician Assistants and Nurse Practitioners) who all work together to provide you with the care you need, when you need it.  Special Instructions:  . Stay safe, stay home, wash your hands for at least 20 seconds and wear a mask when out in public.  . It was good to talk with you today.  . Don't watch so much of the TV - unless its a show that makes you laugh!   Call the [place] office at [phone] if you have any questions, problems or concerns.

## 2018-11-12 ENCOUNTER — Telehealth: Payer: Self-pay | Admitting: Endocrinology

## 2018-11-12 NOTE — Telephone Encounter (Signed)
Last office note indicates that pt was taking Metformin 750mg  ER once daily in the evening. Do you want her to increase to 2 tabs daily.

## 2018-11-12 NOTE — Telephone Encounter (Signed)
Called and left detailed voicemail with MD message.

## 2018-11-12 NOTE — Telephone Encounter (Signed)
Patient called re: Patient usually takes 2 tablets of Metformin per day but when patient picked up her refill of Metformin the instructions for dosage was changed to 1 tablet per day. Patient would like to know who changed the dosage and why. Please call patient at ph# [phone] to advise.

## 2018-11-12 NOTE — Telephone Encounter (Signed)
She takes 1 tablet of metformin along with half tablet of Invokamet

## 2018-11-20 ENCOUNTER — Other Ambulatory Visit: Payer: Self-pay | Admitting: Endocrinology

## 2018-11-22 ENCOUNTER — Encounter: Payer: Self-pay | Admitting: Podiatry

## 2018-11-22 ENCOUNTER — Other Ambulatory Visit: Payer: Self-pay

## 2018-11-22 ENCOUNTER — Ambulatory Visit (INDEPENDENT_AMBULATORY_CARE_PROVIDER_SITE_OTHER): Payer: Medicare Other | Admitting: Podiatry

## 2018-11-22 VITALS — Temp 97.2°F

## 2018-11-22 DIAGNOSIS — B351 Tinea unguium: Secondary | ICD-10-CM

## 2018-11-22 DIAGNOSIS — M79676 Pain in unspecified toe(s): Secondary | ICD-10-CM

## 2018-11-22 DIAGNOSIS — M2041 Other hammer toe(s) (acquired), right foot: Secondary | ICD-10-CM | POA: Diagnosis not present

## 2018-11-22 DIAGNOSIS — L84 Corns and callosities: Secondary | ICD-10-CM

## 2018-11-22 DIAGNOSIS — IMO0001 Reserved for inherently not codable concepts without codable children: Secondary | ICD-10-CM

## 2018-11-22 DIAGNOSIS — E1165 Type 2 diabetes mellitus with hyperglycemia: Secondary | ICD-10-CM | POA: Diagnosis not present

## 2018-11-22 NOTE — Progress Notes (Signed)
Complaint:  Visit Type: Patient returns to my office for continued preventative foot care services. Complaint: Patient states" my nails have grown long and thick and become painful to walk and wear shoes" Patient has been diagnosed with DM with no foot complications. The patient presents for preventative foot care services. No changes to ROS.  Patient has painful hammer toe second right foot.  Podiatric Exam: Vascular: dorsalis pedis are palpable bilateral.  Posterior tibial pulses are not palpable.Capillary return is immediate. Temperature gradient is WNL. Skin turgor WNL  Sensorium: Diminished  Semmes Weinstein monofilament test. Normal tactile sensation bilaterally. Nail Exam: Pt has thick disfigured discolored nails with subungual debris noted bilateral entire nail hallux through fifth toenails Ulcer Exam: There is no evidence of ulcer or pre-ulcerative changes or infection. Orthopedic Exam: Muscle tone and strength are WNL. No limitations in general ROM. No crepitus or effusions noted. Foot type and digits show no abnormalities. HAV  B/L.  Hammer toe second right. Skin: No Porokeratosis asymptomatic.Marland Kitchen No infection or ulcers.  HD second toe right foot.  Diagnosis:  Onychomycosis, , Pain in right toe, pain in left toes  Treatment & Plan Procedures and Treatment: Consent by patient was obtained for treatment procedures.   Debridement of mycotic and hypertrophic toenails, 1 through 5 bilateral and clearing of subungual debris. No ulceration, no infection noted. Debride heloma durum second toe right foot.   Return Visit-Office Procedure: Patient instructed to return to the office for a follow up visit 3 months for continued evaluation and treatment.    Gardiner Barefoot DPM

## 2018-11-25 ENCOUNTER — Other Ambulatory Visit (INDEPENDENT_AMBULATORY_CARE_PROVIDER_SITE_OTHER): Payer: Medicare Other

## 2018-11-25 ENCOUNTER — Other Ambulatory Visit: Payer: Self-pay

## 2018-11-25 DIAGNOSIS — E1165 Type 2 diabetes mellitus with hyperglycemia: Secondary | ICD-10-CM

## 2018-11-25 DIAGNOSIS — Z794 Long term (current) use of insulin: Secondary | ICD-10-CM | POA: Diagnosis not present

## 2018-11-25 LAB — COMPREHENSIVE METABOLIC PANEL
ALT: 11 U/L (ref 0–35)
AST: 19 U/L (ref 0–37)
Albumin: 4.3 g/dL (ref 3.5–5.2)
Alkaline Phosphatase: 43 U/L (ref 39–117)
BUN: 22 mg/dL (ref 6–23)
CO2: 29 mEq/L (ref 19–32)
Calcium: 9.8 mg/dL (ref 8.4–10.5)
Chloride: 100 mEq/L (ref 96–112)
Creatinine, Ser: 0.95 mg/dL (ref 0.40–1.20)
GFR: 67.87 mL/min (ref >60.00)
Glucose, Bld: 171 mg/dL — ABNORMAL HIGH (ref 70–99)
Potassium: 4.1 mEq/L (ref 3.5–5.1)
Sodium: 137 mEq/L (ref 135–145)
Total Bilirubin: 0.5 mg/dL (ref 0.2–1.2)
Total Protein: 7.7 g/dL (ref 6.0–8.3)

## 2018-11-25 LAB — HEMOGLOBIN A1C: Hgb A1c MFr Bld: 7.6 % — ABNORMAL HIGH (ref 4.6–6.5)

## 2018-11-26 ENCOUNTER — Other Ambulatory Visit: Payer: Self-pay

## 2018-11-28 ENCOUNTER — Ambulatory Visit (INDEPENDENT_AMBULATORY_CARE_PROVIDER_SITE_OTHER): Payer: Medicare Other | Admitting: Endocrinology

## 2018-11-28 ENCOUNTER — Encounter: Payer: Self-pay | Admitting: Endocrinology

## 2018-11-28 ENCOUNTER — Other Ambulatory Visit: Payer: Self-pay

## 2018-11-28 VITALS — BP 140/80 | HR 73 | Ht 63.0 in | Wt 177.0 lb

## 2018-11-28 DIAGNOSIS — E063 Autoimmune thyroiditis: Secondary | ICD-10-CM | POA: Diagnosis not present

## 2018-11-28 DIAGNOSIS — E1165 Type 2 diabetes mellitus with hyperglycemia: Secondary | ICD-10-CM

## 2018-11-28 DIAGNOSIS — F329 Major depressive disorder, single episode, unspecified: Secondary | ICD-10-CM

## 2018-11-28 DIAGNOSIS — I259 Chronic ischemic heart disease, unspecified: Secondary | ICD-10-CM

## 2018-11-28 DIAGNOSIS — F32A Depression, unspecified: Secondary | ICD-10-CM

## 2018-11-28 DIAGNOSIS — Z794 Long term (current) use of insulin: Secondary | ICD-10-CM

## 2018-11-28 MED ORDER — ESCITALOPRAM OXALATE 10 MG PO TABS: 10.0000 mg | ORAL_TABLET | Freq: Every day | ORAL | 1 refills | Status: DC

## 2018-11-28 NOTE — Progress Notes (Signed)
[name] 85 y.o.            Reason for Appointment: Endocrinology follow-up   History of Present Illness   Diagnosis: Type 2 DIABETES MELITUS, date of diagnosis:   2000   She has been on insulin since 2004 with difficulty controlling adequately because of compliance with diet and variability in blood sugars. Her previous records are not available at present, however she has been on basal insulin with mealtime coverage since at least 2007 She has been tried on Byetta previously but did not tolerate this well She tried Victoza but didn't not benefit from Victoza 0.6 dosage; had nausea from 1.2 mg; did not continue this because of this side effect She also was given a trial of the V-go pump but she subjectively did not like this  RECENT history:    INSULIN: Toujeo 22 units in morning, HUMALOG 6 units Acb, 6/8 lunch and 4 at supper    Oral hypoglycemic drugs: Metformin er 750 mg pm and Invokamet 50/1000, half tablet at variable times   A1c is usually around 8%, now 7.6   She has the following blood sugar patterns and problems identified  She has again stopped taking her Invokamet in the last few weeks  She does not want to take more medications and is concerned about the cost  However her compliance with her insulin at mealtimes has been poor  She frequently forgets to take her lunchtime Humalog before eating even though she is mostly eating at home  She will check her sugar after eating sometimes and then take the Humalog when blood sugar is over 200 which it usually is  Also may be trying to cut back on her expense as she keeps asking for samples whenever she comes in  She also has checked blood sugars only rarely at lunchtime and this was once high  Usually not eating large meals in the evenings but still occasionally has readings over 200 after dinner also  No hypoglycemia reported  Her FASTING readings are somewhat variable but averaging below 130  No  overnight hypoglycemia  She is not motivated to exercise and her weight is slightly higher      .         Monitors blood glucose:  about 2-3 times daily     Glucometer: One Touch Verio.          Blood Glucose readings from download:    PRE-MEAL Fasting Lunch Dinner Bedtime Overall  Glucose range:  83-169  296     Mean/median:  127    164   POST-MEAL PC Breakfast PC Lunch PC Dinner  Glucose range:   212-276  116-240  Mean/median:      Previous readings:  PRE-MEAL Fasting Lunch Dinner Bedtime Overall  Glucose range: 84-162   123-386   61-386  Mean/median:  124  237  172   143   POST-MEAL PC Breakfast PC Lunch PC Dinner  Glucose range:    80-320  Mean/median:   163      Meals: 3 meals per day:  breakfast is At 7 am;  lunch 12-2 pm, dinner 5 pm Intake is quite variable at different meals, usually lighter supper    Wt Readings from Last 3 Encounters:  11/28/18 177 lb (80.3 kg)  08/27/18 173 lb 6.4 oz (78.7 kg)  07/04/18 176 lb (79.8 kg)    Lab Results  Component Value Date   HGBA1C 7.6 (H) 11/25/2018   HGBA1C  8.2 (H) 06/24/2018   HGBA1C 7.5 (H) 03/20/2018   Lab Results  Component Value Date   MICROALBUR 0.7 03/20/2018   LDLCALC 75 06/17/2018   CREATININE 0.95 11/25/2018    Lab on 11/25/2018  Component Date Value Ref Range Status   Sodium 11/25/2018 137  135 - 145 mEq/L Final   Potassium 11/25/2018 4.1  3.5 - 5.1 mEq/L Final   Chloride 11/25/2018 100  96 - 112 mEq/L Final   CO2 11/25/2018 29  19 - 32 mEq/L Final   Glucose, Bld 11/25/2018 171* 70 - 99 mg/dL Final   BUN 11/25/2018 22  6 - 23 mg/dL Final   Creatinine, Ser 11/25/2018 0.95  0.40 - 1.20 mg/dL Final   Total Bilirubin 11/25/2018 0.5  0.2 - 1.2 mg/dL Final   Alkaline Phosphatase 11/25/2018 43  39 - 117 U/L Final   AST 11/25/2018 19  0 - 37 U/L Final   ALT 11/25/2018 11  0 - 35 U/L Final   Total Protein 11/25/2018 7.7  6.0 - 8.3 g/dL Final   Albumin 11/25/2018 4.3  3.5 - 5.2 g/dL Final     Calcium 11/25/2018 9.8  8.4 - 10.5 mg/dL Final   GFR 11/25/2018 67.87  >60.00 mL/min Final   Hgb A1c MFr Bld 11/25/2018 7.6* 4.6 - 6.5 % Final   Glycemic Control Guidelines for People with Diabetes:Non Diabetic:  <6%Goal of Therapy: <7%Additional Action Suggested:  >8%      Allergies as of 11/28/2018   No Known Allergies     Medication List       Accurate as of November 28, 2018  9:17 AM. If you have any questions, ask your nurse or doctor.        aspirin EC 81 MG tablet Take 1 tablet (81 mg total) by mouth daily.   benazepril 20 MG tablet Commonly known as: LOTENSIN Take 20 mg by mouth daily.   carvedilol 12.5 MG tablet Commonly known as: Coreg Take 1 tablet (12.5 mg total) by mouth 2 (two) times daily with a meal.   cholecalciferol 1000 units tablet Commonly known as: VITAMIN D Take 1,000 Units by mouth daily.   Co Q-10 100 MG Caps Take 1 capsule by mouth 2 (two) times daily.   furosemide 20 MG tablet Commonly known as: LASIX Take 1 tablet (20 mg total) by mouth daily as needed for fluid or edema (or weight gain).   HumaLOG KwikPen 100 UNIT/ML KwikPen Generic drug: insulin lispro INJECT 6 TO 8 UNITS INTO THE SKIN TWICE DAILY   levothyroxine 75 MCG tablet Commonly known as: SYNTHROID TAKE 1 TABLET BY MOUTH EVERY DAY   memantine 5 MG tablet Commonly known as: NAMENDA Take 5 mg by mouth at bedtime.   metFORMIN 750 MG 24 hr tablet Commonly known as: GLUCOPHAGE-XR Take 1 tablet by mouth once daily. What changed:   how much to take  how to take this  when to take this   multivitamins ther. w/minerals Tabs tablet Take 1 tablet by mouth daily.   nebivolol 5 MG tablet Commonly known as: BYSTOLIC Take 5 mg by mouth daily.   rosuvastatin 20 MG tablet Commonly known as: CRESTOR TAKE 1 TABLET(20 MG) BY MOUTH DAILY   Toujeo Max SoloStar 300 UNIT/ML Sopn Generic drug: Insulin Glargine (2 Unit Dial) Inject 22 Units into the skin daily. Inject 22 units  under the skin once daily. What changed: Another medication with the same name was removed. Continue taking this medication, and follow the  directions you see here. Changed by: Elayne Snare, MD   Tums 500 MG chewable tablet Generic drug: calcium carbonate Chew 1 tablet by mouth daily as needed for heartburn (indigestion).   vitamin B-12 100 MCG tablet Commonly known as: CYANOCOBALAMIN Take 100 mcg by mouth daily.       Allergies: No Known Allergies  Past Medical History:  Diagnosis Date   Aortic valve disorders    S/P AVR 06/2012   Arthritis    Asthma    AS CHILD    Depression    Diabetes mellitus    Heart murmur    Hyperlipidemia    Hypertension    Hypothyroidism    Ischemic heart disease    with prior CABG x 2 in 2014 along with AVR   Lumbago    Shortness of breath    Thyroid disease     Past Surgical History:  Procedure Laterality Date   AORTIC VALVE REPLACEMENT N/A 06/25/2012   Procedure: AORTIC VALVE REPLACEMENT (AVR);  Surgeon: Grace Isaac, MD;  Location: [facility];  Service: Open Heart Surgery;  Laterality: N/A;   BREAST BIOPSY Right 08/2016   benign   BREAST SURGERY     LT BREAST BX BENIGN   CORONARY ARTERY BYPASS GRAFT N/A 06/25/2012   Procedure: Coronary artery bypass graft  times two using left internal mammary artery and right leg greater saphenous vein;  Surgeon: Grace Isaac, MD;  Location: [facility];  Service: Open Heart Surgery;  Laterality: N/A;   INTRAOPERATIVE TRANSESOPHAGEAL ECHOCARDIOGRAM N/A 06/25/2012   Procedure: INTRAOPERATIVE TRANSESOPHAGEAL ECHOCARDIOGRAM;  Surgeon: Grace Isaac, MD;  Location: [facility];  Service: Open Heart Surgery;  Laterality: N/A;   TUBAL LIGATION      Family History  Problem Relation Age of Onset   Heart disease Father     Social History:  reports that she has never smoked. She has never used smokeless tobacco. She reports that she does not drink alcohol or use drugs.  Review of  Systems:   HYPERTENSION: Blood pressure is followed by PCP Treatment regimen includes benazepril She has a meter at home: She is now taking Bystolic in addition to carvedilol from her PCP  BP Readings from Last 3 Encounters:  11/28/18 140/80  10/16/18 (!) 156/73  08/27/18 (!) 150/80     HYPOTHYROIDISM: She has had long-standing hypothyroidism  She is compliant with her levothyroxine 75 g dose She feels well  TSH has been recently in the normal range   Lab Results  Component Value Date   TSH 0.96 08/19/2018   TSH 3.33 03/20/2018   TSH 2.330 08/29/2017   FREET4 1.02 08/19/2018   FREET4 0.92 03/20/2018   FREET4 1.01 09/11/2017    HYPERLIPIDEMIA: The lipid abnormality consists of elevated LDL; has been  controlled with Crestor 20 mg LDL is below  100  Lab Results  Component Value Date   CHOL 147 06/17/2018   HDL 52 06/17/2018   LDLCALC 75 06/17/2018   LDLDIRECT 102.8 11/28/2013   TRIG 101 06/17/2018   CHOLHDL 2.8 06/17/2018  '    She does see a podiatrist regularly for onychomycosis  Last foot exam was in 07/2018 with podiatrist: Diabetic foot exam shows normal monofilament sensation in the toes and plantar surfaces, no skin lesions or ulcers on the feet and normal pedal pulses  DEPRESSION: She says she is feeling depressed and did not talk to her PCP on her last visit about this Review of her records indicate she  had taking Lexapro previously with relief and not clear why she stopped taking this   Examination:   BP 140/80 (BP Location: Left Arm, Patient Position: Sitting, Cuff Size: Normal)    Pulse 73    Ht 5\' 3"  (1.6 m)    Wt 177 lb (80.3 kg)    SpO2 98%    BMI 31.35 kg/m   Body mass index is 31.35 kg/m.    ASSESSMENT/ PLAN:   Diabetes type 2, uncontrolled   See history of present illness for a discussion of the blood sugar patterns, recent management and problems identified  A1c = 7.6  Even though her blood sugars are higher postprandially she is  having a slightly better A1c Not clear how often she is taking her Humalog at mealtimes but her blood sugars are fairly consistently high after lunch when she checks them Also may not be taking her insulin consistently at breakfast for Humalog or not enough insulin  She usually does a little better with Invokamet but she again stops taking this every now and then to conserve her finances Currently she is not motivated to take her mealtime insulin consistently and sometimes may forget  Discussed in detail the need to consistently take her mealtime insulin and that she is insulin deficient needing basal bolus insulin regimen lifelong now Taking insulin after meals is not recommended She was given your blood sugar and insulin log to keep so that she can make sure she is taking her insulin before lunch consistently Also will help her with compliance with mealtime insulin at breakfast Sample of Humalog given Explained to her that if her sugars are over 200 after breakfast or lunch she will at least need to go up 2 units on the mealtime insulin No change in Toujeo recommended She will also try to take at least half a tablet of Invokamet before breakfast daily to help with control and some long-term benefit  HYPERTENSION: Appears better controlled  DEPRESSION: She has recurrent depression, previously treated with Lexapro This is also affecting her motivation to take care of her diabetes  Hypothyroidism: Treated with 75 mcg and will need repeat TSH in the next visit  Counseling time on subjects discussed in assessment and plan sections is over 50% of today's 25 minute visit    There are no Patient Instructions on file for this visit.      Elayne Snare 11/28/2018, 9:17 AM   Note: This office note was prepared with Dragon voice recognition system technology. Any transcriptional errors that result from this process are unintentional.

## 2018-11-28 NOTE — Patient Instructions (Addendum)
1/2 Invokamet in am before Bfst  Check blood sugars on waking up 4-5 days a week  Also check blood sugars about 2 hours after meals and do this after different meals by rotation  Recommended blood sugar levels on waking up are 90-130 and about 2 hours after meal is 130-160  Please bring your blood sugar monitor to each visit, thank you  Write down Humalog  insulin when taken

## 2018-12-16 ENCOUNTER — Telehealth: Payer: Self-pay | Admitting: Endocrinology

## 2018-12-16 ENCOUNTER — Other Ambulatory Visit: Payer: Self-pay

## 2018-12-16 MED ORDER — TOUJEO MAX SOLOSTAR 300 UNIT/ML ~~LOC~~ SOPN: 22.0000 [IU] | PEN_INJECTOR | Freq: Every day | SUBCUTANEOUS | 1 refills | Status: DC

## 2018-12-16 NOTE — Telephone Encounter (Signed)
MEDICATION: Trujeo   PHARMACY:  [facility]  IS THIS A 90 DAY SUPPLY :   IS PATIENT OUT OF MEDICATION:   IF NOT; HOW MUCH IS LEFT:   LAST APPOINTMENT DATE: @7 /30/2020  NEXT APPOINTMENT DATE:@10 /27/2020  DO WE HAVE YOUR PERMISSION TO LEAVE A DETAILED MESSAGE: yes  OTHER COMMENTS:    **Let patient know to contact pharmacy at the end of the day to make sure medication is ready. **  ** Please notify patient to allow 48-72 hours to process**  **Encourage patient to contact the pharmacy for refills or they can request refills through [facility]**

## 2018-12-16 NOTE — Telephone Encounter (Signed)
Rx sent 

## 2019-01-20 ENCOUNTER — Ambulatory Visit: Payer: Medicare Other | Admitting: Nurse Practitioner

## 2019-01-24 NOTE — Progress Notes (Signed)
CARDIOLOGY OFFICE NOTE  Date:  01/29/2019    [name] Date of Birth: 1932-05-25 Medical Record [id number]  PCP:  Rogers Blocker, MD  Cardiologist:  Servando Snare Nahser    Chief Complaint  Patient presents with   Follow-up    History of Present Illness: [name] is a 85 y.o. female who presents today for a 3 month check. Seen for Dr. Acie Fredrickson. She primarily follows with me.   She has a history of DM, prior AVR and CABG x 2 per EBG back in 2014 - this was with a LIMA to the LAD and SVG to the LCX,with a pericardial Edwards tissue valve #21 mm. Did have post op atrial fib - was on amiodarone for a short time. Other issues include HLD, HTN, DM, depression and hypothyroidism. Myoview ok from 06/2013. Last echo 12/2014.   I saw her back in September of 2016 for a work in visit - she was short of breath. Labs looked ok.  Echo updated - did have elevated filling pressures but otherwise unchanged. Prosthetic AV ok. Normal EF and had grade 1 diastolic dysfunction. I wanted to put her on low dose Lasix but she did not wish to proceed due to having frequent urination when on Invokana.   Seen in November of 2016 by me and she was doing ok- not really clear as to why she had improved but she had. She had talked with Dr. Dwyane Dee and he also encouraged prn diuretic. She was agreeable to trying. Seen several times back since- BP still low and Plendil had been stopped altogether. Shestrugglesonlosing weight.Does have some chronic fatigue issues.She has been quite depressed over the past several times I have seen her - to the point that she was more inactive - I actually was able to gether on low dose SSRIwhich helped her considerably.   Last seen by me in the office back in February and she was doing ok.Had had a prior ER visit for possible hypoglycemia after mixing up her diabetic injections. Cardiac status was ok. We did a telehealth visit back in June - she was doing ok -  watching too much TV. Had been put on Bystolic for her BP.   The patient does not have symptoms concerning for COVID-19 infection (fever, chills, cough, or new shortness of breath).   Comes in today. Here alone. She is depressed. She stopped her Lexapro a "while back". BP high. She has gained weight. Not getting out. More sedentary and still watching TV most of the day.  Feels like she is on too much medicine. On both Coreg and Bystolic. Bystolic is rather pricey for her. She is afraid of falling - thinking of getting a cane. She has not fallen.  Has had her flu shot. No chest pain. Breathing is ok. No swelling.   Past Medical History:  Diagnosis Date   Aortic valve disorders    S/P AVR 06/2012   Arthritis    Asthma    AS CHILD    Depression    Diabetes mellitus    Heart murmur    Hyperlipidemia    Hypertension    Hypothyroidism    Ischemic heart disease    with prior CABG x 2 in 2014 along with AVR   Lumbago    Shortness of breath    Thyroid disease     Past Surgical History:  Procedure Laterality Date   AORTIC VALVE REPLACEMENT N/A 06/25/2012   Procedure: AORTIC  VALVE REPLACEMENT (AVR);  Surgeon: Grace Isaac, MD;  Location: [facility];  Service: Open Heart Surgery;  Laterality: N/A;   BREAST BIOPSY Right 08/2016   benign   BREAST SURGERY     LT BREAST BX BENIGN   CORONARY ARTERY BYPASS GRAFT N/A 06/25/2012   Procedure: Coronary artery bypass graft  times two using left internal mammary artery and right leg greater saphenous vein;  Surgeon: Grace Isaac, MD;  Location: [facility];  Service: Open Heart Surgery;  Laterality: N/A;   INTRAOPERATIVE TRANSESOPHAGEAL ECHOCARDIOGRAM N/A 06/25/2012   Procedure: INTRAOPERATIVE TRANSESOPHAGEAL ECHOCARDIOGRAM;  Surgeon: Grace Isaac, MD;  Location: [facility];  Service: Open Heart Surgery;  Laterality: N/A;   TUBAL LIGATION       Medications: Current Meds  Medication Sig   aspirin EC 81 MG tablet Take 1 tablet (81  mg total) by mouth daily.   benazepril (LOTENSIN) 20 MG tablet Take 1 tablet (20 mg total) by mouth 2 (two) times daily.   calcium carbonate (TUMS) 500 MG chewable tablet Chew 1 tablet by mouth daily as needed for heartburn (indigestion).   carvedilol (COREG) 12.5 MG tablet Take 1 tablet (12.5 mg total) by mouth 2 (two) times daily with a meal.   cholecalciferol (VITAMIN D) 1000 UNITS tablet Take 1,000 Units by mouth daily.   Coenzyme Q10 (CO Q-10) 100 MG CAPS Take 1 capsule by mouth 2 (two) times daily.   escitalopram (LEXAPRO) 10 MG tablet Take 1 tablet (10 mg total) by mouth daily.   furosemide (LASIX) 20 MG tablet Take 1 tablet (20 mg total) by mouth daily as needed for fluid or edema (or weight gain).   HUMALOG KWIKPEN 100 UNIT/ML KiwkPen INJECT 6 TO 8 UNITS INTO THE SKIN TWICE DAILY   Insulin Glargine, 2 Unit Dial, (TOUJEO MAX SOLOSTAR) 300 UNIT/ML SOPN Inject 22 Units into the skin daily. Inject 22 units under the skin once daily.   levothyroxine (SYNTHROID) 75 MCG tablet TAKE 1 TABLET BY MOUTH EVERY DAY   memantine (NAMENDA) 5 MG tablet Take 5 mg by mouth at bedtime.   metFORMIN (GLUCOPHAGE-XR) 750 MG 24 hr tablet Take 1 tablet by mouth once daily. (Patient taking differently: Take 750 mg by mouth daily. Take 1 tablet by mouth once daily.)   Multiple Vitamins-Minerals (MULTIVITAMINS THER. W/MINERALS) TABS Take 1 tablet by mouth daily.   rosuvastatin (CRESTOR) 20 MG tablet TAKE 1 TABLET(20 MG) BY MOUTH DAILY   vitamin B-12 (CYANOCOBALAMIN) 100 MCG tablet Take 100 mcg by mouth daily.   [DISCONTINUED] benazepril (LOTENSIN) 20 MG tablet Take 20 mg by mouth daily.    [DISCONTINUED] nebivolol (BYSTOLIC) 5 MG tablet Take 5 mg by mouth daily.      Allergies: No Known Allergies  Social History: The patient  reports that she has never smoked. She has never used smokeless tobacco. She reports that she does not drink alcohol or use drugs.   Family History: The patient's  family history includes Heart disease in her father.   Review of Systems: Please see the history of present illness.   All other systems are reviewed and negative.   Physical Exam: VS:  BP (!) 160/82 (BP Location: Left Arm, Patient Position: Sitting, Cuff Size: Normal)    Pulse 69    Ht 5\' 4"  (1.626 m)    Wt 179 lb (81.2 kg)    BMI 30.73 kg/m  .  BMI Body mass index is 30.73 kg/m.  Wt Readings from Last 3 Encounters:  01/29/19 179 lb (81.2 kg)  11/28/18 177 lb (80.3 kg)  08/27/18 173 lb 6.4 oz (78.7 kg)    General: Pleasant. But she seems depressed today. She is alert and in no acute distress.   HEENT: Normal.  Neck: Supple, no JVD, carotid bruits, or masses noted.  Cardiac: Regular rate and rhythm. Soft outflow murmur noted. No edema.  Respiratory:  Lungs are clear to auscultation bilaterally with normal work of breathing.  GI: Soft and nontender.  MS: No deformity or atrophy. Gait and ROM intact.  Skin: Warm and dry. Color is normal.  Neuro:  Strength and sensation are intact and no gross focal deficits noted.  Psych: Alert, appropriate and with normal affect.   LABORATORY DATA:  EKG:  EKG is ordered today. This demonstrates NSR - septal Q's. HR is 69  Lab Results  Component Value Date   WBC 4.9 06/17/2018   HGB 10.2 (L) 06/17/2018   HCT 31.8 (L) 06/17/2018   PLT 197 06/17/2018   GLUCOSE 171 (H) 11/25/2018   CHOL 147 06/17/2018   TRIG 101 06/17/2018   HDL 52 06/17/2018   LDLDIRECT 102.8 11/28/2013   LDLCALC 75 06/17/2018   ALT 11 11/25/2018   AST 19 11/25/2018   NA 137 11/25/2018   K 4.1 11/25/2018   CL 100 11/25/2018   CREATININE 0.95 11/25/2018   BUN 22 11/25/2018   CO2 29 11/25/2018   TSH 0.96 08/19/2018   INR 1.57 (H) 06/25/2012   HGBA1C 7.6 (H) 11/25/2018   MICROALBUR 0.7 03/20/2018     BNP (last 3 results) No results for input(s): BNP in the last 8760 hours.  ProBNP (last 3 results) No results for input(s): PROBNP in the last 8760  hours.   Other Studies Reviewed Today:  EchoStudy Conclusions8/2018  - Left ventricle: The cavity size was normal. Systolic function was normal. The estimated ejection fraction was in the range of 55% to 60%. Wall motion was normal; there were no regional wall motion abnormalities. There was an increased relative contribution of atrial contraction to ventricular filling. Doppler parameters are consistent with abnormal left ventricular relaxation (grade 1 diastolic dysfunction). - Aortic valve: A pericardial bioprosthesis was present and functioning normally. Mean gradient (S): 9 mm Hg. Peak gradient (S): 17 mm Hg. - Mitral valve: Calcified annulus. Mildly thickened leaflets . There was mild to moderate regurgitation. - Left atrium: The atrium was mildly dilated. - Atrial septum: There was increased thickness of the septum, consistent with lipomatous hypertrophy. - Pulmonary arteries: PA peak pressure: 42 mm Hg (S).  Impressions:  - The right ventricular systolic pressure was increased consistent with moderate pulmonary hypertension.   Myoview Impression from 06/2013 Exercise Capacity: Lexiscan with no exercise. BP Response: Normal blood pressure response. Clinical Symptoms: No significant symptoms noted. ECG Impression: There are scattered PVCs. Comparison with Prior Nuclear Study: No previous nuclear study performed  Overall Impression: Low risk stress nuclear study small fixed distal anterior artifact which could be breast attenuation or other artifact.  LV Ejection Fraction: 66%. LV Wall Motion: Normal Wall Motion  [name], MD, [facility]   ASSESSMENT & PLAN:    1.CAD with remote CABG/AVR form 2014 - last Myoview from 2015 and was stable - she has no active chest pain. Would favor continuing medical management.   2. AS - prior AVR - last echo from August of 2018 was stable - no cardinal symptoms.   3 Depression -  Seems  more pronounced - she thinks the  Bystolic may have made this worse - stopping Bystolic today. She is given the ok to resume the Lexapro - she has at home - reminded that this is not a medicine that should be stopped abruptly.   4. HTN - recheck is 150/80 by me - stopping Bystolic today - leave on Coreg - increasing her Lotensin to BID today. Does not sound like she is getting too much salt.   5. HLD - on statin - rechecking lab today.   6. Obesity - always a struggle for her. The pandemic has made this worse - she is encouraged to get up and move as much as she can at home.   7. Chronic anemia - rechecking today.   8. DM - per PCP  9. Health maintenance - has had her flu shot. Ok to get shingles vaccine. Ok to use a cane.   10. COVID-19 Education: The signs and symptoms of COVID-19 were discussed with the patient and how to seek care for testing (follow up with PCP or arrange E-visit).  The importance of social distancing, staying at home, hand hygiene and wearing a mask when out in public were discussed today.  Current medicines are reviewed with the patient today.  The patient does not have concerns regarding medicines other than what has been noted above.  The following changes have been made:  See above.  Labs/ tests ordered today include:    Orders Placed This Encounter  Procedures   Basic metabolic panel   CBC   Hepatic function panel   Lipid panel   EKG 12-Lead     Disposition:   FU with me in about a month.  Patient is agreeable to this plan and will call if any problems develop in the interim.   SignedTruitt Merle, NP  01/29/2019 8:38 AM  [facility] [address] [place], [place]  [address] Phone: [phone] Fax: [phone]

## 2019-01-29 ENCOUNTER — Encounter (INDEPENDENT_AMBULATORY_CARE_PROVIDER_SITE_OTHER): Payer: Self-pay

## 2019-01-29 ENCOUNTER — Encounter: Payer: Self-pay | Admitting: Nurse Practitioner

## 2019-01-29 ENCOUNTER — Other Ambulatory Visit: Payer: Self-pay

## 2019-01-29 ENCOUNTER — Ambulatory Visit (INDEPENDENT_AMBULATORY_CARE_PROVIDER_SITE_OTHER): Payer: Medicare Other | Admitting: Nurse Practitioner

## 2019-01-29 VITALS — BP 160/82 | HR 69 | Ht 64.0 in | Wt 179.0 lb

## 2019-01-29 DIAGNOSIS — Z952 Presence of prosthetic heart valve: Secondary | ICD-10-CM | POA: Diagnosis not present

## 2019-01-29 DIAGNOSIS — I259 Chronic ischemic heart disease, unspecified: Secondary | ICD-10-CM

## 2019-01-29 DIAGNOSIS — I10 Essential (primary) hypertension: Secondary | ICD-10-CM | POA: Diagnosis not present

## 2019-01-29 DIAGNOSIS — Z951 Presence of aortocoronary bypass graft: Secondary | ICD-10-CM | POA: Diagnosis not present

## 2019-01-29 DIAGNOSIS — E7849 Other hyperlipidemia: Secondary | ICD-10-CM

## 2019-01-29 DIAGNOSIS — Z7189 Other specified counseling: Secondary | ICD-10-CM

## 2019-01-29 LAB — CBC
Hematocrit: 32.7 % — ABNORMAL LOW (ref 34.0–46.6)
Hemoglobin: 10.8 g/dL — ABNORMAL LOW (ref 11.1–15.9)
MCH: 31.2 pg (ref 26.6–33.0)
MCHC: 33 g/dL (ref 31.5–35.7)
MCV: 95 fL (ref 79–97)
Platelets: 194 10*3/uL (ref 150–450)
RBC: 3.46 x10E6/uL — ABNORMAL LOW (ref 3.77–5.28)
RDW: 12.7 % (ref 11.7–15.4)
WBC: 5.4 10*3/uL (ref 3.4–10.8)

## 2019-01-29 LAB — BASIC METABOLIC PANEL
BUN/Creatinine Ratio: 18 (ref 12–28)
BUN: 18 mg/dL (ref 8–27)
CO2: 25 mmol/L (ref 20–29)
Calcium: 10.1 mg/dL (ref 8.7–10.3)
Chloride: 98 mmol/L (ref 96–106)
Creatinine, Ser: 1.01 mg/dL — ABNORMAL HIGH (ref 0.57–1.00)
GFR calc Af Amer: 59 mL/min/{1.73_m2} — ABNORMAL LOW (ref >59)
GFR calc non Af Amer: 52 mL/min/{1.73_m2} — ABNORMAL LOW (ref >59)
Glucose: 79 mg/dL (ref 65–99)
Potassium: 4.1 mmol/L (ref 3.5–5.2)
Sodium: 138 mmol/L (ref 134–144)

## 2019-01-29 LAB — HEPATIC FUNCTION PANEL
ALT: 11 IU/L (ref 0–32)
AST: 24 IU/L (ref 0–40)
Albumin: 4.7 g/dL — ABNORMAL HIGH (ref 3.6–4.6)
Alkaline Phosphatase: 48 IU/L (ref 39–117)
Bilirubin Total: 0.4 mg/dL (ref 0.0–1.2)
Bilirubin, Direct: 0.15 mg/dL (ref 0.00–0.40)
Total Protein: 7.6 g/dL (ref 6.0–8.5)

## 2019-01-29 LAB — LIPID PANEL
Chol/HDL Ratio: 2.5 ratio (ref 0.0–4.4)
Cholesterol, Total: 131 mg/dL (ref 100–199)
HDL: 52 mg/dL (ref >39)
LDL Chol Calc (NIH): 64 mg/dL (ref 0–99)
Triglycerides: 78 mg/dL (ref 0–149)
VLDL Cholesterol Cal: 15 mg/dL (ref 5–40)

## 2019-01-29 MED ORDER — BENAZEPRIL HCL 20 MG PO TABS: 20.0000 mg | ORAL_TABLET | Freq: Two times a day (BID) | ORAL | 3 refills | Status: DC

## 2019-01-29 NOTE — Patient Instructions (Addendum)
After Visit Summary:  We will be checking the following labs today - BMET, CBC HPF and lipids   Medication Instructions:    Continue with your current medicines. BUT  STOP Bystolic  Increase the Lotensin (Benazepril) to twice a day - morning and night   If you need a refill on your cardiac medications before your next appointment, please call your pharmacy.     Testing/Procedures To Be Arranged:  N/A  Follow-Up:   See me in about a month    At [facility], you and your health needs are our priority.  As part of our continuing mission to provide you with exceptional heart care, we have created designated Provider Care Teams.  These Care Teams include your primary Cardiologist (physician) and Advanced Practice Providers (APPs -  Physician Assistants and Nurse Practitioners) who all work together to provide you with the care you need, when you need it.  Special Instructions:  . Stay safe, stay home, wash your hands for at least 20 seconds and wear a mask when out in public.  . It was good to talk with you today.  . Remember - get up and walk with every commercial . Ok to use a cane . Ok to get the shingles vaccine   Call the [place] office at [phone] if you have any questions, problems or concerns.

## 2019-02-11 ENCOUNTER — Other Ambulatory Visit: Payer: Self-pay

## 2019-02-11 ENCOUNTER — Telehealth: Payer: Self-pay | Admitting: Endocrinology

## 2019-02-11 MED ORDER — GLUCOSE BLOOD VI STRP: 1.0000 | ORAL_STRIP | 1 refills | Status: DC | PRN

## 2019-02-11 NOTE — Telephone Encounter (Signed)
Rx sent 

## 2019-02-11 NOTE — Telephone Encounter (Signed)
MEDICATION: One Touch Verio Test Strips  PHARMACY:  [facility] on [address]  IS THIS A 90 DAY SUPPLY :  WANTS 400 strips - states per insurance ok to get up to 400 at a refill  IS PATIENT OUT OF MEDICATION:  IF NOT; HOW MUCH IS LEFT:   LAST APPOINTMENT DATE: @8 /17/2020  NEXT APPOINTMENT DATE:@10 /27/2020  DO WE HAVE YOUR PERMISSION TO LEAVE A DETAILED MESSAGE: YES  OTHER COMMENTS:  WANTS 400 strips - states per insurance ok to get up to 400 at a refill. States to call her at [phone] if clarification is needed regarding test strip amounts    **Let patient know to contact pharmacy at the end of the day to make sure medication is ready. **  ** Please notify patient to allow 48-72 hours to process**  **Encourage patient to contact the pharmacy for refills or they can request refills through [facility]**

## 2019-02-12 ENCOUNTER — Other Ambulatory Visit: Payer: Self-pay

## 2019-02-12 MED ORDER — GLUCOSE BLOOD VI STRP: 1.0000 | ORAL_STRIP | 1 refills | Status: DC | PRN

## 2019-02-13 ENCOUNTER — Other Ambulatory Visit: Payer: Self-pay

## 2019-02-13 MED ORDER — ACCU-CHEK FASTCLIX LANCETS MISC: 3 refills | Status: AC

## 2019-02-13 MED ORDER — GLUCOSE BLOOD VI STRP: ORAL_STRIP | 3 refills | Status: DC

## 2019-02-13 MED ORDER — ACCU-CHEK GUIDE ME W/DEVICE KIT: 1.0000 | PACK | Freq: Four times a day (QID) | 0 refills | Status: DC

## 2019-02-14 ENCOUNTER — Other Ambulatory Visit: Payer: Self-pay | Admitting: Nurse Practitioner

## 2019-02-19 NOTE — Progress Notes (Signed)
CARDIOLOGY OFFICE NOTE  Date:  02/24/2019    [name] Date of Birth: 1933/03/25 Medical Record #[id number]  PCP:  Rogers Blocker, MD  Cardiologist:  Servando Snare Nahser    Chief Complaint  Patient presents with   Follow-up    History of Present Illness: [name] is a 85 y.o. female who presents today for a one month check. Seen for Dr. Acie Fredrickson. She primarily follows with me.   She has a history of DM, prior AVR and CABG x 2 per EBG back in 2014 - this was with a LIMA to the LAD and SVG to the LCX,with a pericardial Edwards tissue valve #21 mm. Did have post op atrial fib - was on amiodarone for a short time. Other issues include HLD, HTN, DM, depression and hypothyroidism. Myoview ok from 06/2013. Last echo 12/2014.   I saw her back in September of 2016 for a work in visit - she was short of breath. Labs looked ok.  Echo updated - did have elevated filling pressures but otherwise unchanged. Prosthetic AV ok. Normal EF and had grade 1 diastolic dysfunction. I wanted to put her on low dose Lasix but she did not wish to proceed due to having frequent urination when on Invokana.   Seen in November of 2016 by me and she was doing ok- not really clear as to why she had improved but she had. She had talked with Dr. Dwyane Dee and he also encouraged prn diuretic. She was agreeable to trying. Seen several times back since- BP still low and Plendil had been stopped altogether. Shestrugglesonlosing weight.Does have some chronic fatigue issues.She has been quite depressed over the past several times I have seen her - to the point that she has become more inactive - I actually was able to gether on low dose SSRIin the past which has helped her considerably - but she has tended to stop taking.   We did a telehealth visit back in June - she was doing ok - watching too much TV. Had been put on Bystolic for her BP. I then saw her last month - very depressed with the pandemic.  Gained weight. Watching TV most of th day. On 2 beta blockers - I stopped her Bystolic.   The patient does not have symptoms concerning for COVID-19 infection (fever, chills, cough, or new shortness of breath).   Comes in today. Here alone. She is doing better. Seems brighter and more upbeat today. She notes a difference with being off Bystolic. She also had a family member pay for a new roof for her house - I think this was probably the culprit all along. No chest pain. Breathing is good. She is down a few pounds. She is happy.   Past Medical History:  Diagnosis Date   Aortic valve disorders    S/P AVR 06/2012   Arthritis    Asthma    AS CHILD    Depression    Diabetes mellitus    Heart murmur    Hyperlipidemia    Hypertension    Hypothyroidism    Ischemic heart disease    with prior CABG x 2 in 2014 along with AVR   Lumbago    Shortness of breath    Thyroid disease     Past Surgical History:  Procedure Laterality Date   AORTIC VALVE REPLACEMENT N/A 06/25/2012   Procedure: AORTIC VALVE REPLACEMENT (AVR);  Surgeon: Grace Isaac, MD;  Location: [facility]  OR;  Service: Open Heart Surgery;  Laterality: N/A;   BREAST BIOPSY Right 08/2016   benign   BREAST SURGERY     LT BREAST BX BENIGN   CORONARY ARTERY BYPASS GRAFT N/A 06/25/2012   Procedure: Coronary artery bypass graft  times two using left internal mammary artery and right leg greater saphenous vein;  Surgeon: Grace Isaac, MD;  Location: [facility];  Service: Open Heart Surgery;  Laterality: N/A;   INTRAOPERATIVE TRANSESOPHAGEAL ECHOCARDIOGRAM N/A 06/25/2012   Procedure: INTRAOPERATIVE TRANSESOPHAGEAL ECHOCARDIOGRAM;  Surgeon: Grace Isaac, MD;  Location: [facility];  Service: Open Heart Surgery;  Laterality: N/A;   TUBAL LIGATION       Medications: Current Meds  Medication Sig   Accu-Chek FastClix Lancets MISC Use Accu Chek Fastclix to check blood sugar 4 times daily.   aspirin EC 81 MG tablet Take 1  tablet (81 mg total) by mouth daily.   benazepril (LOTENSIN) 20 MG tablet Take 1 tablet (20 mg total) by mouth 2 (two) times daily.   Blood Glucose Monitoring Suppl (ACCU-CHEK GUIDE ME) w/Device KIT 1 each by Does not apply route 4 (four) times daily. Use accu chek guide me device to check blood sugar 4 times daily.   calcium carbonate (TUMS) 500 MG chewable tablet Chew 1 tablet by mouth daily as needed for heartburn (indigestion).   carvedilol (COREG) 12.5 MG tablet TAKE 1 TABLET BY MOUTH TWICE A DAY   cholecalciferol (VITAMIN D) 1000 UNITS tablet Take 1,000 Units by mouth daily.   Coenzyme Q10 (CO Q-10) 100 MG CAPS Take 1 capsule by mouth 2 (two) times daily.   escitalopram (LEXAPRO) 10 MG tablet Take 1 tablet (10 mg total) by mouth daily.   furosemide (LASIX) 20 MG tablet Take 1 tablet (20 mg total) by mouth daily as needed for fluid or edema (or weight gain).   glucose blood test strip Use Accu Chek Guide test strips as instructed to check blood sugar 4 times daily.   HUMALOG KWIKPEN 100 UNIT/ML KiwkPen INJECT 6 TO 8 UNITS INTO THE SKIN TWICE DAILY   Insulin Glargine, 2 Unit Dial, (TOUJEO MAX SOLOSTAR) 300 UNIT/ML SOPN Inject 22 Units into the skin daily. Inject 22 units under the skin once daily.   levothyroxine (SYNTHROID) 75 MCG tablet TAKE 1 TABLET BY MOUTH EVERY DAY   memantine (NAMENDA) 5 MG tablet Take 5 mg by mouth at bedtime.   metFORMIN (GLUCOPHAGE-XR) 750 MG 24 hr tablet Take 1 tablet by mouth once daily. (Patient taking differently: Take 750 mg by mouth daily. Take 1 tablet by mouth once daily.)   Multiple Vitamins-Minerals (MULTIVITAMINS THER. W/MINERALS) TABS Take 1 tablet by mouth daily.   rosuvastatin (CRESTOR) 20 MG tablet TAKE 1 TABLET(20 MG) BY MOUTH DAILY   vitamin B-12 (CYANOCOBALAMIN) 100 MCG tablet Take 100 mcg by mouth daily.     Allergies: No Known Allergies  Social History: The patient  reports that she has never smoked. She has never used  smokeless tobacco. She reports that she does not drink alcohol or use drugs.   Family History: The patient's family history includes Heart disease in her father.   Review of Systems: Please see the history of present illness.   All other systems are reviewed and negative.   Physical Exam: VS:  BP 140/80    Pulse 77    Ht 5' 4"  (1.626 m)    Wt 176 lb 6.4 oz (80 kg)    SpO2 99%  BMI 30.28 kg/m  .  BMI Body mass index is 30.28 kg/m.  Wt Readings from Last 3 Encounters:  02/24/19 176 lb 6.4 oz (80 kg)  01/29/19 179 lb (81.2 kg)  11/28/18 177 lb (80.3 kg)    General: Pleasant. Well developed, well nourished and in no acute distress.   HEENT: Normal.  Neck: Supple, no JVD, carotid bruits, or masses noted.  Cardiac: Regular rate and rhythm. No murmurs, rubs, or gallops. No edema.  Respiratory:  Lungs are clear to auscultation bilaterally with normal work of breathing.  GI: Soft and nontender.  MS: No deformity or atrophy. Gait and ROM intact.  Skin: Warm and dry. Color is normal.  Neuro:  Strength and sensation are intact and no gross focal deficits noted.  Psych: Alert, appropriate and with normal affect.   LABORATORY DATA:  EKG:  EKG is not ordered today.  Lab Results  Component Value Date   WBC 5.4 01/29/2019   HGB 10.8 (L) 01/29/2019   HCT 32.7 (L) 01/29/2019   PLT 194 01/29/2019   GLUCOSE 79 01/29/2019   CHOL 131 01/29/2019   TRIG 78 01/29/2019   HDL 52 01/29/2019   LDLDIRECT 102.8 11/28/2013   LDLCALC 64 01/29/2019   ALT 11 01/29/2019   AST 24 01/29/2019   NA 138 01/29/2019   K 4.1 01/29/2019   CL 98 01/29/2019   CREATININE 1.01 (H) 01/29/2019   BUN 18 01/29/2019   CO2 25 01/29/2019   TSH 0.96 08/19/2018   INR 1.57 (H) 06/25/2012   HGBA1C 7.6 (H) 11/25/2018   MICROALBUR 0.7 03/20/2018     BNP (last 3 results) No results for input(s): BNP in the last 8760 hours.  ProBNP (last 3 results) No results for input(s): PROBNP in the last 8760  hours.   Other Studies Reviewed Today:  EchoStudy Conclusions8/2018  - Left ventricle: The cavity size was normal. Systolic function was normal. The estimated ejection fraction was in the range of 55% to 60%. Wall motion was normal; there were no regional wall motion abnormalities. There was an increased relative contribution of atrial contraction to ventricular filling. Doppler parameters are consistent with abnormal left ventricular relaxation (grade 1 diastolic dysfunction). - Aortic valve: A pericardial bioprosthesis was present and functioning normally. Mean gradient (S): 9 mm Hg. Peak gradient (S): 17 mm Hg. - Mitral valve: Calcified annulus. Mildly thickened leaflets . There was mild to moderate regurgitation. - Left atrium: The atrium was mildly dilated. - Atrial septum: There was increased thickness of the septum, consistent with lipomatous hypertrophy. - Pulmonary arteries: PA peak pressure: 42 mm Hg (S).  Impressions:  - The right ventricular systolic pressure was increased consistent with moderate pulmonary hypertension.   Myoview Impression from 06/2013 Exercise Capacity: Lexiscan with no exercise. BP Response: Normal blood pressure response. Clinical Symptoms: No significant symptoms noted. ECG Impression: There are scattered PVCs. Comparison with Prior Nuclear Study: No previous nuclear study performed  Overall Impression: Low risk stress nuclear study small fixed distal anterior artifact which could be breast attenuation or other artifact.  LV Ejection Fraction: 66%. LV Wall Motion: Normal Wall Motion  [name], MD, [facility]   ASSESSMENT & PLAN:   1.CAD with remote CABG/AVR form 2014 - last Myoview from 2015 and was stable - she has no active symptoms. She is managed medically. Doing well. No changes made today.   2. AS - prior AVR - last echo from August of 2018 was stable - no cardinal symptoms. Will  consider updating in August of 2021.   3 Depression -  she is better - Bystolic has been stopped. She did not restart the Lexapro - she does not feel like she needs this at time. I reminded her that if she takes this - it is a daily drug and not a "prn" med - and to not stop abruptly - for now seems like she is going to hold off. I suspect her family member taking care of paying for her roof has caused considerable improvement.   4. HTN = BP is fine with her current regimen.   5. HLD - on statin - labs from last month noted.   6. Obesity - always a struggle for her. The pandemic has made this worse - she is encouraged to get up and move as much as she can at home.   7. Chronic anemia - stable.   8. DM - per PCP - not discussed.   9. COVID-19 Education: The signs and symptoms of COVID-19 were discussed with the patient and how to seek care for testing (follow up with PCP or arrange E-visit).  The importance of social distancing, staying at home, hand hygiene and wearing a mask when out in public were discussed today.  Current medicines are reviewed with the patient today.  The patient does not have concerns regarding medicines other than what has been noted above.  The following changes have been made:  See above.  Labs/ tests ordered today include:   No orders of the defined types were placed in this encounter.    Disposition:   FU with me in 3 months.   Patient is agreeable to this plan and will call if any problems develop in the interim.   SignedTruitt Merle, NP  02/24/2019 10:30 AM  [facility] [address] [place], [place]  [address] Phone: [phone] Fax: [phone]

## 2019-02-24 ENCOUNTER — Encounter: Payer: Self-pay | Admitting: Nurse Practitioner

## 2019-02-24 ENCOUNTER — Other Ambulatory Visit: Payer: Self-pay

## 2019-02-24 ENCOUNTER — Ambulatory Visit (INDEPENDENT_AMBULATORY_CARE_PROVIDER_SITE_OTHER): Payer: Medicare Other | Admitting: Nurse Practitioner

## 2019-02-24 VITALS — BP 140/80 | HR 77 | Ht 64.0 in | Wt 176.4 lb

## 2019-02-24 DIAGNOSIS — Z951 Presence of aortocoronary bypass graft: Secondary | ICD-10-CM | POA: Diagnosis not present

## 2019-02-24 DIAGNOSIS — E7849 Other hyperlipidemia: Secondary | ICD-10-CM

## 2019-02-24 DIAGNOSIS — I10 Essential (primary) hypertension: Secondary | ICD-10-CM | POA: Diagnosis not present

## 2019-02-24 DIAGNOSIS — I259 Chronic ischemic heart disease, unspecified: Secondary | ICD-10-CM | POA: Diagnosis not present

## 2019-02-24 DIAGNOSIS — Z952 Presence of prosthetic heart valve: Secondary | ICD-10-CM

## 2019-02-24 DIAGNOSIS — F329 Major depressive disorder, single episode, unspecified: Secondary | ICD-10-CM

## 2019-02-24 DIAGNOSIS — Z7189 Other specified counseling: Secondary | ICD-10-CM

## 2019-02-24 DIAGNOSIS — F32A Depression, unspecified: Secondary | ICD-10-CM

## 2019-02-24 NOTE — Patient Instructions (Addendum)
After Visit Summary:  We will be checking the following labs today - NONE   Medication Instructions:    Continue with your current medicines.    If you need a refill on your cardiac medications before your next appointment, please call your pharmacy.     Testing/Procedures To Be Arranged:  N/A  Follow-Up:   See me in about 3 months    At [facility], you and your health needs are our priority.  As part of our continuing mission to provide you with exceptional heart care, we have created designated Provider Care Teams.  These Care Teams include your primary Cardiologist (physician) and Advanced Practice Providers (APPs -  Physician Assistants and Nurse Practitioners) who all work together to provide you with the care you need, when you need it.  Special Instructions:  . Stay safe, stay home, wash your hands for at least 20 seconds and wear a mask when out in public.  . It was good to talk with you today.  . Keep up the good work!!   Call the [facility] office at [phone] if you have any questions, problems or concerns.       

## 2019-02-25 ENCOUNTER — Other Ambulatory Visit (INDEPENDENT_AMBULATORY_CARE_PROVIDER_SITE_OTHER): Payer: Medicare Other

## 2019-02-25 DIAGNOSIS — E1165 Type 2 diabetes mellitus with hyperglycemia: Secondary | ICD-10-CM | POA: Diagnosis not present

## 2019-02-25 DIAGNOSIS — Z794 Long term (current) use of insulin: Secondary | ICD-10-CM | POA: Diagnosis not present

## 2019-02-25 LAB — COMPREHENSIVE METABOLIC PANEL
ALT: 11 U/L (ref 0–35)
AST: 25 U/L (ref 0–37)
Albumin: 4.3 g/dL (ref 3.5–5.2)
Alkaline Phosphatase: 41 U/L (ref 39–117)
BUN: 16 mg/dL (ref 6–23)
CO2: 33 mEq/L — ABNORMAL HIGH (ref 19–32)
Calcium: 9.8 mg/dL (ref 8.4–10.5)
Chloride: 101 mEq/L (ref 96–112)
Creatinine, Ser: 0.85 mg/dL (ref 0.40–1.20)
GFR: 77.11 mL/min (ref >60.00)
Glucose, Bld: 101 mg/dL — ABNORMAL HIGH (ref 70–99)
Potassium: 3.8 mEq/L (ref 3.5–5.1)
Sodium: 138 mEq/L (ref 135–145)
Total Bilirubin: 0.5 mg/dL (ref 0.2–1.2)
Total Protein: 7.7 g/dL (ref 6.0–8.3)

## 2019-02-25 LAB — MICROALBUMIN / CREATININE URINE RATIO
Creatinine,U: 68.4 mg/dL
Microalb Creat Ratio: 42 mg/g — ABNORMAL HIGH (ref 0.0–30.0)
Microalb, Ur: 28.7 mg/dL — ABNORMAL HIGH (ref 0.0–1.9)

## 2019-02-25 LAB — HEMOGLOBIN A1C: Hgb A1c MFr Bld: 7.9 % — ABNORMAL HIGH (ref 4.6–6.5)

## 2019-02-26 ENCOUNTER — Other Ambulatory Visit: Payer: Self-pay

## 2019-02-27 LAB — T4, FREE: Free T4: 1.03 ng/dL (ref 0.60–1.60)

## 2019-02-27 LAB — TSH: TSH: 1.38 u[IU]/mL (ref 0.35–4.50)

## 2019-02-28 ENCOUNTER — Encounter: Payer: Self-pay | Admitting: Podiatry

## 2019-02-28 ENCOUNTER — Ambulatory Visit (INDEPENDENT_AMBULATORY_CARE_PROVIDER_SITE_OTHER): Payer: Medicare Other | Admitting: Podiatry

## 2019-02-28 ENCOUNTER — Ambulatory Visit: Payer: Medicare Other | Admitting: Podiatry

## 2019-02-28 ENCOUNTER — Encounter: Payer: Self-pay | Admitting: Endocrinology

## 2019-02-28 ENCOUNTER — Other Ambulatory Visit: Payer: Self-pay

## 2019-02-28 ENCOUNTER — Ambulatory Visit (INDEPENDENT_AMBULATORY_CARE_PROVIDER_SITE_OTHER): Payer: Medicare Other | Admitting: Endocrinology

## 2019-02-28 VITALS — BP 156/82 | HR 76 | Ht 64.0 in | Wt 176.0 lb

## 2019-02-28 DIAGNOSIS — L84 Corns and callosities: Secondary | ICD-10-CM | POA: Diagnosis not present

## 2019-02-28 DIAGNOSIS — Z794 Long term (current) use of insulin: Secondary | ICD-10-CM

## 2019-02-28 DIAGNOSIS — E7800 Pure hypercholesterolemia, unspecified: Secondary | ICD-10-CM

## 2019-02-28 DIAGNOSIS — E1165 Type 2 diabetes mellitus with hyperglycemia: Secondary | ICD-10-CM

## 2019-02-28 DIAGNOSIS — M79676 Pain in unspecified toe(s): Secondary | ICD-10-CM

## 2019-02-28 DIAGNOSIS — E063 Autoimmune thyroiditis: Secondary | ICD-10-CM

## 2019-02-28 DIAGNOSIS — I259 Chronic ischemic heart disease, unspecified: Secondary | ICD-10-CM

## 2019-02-28 DIAGNOSIS — M2041 Other hammer toe(s) (acquired), right foot: Secondary | ICD-10-CM

## 2019-02-28 DIAGNOSIS — B351 Tinea unguium: Secondary | ICD-10-CM

## 2019-02-28 DIAGNOSIS — I10 Essential (primary) hypertension: Secondary | ICD-10-CM

## 2019-02-28 LAB — POCT GLYCOSYLATED HEMOGLOBIN (HGB A1C): Hemoglobin A1C: 7.3 % — AB (ref 4.0–5.6)

## 2019-02-28 NOTE — Progress Notes (Signed)
[name] 85 y.o.            Reason for Appointment: Endocrinology follow-up   History of Present Illness   Diagnosis: Type 2 DIABETES MELITUS, date of diagnosis:   2000   She has been on insulin since 2004 with difficulty controlling adequately because of compliance with diet and variability in blood sugars. Her previous records are not available at present, however she has been on basal insulin with mealtime coverage since at least 2007 She has been tried on Byetta previously but did not tolerate this well She tried Victoza but didn't not benefit from Victoza 0.6 dosage; had nausea from 1.2 mg; did not continue this because of this side effect She also was given a trial of the V-go pump but she subjectively did not like this  RECENT history:    INSULIN: Toujeo 22 units in morning, HUMALOG 6 units Acb, 6/8 lunch and 4 at supper    Oral hypoglycemic drugs: Metformin ER 750 mg in pm and Invokamet 50/1000, half tablet at variable times   A1c is usually around 8%, now 7.3  She has the following blood sugar patterns and problems identified  She has again not been taking her Humalog before eating lunch despite consistently asking her to do this  When asked if she thinks she is taking it about 45 minutes later but likely not till dinnertime since practically all her sugars at dinnertime are very high  Also she is again concerned about the cost of her medications and likely is not taking Humalog daily  With this her blood sugars before dinnertime are generally markedly increased  Not able to assess her blood sugars AFTER breakfast also with low sugars done at that time  She is her Invokamet at variable times when she thinks about it and sometimes in the evenings when the blood sugars are higher  She has on a couple of occasions taken Humalog at suppertime without getting much carbohydrate and cause hypoglycemia  Her FASTING readings are somewhat variable but averaging about  the same as before and still 130  No overnight hypoglycemia with lowest blood sugar 87 last week  She is not motivated to exercise and her weight is slightly higher  She does walk a little but not consistently  Weight is about the same      .         Monitors blood glucose:  about 2-3 times daily     Glucometer: One Touch Verio.          Blood Glucose readings from download:    PRE-MEAL Fasting Lunch Dinner Bedtime Overall  Glucose range:  87-184   152-361    Mean/median:  124   227  155   POST-MEAL PC Breakfast PC Lunch PC Dinner  Glucose range:    39-490  Mean/median:      Previous readings:  PRE-MEAL Fasting Lunch Dinner Bedtime Overall  Glucose range:  83-169  296     Mean/median:  127    164   POST-MEAL PC Breakfast PC Lunch PC Dinner  Glucose range:   212-276  116-240  Mean/median:         Meals: 3 meals per day:  breakfast is At 7 am;  lunch 12-2 pm, dinner 5 pm Intake is quite variable at different meals, usually lighter supper    Wt Readings from Last 3 Encounters:  02/28/19 176 lb (79.8 kg)  02/24/19 176 lb 6.4 oz (80 kg)  01/29/19 179 lb (81.2 kg)    Lab Results  Component Value Date   HGBA1C 7.3 (A) 02/28/2019   HGBA1C 7.9 (H) 02/25/2019   HGBA1C 7.6 (H) 11/25/2018   Lab Results  Component Value Date   MICROALBUR 28.7 (H) 02/25/2019   LDLCALC 64 01/29/2019   CREATININE 0.85 02/25/2019    Office Visit on 02/28/2019  Component Date Value Ref Range Status  . Hemoglobin A1C 02/28/2019 7.3* 4.0 - 5.6 % Final  Lab on 02/25/2019  Component Date Value Ref Range Status  . Free T4 02/25/2019 1.03  0.60 - 1.60 ng/dL Final   Comment: Specimens from patients who are undergoing biotin therapy and /or ingesting biotin supplements may contain high levels of biotin.  The higher biotin concentration in these specimens interferes with this Free T4 assay.  Specimens that contain high levels  of biotin may cause false high results for this Free T4 assay.   Please interpret results in light of the total clinical presentation of the patient.    Marland Kitchen TSH 02/25/2019 1.38  0.35 - 4.50 uIU/mL Final  . Microalb, Ur 02/25/2019 28.7* 0.0 - 1.9 mg/dL Final  . Creatinine,U 02/25/2019 68.4  mg/dL Final  . Microalb Creat Ratio 02/25/2019 42.0* 0.0 - 30.0 mg/g Final  . Sodium 02/25/2019 138  135 - 145 mEq/L Final  . Potassium 02/25/2019 3.8  3.5 - 5.1 mEq/L Final  . Chloride 02/25/2019 101  96 - 112 mEq/L Final  . CO2 02/25/2019 33* 19 - 32 mEq/L Final  . Glucose, Bld 02/25/2019 101* 70 - 99 mg/dL Final  . BUN 02/25/2019 16  6 - 23 mg/dL Final  . Creatinine, Ser 02/25/2019 0.85  0.40 - 1.20 mg/dL Final  . Total Bilirubin 02/25/2019 0.5  0.2 - 1.2 mg/dL Final  . Alkaline Phosphatase 02/25/2019 41  39 - 117 U/L Final  . AST 02/25/2019 25  0 - 37 U/L Final  . ALT 02/25/2019 11  0 - 35 U/L Final  . Total Protein 02/25/2019 7.7  6.0 - 8.3 g/dL Final  . Albumin 02/25/2019 4.3  3.5 - 5.2 g/dL Final  . Calcium 02/25/2019 9.8  8.4 - 10.5 mg/dL Final  . GFR 02/25/2019 77.11  >60.00 mL/min Final  . Hgb A1c MFr Bld 02/25/2019 7.9* 4.6 - 6.5 % Final   Glycemic Control Guidelines for People with Diabetes:Non Diabetic:  <6%Goal of Therapy: <7%Additional Action Suggested:  >8%      Allergies as of 02/28/2019   No Known Allergies     Medication List       Accurate as of February 28, 2019 11:59 PM. If you have any questions, ask your nurse or doctor.        Accu-Chek FastClix Lancets Misc Use Accu Chek Fastclix to check blood sugar 4 times daily.   Accu-Chek Guide Me w/Device Kit 1 each by Does not apply route 4 (four) times daily. Use accu chek guide me device to check blood sugar 4 times daily.   aspirin EC 81 MG tablet Take 1 tablet (81 mg total) by mouth daily.   benazepril 20 MG tablet Commonly known as: LOTENSIN Take 1 tablet (20 mg total) by mouth 2 (two) times daily.   carvedilol 12.5 MG tablet Commonly known as: COREG TAKE 1 TABLET BY MOUTH  TWICE A DAY   cholecalciferol 1000 units tablet Commonly known as: VITAMIN D Take 1,000 Units by mouth daily.   Co Q-10 100 MG Caps Take 1 capsule by mouth 2 (  two) times daily.   escitalopram 10 MG tablet Commonly known as: Lexapro Take 1 tablet (10 mg total) by mouth daily.   furosemide 20 MG tablet Commonly known as: LASIX Take 1 tablet (20 mg total) by mouth daily as needed for fluid or edema (or weight gain).   glucose blood test strip Use Accu Chek Guide test strips as instructed to check blood sugar 4 times daily.   HumaLOG KwikPen 100 UNIT/ML KwikPen Generic drug: insulin lispro INJECT 6 TO 8 UNITS INTO THE SKIN TWICE DAILY   levothyroxine 75 MCG tablet Commonly known as: SYNTHROID TAKE 1 TABLET BY MOUTH EVERY DAY   memantine 5 MG tablet Commonly known as: NAMENDA Take 5 mg by mouth at bedtime.   metFORMIN 750 MG 24 hr tablet Commonly known as: GLUCOPHAGE-XR Take 1 tablet by mouth once daily. What changed:   how much to take  how to take this  when to take this   multivitamins ther. w/minerals Tabs tablet Take 1 tablet by mouth daily.   rosuvastatin 20 MG tablet Commonly known as: CRESTOR TAKE 1 TABLET(20 MG) BY MOUTH DAILY   Toujeo Max SoloStar 300 UNIT/ML Sopn Generic drug: Insulin Glargine (2 Unit Dial) Inject 22 Units into the skin daily. Inject 22 units under the skin once daily.   Tums 500 MG chewable tablet Generic drug: calcium carbonate Chew 1 tablet by mouth daily as needed for heartburn (indigestion).   vitamin B-12 100 MCG tablet Commonly known as: CYANOCOBALAMIN Take 100 mcg by mouth daily.       Allergies: No Known Allergies  Past Medical History:  Diagnosis Date  . Aortic valve disorders    S/P AVR 06/2012  . Arthritis   . Asthma    AS CHILD   . Depression   . Diabetes mellitus   . Heart murmur   . Hyperlipidemia   . Hypertension   . Hypothyroidism   . Ischemic heart disease    with prior CABG x 2 in 2014 along with  AVR  . Lumbago   . Shortness of breath   . Thyroid disease     Past Surgical History:  Procedure Laterality Date  . AORTIC VALVE REPLACEMENT N/A 06/25/2012   Procedure: AORTIC VALVE REPLACEMENT (AVR);  Surgeon: Grace Isaac, MD;  Location: [place];  Service: Open Heart Surgery;  Laterality: N/A;  . BREAST BIOPSY Right 08/2016   benign  . BREAST SURGERY     LT BREAST BX BENIGN  . CORONARY ARTERY BYPASS GRAFT N/A 06/25/2012   Procedure: Coronary artery bypass graft  times two using left internal mammary artery and right leg greater saphenous vein;  Surgeon: Grace Isaac, MD;  Location: [place] [facility];  Service: Open Heart Surgery;  Laterality: N/A;  . INTRAOPERATIVE TRANSESOPHAGEAL ECHOCARDIOGRAM N/A 06/25/2012   Procedure: INTRAOPERATIVE TRANSESOPHAGEAL ECHOCARDIOGRAM;  Surgeon: Grace Isaac, MD;  Location: [place];  Service: Open Heart Surgery;  Laterality: N/A;  . TUBAL LIGATION      Family History  Problem Relation Age of Onset  . Heart disease Father     Social History:  reports that she has never smoked. She has never used smokeless tobacco. She reports that she does not drink alcohol or use drugs.  Review of Systems:   HYPERTENSION: Blood pressure is followed by PCP and cardiologist Treatment regimen includes benazepril and carvedilol She periodically will check blood pressure at home  BP Readings from Last 3 Encounters:  02/28/19 (!) 156/82  02/24/19 140/80  01/29/19 (!) 160/82     HYPOTHYROIDISM: She has had long-standing hypothyroidism  She is compliant with her levothyroxine 75 g dose She feels well  TSH has been recently in the normal range   Lab Results  Component Value Date   TSH 1.38 02/25/2019   TSH 0.96 08/19/2018   TSH 3.33 03/20/2018   FREET4 1.03 02/25/2019   FREET4 1.02 08/19/2018   FREET4 0.92 03/20/2018    HYPERLIPIDEMIA: The lipid abnormality consists of elevated LDL; has been  controlled with Crestor 20 mg LDL is below  100  Lab  Results  Component Value Date   CHOL 131 01/29/2019   HDL 52 01/29/2019   LDLCALC 64 01/29/2019   LDLDIRECT 102.8 11/28/2013   TRIG 78 01/29/2019   CHOLHDL 2.5 01/29/2019  '    She does see a podiatrist regularly for onychomycosis  Last foot exam was in 07/2018 with podiatrist: Diabetic foot exam shows normal monofilament sensation in the toes and plantar surfaces, no skin lesions or ulcers on the feet and normal pedal pulses  DEPRESSION: She may still be somewhat depressed, was given Lexapro on her visit in July but unlikely she is taking this from her PCP now    Examination:   BP (!) 156/82 (BP Location: Right Arm, Patient Position: Sitting, Cuff Size: Large)   Pulse 76   Ht 5' 4" (1.626 m)   Wt 176 lb (79.8 kg)   SpO2 97%   BMI 30.21 kg/m   Body mass index is 30.21 kg/m.    ASSESSMENT/ PLAN:   Diabetes type 2, uncontrolled   See history of present illness for a discussion of the blood sugar patterns, recent management and problems identified  A1c 7.3  Even though her A1c is slightly better her blood sugars are averaging well over 200 at dinnertime from not covering her lunch or taking the postprandial insulin tonight May also need a higher dose of Humalog at lunchtime than 6 units but difficult to assess this Not clear if her memory issues are affecting her ability to take her mealtime insulin on time Also has taken excessive amounts of Humalog at dinnertime causing hypoglycemia when she does not eat enough or any carbohydrate but is only concerned about her high sugars  Recommendations:  She will try to take her Invokana before lunch daily to help with postprandial hyperglycemia at her peak times, discussed that this is safe and will be helpful with long-term benefits despite her main continuing to doubt its safety  Discussed her written instructions today for all the changes  She will take Humalog when she starts to eat her meal at lunchtime and if sugars are  subsequently still over 180-200 will need to take 8 units, this may depend on the size of the meal  She will not change her Toujeo  Sample of Humalog given  She will avoid Humalog at dinnertime if not eating any carbohydrate and not take more than 2 or 3 units to cover any high readings  Walk more regularly  Avoid high carbohydrate or high fat foods at lunch  HYPERTENSION: Variably controlled, followed by PCP and cardiologist  Renal function normal but microalbumin slightly high Would benefit from increasing benazepril, will defer to cardiologist  DEPRESSION: She has recurrent depression and needs to be monitored by her PCP regularly for this along with any memory issues  Hypothyroidism: Treated with 75 mcg and TSH is normal  Counseling time on subjects discussed in assessment and plan sections is  over 50% of today's 25 minute visit    Patient Instructions  Check blood sugar every other day in the morning before breakfast Check blood sugar at least every other day before lunch  VERY IMPORTANT: Take Invokana before lunch daily Also take HUMALOG at least 6 units before starting to eat lunch If blood sugars are still over 200 after lunch increase the lunchtime dose to 8 units  Do not take any Humalog at dinnertime if not eating any carbohydrate such as bread or starchy vegetables      Counseling time on subjects discussed in assessment and plan sections is over 50% of today's 25 minute visit    Elayne Snare 03/02/2019, 8:32 PM   Note: This office note was prepared with Dragon voice recognition system technology. Any transcriptional errors that result from this process are unintentional.

## 2019-02-28 NOTE — Progress Notes (Signed)
Complaint:  Visit Type: Patient returns to my office for continued preventative foot care services. Complaint: Patient states" my nails have grown long and thick and become painful to walk and wear shoes" Patient has been diagnosed with DM with no foot complications. The patient presents for preventative foot care services. No changes to ROS.  Patient has painful hammer toe second right foot.  Podiatric Exam: Vascular: dorsalis pedis are palpable bilateral.  Posterior tibial pulses are not palpable.Capillary return is immediate. Temperature gradient is WNL. Skin turgor WNL  Sensorium: Diminished  Semmes Weinstein monofilament test. Normal tactile sensation bilaterally. Nail Exam: Pt has thick disfigured discolored nails with subungual debris noted bilateral entire nail hallux through fifth toenails Ulcer Exam: There is no evidence of ulcer or pre-ulcerative changes or infection. Orthopedic Exam: Muscle tone and strength are WNL. No limitations in general ROM. No crepitus or effusions noted. Foot type and digits show no abnormalities. HAV  B/L.  Hammer toe second right. Skin: No Porokeratosis asymptomatic.. No infection or ulcers.  HD second toe right foot.  Diagnosis:  Onychomycosis, , Pain in right toe, pain in left toes  Treatment & Plan Procedures and Treatment: Consent by patient was obtained for treatment procedures.   Debridement of mycotic and hypertrophic toenails, 1 through 5 bilateral and clearing of subungual debris. No ulceration, no infection noted. Debride heloma durum second toe right foot.   Return Visit-Office Procedure: Patient instructed to return to the office for a follow up visit 3 months for continued evaluation and treatment.    Saliou Barnier DPM 

## 2019-02-28 NOTE — Patient Instructions (Signed)
Check blood sugar every other day in the morning before breakfast Check blood sugar at least every other day before lunch  VERY IMPORTANT: Take Invokana before lunch daily Also take HUMALOG at least 6 units before starting to eat lunch If blood sugars are still over 200 after lunch increase the lunchtime dose to 8 units  Do not take any Humalog at dinnertime if not eating any carbohydrate such as bread or starchy vegetables

## 2019-03-03 NOTE — Progress Notes (Signed)
Hi Dr. Dwyane Dee,  I did increase her ACE at our visit back in September.  Follow up BP was good.   [name]

## 2019-03-04 ENCOUNTER — Ambulatory Visit: Payer: Medicare Other | Admitting: Podiatry

## 2019-03-08 ENCOUNTER — Other Ambulatory Visit: Payer: Self-pay | Admitting: Endocrinology

## 2019-03-14 ENCOUNTER — Telehealth: Payer: Self-pay

## 2019-03-14 ENCOUNTER — Other Ambulatory Visit: Payer: Self-pay

## 2019-03-14 ENCOUNTER — Other Ambulatory Visit: Payer: Self-pay | Admitting: Endocrinology

## 2019-03-14 NOTE — Telephone Encounter (Signed)
This medication request was received once already from the pharmacy and refilled as requested earlier today. Sent regular Toujeo, not toujeo max because pt does not use at least 40 units per day, warranting the need for [employer].

## 2019-03-14 NOTE — Telephone Encounter (Signed)
MEDICATION: Insulin Glargine, 2 Unit Dial, (TOUJEO MAX SOLOSTAR) 300 UNIT/ML SOPN  PHARMACY:  [id number] - [place], [place] - [address] AT [place] OF [address]  IS THIS A 90 DAY SUPPLY : yes  IS PATIENT OUT OF MEDICATION: yes  IF NOT; HOW MUCH IS LEFT:   LAST APPOINTMENT DATE: @11 /13/2020  NEXT APPOINTMENT DATE:@12 /22/2020  DO WE HAVE YOUR PERMISSION TO LEAVE A DETAILED MESSAGE:  OTHER COMMENTS:    **Let patient know to contact pharmacy at the end of the day to make sure medication is ready. **  ** Please notify patient to allow 48-72 hours to process**  **Encourage patient to contact the pharmacy for refills or they can request refills through [facility]**

## 2019-03-31 ENCOUNTER — Other Ambulatory Visit: Payer: Self-pay | Admitting: Internal Medicine

## 2019-03-31 DIAGNOSIS — R921 Mammographic calcification found on diagnostic imaging of breast: Secondary | ICD-10-CM

## 2019-04-11 ENCOUNTER — Other Ambulatory Visit: Payer: Self-pay

## 2019-04-11 ENCOUNTER — Ambulatory Visit
Admission: RE | Admit: 2019-04-11 | Discharge: 2019-04-11 | Disposition: A | Discharge status: Home / Self Care | Payer: Medicare Other | Source: Ambulatory Visit | Attending: Internal Medicine | Admitting: Internal Medicine

## 2019-04-11 DIAGNOSIS — R921 Mammographic calcification found on diagnostic imaging of breast: Secondary | ICD-10-CM

## 2019-04-13 ENCOUNTER — Other Ambulatory Visit: Payer: Self-pay | Admitting: Endocrinology

## 2019-04-18 ENCOUNTER — Other Ambulatory Visit: Payer: Self-pay | Admitting: Endocrinology

## 2019-04-22 ENCOUNTER — Other Ambulatory Visit: Payer: Self-pay

## 2019-04-22 ENCOUNTER — Other Ambulatory Visit (INDEPENDENT_AMBULATORY_CARE_PROVIDER_SITE_OTHER): Payer: Medicare Other

## 2019-04-22 ENCOUNTER — Other Ambulatory Visit: Payer: Medicare Other

## 2019-04-22 DIAGNOSIS — E1165 Type 2 diabetes mellitus with hyperglycemia: Secondary | ICD-10-CM | POA: Diagnosis not present

## 2019-04-22 DIAGNOSIS — Z794 Long term (current) use of insulin: Secondary | ICD-10-CM | POA: Diagnosis not present

## 2019-04-22 DIAGNOSIS — E063 Autoimmune thyroiditis: Secondary | ICD-10-CM | POA: Diagnosis not present

## 2019-04-22 DIAGNOSIS — E7800 Pure hypercholesterolemia, unspecified: Secondary | ICD-10-CM

## 2019-04-22 LAB — COMPREHENSIVE METABOLIC PANEL
ALT: 12 U/L (ref 0–35)
AST: 22 U/L (ref 0–37)
Albumin: 4.4 g/dL (ref 3.5–5.2)
Alkaline Phosphatase: 43 U/L (ref 39–117)
BUN: 17 mg/dL (ref 6–23)
CO2: 29 mEq/L (ref 19–32)
Calcium: 9.9 mg/dL (ref 8.4–10.5)
Chloride: 98 mEq/L (ref 96–112)
Creatinine, Ser: 1.06 mg/dL (ref 0.40–1.20)
GFR: 59.75 mL/min — ABNORMAL LOW (ref >60.00)
Glucose, Bld: 134 mg/dL — ABNORMAL HIGH (ref 70–99)
Potassium: 3.5 mEq/L (ref 3.5–5.1)
Sodium: 137 mEq/L (ref 135–145)
Total Bilirubin: 0.5 mg/dL (ref 0.2–1.2)
Total Protein: 8.1 g/dL (ref 6.0–8.3)

## 2019-04-22 LAB — LIPID PANEL
Cholesterol: 134 mg/dL (ref 0–200)
HDL: 50.6 mg/dL (ref >39.00)
LDL Cholesterol: 64 mg/dL (ref 0–99)
NonHDL: 82.92
Total CHOL/HDL Ratio: 3
Triglycerides: 93 mg/dL (ref 0.0–149.0)
VLDL: 18.6 mg/dL (ref 0.0–40.0)

## 2019-04-22 LAB — T4, FREE: Free T4: 1.22 ng/dL (ref 0.60–1.60)

## 2019-04-22 LAB — HEMOGLOBIN A1C: Hgb A1c MFr Bld: 7.7 % — ABNORMAL HIGH (ref 4.6–6.5)

## 2019-04-22 LAB — TSH: TSH: 1.34 u[IU]/mL (ref 0.35–4.50)

## 2019-04-29 ENCOUNTER — Other Ambulatory Visit: Payer: Self-pay

## 2019-04-30 ENCOUNTER — Ambulatory Visit (INDEPENDENT_AMBULATORY_CARE_PROVIDER_SITE_OTHER): Payer: Medicare Other | Admitting: Endocrinology

## 2019-04-30 ENCOUNTER — Encounter: Payer: Self-pay | Admitting: Endocrinology

## 2019-04-30 VITALS — BP 130/82 | HR 81 | Ht 64.0 in | Wt 174.4 lb

## 2019-04-30 DIAGNOSIS — I10 Essential (primary) hypertension: Secondary | ICD-10-CM

## 2019-04-30 DIAGNOSIS — I259 Chronic ischemic heart disease, unspecified: Secondary | ICD-10-CM

## 2019-04-30 DIAGNOSIS — E063 Autoimmune thyroiditis: Secondary | ICD-10-CM

## 2019-04-30 DIAGNOSIS — Z794 Long term (current) use of insulin: Secondary | ICD-10-CM

## 2019-04-30 DIAGNOSIS — E1165 Type 2 diabetes mellitus with hyperglycemia: Secondary | ICD-10-CM

## 2019-04-30 NOTE — Progress Notes (Signed)
[name] 85 y.o.            Reason for Appointment: Endocrinology follow-up   History of Present Illness   Diagnosis: Type 2 DIABETES MELITUS, date of diagnosis:   2000   She has been on insulin since 2004 with difficulty controlling adequately because of compliance with diet and variability in blood sugars. Her previous records are not available at present, however she has been on basal insulin with mealtime coverage since at least 2007 She has been tried on Byetta previously but did not tolerate this well She tried Victoza but didn't not benefit from Victoza 0.6 dosage; had nausea from 1.2 mg; did not continue this because of this side effect She also was given a trial of the V-go pump but she subjectively did not like this  RECENT history:    INSULIN: Toujeo 22 units in morning, HUMALOG 6 units Acb, 6/8 lunch and 4 at supper    Oral hypoglycemic drugs: Metformin ER 750 mg in pm and Invokamet 50/1000, half tablet pm   A1c is usually over 7, now 7.7  She has the following blood sugar patterns and problems identified  She has checked her blood sugars somewhat irregularly  As before majority of her blood sugars in the late afternoons are over 200  This is likely to be from her continuing to be not taking her Humalog at lunchtime and she does not explain why; also did not appear to be checking her sugars before lunch  However on the day she had her lab work she came after lunch and because she had minimal carbohydrate her blood sugar was only 134.  Yesterday she had spaghetti for her afternoon meal and did not take any insulin causing her blood sugars to be rising up to 366  Again not been taking her Humalog before eating lunch despite consistently asking her to do this  She will mostly take her Humalog when her blood sugar goes up in the evening  She is her Invokamet at variable times in the afternoon or evening and only half a tablet  She does not do any regular  walking as she is afraid to go out  Her FASTING readings are somewhat higher recently although in the previous week she had some good readings also  She does not forget her Toujeo  Hypoglycemia occurred only once late at night likely to be from taking excess correction doses in the evening  She is concerned about the cost of Humalog on her next years insurance and does not know what is covered      .         Monitors blood glucose:  about 2-3 times daily     Glucometer: One Touch Verio.          Blood Glucose readings from download:    PRE-MEAL Fasting  1-2 PM Dinner Bedtime Overall  Glucose range:  115-155  184  116-341  60   Mean/median:  138   250  201   POST-MEAL PC Breakfast PC Lunch PC Dinner  Glucose range:    109-366  Mean/median:    199   PREVIOUS readings:  PRE-MEAL Fasting Lunch Dinner Bedtime Overall  Glucose range:  87-184   152-361    Mean/median:  124   227  155   POST-MEAL PC Breakfast PC Lunch PC Dinner  Glucose range:    39-490  Mean/median:          Meals: 3 meals  per day:  breakfast is At 7 am;  lunch 12-2 pm, dinner 5 pm Intake is quite variable at different meals, usually lighter supper    Wt Readings from Last 3 Encounters:  04/30/19 174 lb 6.4 oz (79.1 kg)  02/28/19 176 lb (79.8 kg)  02/24/19 176 lb 6.4 oz (80 kg)    Lab Results  Component Value Date   HGBA1C 7.7 (H) 04/22/2019   HGBA1C 7.3 (A) 02/28/2019   HGBA1C 7.9 (H) 02/25/2019   Lab Results  Component Value Date   MICROALBUR 28.7 (H) 02/25/2019   LDLCALC 64 04/22/2019   CREATININE 1.06 04/22/2019    No visits with results within 1 Week(s) from this visit.  Latest known visit with results is:  Lab on 04/22/2019  Component Date Value Ref Range Status  . Free T4 04/22/2019 1.22  0.60 - 1.60 ng/dL Final   Comment: Specimens from patients who are undergoing biotin therapy and /or ingesting biotin supplements may contain high levels of biotin.  The higher biotin concentration in  these specimens interferes with this Free T4 assay.  Specimens that contain high levels  of biotin may cause false high results for this Free T4 assay.  Please interpret results in light of the total clinical presentation of the patient.    Marland Kitchen TSH 04/22/2019 1.34  0.35 - 4.50 uIU/mL Final  . Cholesterol 04/22/2019 134  0 - 200 mg/dL Final   ATP III Classification       Desirable:  < 200 mg/dL               Borderline High:  200 - 239 mg/dL          High:  > = 240 mg/dL  . Triglycerides 04/22/2019 93.0  0.0 - 149.0 mg/dL Final   Normal:  <150 mg/dLBorderline High:  150 - 199 mg/dL  . HDL 04/22/2019 50.60  >39.00 mg/dL Final  . VLDL 04/22/2019 18.6  0.0 - 40.0 mg/dL Final  . LDL Cholesterol 04/22/2019 64  0 - 99 mg/dL Final  . Total CHOL/HDL Ratio 04/22/2019 3   Final                  Men          Women1/2 Average Risk     3.4          3.3Average Risk          5.0          4.42X Average Risk          9.6          7.13X Average Risk          15.0          11.0                      . NonHDL 04/22/2019 82.92   Final   NOTE:  Non-HDL goal should be 30 mg/dL higher than patient's LDL goal (i.e. LDL goal of < 70 mg/dL, would have non-HDL goal of < 100 mg/dL)  . Sodium 04/22/2019 137  135 - 145 mEq/L Final  . Potassium 04/22/2019 3.5  3.5 - 5.1 mEq/L Final  . Chloride 04/22/2019 98  96 - 112 mEq/L Final  . CO2 04/22/2019 29  19 - 32 mEq/L Final  . Glucose, Bld 04/22/2019 134* 70 - 99 mg/dL Final  . BUN 04/22/2019 17  6 - 23 mg/dL Final  . Creatinine, Ser 04/22/2019  1.06  0.40 - 1.20 mg/dL Final  . Total Bilirubin 04/22/2019 0.5  0.2 - 1.2 mg/dL Final  . Alkaline Phosphatase 04/22/2019 43  39 - 117 U/L Final  . AST 04/22/2019 22  0 - 37 U/L Final  . ALT 04/22/2019 12  0 - 35 U/L Final  . Total Protein 04/22/2019 8.1  6.0 - 8.3 g/dL Final  . Albumin 04/22/2019 4.4  3.5 - 5.2 g/dL Final  . GFR 04/22/2019 59.75* >60.00 mL/min Final  . Calcium 04/22/2019 9.9  8.4 - 10.5 mg/dL Final  . Hgb A1c MFr Bld  04/22/2019 7.7* 4.6 - 6.5 % Final   Glycemic Control Guidelines for People with Diabetes:Non Diabetic:  <6%Goal of Therapy: <7%Additional Action Suggested:  >8%      Allergies as of 04/30/2019   No Known Allergies     Medication List       Accurate as of April 30, 2019 10:21 AM. If you have any questions, ask your nurse or doctor.        Accu-Chek FastClix Lancets Misc Use Accu Chek Fastclix to check blood sugar 4 times daily.   Accu-Chek Guide Me w/Device Kit 1 each by Does not apply route 4 (four) times daily. Use accu chek guide me device to check blood sugar 4 times daily.   aspirin EC 81 MG tablet Take 1 tablet (81 mg total) by mouth daily.   benazepril 20 MG tablet Commonly known as: LOTENSIN Take 1 tablet (20 mg total) by mouth 2 (two) times daily.   carvedilol 12.5 MG tablet Commonly known as: COREG TAKE 1 TABLET BY MOUTH TWICE A DAY   cholecalciferol 1000 units tablet Commonly known as: VITAMIN D Take 1,000 Units by mouth daily.   Co Q-10 100 MG Caps Take 1 capsule by mouth 2 (two) times daily.   escitalopram 10 MG tablet Commonly known as: Lexapro Take 1 tablet (10 mg total) by mouth daily.   furosemide 20 MG tablet Commonly known as: LASIX Take 1 tablet (20 mg total) by mouth daily as needed for fluid or edema (or weight gain).   glucose blood test strip Use Accu Chek Guide test strips as instructed to check blood sugar 4 times daily.   HumaLOG KwikPen 100 UNIT/ML KwikPen Generic drug: insulin lispro INJECT 6 TO 8 UNITS INTO THE SKIN TWICE DAILY   levothyroxine 75 MCG tablet Commonly known as: SYNTHROID TAKE 1 TABLET BY MOUTH EVERY DAY   memantine 5 MG tablet Commonly known as: NAMENDA Take 5 mg by mouth at bedtime.   metFORMIN 750 MG 24 hr tablet Commonly known as: GLUCOPHAGE-XR TAKE 1 TABLET BY MOUTH EVERY DAY   multivitamins ther. w/minerals Tabs tablet Take 1 tablet by mouth daily.   rosuvastatin 20 MG tablet Commonly known as:  CRESTOR TAKE 1 TABLET BY MOUTH EVERY DAY   Toujeo SoloStar 300 UNIT/ML Sopn Generic drug: Insulin Glargine (1 Unit Dial) Inject 22 Units into the skin daily.   Tums 500 MG chewable tablet Generic drug: calcium carbonate Chew 1 tablet by mouth daily as needed for heartburn (indigestion).   vitamin B-12 100 MCG tablet Commonly known as: CYANOCOBALAMIN Take 100 mcg by mouth daily.       Allergies: No Known Allergies  Past Medical History:  Diagnosis Date  . Aortic valve disorders    S/P AVR 06/2012  . Arthritis   . Asthma    AS CHILD   . Depression   . Diabetes mellitus   . Heart  murmur   . Hyperlipidemia   . Hypertension   . Hypothyroidism   . Ischemic heart disease    with prior CABG x 2 in 2014 along with AVR  . Lumbago   . Shortness of breath   . Thyroid disease     Past Surgical History:  Procedure Laterality Date  . AORTIC VALVE REPLACEMENT N/A 06/25/2012   Procedure: AORTIC VALVE REPLACEMENT (AVR);  Surgeon: Grace Isaac, MD;  Location: [facility];  Service: Open Heart Surgery;  Laterality: N/A;  . BREAST BIOPSY Right 08/2016   benign  . BREAST SURGERY     LT BREAST BX BENIGN  . CORONARY ARTERY BYPASS GRAFT N/A 06/25/2012   Procedure: Coronary artery bypass graft  times two using left internal mammary artery and right leg greater saphenous vein;  Surgeon: Grace Isaac, MD;  Location: [facility];  Service: Open Heart Surgery;  Laterality: N/A;  . INTRAOPERATIVE TRANSESOPHAGEAL ECHOCARDIOGRAM N/A 06/25/2012   Procedure: INTRAOPERATIVE TRANSESOPHAGEAL ECHOCARDIOGRAM;  Surgeon: Grace Isaac, MD;  Location: [facility];  Service: Open Heart Surgery;  Laterality: N/A;  . TUBAL LIGATION      Family History  Problem Relation Age of Onset  . Heart disease Father     Social History:  reports that she has never smoked. She has never used smokeless tobacco. She reports that she does not drink alcohol or use drugs.  Review of Systems:   HYPERTENSION: Blood pressure  is followed by PCP and cardiologist Treatment regimen includes benazepril and carvedilol She periodically will check blood pressure at home  BP Readings from Last 3 Encounters:  04/30/19 130/82  02/28/19 (!) 156/82  02/24/19 140/80     HYPOTHYROIDISM: She has had long-standing hypothyroidism  She is regular with her levothyroxine 75 g dose No unusual fatigue recently  TSH has been recently in the normal range   Lab Results  Component Value Date   TSH 1.34 04/22/2019   TSH 1.38 02/25/2019   TSH 0.96 08/19/2018   FREET4 1.22 04/22/2019   FREET4 1.03 02/25/2019   FREET4 1.02 08/19/2018    HYPERLIPIDEMIA: The lipid abnormality consists of elevated LDL; has been  controlled with Crestor 20 mg LDL is below 70  Lab Results  Component Value Date   CHOL 134 04/22/2019   HDL 50.60 04/22/2019   LDLCALC 64 04/22/2019   LDLDIRECT 102.8 11/28/2013   TRIG 93.0 04/22/2019   CHOLHDL 3 04/22/2019  '    She does see a podiatrist regularly for onychomycosis  Last foot exam was in 07/2018 with podiatrist: Diabetic foot exam shows normal monofilament sensation in the toes and plantar surfaces, no skin lesions or ulcers on the feet and normal pedal pulses  DEPRESSION:  given Lexapro from her PCP     Examination:   BP 130/82 (BP Location: Left Arm, Patient Position: Sitting, Cuff Size: Large)   Pulse 81   Ht _0  (1.626 m)   Wt 174 lb 6.4 oz (79.1 kg)   SpO2 96%   BMI 29.94 kg/m   Body mass index is 29.94 kg/m.    ASSESSMENT/ PLAN:   Diabetes type 2, uncontrolled   See history of present illness for a discussion of the blood sugar patterns, recent management and problems identified  A1c 7.7, previously 7.3  As before she has more motivation to follow instructions for her mealtime insulin and Invokamet Also not doing any exercise Despite repeated instructions she does not take any coverage for her lunchtime and likely  is concerned about the cost of her Humalog and will  not take it regularly Appears to be taking it mostly when the blood sugar goes up Even when she has significant carbohydrate intake she does not cover her meals consistently  Recommendations:  She will need to take her Invokamet in the morning and take the whole tablet for best benefits  Although she may be a candidate for a 70/30 insulin in the morning for covering her afternoon readings she had a blood sugar 138 only in the lab in the early afternoon without any midday Humalog  Showed her her blood sugar patterns and need for mealtime insulin coverage to prevent blood sugar spikes  Discussed A1c target of 7  She again refuses the V-go pump which would be otherwise useful  She will check on her insurance coverage for alternatives to Humalog next year  Discussed timing of her Humalog insulin as always before eating regardless of Premeal blood sugar  Avoid taking more than 3 to 4 units for high readings late in the evening which will prevent hypoglycemia  HYPERTENSION: Variably controlled, followed by PCP and cardiologist  Renal function normal but microalbumin slightly high Blood pressure medications currently managed by other physicians   Hypothyroidism: Treated with 75 mcg and TSH is normal  Lipids: Well-controlled  Counseling time on subjects discussed in assessment and plan sections is over 50% of today's 25 minute visit    There are no Patient Instructions on file for this visit.       Elayne Snare 04/30/2019, 10:21 AM   Note: This office note was prepared with Dragon voice recognition system technology. Any transcriptional errors that result from this process are unintentional.

## 2019-04-30 NOTE — Patient Instructions (Addendum)
Check blood sugars on waking up 2-3 days a week  Also check blood sugars about 2 hours after meals and do this after different meals by rotation  Recommended blood sugar levels on waking up are 90-130 and about 2 hours after meal is 130-180  Please bring your blood sugar monitor to each visit, thank you  MUST TAKE HUMALOG AT LUNCH  TAKE 1 FULL INVOKAMET IN AM DAILY  WALK DAILY  Check cost of Novolog

## 2019-05-12 ENCOUNTER — Telehealth: Payer: Self-pay

## 2019-05-12 ENCOUNTER — Other Ambulatory Visit: Payer: Self-pay

## 2019-05-12 MED ORDER — SYNJARDY XR 10-1000 MG PO TB24: 1.0000 | ORAL_TABLET | Freq: Every day | ORAL | 2 refills | Status: DC

## 2019-05-12 NOTE — Telephone Encounter (Signed)
PA is needed for Invokamet. Insurance will not approve until pt has tried and failed or has contraindications to one of the following: Celso Amy XR, or any separate combination of either [place] and Metformin or Jardiance and Metformin.

## 2019-05-12 NOTE — Telephone Encounter (Signed)
She will switch to Synjardy XR 01/999 daily

## 2019-05-12 NOTE — Telephone Encounter (Signed)
Rx sent and pt called and notified of medication change. She verbalized understanding.

## 2019-05-13 ENCOUNTER — Telehealth: Payer: Self-pay

## 2019-05-13 NOTE — Telephone Encounter (Signed)
Pt called and stated that she was having issues getting her OneTouch verio test strips. She stated that the pharmacy stated there was an issue with the insurance covering it. Pt stated that she had to pay $56 yesterday for 50 strips. I called pharmacy to get more information and they stated that on 03/17/2019, pt picked up 100 strips for $3.36. Pharmacy stated that they mistaken listed this as a 90 day supply, but pt checks blood sugar QID. Pharmacy tech stated that she would reach out to the [facility] third party people that deal with medicare insurance and have this changed from 90 day to 30 day, so when pt tries to fill, it will not show up as to early to fill. -Called pt and notified her of this. She verbalized understanding. Pt was advised to give the pharmacy a day or two to get this straightened out and then give them a call and see if they have it corrected.

## 2019-05-13 NOTE — Telephone Encounter (Signed)
Made in error

## 2019-05-15 ENCOUNTER — Other Ambulatory Visit: Payer: Self-pay | Admitting: Endocrinology

## 2019-05-15 ENCOUNTER — Other Ambulatory Visit: Payer: Self-pay | Admitting: Nurse Practitioner

## 2019-05-16 ENCOUNTER — Telehealth: Payer: Self-pay

## 2019-05-16 ENCOUNTER — Other Ambulatory Visit: Payer: Self-pay

## 2019-05-16 MED ORDER — PEN NEEDLES 30G X 5 MM MISC: 1.0000 | Freq: Three times a day (TID) | 2 refills | Status: DC

## 2019-05-16 NOTE — Progress Notes (Signed)
CARDIOLOGY OFFICE NOTE  Date:  05/20/2019    [name] Date of Birth: 02-12-1933 Medical Record #[id number]  PCP:  Rogers Blocker, MD  Cardiologist:  Servando Snare Nahser  Chief Complaint  Patient presents with  . Follow-up    History of Present Illness: [name] is a 86 y.o. female who presents today for a 3 month check.  Seen for Dr. Acie Fredrickson. She primarily follows with me.   She has a history of DM, prior AVR and CABG x 2 per [facility] back in 2014 - this was with a LIMA to the LAD and SVG to the LCX,with a pericardial Edwards tissue valve #21 mm. Did have post op atrial fib - was on amiodaronefor a short time. Other issues include HLD, HTN, DM, depression and hypothyroidism. Myoview ok from 06/2013. Last echo 12/2014.   I have followed her over the years. Now on some diuretic for her breathing. She tends to get depressed. Struggles with weight loss and has chronic fatigue. Worse with the pandemic.   Seen in September and was noted to be on 2 beta blockers - I stopped the Bystolic - she was improved with more energy and less depression on follow up in October - but had also had a family member pay for a roof that had brought her some financial relief. Cardiac status otherwise, ok.   The patient does not have symptoms concerning for COVID-19 infection (fever, chills, cough, or new shortness of breath).   Comes in today. Here alone. Her sister is downstairs. She is doing ok. No chest pain. Breathing is ok. She remains depressed - not taking the Lexapro but willing to now. She is worried about her memory - misplaces objects. Still watching TV most of the day. BP ok at Dr. Ronnie Derby office. Has lost 2 pounds. She has not been able to get thru to schedule a COVID shot.   Past Medical History:  Diagnosis Date  . Aortic valve disorders    S/P AVR 06/2012  . Arthritis   . Asthma    AS CHILD   . Depression   . Diabetes mellitus   . Heart murmur   . Hyperlipidemia   .  Hypertension   . Hypothyroidism   . Ischemic heart disease    with prior CABG x 2 in 2014 along with AVR  . Lumbago   . Shortness of breath   . Thyroid disease     Past Surgical History:  Procedure Laterality Date  . AORTIC VALVE REPLACEMENT N/A 06/25/2012   Procedure: AORTIC VALVE REPLACEMENT (AVR);  Surgeon: Grace Isaac, MD;  Location: [place];  Service: Open Heart Surgery;  Laterality: N/A;  . BREAST BIOPSY Right 08/2016   benign  . BREAST SURGERY     LT BREAST BX BENIGN  . CORONARY ARTERY BYPASS GRAFT N/A 06/25/2012   Procedure: Coronary artery bypass graft  times two using left internal mammary artery and right leg greater saphenous vein;  Surgeon: Grace Isaac, MD;  Location: [place];  Service: Open Heart Surgery;  Laterality: N/A;  . INTRAOPERATIVE TRANSESOPHAGEAL ECHOCARDIOGRAM N/A 06/25/2012   Procedure: INTRAOPERATIVE TRANSESOPHAGEAL ECHOCARDIOGRAM;  Surgeon: Grace Isaac, MD;  Location: [place];  Service: Open Heart Surgery;  Laterality: N/A;  . TUBAL LIGATION       Medications: Current Meds  Medication Sig  . Accu-Chek FastClix Lancets MISC Use Accu Chek Fastclix to check blood sugar 4 times daily.  Marland Kitchen aspirin EC  81 MG tablet Take 1 tablet (81 mg total) by mouth daily.  . benazepril (LOTENSIN) 20 MG tablet Take 1 tablet (20 mg total) by mouth 2 (two) times daily.  . Blood Glucose Monitoring Suppl (ACCU-CHEK GUIDE ME) w/Device KIT 1 each by Does not apply route 4 (four) times daily. Use accu chek guide me device to check blood sugar 4 times daily.  . calcium carbonate (TUMS) 500 MG chewable tablet Chew 1 tablet by mouth daily as needed for heartburn (indigestion).  . carvedilol (COREG) 12.5 MG tablet TAKE 1 TABLET BY MOUTH TWICE A DAY  . cholecalciferol (VITAMIN D) 1000 UNITS tablet Take 1,000 Units by mouth daily.  . Coenzyme Q10 (CO Q-10) 100 MG CAPS Take 1 capsule by mouth 2 (two) times daily.  Marland Kitchen escitalopram (LEXAPRO) 10 MG tablet Take 1 tablet (10 mg total)  by mouth daily.  . furosemide (LASIX) 20 MG tablet Take 1 tablet (20 mg total) by mouth daily as needed for fluid or edema (or weight gain).  Marland Kitchen glucose blood test strip Use Accu Chek Guide test strips as instructed to check blood sugar 4 times daily.  Marland Kitchen HUMALOG KWIKPEN 100 UNIT/ML KwikPen INJECT 6 TO 8 UNITS INTO THE SKIN TWICE DAILY  . Insulin Glargine, 1 Unit Dial, (TOUJEO SOLOSTAR) 300 UNIT/ML SOPN Inject 22 Units into the skin daily.  . Insulin Pen Needle (PEN NEEDLES) 30G X 5 MM MISC 1 each by Does not apply route 3 (three) times daily. Use one pen needle to inject medication three times daily.  Marland Kitchen levothyroxine (SYNTHROID) 75 MCG tablet TAKE 1 TABLET BY MOUTH EVERY DAY  . memantine (NAMENDA) 5 MG tablet Take 5 mg by mouth at bedtime.  . metFORMIN (GLUCOPHAGE-XR) 750 MG 24 hr tablet TAKE 1 TABLET BY MOUTH EVERY DAY  . Multiple Vitamins-Minerals (MULTIVITAMINS THER. W/MINERALS) TABS Take 1 tablet by mouth daily.  . rosuvastatin (CRESTOR) 20 MG tablet TAKE 1 TABLET BY MOUTH EVERY DAY  . vitamin B-12 (CYANOCOBALAMIN) 100 MCG tablet Take 100 mcg by mouth daily.  . [DISCONTINUED] escitalopram (LEXAPRO) 10 MG tablet Take 1 tablet (10 mg total) by mouth daily.  . [DISCONTINUED] furosemide (LASIX) 20 MG tablet Take 1 tablet (20 mg total) by mouth daily as needed for fluid or edema (or weight gain).     Allergies: No Known Allergies  Social History: The patient  reports that she has never smoked. She has never used smokeless tobacco. She reports that she does not drink alcohol or use drugs.   Family History: The patient's family history includes Heart disease in her father.   Review of Systems: Please see the history of present illness.   All other systems are reviewed and negative.   Physical Exam: VS:  BP (!) 160/80   Pulse 80   Ht 5' 4"  (1.626 m)   Wt 174 lb (78.9 kg)   SpO2 97%   BMI 29.87 kg/m  .  BMI Body mass index is 29.87 kg/m.  Wt Readings from Last 3 Encounters:    05/20/19 174 lb (78.9 kg)  04/30/19 174 lb 6.4 oz (79.1 kg)  02/28/19 176 lb (79.8 kg)    General: Pleasant. Alert and in no acute distress.  Weight is down 2 pounds.  HEENT: Normal.  Neck: Supple, no JVD, carotid bruits, or masses noted.  Cardiac: Regular rate and rhythm. No murmurs, rubs, or gallops. No edema.  Respiratory:  Lungs are clear to auscultation bilaterally with normal work of breathing.  GI:  Soft and nontender.  MS: No deformity or atrophy. Gait and ROM intact.  Skin: Warm and dry. Color is normal.  Neuro:  Strength and sensation are intact and no gross focal deficits noted.  Psych: Alert, appropriate and with normal affect.   LABORATORY DATA:  EKG:  EKG is not ordered today.  Lab Results  Component Value Date   WBC 5.4 01/29/2019   HGB 10.8 (L) 01/29/2019   HCT 32.7 (L) 01/29/2019   PLT 194 01/29/2019   GLUCOSE 134 (H) 04/22/2019   CHOL 134 04/22/2019   TRIG 93.0 04/22/2019   HDL 50.60 04/22/2019   LDLDIRECT 102.8 11/28/2013   LDLCALC 64 04/22/2019   ALT 12 04/22/2019   AST 22 04/22/2019   NA 137 04/22/2019   K 3.5 04/22/2019   CL 98 04/22/2019   CREATININE 1.06 04/22/2019   BUN 17 04/22/2019   CO2 29 04/22/2019   TSH 1.34 04/22/2019   INR 1.57 (H) 06/25/2012   HGBA1C 7.7 (H) 04/22/2019   MICROALBUR 28.7 (H) 02/25/2019     BNP (last 3 results) No results for input(s): BNP in the last 8760 hours.  ProBNP (last 3 results) No results for input(s): PROBNP in the last 8760 hours.   Other Studies Reviewed Today:  EchoStudy Conclusions8/2018  - Left ventricle: The cavity size was normal. Systolic function was normal. The estimated ejection fraction was in the range of 55% to 60%. Wall motion was normal; there were no regional wall motion abnormalities. There was an increased relative contribution of atrial contraction to ventricular filling. Doppler parameters are consistent with abnormal left ventricular relaxation (grade 1  diastolic dysfunction). - Aortic valve: A pericardial bioprosthesis was present and functioning normally. Mean gradient (S): 9 mm Hg. Peak gradient (S): 17 mm Hg. - Mitral valve: Calcified annulus. Mildly thickened leaflets . There was mild to moderate regurgitation. - Left atrium: The atrium was mildly dilated. - Atrial septum: There was increased thickness of the septum, consistent with lipomatous hypertrophy. - Pulmonary arteries: PA peak pressure: 42 mm Hg (S).  Impressions:  - The right ventricular systolic pressure was increased consistent with moderate pulmonary hypertension.   Myoview Impression from 06/2013 Exercise Capacity: Lexiscan with no exercise. BP Response: Normal blood pressure response. Clinical Symptoms: No significant symptoms noted. ECG Impression: There are scattered PVCs. Comparison with Prior Nuclear Study: No previous nuclear study performed  Overall Impression: Low risk stress nuclear study small fixed distal anterior artifact which could be breast attenuation or other artifact.  LV Ejection Fraction: 66%. LV Wall Motion: Normal Wall Motion  [name], MD, [facility]   ASSESSMENT & PLAN:   1. CAD - remote CABG/AVR from 2014 - Myoview from 2015 was stable. She is doing ok. Continues to struggle with CV risk factor modification.   2. Prior AVR - last echo from August of 2018 was stable - we may consider updating later this year. She has no cardinal symptoms.   3. Depression - remains crux of her issues - not really getting any activity due to the pandemic - she is going to get back on Lexapro - reminded that this needs to be taken daily.   4. Memory issues - she is doing to speak to Dr. Marlou Sa.   5. HTN - BP little high here today - better at her last visit with Dr. Dwyane Dee - will follow for now - she is really not wanting anymore medicine.   6. HLD - on statin   7. Obesity -  always a struggle but she is down 2 pounds.  Encouragement given.   8. Chronic anemia  9. DM - per Dr. Dwyane Dee  10.  COVID-19 Education: The signs and symptoms of COVID-19 were discussed with the patient and how to seek care for testing (follow up with PCP or arrange E-visit).  The importance of social distancing, staying at home, hand hygiene and wearing a mask when out in public were discussed today. Information on how to obtain vaccine given today.   Current medicines are reviewed with the patient today.  The patient does not have concerns regarding medicines other than what has been noted above.  The following changes have been made:  See above.  Labs/ tests ordered today include:   No orders of the defined types were placed in this encounter.    Disposition:   FU with me in 4 months.   Patient is agreeable to this plan and will call if any problems develop in the interim.   SignedTruitt Merle, NP  05/20/2019 9:22 AM  [facility] [address] [place], [place]  [address] Phone: [phone] Fax: [phone]

## 2019-05-16 NOTE — Telephone Encounter (Signed)
Rx sent 

## 2019-05-16 NOTE — Telephone Encounter (Signed)
MEDICATION: PEN NEEDLES    PHARMACY:  [id number] - [place], [place] - [address] AT [place] OF [address]  IS THIS A 90 DAY SUPPLY :   IS PATIENT OUT OF MEDICATION:   IF NOT; HOW MUCH IS LEFT:   LAST APPOINTMENT DATE: @1 /14/2021  NEXT APPOINTMENT DATE:@3 /30/2021  DO WE HAVE YOUR PERMISSION TO LEAVE A DETAILED MESSAGE:  OTHER COMMENTS:    **Let patient know to contact pharmacy at the end of the day to make sure medication is ready. **  ** Please notify patient to allow 48-72 hours to process**  **Encourage patient to contact the pharmacy for refills or they can request refills through [facility]**

## 2019-05-20 ENCOUNTER — Other Ambulatory Visit: Payer: Self-pay

## 2019-05-20 ENCOUNTER — Ambulatory Visit (INDEPENDENT_AMBULATORY_CARE_PROVIDER_SITE_OTHER): Payer: Medicare Other | Admitting: Nurse Practitioner

## 2019-05-20 ENCOUNTER — Encounter: Payer: Self-pay | Admitting: Nurse Practitioner

## 2019-05-20 VITALS — BP 160/80 | HR 80 | Ht 64.0 in | Wt 174.0 lb

## 2019-05-20 DIAGNOSIS — Z951 Presence of aortocoronary bypass graft: Secondary | ICD-10-CM | POA: Diagnosis not present

## 2019-05-20 DIAGNOSIS — I259 Chronic ischemic heart disease, unspecified: Secondary | ICD-10-CM

## 2019-05-20 DIAGNOSIS — Z7189 Other specified counseling: Secondary | ICD-10-CM

## 2019-05-20 DIAGNOSIS — E7849 Other hyperlipidemia: Secondary | ICD-10-CM

## 2019-05-20 DIAGNOSIS — Z952 Presence of prosthetic heart valve: Secondary | ICD-10-CM | POA: Diagnosis not present

## 2019-05-20 DIAGNOSIS — F329 Major depressive disorder, single episode, unspecified: Secondary | ICD-10-CM

## 2019-05-20 DIAGNOSIS — F32A Depression, unspecified: Secondary | ICD-10-CM

## 2019-05-20 DIAGNOSIS — I10 Essential (primary) hypertension: Secondary | ICD-10-CM | POA: Diagnosis not present

## 2019-05-20 MED ORDER — FUROSEMIDE 20 MG PO TABS: 20.0000 mg | ORAL_TABLET | Freq: Every day | ORAL | 3 refills | Status: AC | PRN

## 2019-05-20 MED ORDER — ESCITALOPRAM OXALATE 10 MG PO TABS: 10.0000 mg | ORAL_TABLET | Freq: Every day | ORAL | 6 refills | Status: DC

## 2019-05-20 NOTE — Patient Instructions (Addendum)
After Visit Summary:  We will be checking the following labs today - NONE   Medication Instructions:    Continue with your current medicines.   I have refilled your Lasix and Lexapro today   If you need a refill on your cardiac medications before your next appointment, please call your pharmacy.     Testing/Procedures To Be Arranged:  N/A  Follow-Up:   See me in 4 months    At [facility], you and your health needs are our priority.  As part of our continuing mission to provide you with exceptional heart care, we have created designated Provider Care Teams.  These Care Teams include your primary Cardiologist (physician) and Advanced Practice Providers (APPs -  Physician Assistants and Nurse Practitioners) who all work together to provide you with the care you need, when you need it.  Special Instructions:  . Stay safe, stay home, wash your hands for at least 20 seconds and wear a mask when out in public.  . It was good to talk with you today.  . Talk with Dr. Marlou Sa about your memory  We are recommending the COVID 19 vaccine to ALL of our patients. Cardiac medications (including blood thinners) should not deter anyone from being vaccinated. There is no need to hold any of these medicines prior to vaccine administration.   If you are interested in the COVID 19 vaccine - call the [facility] Department at [phone] and select Option #2. Appointments can be arranged at various locations but eventually all will be moved to the [facility]. No walk-ins will be accepted.   Visit [email or web address] and clinic on the "COVID 19 Vaccine Info" for more information.   COVID-19 Vaccine Information can also be found at: [email or web address].co.uk   For questions related to vaccine distribution or appointments, please email [email or web address] or call [phone].   A waitlist has been offered for those  seeking to make an appointment - please go to [email or web address]    Call the [place] office at [phone] if you have any questions, problems or concerns.

## 2019-05-30 ENCOUNTER — Ambulatory Visit: Payer: Medicare Other | Admitting: Podiatry

## 2019-06-02 ENCOUNTER — Telehealth: Payer: Self-pay | Admitting: *Deleted

## 2019-06-02 NOTE — Telephone Encounter (Signed)
Assisted pt with covid vaccine wait list placement.

## 2019-06-09 ENCOUNTER — Other Ambulatory Visit: Payer: Medicare Other

## 2019-06-16 ENCOUNTER — Encounter: Payer: Self-pay | Admitting: Podiatry

## 2019-06-16 ENCOUNTER — Ambulatory Visit (INDEPENDENT_AMBULATORY_CARE_PROVIDER_SITE_OTHER): Payer: Medicare Other | Admitting: Podiatry

## 2019-06-16 ENCOUNTER — Other Ambulatory Visit: Payer: Self-pay

## 2019-06-16 DIAGNOSIS — M79676 Pain in unspecified toe(s): Secondary | ICD-10-CM

## 2019-06-16 DIAGNOSIS — L84 Corns and callosities: Secondary | ICD-10-CM

## 2019-06-16 DIAGNOSIS — M2041 Other hammer toe(s) (acquired), right foot: Secondary | ICD-10-CM

## 2019-06-16 DIAGNOSIS — B351 Tinea unguium: Secondary | ICD-10-CM

## 2019-06-16 DIAGNOSIS — E1151 Type 2 diabetes mellitus with diabetic peripheral angiopathy without gangrene: Secondary | ICD-10-CM | POA: Diagnosis not present

## 2019-06-16 NOTE — Progress Notes (Signed)
Complaint:  Visit Type: Patient returns to my office for continued preventative foot care services. Complaint: Patient states" my nails have grown long and thick and become painful to walk and wear shoes" Patient has been diagnosed with DM with no foot complications. The patient presents for preventative foot care services. No changes to ROS.  Patient has painful hammer toe second right foot.  Podiatric Exam: Vascular: dorsalis pedis are palpable bilateral.  Posterior tibial pulses are not palpable.Capillary return is immediate. Temperature gradient is WNL. Skin turgor WNL  Sensorium: Diminished  Semmes Weinstein monofilament test. Normal tactile sensation bilaterally. Nail Exam: Pt has thick disfigured discolored nails with subungual debris noted bilateral entire nail hallux through fifth toenails Ulcer Exam: There is no evidence of ulcer or pre-ulcerative changes or infection. Orthopedic Exam: Muscle tone and strength are WNL. No limitations in general ROM. No crepitus or effusions noted. Foot type and digits show no abnormalities. HAV  B/L.  Hammer toe second right. Skin: No Porokeratosis asymptomatic.Marland Kitchen No infection or ulcers.  HD second toe right foot.  Diagnosis:  Onychomycosis, , Pain in right toe, pain in left toes  Treatment & Plan Procedures and Treatment: Consent by patient was obtained for treatment procedures.   Debridement of mycotic and hypertrophic toenails, 1 through 5 bilateral and clearing of subungual debris. No ulceration, no infection noted. Debride heloma durum second toe right foot.   Return Visit-Office Procedure: Patient instructed to return to the office for a follow up visit 3 months for continued evaluation and treatment.    Gardiner Barefoot DPM

## 2019-06-27 ENCOUNTER — Other Ambulatory Visit: Payer: Self-pay | Admitting: Endocrinology

## 2019-07-01 ENCOUNTER — Telehealth: Payer: Self-pay | Admitting: Endocrinology

## 2019-07-01 ENCOUNTER — Other Ambulatory Visit: Payer: Self-pay

## 2019-07-01 MED ORDER — GLUCOSE BLOOD VI STRP: ORAL_STRIP | 3 refills | Status: DC

## 2019-07-01 NOTE — Telephone Encounter (Signed)
Patient called stating she "cannot get test strips from her pharmacy and she's not asking for a refill" and she requests [name] to give her a call. Ph# [phone].

## 2019-07-01 NOTE — Telephone Encounter (Signed)
Rx sent 

## 2019-07-03 ENCOUNTER — Telehealth: Payer: Self-pay

## 2019-07-03 NOTE — Telephone Encounter (Signed)
Received fax from [facility] stating that the patient's Accu Chek test strips require a PA. Called pt's insurance and inquired about what glucometer is covered. Medicare Rep stated that they do not have a glucometer brand that is preferred as long as patient meets the definition of medical necessity. Rep stated that the patient needs a CMN form done by this office and this should be it in order to get her test strips.  Rep placed me on hold and connected with pt's pharmacy to reopen the claim for test strips and have CMN sent to this office for completion so patient can get test strips.

## 2019-07-03 NOTE — Telephone Encounter (Signed)
FAXED [place]: [facility]   Document: CMN for test strips Other records requested: none at this time.  All above requested information has been faxed successfully to [employer] listed above. Documents and fax confirmation have been placed in the faxed file for future reference.

## 2019-07-21 ENCOUNTER — Telehealth: Payer: Self-pay

## 2019-07-21 NOTE — Telephone Encounter (Signed)
Called and left a VM requesting patient call back to reschedule her 08/01/19 appointment Dr. Dwyane Dee will be out of the office

## 2019-07-29 ENCOUNTER — Other Ambulatory Visit: Payer: Medicare Other

## 2019-07-31 ENCOUNTER — Other Ambulatory Visit: Payer: Self-pay

## 2019-07-31 MED ORDER — GLUCOSE BLOOD VI STRP: ORAL_STRIP | 2 refills | Status: DC

## 2019-08-01 ENCOUNTER — Ambulatory Visit: Payer: Medicare Other | Admitting: Endocrinology

## 2019-08-06 ENCOUNTER — Other Ambulatory Visit: Payer: Medicare Other

## 2019-08-06 ENCOUNTER — Other Ambulatory Visit: Payer: Self-pay

## 2019-08-06 ENCOUNTER — Other Ambulatory Visit: Payer: Self-pay | Admitting: Nurse Practitioner

## 2019-08-06 ENCOUNTER — Other Ambulatory Visit (INDEPENDENT_AMBULATORY_CARE_PROVIDER_SITE_OTHER): Payer: Medicare Other

## 2019-08-06 DIAGNOSIS — Z794 Long term (current) use of insulin: Secondary | ICD-10-CM | POA: Diagnosis not present

## 2019-08-06 DIAGNOSIS — E1165 Type 2 diabetes mellitus with hyperglycemia: Secondary | ICD-10-CM

## 2019-08-06 LAB — URINALYSIS, ROUTINE W REFLEX MICROSCOPIC
Bilirubin Urine: NEGATIVE
Nitrite: NEGATIVE
Specific Gravity, Urine: >=1.03 — AB (ref 1.000–1.030)
Total Protein, Urine: 100 — AB
Urine Glucose: 500 — AB
Urobilinogen, UA: 1 (ref 0.0–1.0)
pH: 6 (ref 5.0–8.0)

## 2019-08-06 LAB — BASIC METABOLIC PANEL
BUN: 21 mg/dL (ref 6–23)
CO2: 30 mEq/L (ref 19–32)
Calcium: 9.6 mg/dL (ref 8.4–10.5)
Chloride: 102 mEq/L (ref 96–112)
Creatinine, Ser: 1.11 mg/dL (ref 0.40–1.20)
GFR: 56.61 mL/min — ABNORMAL LOW (ref >60.00)
Glucose, Bld: 186 mg/dL — ABNORMAL HIGH (ref 70–99)
Potassium: 3.7 mEq/L (ref 3.5–5.1)
Sodium: 138 mEq/L (ref 135–145)

## 2019-08-06 LAB — MICROALBUMIN / CREATININE URINE RATIO
Creatinine,U: 141.7 mg/dL
Microalb Creat Ratio: 54.8 mg/g — ABNORMAL HIGH (ref 0.0–30.0)
Microalb, Ur: 77.7 mg/dL — ABNORMAL HIGH (ref 0.0–1.9)

## 2019-08-06 LAB — HEMOGLOBIN A1C: Hgb A1c MFr Bld: 8 % — ABNORMAL HIGH (ref 4.6–6.5)

## 2019-08-11 ENCOUNTER — Ambulatory Visit (INDEPENDENT_AMBULATORY_CARE_PROVIDER_SITE_OTHER): Payer: Medicare Other | Admitting: Endocrinology

## 2019-08-11 ENCOUNTER — Encounter: Payer: Self-pay | Admitting: Endocrinology

## 2019-08-11 ENCOUNTER — Other Ambulatory Visit: Payer: Self-pay

## 2019-08-11 VITALS — BP 140/76 | HR 74 | Ht 64.0 in | Wt 173.6 lb

## 2019-08-11 DIAGNOSIS — Z794 Long term (current) use of insulin: Secondary | ICD-10-CM

## 2019-08-11 DIAGNOSIS — E1165 Type 2 diabetes mellitus with hyperglycemia: Secondary | ICD-10-CM | POA: Diagnosis not present

## 2019-08-11 DIAGNOSIS — I259 Chronic ischemic heart disease, unspecified: Secondary | ICD-10-CM

## 2019-08-11 DIAGNOSIS — E7800 Pure hypercholesterolemia, unspecified: Secondary | ICD-10-CM | POA: Diagnosis not present

## 2019-08-11 DIAGNOSIS — I10 Essential (primary) hypertension: Secondary | ICD-10-CM

## 2019-08-11 DIAGNOSIS — E063 Autoimmune thyroiditis: Secondary | ICD-10-CM | POA: Diagnosis not present

## 2019-08-11 NOTE — Progress Notes (Signed)
[name] 86 y.o.            Reason for Appointment: Endocrinology follow-up   History of Present Illness   Diagnosis: Type 2 DIABETES MELITUS, date of diagnosis:   2000   She has been on insulin since 2004 with difficulty controlling adequately because of compliance with diet and variability in blood sugars. Her previous records are not available at present, however she has been on basal insulin with mealtime coverage since at least 2007 She has been tried on Byetta previously but did not tolerate this well She tried Victoza but didn't not benefit from Victoza 0.6 dosage; had nausea from 1.2 mg; did not continue this because of this side effect She also was given a trial of the V-go pump but she subjectively did not like this  RECENT history:    INSULIN: Toujeo 22 units in morning, HUMALOG 6 units Acb, 6 lunch and 4 at supper    Oral hypoglycemic drugs: Metformin ER 750 mg in pm and Synjardy XR 01/999, half tablet in afternoon   A1c is usually over 7, now 8.0  She has the following blood sugar patterns and problems identified  She has significantly high readings midday and afternoon as before  She appears to be having high readings before lunch although not consistently  As before she is complaining about the cost of Humalog; may possibly not take her doses regularly especially at lunchtime  As before majority of her blood sugars in the late afternoons are over 200 and is still averaging about the same as last time  She has been told to take her SGLT2 drugs in the morning but she continues to take these in the afternoons  Now taking Synjardy since apparently Invokana was not covered  Although FASTING blood sugars are mostly fairly good she occasionally has high readings also  No recent hypoglycemia  Usually not eating out at restaurant  Her weight is about the same  Not doing much walking for exercise  Blood sugars around 9-10 PM are usually excellent       .         Monitors blood glucose:  about 2-3 times daily     Glucometer: One Touch Verio.          Blood Glucose readings from download:    PRE-MEAL Fasting Lunch Dinner Bedtime Overall  Glucose range:  94-203      Mean/median:  154 213    161   POST-MEAL PC Breakfast PC Lunch PC Dinner  Glucose range:   193-376  93-246  Mean/median:   249  117   Previous readings:  PRE-MEAL Fasting  1-2 PM Dinner Bedtime Overall  Glucose range:  115-155  184  116-341  60   Mean/median:  138   250  201   POST-MEAL PC Breakfast PC Lunch PC Dinner  Glucose range:    109-366  Mean/median:    199       Meals: 3 meals per day:  breakfast is At 7 am;  lunch 12-2 pm, dinner 5 pm Intake is quite variable at different meals, usually lighter supper    Wt Readings from Last 3 Encounters:  08/11/19 173 lb 9.6 oz (78.7 kg)  05/20/19 174 lb (78.9 kg)  04/30/19 174 lb 6.4 oz (79.1 kg)    Lab Results  Component Value Date   HGBA1C 8.0 (H) 08/06/2019   HGBA1C 7.7 (H) 04/22/2019   HGBA1C 7.3 (A) 02/28/2019   Lab  Results  Component Value Date   MICROALBUR 77.7 (H) 08/06/2019   LDLCALC 64 04/22/2019   CREATININE 1.11 08/06/2019    Lab on 08/06/2019  Component Date Value Ref Range Status  . Sodium 08/06/2019 138  135 - 145 mEq/L Final  . Potassium 08/06/2019 3.7  3.5 - 5.1 mEq/L Final  . Chloride 08/06/2019 102  96 - 112 mEq/L Final  . CO2 08/06/2019 30  19 - 32 mEq/L Final  . Glucose, Bld 08/06/2019 186* 70 - 99 mg/dL Final  . BUN 08/06/2019 21  6 - 23 mg/dL Final  . Creatinine, Ser 08/06/2019 1.11  0.40 - 1.20 mg/dL Final  . GFR 08/06/2019 56.61* >60.00 mL/min Final  . Calcium 08/06/2019 9.6  8.4 - 10.5 mg/dL Final  . Hgb A1c MFr Bld 08/06/2019 8.0* 4.6 - 6.5 % Final   Glycemic Control Guidelines for People with Diabetes:Non Diabetic:  <6%Goal of Therapy: <7%Additional Action Suggested:  >8%   . Color, Urine 08/06/2019 YELLOW  Yellow;Lt. Yellow;Straw;Dark Yellow;Amber;Green;Red;Brown Final    . APPearance 08/06/2019 Cloudy* Clear;Turbid;Slightly Cloudy;Cloudy Final  . Specific Gravity, Urine 08/06/2019 >=1.030* 1.000 - 1.030 Final  . pH 08/06/2019 6.0  5.0 - 8.0 Final  . Total Protein, Urine 08/06/2019 100* Negative Final  . Urine Glucose 08/06/2019 500* Negative Final  . Ketones, ur 08/06/2019 TRACE* Negative Final  . Bilirubin Urine 08/06/2019 NEGATIVE  Negative Final  . Hgb urine dipstick 08/06/2019 TRACE-INTACT* Negative Final  . Urobilinogen, UA 08/06/2019 1.0  0.0 - 1.0 Final  . Leukocytes,Ua 08/06/2019 TRACE* Negative Final  . Nitrite 08/06/2019 NEGATIVE  Negative Final  . WBC, UA 08/06/2019 21-50/hpf* 0-2/hpf Final  . RBC / HPF 08/06/2019 0-2/hpf  0-2/hpf Final  . Mucus, UA 08/06/2019 Presence of* None Final  . Squamous Epithelial / LPF 08/06/2019 Rare(0-4/hpf)  Rare(0-4/hpf) Final  . Bacteria, UA 08/06/2019 Many(>50/hpf)* None Final  . Amorphous 08/06/2019 Present* None;Present Final  . Microalb, Ur 08/06/2019 77.7* 0.0 - 1.9 mg/dL Final  . Creatinine,U 08/06/2019 141.7  mg/dL Final  . Microalb Creat Ratio 08/06/2019 54.8* 0.0 - 30.0 mg/g Final     Allergies as of 08/11/2019   No Known Allergies     Medication List       Accurate as of August 11, 2019 12:55 PM. If you have any questions, ask your nurse or doctor.        Accu-Chek FastClix Lancets Misc Use Accu Chek Fastclix to check blood sugar 4 times daily.   Accu-Chek Guide Me w/Device Kit 1 each by Does not apply route 4 (four) times daily. Use accu chek guide me device to check blood sugar 4 times daily.   aspirin EC 81 MG tablet Take 1 tablet (81 mg total) by mouth daily.   benazepril 20 MG tablet Commonly known as: LOTENSIN Take 1 tablet (20 mg total) by mouth 2 (two) times daily.   carvedilol 12.5 MG tablet Commonly known as: COREG TAKE 1 TABLET BY MOUTH TWICE A DAY   cholecalciferol 1000 units tablet Commonly known as: VITAMIN D Take 1,000 Units by mouth daily.   Co Q-10 100 MG  Caps Take 1 capsule by mouth 2 (two) times daily.   escitalopram 10 MG tablet Commonly known as: Lexapro Take 1 tablet (10 mg total) by mouth daily.   furosemide 20 MG tablet Commonly known as: LASIX Take 1 tablet (20 mg total) by mouth daily as needed for fluid or edema (or weight gain).   glucose blood test strip Use Accu  Chek Guide test strips as instructed to check blood sugar 4 times daily.   glucose blood test strip Use Onetouch verio test strips as instructed to check blood sugar 4 times daily. DX:E11.65   HumaLOG KwikPen 100 UNIT/ML KwikPen Generic drug: insulin lispro INJECT 6 TO 8 UNITS INTO THE SKIN TWICE DAILY What changed: See the new instructions.   levothyroxine 75 MCG tablet Commonly known as: SYNTHROID TAKE 1 TABLET BY MOUTH EVERY DAY   memantine 5 MG tablet Commonly known as: NAMENDA Take 5 mg by mouth at bedtime.   metFORMIN 750 MG 24 hr tablet Commonly known as: GLUCOPHAGE-XR TAKE 1 TABLET BY MOUTH EVERY DAY   multivitamins ther. w/minerals Tabs tablet Take 1 tablet by mouth daily.   Pen Needles 30G X 5 MM Misc 1 each by Does not apply route 3 (three) times daily. Use one pen needle to inject medication three times daily.   rosuvastatin 20 MG tablet Commonly known as: CRESTOR TAKE 1 TABLET BY MOUTH EVERY DAY   Synjardy XR 01-999 MG Tb24 Generic drug: Empagliflozin-metFORMIN HCl ER Take 1 tablet by mouth daily.   Toujeo SoloStar 300 UNIT/ML Solostar Pen Generic drug: insulin glargine (1 Unit Dial) Inject 22 Units into the skin daily.   Tums 500 MG chewable tablet Generic drug: calcium carbonate Chew 1 tablet by mouth daily as needed for heartburn (indigestion).   vitamin B-12 100 MCG tablet Commonly known as: CYANOCOBALAMIN Take 100 mcg by mouth daily.       Allergies: No Known Allergies  Past Medical History:  Diagnosis Date  . Aortic valve disorders    S/P AVR 06/2012  . Arthritis   . Asthma    AS CHILD   . Depression   .  Diabetes mellitus   . Heart murmur   . Hyperlipidemia   . Hypertension   . Hypothyroidism   . Ischemic heart disease    with prior CABG x 2 in 2014 along with AVR  . Lumbago   . Shortness of breath   . Thyroid disease     Past Surgical History:  Procedure Laterality Date  . AORTIC VALVE REPLACEMENT N/A 06/25/2012   Procedure: AORTIC VALVE REPLACEMENT (AVR);  Surgeon: Grace Isaac, MD;  Location: [facility];  Service: Open Heart Surgery;  Laterality: N/A;  . BREAST BIOPSY Right 08/2016   benign  . BREAST SURGERY     LT BREAST BX BENIGN  . CORONARY ARTERY BYPASS GRAFT N/A 06/25/2012   Procedure: Coronary artery bypass graft  times two using left internal mammary artery and right leg greater saphenous vein;  Surgeon: Grace Isaac, MD;  Location: [facility];  Service: Open Heart Surgery;  Laterality: N/A;  . INTRAOPERATIVE TRANSESOPHAGEAL ECHOCARDIOGRAM N/A 06/25/2012   Procedure: INTRAOPERATIVE TRANSESOPHAGEAL ECHOCARDIOGRAM;  Surgeon: Grace Isaac, MD;  Location: [facility];  Service: Open Heart Surgery;  Laterality: N/A;  . TUBAL LIGATION      Family History  Problem Relation Age of Onset  . Heart disease Father     Social History:  reports that she has never smoked. She has never used smokeless tobacco. She reports that she does not drink alcohol or use drugs.  Review of Systems:   HYPERTENSION: Blood pressure is followed by PCP and cardiologist Treatment regimen includes benazepril and carvedilol She will check blood pressure at home  BP Readings from Last 3 Encounters:  08/11/19 140/76  05/20/19 (!) 160/80  04/30/19 130/82     HYPOTHYROIDISM: She has had  long-standing hypothyroidism  She is regular with her levothyroxine 75 g dose She feels fairly good  TSH has been consistently in the normal range   Lab Results  Component Value Date   TSH 1.34 04/22/2019   TSH 1.38 02/25/2019   TSH 0.96 08/19/2018   FREET4 1.22 04/22/2019   FREET4 1.03 02/25/2019   FREET4  1.02 08/19/2018    HYPERLIPIDEMIA: The lipid abnormality consists of elevated LDL; has been  controlled with Crestor 20 mg LDL is below 70  Lab Results  Component Value Date   CHOL 134 04/22/2019   HDL 50.60 04/22/2019   LDLCALC 64 04/22/2019   LDLDIRECT 102.8 11/28/2013   TRIG 93.0 04/22/2019   CHOLHDL 3 04/22/2019  '    She does see a podiatrist regularly for onychomycosis  Last foot exam was in 07/2018 with podiatrist: Diabetic foot exam shows normal monofilament sensation in the toes and plantar surfaces, no skin lesions or ulcers on the feet and normal pedal pulses  DEPRESSION:  given Lexapro from her PCP     Examination:   BP 140/76 (BP Location: Left Arm, Patient Position: Sitting, Cuff Size: Large)   Pulse 74   Ht 5' 4"  (1.626 m)   Wt 173 lb 9.6 oz (78.7 kg)   SpO2 97%   BMI 29.80 kg/m   Body mass index is 29.8 kg/m.    ASSESSMENT/ PLAN:   Diabetes type 2, uncontrolled   See history of present illness for a discussion of the blood sugar patterns, recent management and problems identified  A1c is going up and now 8  She has inadequate control of postprandial readings during the day especially in the afternoon as before She is not taking enough insulin to cover her breakfast or lunch at least from the available blood sugars Most likely needs at least 2 to 4 units more to cover her lunch and also tendency to high readings during the middle of the day and afternoons  She thinks she is taking her insulin more regularly at lunchtime but this seems inconsistent Also for higher carbohydrate meals in the morning she leads at least 2 units more Currently taking Synjardy in the afternoon and does not understand the need to take it in the morning despite repeated instructions Fasting readings are high but not consistent  Recommendations:  She will need to take her Synjardy in the morning  Patient assistance program for Humalog  She will take extra 2 units for  higher carbohydrate meals in the morning and discussed when to increase the dose  To take at least 2 to 4 units more at lunchtime and may take up to 10 units for higher carbohydrate meals  No change in [place] at this time  Encouraged her to start walking regularly  We will need to see if her pharmacy has proper instructions on when and how much to give her for test trips that she is complaining about gaining adequate prescription  Discussed blood sugar targets after meals  HYPERTENSION: Today better controlled, followed by PCP and cardiologist  Renal function normal consistently Microalbumin has been slightly high    Hypothyroidism: Treated with 75 mcg and TSH has been normal  Lipids: Well-controlled     Patient Instructions  HUMALOG 8 AT LUNCH, may need 10 units if  Afternoon sugars over 200  HUMALOG 8 IN AM IF EATING Cheerios or pancackes  Synjardy in am only  Check blood sugars on waking up 4 days a week  Also check  blood sugars about 2 hours after meals and do this after different meals by rotation  Recommended blood sugar levels on waking up are 90-130 and about 2 hours after meal is 130-180  Please bring your blood sugar monitor to each visit, thank you          Elayne Snare 08/11/2019, 12:55 PM   Note: This office note was prepared with Dragon voice recognition system technology. Any transcriptional errors that result from this process are unintentional.

## 2019-08-11 NOTE — Patient Instructions (Addendum)
HUMALOG 8 AT LUNCH, may need 10 units if  Afternoon sugars over 200  HUMALOG 8 IN AM IF EATING Cheerios or pancackes  Synjardy in am only  Check blood sugars on waking up 4 days a week  Also check blood sugars about 2 hours after meals and do this after different meals by rotation  Recommended blood sugar levels on waking up are 90-130 and about 2 hours after meal is 130-180  Please bring your blood sugar monitor to each visit, thank you

## 2019-08-12 ENCOUNTER — Telehealth: Payer: Self-pay

## 2019-08-12 NOTE — Telephone Encounter (Signed)
At pt's appt yesterday, pt voiced concerns that she has been having issues getting her OneTouch verio test strips. Called pharmacy and tried to get more information. After several minutes, pharmacist was able to get the Rx to go through for a 25 day supply. Called pt and informed her of this. Pt verbalized understanding.

## 2019-08-25 LAB — HM DIABETES EYE EXAM

## 2019-08-27 ENCOUNTER — Telehealth: Payer: Self-pay

## 2019-08-27 NOTE — Telephone Encounter (Signed)
Per instructions of Dr. Dwyane Dee, a patient assitance application was given to the pt at time of last visit on 08/11/2019 for [employer] to get assistance with her medications.  Explained to the patient all necesarry paperwork and that she would need to bring this back to the office once completed, and then we would complete the MD portion.  Patient returned the application on [id number] along with proof of income as required per application instructions. Application was filled out and faxed to [facility] on 08/18/19. Received notification on 08/21/19 that [employer] did not receive the copy of pt's income. This was then faxed again on 08/22/19.  Received fax on 08/27/19 stating that the patient meets eligibility guidelines and has been enrolled in the program until the end of the calendar year.  Medications will soon be delivered to either the patient's home of providers office.

## 2019-09-02 ENCOUNTER — Other Ambulatory Visit: Payer: Self-pay | Admitting: Endocrinology

## 2019-09-10 NOTE — Progress Notes (Deleted)
CARDIOLOGY OFFICE NOTE  Date:  09/10/2019    [name] Date of Birth: Jul 21, 1932 Medical Record [id number]  PCP:  Rogers Blocker, MD  Cardiologist:  Servando Snare & ***    No chief complaint on file.   History of Present Illness: [name] is a 86 y.o. female who presents today for a *** Seen for Dr. Acie Fredrickson. She primarily follows with me.   She has a history of DM, prior AVR and CABG x 2 per EBG back in 2014 - this was with a LIMA to the LAD and SVG to the LCX,with a pericardial Edwards tissue valve #21 mm. Did have post op atrial fib - was on amiodaronefor a short time. Other issues include HLD, HTN, DM, depression and hypothyroidism. Myoview ok from 06/2013. Last echo 12/2014.   I have followed her over the years. Now on some diuretic for her breathing. She tends to get depressed. Struggles with weight loss and has chronic fatigue. Worse with the pandemic.   Seen in September and was noted to be on 2 beta blockers - I stopped the Bystolic - she was improved with more energy and less depression on follow up in October - but had also had a family member pay for a roof that had brought her some financial relief. Cardiac status otherwise, ok.   The patient does not have symptoms concerning for COVID-19 infection (fever, chills, cough, or new shortness of breath).   Comes in today. Here alone. Her sister is downstairs. She is doing ok. No chest pain. Breathing is ok. She remains depressed - not taking the Lexapro but willing to now. She is worried about her memory - misplaces objects. Still watching TV most of the day. BP ok at Dr. Ronnie Derby office. Has lost 2 pounds. She has not been able to get thru to schedule a COVID shot.  The patient {does/does not:200015} have symptoms concerning for COVID-19 infection (fever, chills, cough, or new shortness of breath).   Comes in today. Here with   Past Medical History:  Diagnosis Date  . Aortic valve disorders    S/P AVR  06/2012  . Arthritis   . Asthma    AS CHILD   . Depression   . Diabetes mellitus   . Heart murmur   . Hyperlipidemia   . Hypertension   . Hypothyroidism   . Ischemic heart disease    with prior CABG x 2 in 2014 along with AVR  . Lumbago   . Shortness of breath   . Thyroid disease     Past Surgical History:  Procedure Laterality Date  . AORTIC VALVE REPLACEMENT N/A 06/25/2012   Procedure: AORTIC VALVE REPLACEMENT (AVR);  Surgeon: Grace Isaac, MD;  Location: [facility];  Service: Open Heart Surgery;  Laterality: N/A;  . BREAST BIOPSY Right 08/2016   benign  . BREAST SURGERY     LT BREAST BX BENIGN  . CORONARY ARTERY BYPASS GRAFT N/A 06/25/2012   Procedure: Coronary artery bypass graft  times two using left internal mammary artery and right leg greater saphenous vein;  Surgeon: Grace Isaac, MD;  Location: [facility];  Service: Open Heart Surgery;  Laterality: N/A;  . INTRAOPERATIVE TRANSESOPHAGEAL ECHOCARDIOGRAM N/A 06/25/2012   Procedure: INTRAOPERATIVE TRANSESOPHAGEAL ECHOCARDIOGRAM;  Surgeon: Grace Isaac, MD;  Location: [facility];  Service: Open Heart Surgery;  Laterality: N/A;  . TUBAL LIGATION       Medications: No outpatient medications have been marked  as taking for the 09/16/19 encounter (Appointment) with Burtis Junes, NP.     Allergies: No Known Allergies  Social History: The patient  reports that she has never smoked. She has never used smokeless tobacco. She reports that she does not drink alcohol or use drugs.   Family History: The patient's ***family history includes Heart disease in her father.   Review of Systems: Please see the history of present illness.   All other systems are reviewed and negative.   Physical Exam: VS:  There were no vitals taken for this visit. Marland Kitchen  BMI There is no height or weight on file to calculate BMI.  Wt Readings from Last 3 Encounters:  08/11/19 173 lb 9.6 oz (78.7 kg)  05/20/19 174 lb (78.9 kg)  04/30/19 174 lb 6.4  oz (79.1 kg)    General: Pleasant. Well developed, well nourished and in no acute distress.   HEENT: Normal.  Neck: Supple, no JVD, carotid bruits, or masses noted.  Cardiac: ***Regular rate and rhythm. No murmurs, rubs, or gallops. No edema.  Respiratory:  Lungs are clear to auscultation bilaterally with normal work of breathing.  GI: Soft and nontender.  MS: No deformity or atrophy. Gait and ROM intact.  Skin: Warm and dry. Color is normal.  Neuro:  Strength and sensation are intact and no gross focal deficits noted.  Psych: Alert, appropriate and with normal affect.   LABORATORY DATA:  EKG:  EKG {ACTION; IS/IS [id number] ordered today.  Personally reviewed by me. This demonstrates ***.  Lab Results  Component Value Date   WBC 5.4 01/29/2019   HGB 10.8 (L) 01/29/2019   HCT 32.7 (L) 01/29/2019   PLT 194 01/29/2019   GLUCOSE 186 (H) 08/06/2019   CHOL 134 04/22/2019   TRIG 93.0 04/22/2019   HDL 50.60 04/22/2019   LDLDIRECT 102.8 11/28/2013   LDLCALC 64 04/22/2019   ALT 12 04/22/2019   AST 22 04/22/2019   NA 138 08/06/2019   K 3.7 08/06/2019   CL 102 08/06/2019   CREATININE 1.11 08/06/2019   BUN 21 08/06/2019   CO2 30 08/06/2019   TSH 1.34 04/22/2019   INR 1.57 (H) 06/25/2012   HGBA1C 8.0 (H) 08/06/2019   MICROALBUR 77.7 (H) 08/06/2019     BNP (last 3 results) No results for input(s): BNP in the last 8760 hours.  ProBNP (last 3 results) No results for input(s): PROBNP in the last 8760 hours.   Other Studies Reviewed Today:   Assessment/Plan: EchoStudy Conclusions8/2018  - Left ventricle: The cavity size was normal. Systolic function was normal. The estimated ejection fraction was in the range of 55% to 60%. Wall motion was normal; there were no regional wall motion abnormalities. There was an increased relative contribution of atrial contraction to ventricular filling. Doppler parameters are consistent with abnormal left  ventricular relaxation (grade 1 diastolic dysfunction). - Aortic valve: A pericardial bioprosthesis was present and functioning normally. Mean gradient (S): 9 mm Hg. Peak gradient (S): 17 mm Hg. - Mitral valve: Calcified annulus. Mildly thickened leaflets . There was mild to moderate regurgitation. - Left atrium: The atrium was mildly dilated. - Atrial septum: There was increased thickness of the septum, consistent with lipomatous hypertrophy. - Pulmonary arteries: PA peak pressure: 42 mm Hg (S).  Impressions:  - The right ventricular systolic pressure was increased consistent with moderate pulmonary hypertension.   Myoview Impression from 06/2013 Exercise Capacity: Lexiscan with no exercise. BP Response: Normal blood pressure response. Clinical Symptoms: No significant  symptoms noted. ECG Impression: There are scattered PVCs. Comparison with Prior Nuclear Study: No previous nuclear study performed  Overall Impression: Low risk stress nuclear study small fixed distal anterior artifact which could be breast attenuation or other artifact.  LV Ejection Fraction: 66%. LV Wall Motion: Normal Wall Motion  [name], MD, [facility]   ASSESSMENT & PLAN:   1. CAD - remote CABG/AVR from 2014 - Myoview from 2015 was stable. She is doing ok. Continues to struggle with CV risk factor modification.   2. Prior AVR - last echo from August of 2018 was stable - we may consider updating later this year. She has no cardinal symptoms.   3. Depression - remains crux of her issues - not really getting any activity due to the pandemic - she is going to get back on Lexapro - reminded that this needs to be taken daily.   4. Memory issues - she is doing to speak to Dr. Marlou Sa.   5. HTN - BP little high here today - better at her last visit with Dr. Dwyane Dee - will follow for now - she is really not wanting anymore medicine.   6. HLD - on statin   7. Obesity - always  a struggle but she is down 2 pounds. Encouragement given.   8. Chronic anemia  9. DM - per Dr. Dwyane Dee  . COVID-19 Education: The signs and symptoms of COVID-19 were discussed with the patient and how to seek care for testing (follow up with PCP or arrange E-visit).  The importance of social distancing, staying at home, hand hygiene and wearing a mask when out in public were discussed today.  Current medicines are reviewed with the patient today.  The patient does not have concerns regarding medicines other than what has been noted above.  The following changes have been made:  See above.  Labs/ tests ordered today include:   No orders of the defined types were placed in this encounter.    Disposition:   FU with *** in {gen number [id number] {Days to years:10300}.   Patient is agreeable to this plan and will call if any problems develop in the interim.   SignedTruitt Merle, NP  09/10/2019 7:58 AM  [place] [address] [place], [place]  [address] Phone: [phone] Fax: [phone]

## 2019-09-16 ENCOUNTER — Ambulatory Visit: Payer: Medicare Other | Admitting: Nurse Practitioner

## 2019-09-17 ENCOUNTER — Ambulatory Visit (INDEPENDENT_AMBULATORY_CARE_PROVIDER_SITE_OTHER): Payer: Medicare Other | Admitting: Podiatry

## 2019-09-17 ENCOUNTER — Encounter: Payer: Self-pay | Admitting: Podiatry

## 2019-09-17 ENCOUNTER — Other Ambulatory Visit: Payer: Self-pay

## 2019-09-17 VITALS — Temp 97.3°F

## 2019-09-17 DIAGNOSIS — B351 Tinea unguium: Secondary | ICD-10-CM | POA: Diagnosis not present

## 2019-09-17 DIAGNOSIS — L84 Corns and callosities: Secondary | ICD-10-CM | POA: Diagnosis not present

## 2019-09-17 DIAGNOSIS — E1151 Type 2 diabetes mellitus with diabetic peripheral angiopathy without gangrene: Secondary | ICD-10-CM | POA: Diagnosis not present

## 2019-09-17 DIAGNOSIS — M79676 Pain in unspecified toe(s): Secondary | ICD-10-CM | POA: Diagnosis not present

## 2019-09-17 LAB — HM DIABETES FOOT EXAM: HM Diabetic Foot Exam: NORMAL

## 2019-09-17 NOTE — Progress Notes (Signed)
This patient returns to my office for at risk foot care.  This patient requires this care by a professional since this patient will be at risk due to having  Type 2 diabetes.    This patient is unable to cut nails herself since the patient cannot reach her nails.These nails are painful walking and wearing shoes.  This patient presents for at risk foot care today.  General Appearance  Alert, conversant and in no acute stress.  Vascular  Dorsalis pedis  pulses are palpable  bilaterally. Posterior tibial pulses are absent  B/L.  Capillary return is within normal limits  bilaterally. Temperature is within normal limits  bilaterally.  Neurologic  Senn-Weinstein monofilament wire test within normal limits  bilaterally. Muscle power within normal limits bilaterally.  Nails Thick disfigured discolored nails with subungual debris  from hallux to fifth toes bilaterally. No evidence of bacterial infection or drainage bilaterally.  Orthopedic  No limitations of motion  feet .  No crepitus or effusions noted.  HAV  B/L.  Hammer toe second right.  Skin  normotropic skin with no porokeratosis noted bilaterally.  No signs of infections or ulcers noted.   HD second right  Onychomycosis  Pain in right toes  Pain in left toes  HD 2nd toe right  Consent was obtained for treatment procedures.   Mechanical debridement of nails 1-5  bilaterally performed with a nail nipper.  Filed with dremel without incident.  Debridement of corn with # 15 blade.   Return office visit  12 weeks                   Told patient to return for periodic foot care and evaluation due to potential at risk complications.   Gardiner Barefoot DPM

## 2019-09-24 NOTE — Progress Notes (Signed)
CARDIOLOGY OFFICE NOTE  Date:  10/01/2019    [name] Date of Birth: 09/08/1932 Medical Record #[id number]  PCP:  Rogers Blocker, MD  Cardiologist:  Servando Snare Nahser    Chief Complaint  Patient presents with  . Follow-up    History of Present Illness: [name] is a 86 y.o. female who presents today for a follow up visit.  Seen for Dr. Acie Fredrickson. She primarily follows with me.   She has a history of DM, prior AVR and CABG x 2 per EBG back in 2014 - this was with a LIMA to the LAD and SVG to the LCX,with a pericardial Edwards tissue valve #21 mm. Did have post op atrial fib - was on amiodaronefor a short time. Other issues include HLD, HTN, DM, depression and hypothyroidism. Myoview ok from 06/2013. Last echo 12/2014.   I have followed her over the years. Now on some diuretic for her breathing. She tends to get depressed. Struggles with weight loss and has chronic fatigue. Worse with the pandemic. In September of 2020 she was noted to be on 2 beta blockers - I stopped the Bystolic - she improved with more energy and less depression on follow up in October - but had also had a family member pay for a roof that had brought her some financial relief. Cardiac status otherwise, ok. Last seen this past January - she was doing ok. Still depressed - she agreed to go back on low dose Lexapro - tends to start and stop. Worried about her memory. Watching TV most of the day. Trying to get vaccinated.   The patient does not have symptoms concerning for COVID-19 infection (fever, chills, cough, or new shortness of breath).   Comes in today. Here alone. She has been vaccinated. Feeling ok. Starting to get out a little more. No chest pain. Saw Dr. Dwyane Dee - he helped her get some assistance with her insulin - sounds like she was not able to afford - therefore, not taking, A1C went up - she feels like this will be better on recheck. Back off her Lexapro - she was not sure what this was  for. Admits some issues with her memory at times. Breathing is ok. Not dizzy. No falls.   Past Medical History:  Diagnosis Date  . Aortic valve disorders    S/P AVR 06/2012  . Arthritis   . Asthma    AS CHILD   . Depression   . Diabetes mellitus   . Heart murmur   . Hyperlipidemia   . Hypertension   . Hypothyroidism   . Ischemic heart disease    with prior CABG x 2 in 2014 along with AVR  . Lumbago   . Shortness of breath   . Thyroid disease     Past Surgical History:  Procedure Laterality Date  . AORTIC VALVE REPLACEMENT N/A 06/25/2012   Procedure: AORTIC VALVE REPLACEMENT (AVR);  Surgeon: Grace Isaac, MD;  Location: [facility];  Service: Open Heart Surgery;  Laterality: N/A;  . BREAST BIOPSY Right 08/2016   benign  . BREAST SURGERY     LT BREAST BX BENIGN  . CORONARY ARTERY BYPASS GRAFT N/A 06/25/2012   Procedure: Coronary artery bypass graft  times two using left internal mammary artery and right leg greater saphenous vein;  Surgeon: Grace Isaac, MD;  Location: [facility];  Service: Open Heart Surgery;  Laterality: N/A;  . INTRAOPERATIVE TRANSESOPHAGEAL ECHOCARDIOGRAM N/A 06/25/2012  Procedure: INTRAOPERATIVE TRANSESOPHAGEAL ECHOCARDIOGRAM;  Surgeon: Grace Isaac, MD;  Location: [facility];  Service: Open Heart Surgery;  Laterality: N/A;  . TUBAL LIGATION       Medications: Current Meds  Medication Sig  . Accu-Chek FastClix Lancets MISC Use Accu Chek Fastclix to check blood sugar 4 times daily.  Marland Kitchen aspirin EC 81 MG tablet Take 1 tablet (81 mg total) by mouth daily.  . benazepril (LOTENSIN) 20 MG tablet Take 1 tablet (20 mg total) by mouth 2 (two) times daily.  . Blood Glucose Monitoring Suppl (ACCU-CHEK GUIDE ME) w/Device KIT 1 each by Does not apply route 4 (four) times daily. Use accu chek guide me device to check blood sugar 4 times daily.  . calcium carbonate (TUMS) 500 MG chewable tablet Chew 1 tablet by mouth daily as needed for heartburn (indigestion).  .  carvedilol (COREG) 12.5 MG tablet TAKE 1 TABLET BY MOUTH TWICE A DAY  . cholecalciferol (VITAMIN D) 1000 UNITS tablet Take 1,000 Units by mouth daily.  . Coenzyme Q10 (CO Q-10) 100 MG CAPS Take 1 capsule by mouth 2 (two) times daily.  . Empagliflozin-metFORMIN HCl ER (SYNJARDY XR) 01-999 MG TB24 Take 1 tablet by mouth daily. Per patient taking 1/2 tablet 01-999 mg  . furosemide (LASIX) 20 MG tablet Take 1 tablet (20 mg total) by mouth daily as needed for fluid or edema (or weight gain).  Marland Kitchen glucose blood test strip Use Accu Chek Guide test strips as instructed to check blood sugar 4 times daily.  Marland Kitchen glucose blood test strip Use Onetouch verio test strips as instructed to check blood sugar 4 times daily. DX:E11.65  . HUMALOG KWIKPEN 100 UNIT/ML KwikPen INJECT 6 TO 8 UNITS INTO THE SKIN TWICE DAILY  . Insulin Pen Needle (PEN NEEDLES) 30G X 5 MM MISC 1 each by Does not apply route 3 (three) times daily. Use one pen needle to inject medication three times daily.  Marland Kitchen levothyroxine (SYNTHROID) 75 MCG tablet TAKE 1 TABLET BY MOUTH EVERY DAY  . metFORMIN (GLUCOPHAGE-XR) 750 MG 24 hr tablet TAKE 1 TABLET BY MOUTH EVERY DAY  . Multiple Vitamins-Minerals (MULTIVITAMINS THER. W/MINERALS) TABS Take 1 tablet by mouth daily.  . rosuvastatin (CRESTOR) 20 MG tablet TAKE 1 TABLET BY MOUTH EVERY DAY  . TOUJEO SOLOSTAR 300 UNIT/ML Solostar Pen ADMINISTER 22 UNITS UNDER THE SKIN DAILY  . vitamin B-12 (CYANOCOBALAMIN) 100 MCG tablet Take 100 mcg by mouth daily.     Allergies: No Known Allergies  Social History: The patient  reports that she has never smoked. She has never used smokeless tobacco. She reports that she does not drink alcohol or use drugs.   Family History: The patient's family history includes Heart disease in her father.   Review of Systems: Please see the history of present illness.   All other systems are reviewed and negative.   Physical Exam: VS:  BP 128/72   Pulse 66   Ht '5\' 4"'$  (1.626  m)   Wt 172 lb 1.9 oz (78.1 kg)   SpO2 99%   BMI 29.54 kg/m  .  BMI Body mass index is 29.54 kg/m.  Wt Readings from Last 3 Encounters:  10/01/19 172 lb 1.9 oz (78.1 kg)  08/11/19 173 lb 9.6 oz (78.7 kg)  05/20/19 174 lb (78.9 kg)    General: Pleasant. Alert and in no acute distress. Weight is down 2 pounds.    Cardiac: Regular rate and rhythm. No murmurs, rubs, or gallops. No edema.  Respiratory:  Lungs are clear to auscultation bilaterally with normal work of breathing.  GI: Soft and nontender.  MS: No deformity or atrophy. Gait and ROM intact.  Skin: Warm and dry. Color is normal.  Neuro:  Strength and sensation are intact and no gross focal deficits noted.  Psych: Alert, appropriate and with normal affect.   LABORATORY DATA:  EKG:  EKG is not ordered today. .  Lab Results  Component Value Date   WBC 5.4 01/29/2019   HGB 10.8 (L) 01/29/2019   HCT 32.7 (L) 01/29/2019   PLT 194 01/29/2019   GLUCOSE 186 (H) 08/06/2019   CHOL 134 04/22/2019   TRIG 93.0 04/22/2019   HDL 50.60 04/22/2019   LDLDIRECT 102.8 11/28/2013   LDLCALC 64 04/22/2019   ALT 12 04/22/2019   AST 22 04/22/2019   NA 138 08/06/2019   K 3.7 08/06/2019   CL 102 08/06/2019   CREATININE 1.11 08/06/2019   BUN 21 08/06/2019   CO2 30 08/06/2019   TSH 1.34 04/22/2019   INR 1.57 (H) 06/25/2012   HGBA1C 8.0 (H) 08/06/2019   MICROALBUR 77.7 (H) 08/06/2019     BNP (last 3 results) No results for input(s): BNP in the last 8760 hours.  ProBNP (last 3 results) No results for input(s): PROBNP in the last 8760 hours.   Other Studies Reviewed Today:  EchoStudy Conclusions8/2018  - Left ventricle: The cavity size was normal. Systolic function was normal. The estimated ejection fraction was in the range of 55% to 60%. Wall motion was normal; there were no regional wall motion abnormalities. There was an increased relative contribution of atrial contraction to ventricular filling. Doppler  parameters are consistent with abnormal left ventricular relaxation (grade 1 diastolic dysfunction). - Aortic valve: A pericardial bioprosthesis was present and functioning normally. Mean gradient (S): 9 mm Hg. Peak gradient (S): 17 mm Hg. - Mitral valve: Calcified annulus. Mildly thickened leaflets . There was mild to moderate regurgitation. - Left atrium: The atrium was mildly dilated. - Atrial septum: There was increased thickness of the septum, consistent with lipomatous hypertrophy. - Pulmonary arteries: PA peak pressure: 42 mm Hg (S).  Impressions:  - The right ventricular systolic pressure was increased consistent with moderate pulmonary hypertension.   Myoview Impression from 06/2013 Exercise Capacity: Lexiscan with no exercise. BP Response: Normal blood pressure response. Clinical Symptoms: No significant symptoms noted. ECG Impression: There are scattered PVCs. Comparison with Prior Nuclear Study: No previous nuclear study performed  Overall Impression: Low risk stress nuclear study small fixed distal anterior artifact which could be breast attenuation or other artifact.  LV Ejection Fraction: 66%. LV Wall Motion: Normal Wall Motion  [name], MD, [facility]   ASSESSMENT & PLAN:   1. CAD - remote CABG/AVR from 2014 - stable Myoview from 2015 - doing ok with no worrisome symptoms. Continues to struggle with CV risk factor modification.   2. Prior AVR - no worrisome symptoms.   3. Depression - seems a little better today - she tends to be on and off - probably mostly off her Lexapro. Seems to be getting out more now that the pandemic is easing up.   4. Memory issues - she notes this.   5. HTN - BP is fine - no changes made today.   6. HLD - on statin - lab today.   7. Chronic anemia - lab today.   8. DM - has gotten some assistance with her insulin - hopefully this will help.  9. COVID-19 Education: The signs and symptoms of  COVID-19 were discussed with the patient and how to seek care for testing (follow up with PCP or arrange E-visit).  The importance of social distancing, staying at home, hand hygiene and wearing a mask when out in public were discussed today. She has been vaccinated.   Current medicines are reviewed with the patient today.  The patient does not have concerns regarding medicines other than what has been noted above.  The following changes have been made:  See above.  Labs/ tests ordered today include:    Orders Placed This Encounter  Procedures  . Basic metabolic panel  . CBC  . Hepatic function panel  . Lipid panel     Disposition:   FU with me in 4 months.   Patient is agreeable to this plan and will call if any problems develop in the interim.   SignedTruitt Merle, NP  10/01/2019 12:05 PM  [facility] [address] [place], [place]  [address] Phone: [phone] Fax: [phone]

## 2019-10-01 ENCOUNTER — Encounter: Payer: Self-pay | Admitting: Nurse Practitioner

## 2019-10-01 ENCOUNTER — Ambulatory Visit (INDEPENDENT_AMBULATORY_CARE_PROVIDER_SITE_OTHER): Payer: Medicare Other | Admitting: Nurse Practitioner

## 2019-10-01 ENCOUNTER — Other Ambulatory Visit: Payer: Self-pay

## 2019-10-01 VITALS — BP 128/72 | HR 66 | Ht 64.0 in | Wt 172.1 lb

## 2019-10-01 DIAGNOSIS — I10 Essential (primary) hypertension: Secondary | ICD-10-CM

## 2019-10-01 DIAGNOSIS — I259 Chronic ischemic heart disease, unspecified: Secondary | ICD-10-CM

## 2019-10-01 DIAGNOSIS — Z951 Presence of aortocoronary bypass graft: Secondary | ICD-10-CM | POA: Diagnosis not present

## 2019-10-01 DIAGNOSIS — Z952 Presence of prosthetic heart valve: Secondary | ICD-10-CM

## 2019-10-01 DIAGNOSIS — E7849 Other hyperlipidemia: Secondary | ICD-10-CM

## 2019-10-01 DIAGNOSIS — Z7189 Other specified counseling: Secondary | ICD-10-CM

## 2019-10-01 LAB — CBC
Hematocrit: 32.2 % — ABNORMAL LOW (ref 34.0–46.6)
Hemoglobin: 10.4 g/dL — ABNORMAL LOW (ref 11.1–15.9)
MCH: 30.6 pg (ref 26.6–33.0)
MCHC: 32.3 g/dL (ref 31.5–35.7)
MCV: 95 fL (ref 79–97)
Platelets: 202 10*3/uL (ref 150–450)
RBC: 3.4 x10E6/uL — ABNORMAL LOW (ref 3.77–5.28)
RDW: 13 % (ref 11.7–15.4)
WBC: 6.4 10*3/uL (ref 3.4–10.8)

## 2019-10-01 LAB — HEPATIC FUNCTION PANEL
ALT: 11 IU/L (ref 0–32)
AST: 19 IU/L (ref 0–40)
Albumin: 4.5 g/dL (ref 3.6–4.6)
Alkaline Phosphatase: 57 IU/L (ref 48–121)
Bilirubin Total: 0.3 mg/dL (ref 0.0–1.2)
Bilirubin, Direct: 0.14 mg/dL (ref 0.00–0.40)
Total Protein: 7.8 g/dL (ref 6.0–8.5)

## 2019-10-01 LAB — BASIC METABOLIC PANEL
BUN/Creatinine Ratio: 16 (ref 12–28)
BUN: 19 mg/dL (ref 8–27)
CO2: 24 mmol/L (ref 20–29)
Calcium: 10.2 mg/dL (ref 8.7–10.3)
Chloride: 100 mmol/L (ref 96–106)
Creatinine, Ser: 1.17 mg/dL — ABNORMAL HIGH (ref 0.57–1.00)
GFR calc Af Amer: 49 mL/min/{1.73_m2} — ABNORMAL LOW (ref >59)
GFR calc non Af Amer: 43 mL/min/{1.73_m2} — ABNORMAL LOW (ref >59)
Glucose: 91 mg/dL (ref 65–99)
Potassium: 4.2 mmol/L (ref 3.5–5.2)
Sodium: 139 mmol/L (ref 134–144)

## 2019-10-01 LAB — LIPID PANEL
Chol/HDL Ratio: 2.7 ratio (ref 0.0–4.4)
Cholesterol, Total: 148 mg/dL (ref 100–199)
HDL: 54 mg/dL (ref >39)
LDL Chol Calc (NIH): 79 mg/dL (ref 0–99)
Triglycerides: 79 mg/dL (ref 0–149)
VLDL Cholesterol Cal: 15 mg/dL (ref 5–40)

## 2019-10-01 NOTE — Patient Instructions (Addendum)
After Visit Summary:  We will be checking the following labs today - BMET, CBC, HPF and Lipids   Medication Instructions:    Continue with your current medicines.    If you need a refill on your cardiac medications before your next appointment, please call your pharmacy.     Testing/Procedures To Be Arranged:  N/A  Follow-Up:   See me in about 4 months    At [facility], you and your health needs are our priority.  As part of our continuing mission to provide you with exceptional heart care, we have created designated Provider Care Teams.  These Care Teams include your primary Cardiologist (physician) and Advanced Practice Providers (APPs -  Physician Assistants and Nurse Practitioners) who all work together to provide you with the care you need, when you need it.  Special Instructions:  . Stay safe, stay home, wash your hands for at least 20 seconds and wear a mask when out in public.  . It was good to talk with you today.    Call the [place] office at [phone] if you have any questions, problems or concerns.

## 2019-10-06 ENCOUNTER — Other Ambulatory Visit: Payer: Self-pay | Admitting: Endocrinology

## 2019-11-05 ENCOUNTER — Other Ambulatory Visit: Payer: Medicare Other

## 2019-11-06 ENCOUNTER — Other Ambulatory Visit (INDEPENDENT_AMBULATORY_CARE_PROVIDER_SITE_OTHER): Payer: Medicare Other

## 2019-11-06 ENCOUNTER — Other Ambulatory Visit: Payer: Medicare Other

## 2019-11-06 ENCOUNTER — Other Ambulatory Visit: Payer: Self-pay | Admitting: Endocrinology

## 2019-11-06 ENCOUNTER — Other Ambulatory Visit: Payer: Self-pay

## 2019-11-06 DIAGNOSIS — Z794 Long term (current) use of insulin: Secondary | ICD-10-CM

## 2019-11-06 DIAGNOSIS — E063 Autoimmune thyroiditis: Secondary | ICD-10-CM

## 2019-11-06 DIAGNOSIS — E7800 Pure hypercholesterolemia, unspecified: Secondary | ICD-10-CM | POA: Diagnosis not present

## 2019-11-06 DIAGNOSIS — E1165 Type 2 diabetes mellitus with hyperglycemia: Secondary | ICD-10-CM | POA: Diagnosis not present

## 2019-11-06 LAB — COMPREHENSIVE METABOLIC PANEL
ALT: 9 U/L (ref 0–35)
AST: 17 U/L (ref 0–37)
Albumin: 4.2 g/dL (ref 3.5–5.2)
Alkaline Phosphatase: 44 U/L (ref 39–117)
BUN: 22 mg/dL (ref 6–23)
CO2: 30 mEq/L (ref 19–32)
Calcium: 9.7 mg/dL (ref 8.4–10.5)
Chloride: 100 mEq/L (ref 96–112)
Creatinine, Ser: 1.01 mg/dL (ref 0.40–1.20)
GFR: 63.09 mL/min (ref >60.00)
Glucose, Bld: 176 mg/dL — ABNORMAL HIGH (ref 70–99)
Potassium: 3.8 mEq/L (ref 3.5–5.1)
Sodium: 136 mEq/L (ref 135–145)
Total Bilirubin: 0.4 mg/dL (ref 0.2–1.2)
Total Protein: 7.8 g/dL (ref 6.0–8.3)

## 2019-11-06 LAB — LIPID PANEL
Cholesterol: 124 mg/dL (ref 0–200)
HDL: 46.3 mg/dL (ref >39.00)
LDL Cholesterol: 67 mg/dL (ref 0–99)
NonHDL: 77.92
Total CHOL/HDL Ratio: 3
Triglycerides: 57 mg/dL (ref 0.0–149.0)
VLDL: 11.4 mg/dL (ref 0.0–40.0)

## 2019-11-06 LAB — TSH: TSH: 1.57 u[IU]/mL (ref 0.35–4.50)

## 2019-11-06 LAB — HEMOGLOBIN A1C: Hgb A1c MFr Bld: 7.8 % — ABNORMAL HIGH (ref 4.6–6.5)

## 2019-11-10 ENCOUNTER — Encounter: Payer: Self-pay | Admitting: Endocrinology

## 2019-11-10 ENCOUNTER — Ambulatory Visit (INDEPENDENT_AMBULATORY_CARE_PROVIDER_SITE_OTHER): Payer: Medicare Other | Admitting: Endocrinology

## 2019-11-10 ENCOUNTER — Other Ambulatory Visit: Payer: Self-pay

## 2019-11-10 VITALS — BP 126/78 | HR 59 | Ht 64.0 in | Wt 176.6 lb

## 2019-11-10 DIAGNOSIS — E063 Autoimmune thyroiditis: Secondary | ICD-10-CM

## 2019-11-10 DIAGNOSIS — E1165 Type 2 diabetes mellitus with hyperglycemia: Secondary | ICD-10-CM

## 2019-11-10 DIAGNOSIS — Z794 Long term (current) use of insulin: Secondary | ICD-10-CM

## 2019-11-10 DIAGNOSIS — E7800 Pure hypercholesterolemia, unspecified: Secondary | ICD-10-CM

## 2019-11-10 DIAGNOSIS — I10 Essential (primary) hypertension: Secondary | ICD-10-CM | POA: Diagnosis not present

## 2019-11-10 DIAGNOSIS — I259 Chronic ischemic heart disease, unspecified: Secondary | ICD-10-CM

## 2019-11-10 MED ORDER — SYNJARDY 5-500 MG PO TABS
1.0000 | ORAL_TABLET | Freq: Two times a day (BID) | ORAL | 3 refills | Status: DC
Start: 2019-11-10 — End: 2020-06-15

## 2019-11-10 NOTE — Patient Instructions (Addendum)
Humalog 7-8 at least at lunch and verify sugar 2-3 hrs later  Take 2-4 at dinner for larger meals, hi sugars or eating carbs  Check blood sugars on waking up 4 days a week  Also check blood sugars about 2 hours after meals and do this after different meals by rotation  Recommended blood sugar levels on waking up are 90-130 and about 2 hours after meal is 130-180  Please bring your blood sugar monitor to each visit, thank you

## 2019-11-10 NOTE — Progress Notes (Signed)
[name] 86 y.o.            Reason for Appointment: Endocrinology follow-up   History of Present Illness   Diagnosis: Type 2 DIABETES MELITUS, date of diagnosis:   2000   She has been on insulin since 2004 with difficulty controlling adequately because of compliance with diet and variability in blood sugars. Her previous records are not available at present, however she has been on basal insulin with mealtime coverage since at least 2007 She has been tried on Byetta previously but did not tolerate this well She tried Victoza but didn't not benefit from Victoza 0.6 dosage; had nausea from 1.2 mg; did not continue this because of this side effect She also was given a trial of the V-go pump but she subjectively did not like this  RECENT history:    INSULIN: Toujeo 22 units in morning, HUMALOG 6 units Acb, 6-7 lunch and 4-6 at supper    Oral hypoglycemic drugs: Metformin ER 750 mg in pm and Synjardy XR 01/999, half pcb   A1c is about the same at 7.8  She has the following blood sugar patterns and problems identified  She has not been checking her blood sugars as much  Also usually not checking readings after meals including lunchtime which is her biggest meal  She was told to take up to 10 units of Humalog at lunchtime but she still takes only 6 units mostly  With this her blood sugars are usually high at dinnertime unless she is eating a small lunch  She takes half a tablet of the Synjardy XR but she thinks this is too big her tablet  No blood sugars at lunchtime to help adjust her morning dose also  She thinks that sometimes she has low sugars around 11 PM and she thinks this is from Metformin  However she is usually not eating any carbohydrates at dinnertime and blood sugars probably are lower after dinner, not checked recently had no documented low sugars when she has symptoms  She is treating HYPOGLYCEMIA with peanut butter crackers and juice  She does a lot  of walking but not regularly  Weight has gone up slightly  FASTING blood sugars are variably high      .         Monitors blood glucose:  about 2-3 times daily     Glucometer: One Touch Verio.          Blood Glucose readings from download:    PRE-MEAL Fasting Lunch  5-7 PM Bedtime Overall  Glucose range:  115-192   134-230  113   Mean/median: 154   184  174   POST-MEAL PC Breakfast PC Lunch PC Dinner  Glucose range:    83-263  Mean/median:    184   Previous readings:  PRE-MEAL Fasting Lunch Dinner Bedtime Overall  Glucose range:  94-203      Mean/median:  154 213    161   POST-MEAL PC Breakfast PC Lunch PC Dinner  Glucose range:   193-376  93-246  Mean/median:   249  117     Meals: 3 meals per day:  breakfast is At 7 am;  lunch 12-2 pm, dinner 5 pm Intake is quite variable at different meals, usually lighter supper    Wt Readings from Last 3 Encounters:  11/10/19 176 lb 9.6 oz (80.1 kg)  10/01/19 172 lb 1.9 oz (78.1 kg)  08/11/19 173 lb 9.6 oz (78.7 kg)  Lab Results  Component Value Date   HGBA1C 7.8 (H) 11/06/2019   HGBA1C 8.0 (H) 08/06/2019   HGBA1C 7.7 (H) 04/22/2019   Lab Results  Component Value Date   MICROALBUR 77.7 (H) 08/06/2019   LDLCALC 67 11/06/2019   CREATININE 1.01 11/06/2019    Office Visit on 11/10/2019  Component Date Value Ref Range Status  . HM Diabetic Foot Exam 09/17/2019 Normal   Final  Lab on 11/06/2019  Component Date Value Ref Range Status  . Cholesterol 11/06/2019 124  0 - 200 mg/dL Final   ATP III Classification       Desirable:  < 200 mg/dL               Borderline High:  200 - 239 mg/dL          High:  > = 240 mg/dL  . Triglycerides 11/06/2019 57.0  0 - 149 mg/dL Final   Normal:  <150 mg/dLBorderline High:  150 - 199 mg/dL  . HDL 11/06/2019 46.30  >39.00 mg/dL Final  . VLDL 11/06/2019 11.4  0.0 - 40.0 mg/dL Final  . LDL Cholesterol 11/06/2019 67  0 - 99 mg/dL Final  . Total CHOL/HDL Ratio 11/06/2019 3   Final                   Men          Women1/2 Average Risk     3.4          3.3Average Risk          5.0          4.42X Average Risk          9.6          7.13X Average Risk          15.0          11.0                      . NonHDL 11/06/2019 77.92   Final   NOTE:  Non-HDL goal should be 30 mg/dL higher than patient's LDL goal (i.e. LDL goal of < 70 mg/dL, would have non-HDL goal of < 100 mg/dL)  . TSH 11/06/2019 1.57  0.35 - 4.50 uIU/mL Final  . Sodium 11/06/2019 136  135 - 145 mEq/L Final  . Potassium 11/06/2019 3.8  3.5 - 5.1 mEq/L Final  . Chloride 11/06/2019 100  96 - 112 mEq/L Final  . CO2 11/06/2019 30  19 - 32 mEq/L Final  . Glucose, Bld 11/06/2019 176* 70 - 99 mg/dL Final  . BUN 11/06/2019 22  6 - 23 mg/dL Final  . Creatinine, Ser 11/06/2019 1.01  0.40 - 1.20 mg/dL Final  . Total Bilirubin 11/06/2019 0.4  0.2 - 1.2 mg/dL Final  . Alkaline Phosphatase 11/06/2019 44  39 - 117 U/L Final  . AST 11/06/2019 17  0 - 37 U/L Final  . ALT 11/06/2019 9  0 - 35 U/L Final  . Total Protein 11/06/2019 7.8  6.0 - 8.3 g/dL Final  . Albumin 11/06/2019 4.2  3.5 - 5.2 g/dL Final  . GFR 11/06/2019 63.09  >60.00 mL/min Final  . Calcium 11/06/2019 9.7  8.4 - 10.5 mg/dL Final  . Hgb A1c MFr Bld 11/06/2019 7.8* 4.6 - 6.5 % Final   Glycemic Control Guidelines for People with Diabetes:Non Diabetic:  <6%Goal of Therapy: <7%Additional Action Suggested:  >8%      Allergies as of 11/10/2019  No Known Allergies     Medication List       Accurate as of November 10, 2019 11:46 AM. If you have any questions, ask your nurse or doctor.        STOP taking these medications   Synjardy XR 01-999 MG Tb24 Generic drug: Empagliflozin-metFORMIN HCl ER Replaced by: Iva Boop 5-500 MG Tabs Stopped by: Elayne Snare, MD     TAKE these medications   Accu-Chek FastClix Lancets Misc Use Accu Chek Fastclix to check blood sugar 4 times daily.   Accu-Chek Guide Me w/Device Kit 1 each by Does not apply route 4 (four) times daily. Use accu  chek guide me device to check blood sugar 4 times daily.   aspirin EC 81 MG tablet Take 1 tablet (81 mg total) by mouth daily.   benazepril 20 MG tablet Commonly known as: LOTENSIN Take 1 tablet (20 mg total) by mouth 2 (two) times daily.   carvedilol 12.5 MG tablet Commonly known as: COREG TAKE 1 TABLET BY MOUTH TWICE A DAY   cholecalciferol 1000 units tablet Commonly known as: VITAMIN D Take 1,000 Units by mouth daily.   Co Q-10 100 MG Caps Take 1 capsule by mouth 2 (two) times daily.   furosemide 20 MG tablet Commonly known as: LASIX Take 1 tablet (20 mg total) by mouth daily as needed for fluid or edema (or weight gain).   glucose blood test strip Use Accu Chek Guide test strips as instructed to check blood sugar 4 times daily.   glucose blood test strip Use Onetouch verio test strips as instructed to check blood sugar 4 times daily. DX:E11.65   HumaLOG KwikPen 100 UNIT/ML KwikPen Generic drug: insulin lispro INJECT 6 TO 8 UNITS INTO THE SKIN TWICE DAILY   levothyroxine 75 MCG tablet Commonly known as: SYNTHROID TAKE 1 TABLET BY MOUTH EVERY DAY   metFORMIN 750 MG 24 hr tablet Commonly known as: GLUCOPHAGE-XR TAKE 1 TABLET BY MOUTH EVERY DAY   multivitamins ther. w/minerals Tabs tablet Take 1 tablet by mouth daily.   Pen Needles 30G X 5 MM Misc 1 each by Does not apply route 3 (three) times daily. Use one pen needle to inject medication three times daily.   rosuvastatin 20 MG tablet Commonly known as: CRESTOR TAKE 1 TABLET BY MOUTH EVERY DAY   Synjardy 5-500 MG Tabs Generic drug: Empagliflozin-metFORMIN HCl Take 1 tablet by mouth 2 (two) times daily. Replaces: Synjardy XR 01-999 MG Tb24 Started by: Elayne Snare, MD   Toujeo SoloStar 300 UNIT/ML Solostar Pen Generic drug: insulin glargine (1 Unit Dial) ADMINISTER 22 UNITS UNDER THE SKIN DAILY   Tums 500 MG chewable tablet Generic drug: calcium carbonate Chew 1 tablet by mouth daily as needed for  heartburn (indigestion).   vitamin B-12 100 MCG tablet Commonly known as: CYANOCOBALAMIN Take 100 mcg by mouth daily.       Allergies: No Known Allergies  Past Medical History:  Diagnosis Date  . Aortic valve disorders    S/P AVR 06/2012  . Arthritis   . Asthma    AS CHILD   . Depression   . Diabetes mellitus   . Heart murmur   . Hyperlipidemia   . Hypertension   . Hypothyroidism   . Ischemic heart disease    with prior CABG x 2 in 2014 along with AVR  . Lumbago   . Shortness of breath   . Thyroid disease     Past Surgical History:  Procedure Laterality  Date  . AORTIC VALVE REPLACEMENT N/A 06/25/2012   Procedure: AORTIC VALVE REPLACEMENT (AVR);  Surgeon: Grace Isaac, MD;  Location: [facility];  Service: Open Heart Surgery;  Laterality: N/A;  . BREAST BIOPSY Right 08/2016   benign  . BREAST SURGERY     LT BREAST BX BENIGN  . CORONARY ARTERY BYPASS GRAFT N/A 06/25/2012   Procedure: Coronary artery bypass graft  times two using left internal mammary artery and right leg greater saphenous vein;  Surgeon: Grace Isaac, MD;  Location: [facility];  Service: Open Heart Surgery;  Laterality: N/A;  . INTRAOPERATIVE TRANSESOPHAGEAL ECHOCARDIOGRAM N/A 06/25/2012   Procedure: INTRAOPERATIVE TRANSESOPHAGEAL ECHOCARDIOGRAM;  Surgeon: Grace Isaac, MD;  Location: [facility];  Service: Open Heart Surgery;  Laterality: N/A;  . TUBAL LIGATION      Family History  Problem Relation Age of Onset  . Heart disease Father     Social History:  reports that she has never smoked. She has never used smokeless tobacco. She reports that she does not drink alcohol and does not use drugs.  Review of Systems:   HYPERTENSION: Blood pressure is followed by PCP and cardiologist Treatment regimen includes benazepril and carvedilol   BP Readings from Last 3 Encounters:  11/10/19 126/78  10/01/19 128/72  08/11/19 140/76     HYPOTHYROIDISM: She has had long-standing hypothyroidism  She is  regular with her levothyroxine 75 g dose She feels fairly good  TSH has been consistently in the normal range   Lab Results  Component Value Date   TSH 1.57 11/06/2019   TSH 1.34 04/22/2019   TSH 1.38 02/25/2019   FREET4 1.22 04/22/2019   FREET4 1.03 02/25/2019   FREET4 1.02 08/19/2018    HYPERLIPIDEMIA: The lipid abnormality consists of elevated LDL; has been  controlled with Crestor 20 mg LDL is below 70  Lab Results  Component Value Date   CHOL 124 11/06/2019   HDL 46.30 11/06/2019   LDLCALC 67 11/06/2019   LDLDIRECT 102.8 11/28/2013   TRIG 57.0 11/06/2019   CHOLHDL 3 11/06/2019  '    She does see a podiatrist regularly for onychomycosis  Last foot exam was in 5/21 with podiatrist: No sensory loss on exam  DEPRESSION: Previously on Lexapro from her PCP     Examination:   BP 126/78 (BP Location: Left Arm, Patient Position: Sitting, Cuff Size: Large)   Pulse (!) 59   Ht 5' 4"  (1.626 m)   Wt 176 lb 9.6 oz (80.1 kg)   SpO2 98%   BMI 30.31 kg/m   Body mass index is 30.31 kg/m.    ASSESSMENT/ PLAN:   Diabetes type 2, uncontrolled   See history of present illness for a discussion of the blood sugar patterns, recent management and problems identified  A1c is still high at 7.8  She has mostly high postprandial readings as before This is usually documented in the afternoons and before dinnertime She is not checking blood sugars enough especially 2 hours after meals  She will still not adjusting her mealtime dose based on what she is eating and likely taking too much to cover her dinner Currently able to afford Humalog since she is getting patient assistance program  Recommendations:  She will be given the 5/500 Synjardy since she does not like to take the large tablet in half  Likely needs 2 to 3 units more at least for her lunch meals when eating a proper meal  Only needs 2-4 units to  cover her evening meal especially if blood sugars are high but  otherwise for meals like salads she can skip  Discussed proper treatment of hypoglycemia with simple sugars and not eating peanut butter and crackers all the time  Discussed when to check her blood sugars and targets  Call if fasting blood sugars are consistently controlled  HYPERTENSION: Well controlled    Hypothyroidism: Treated with 75 mcg and TSH has been normal  Lipids: Well-controlled on Crestor 20 mg and will continue     Patient Instructions  Humalog 7-8 at least at lunch and verify sugar 2-3 hrs later  Take 2-4 at dinner for larger meals, hi sugars or eating carbs  Check blood sugars on waking up 4 days a week  Also check blood sugars about 2 hours after meals and do this after different meals by rotation  Recommended blood sugar levels on waking up are 90-130 and about 2 hours after meal is 130-180  Please bring your blood sugar monitor to each visit, thank you           Elayne Snare 11/10/2019, 11:46 AM   Note: This office note was prepared with Dragon voice recognition system technology. Any transcriptional errors that result from this process are unintentional.

## 2019-12-10 ENCOUNTER — Other Ambulatory Visit: Payer: Self-pay | Admitting: Endocrinology

## 2019-12-17 ENCOUNTER — Ambulatory Visit (INDEPENDENT_AMBULATORY_CARE_PROVIDER_SITE_OTHER): Payer: Medicare Other | Admitting: Podiatry

## 2019-12-17 ENCOUNTER — Encounter: Payer: Self-pay | Admitting: Podiatry

## 2019-12-17 ENCOUNTER — Other Ambulatory Visit: Payer: Self-pay

## 2019-12-17 DIAGNOSIS — B351 Tinea unguium: Secondary | ICD-10-CM | POA: Diagnosis not present

## 2019-12-17 DIAGNOSIS — E1151 Type 2 diabetes mellitus with diabetic peripheral angiopathy without gangrene: Secondary | ICD-10-CM

## 2019-12-17 DIAGNOSIS — M79676 Pain in unspecified toe(s): Secondary | ICD-10-CM

## 2019-12-17 NOTE — Progress Notes (Signed)
This patient returns to my office for at risk foot care.  This patient requires this care by a professional since this patient will be at risk due to having  Type 2 diabetes.    This patient is unable to cut nails herself since the patient cannot reach her nails.These nails are painful walking and wearing shoes.  This patient presents for at risk foot care today.  General Appearance  Alert, conversant and in no acute stress.  Vascular  Dorsalis pedis  pulses are palpable  bilaterally. Posterior tibial pulses are absent  B/L.  Capillary return is within normal limits  bilaterally. Temperature is within normal limits  bilaterally.  Neurologic  Senn-Weinstein monofilament wire test within normal limits  bilaterally. Muscle power within normal limits bilaterally.  Nails Thick disfigured discolored nails with subungual debris  from hallux to fifth toes bilaterally. No evidence of bacterial infection or drainage bilaterally.  Orthopedic  No limitations of motion  feet .  No crepitus or effusions noted.  HAV  B/L.  Hammer toe second right.  Skin  normotropic skin with no porokeratosis noted bilaterally.  No signs of infections or ulcers noted.   HD second right asymptomatic.  Onychomycosis  Pain in right toes  Pain in left toes  HD 2nd toe right  Consent was obtained for treatment procedures.   Mechanical debridement of nails 1-5  bilaterally performed with a nail nipper.  Filed with dremel without incident.    Return office visit  12 weeks                   Told patient to return for periodic foot care and evaluation due to potential at risk complications.   Gracelee Stemmler DPM  

## 2019-12-29 ENCOUNTER — Other Ambulatory Visit: Payer: Self-pay | Admitting: Nurse Practitioner

## 2019-12-29 ENCOUNTER — Other Ambulatory Visit: Payer: Self-pay | Admitting: Endocrinology

## 2020-01-13 ENCOUNTER — Other Ambulatory Visit: Payer: Self-pay | Admitting: Endocrinology

## 2020-02-01 NOTE — Progress Notes (Signed)
CARDIOLOGY OFFICE NOTE  Date:  02/10/2020    [name] Date of Birth: 1932-12-17 Medical Record #[id number]  PCP:  Rogers Blocker, MD  Cardiologist:  Servando Snare Nahser    Chief Complaint  Patient presents with  . Follow-up    Seen for Dr. Acie Fredrickson    History of Present Illness: [name] is a 86 y.o. female who presents today for a follow up visit. Seen for Dr. Acie Fredrickson. She primarily follows with me.   She has a history of DM, prior AVR and CABG x 2 per EBG back in 2014 - this was with a LIMA to the LAD and SVG to the LCX,with a pericardial Edwards tissue valve #21 mm. Did have post op atrial fib - was on amiodaronefor a short time. Other issues include HLD, HTN, DM, depression and hypothyroidism. Myoview ok from 06/2013. Last echo 12/2014.   I have followed her over the years. Now on some diuretic for her breathing. She tends to get depressed. Struggles with weight loss and has chronic fatigue. Worse with the pandemic. In September of 2020 she was noted to be on 2 beta blockers - I stopped the Bystolic - she improved with more energy and less depression on follow up in October - but had also had a family member pay for a roof that had brought her some financial relief. Cardiac status otherwise, ok.Tends to start and stop her antidepressant therapy. Worried about her memory. Watching TV most of the day. Last seen in June - had gotten some assistance with her insulin so she was taking more regularly - A1C had gone up. Back off Lexapro. Overall felt to be stable from our standpoint.  Comes in today. Here alone today. She has been started on some Namenda for her memory. She admits she gets easily depressed at times. No chest pain. Breathing is ok. Very limited activity. No falls. Tolerating her medicines. She had labs yesterday - potassium ok - A1C is elevated. She sees Dr. Dwyane Dee later this week.   Past Medical History:  Diagnosis Date  . Aortic valve disorders     S/P AVR 06/2012  . Arthritis   . Asthma    AS CHILD   . Depression   . Diabetes mellitus   . Heart murmur   . Hyperlipidemia   . Hypertension   . Hypothyroidism   . Ischemic heart disease    with prior CABG x 2 in 2014 along with AVR  . Lumbago   . Shortness of breath   . Thyroid disease     Past Surgical History:  Procedure Laterality Date  . AORTIC VALVE REPLACEMENT N/A 06/25/2012   Procedure: AORTIC VALVE REPLACEMENT (AVR);  Surgeon: Grace Isaac, MD;  Location: [facility];  Service: Open Heart Surgery;  Laterality: N/A;  . BREAST BIOPSY Right 08/2016   benign  . BREAST SURGERY     LT BREAST BX BENIGN  . CORONARY ARTERY BYPASS GRAFT N/A 06/25/2012   Procedure: Coronary artery bypass graft  times two using left internal mammary artery and right leg greater saphenous vein;  Surgeon: Grace Isaac, MD;  Location: [facility];  Service: Open Heart Surgery;  Laterality: N/A;  . INTRAOPERATIVE TRANSESOPHAGEAL ECHOCARDIOGRAM N/A 06/25/2012   Procedure: INTRAOPERATIVE TRANSESOPHAGEAL ECHOCARDIOGRAM;  Surgeon: Grace Isaac, MD;  Location: [facility];  Service: Open Heart Surgery;  Laterality: N/A;  . TUBAL LIGATION       Medications: Current Meds  Medication  Sig  . Accu-Chek FastClix Lancets MISC Use Accu Chek Fastclix to check blood sugar 4 times daily.  Marland Kitchen aspirin EC 81 MG tablet Take 1 tablet (81 mg total) by mouth daily.  . benazepril (LOTENSIN) 20 MG tablet TAKE 1 TABLET(20 MG) BY MOUTH TWICE DAILY  . Blood Glucose Monitoring Suppl (ACCU-CHEK GUIDE ME) w/Device KIT 1 each by Does not apply route 4 (four) times daily. Use accu chek guide me device to check blood sugar 4 times daily.  . calcium carbonate (TUMS) 500 MG chewable tablet Chew 1 tablet by mouth daily as needed for heartburn (indigestion).  . carvedilol (COREG) 12.5 MG tablet TAKE 1 TABLET BY MOUTH TWICE A DAY  . cholecalciferol (VITAMIN D) 1000 UNITS tablet Take 1,000 Units by mouth daily.  . Coenzyme Q10 (CO Q-10) 100 MG  CAPS Take 1 capsule by mouth 2 (two) times daily.  . Empagliflozin-metFORMIN HCl (SYNJARDY) 5-500 MG TABS Take 1 tablet by mouth 2 (two) times daily.  . furosemide (LASIX) 20 MG tablet Take 1 tablet (20 mg total) by mouth daily as needed for fluid or edema (or weight gain).  Marland Kitchen glucose blood test strip Use Accu Chek Guide test strips as instructed to check blood sugar 4 times daily.  Marland Kitchen glucose blood test strip Use Onetouch verio test strips as instructed to check blood sugar 4 times daily. DX:E11.65  . HUMALOG KWIKPEN 100 UNIT/ML KwikPen INJECT 6 TO 8 UNITS INTO THE SKIN TWICE DAILY  . Insulin Pen Needle (PEN NEEDLES) 30G X 5 MM MISC 1 each by Does not apply route 3 (three) times daily. Use one pen needle to inject medication three times daily.  Marland Kitchen levothyroxine (SYNTHROID) 75 MCG tablet TAKE 1 TABLET BY MOUTH EVERY DAY  . memantine (NAMENDA) 5 MG tablet Take 5 mg by mouth at bedtime.  . metFORMIN (GLUCOPHAGE-XR) 750 MG 24 hr tablet TAKE 1 TABLET BY MOUTH EVERY DAY  . Multiple Vitamins-Minerals (MULTIVITAMINS THER. W/MINERALS) TABS Take 1 tablet by mouth daily.  . rosuvastatin (CRESTOR) 20 MG tablet TAKE 1 TABLET BY MOUTH EVERY DAY  . TOUJEO SOLOSTAR 300 UNIT/ML Solostar Pen ADMINISTER 22 UNITS UNDER THE SKIN DAILY  . vitamin B-12 (CYANOCOBALAMIN) 100 MCG tablet Take 100 mcg by mouth daily.     Allergies: No Known Allergies  Social History: The patient  reports that she has never smoked. She has never used smokeless tobacco. She reports that she does not drink alcohol and does not use drugs.   Family History: The patient's family history includes Heart disease in her father.   Review of Systems: Please see the history of present illness.   All other systems are reviewed and negative.   Physical Exam: VS:  BP (!) 142/82   Pulse 63   Ht 5' 3.5" (1.613 m)   Wt 171 lb 12.8 oz (77.9 kg)   SpO2 98%   BMI 29.96 kg/m  .  BMI Body mass index is 29.96 kg/m.  Wt Readings from Last 3  Encounters:  02/10/20 171 lb 12.8 oz (77.9 kg)  11/10/19 176 lb 9.6 oz (80.1 kg)  10/01/19 172 lb 1.9 oz (78.1 kg)    General: Pleasant. Alert and in no acute distress.   Cardiac: Regular rate and rhythm. Valve sounds crisp.  No edema.  Respiratory:  Lungs are clear to auscultation bilaterally with normal work of breathing.  GI: Soft and nontender.  MS: No deformity or atrophy. Gait and ROM intact.  Skin: Warm and dry. Color  is normal.  Neuro:  Strength and sensation are intact and no gross focal deficits noted.  Psych: Alert, appropriate and with normal affect.   LABORATORY DATA:  EKG:  EKG is ordered today.  Personally reviewed by me. This demonstrates NSR with HR of 63 - unchanged.  Lab Results  Component Value Date   WBC 6.4 10/01/2019   HGB 10.4 (L) 10/01/2019   HCT 32.2 (L) 10/01/2019   PLT 202 10/01/2019   GLUCOSE 157 (H) 02/09/2020   CHOL 124 11/06/2019   TRIG 57.0 11/06/2019   HDL 46.30 11/06/2019   LDLDIRECT 102.8 11/28/2013   LDLCALC 67 11/06/2019   ALT 9 11/06/2019   AST 17 11/06/2019   NA 137 02/09/2020   K 3.8 02/09/2020   CL 100 02/09/2020   CREATININE 1.05 02/09/2020   BUN 17 02/09/2020   CO2 29 02/09/2020   TSH 0.85 02/09/2020   INR 1.57 (H) 06/25/2012   HGBA1C 7.9 (H) 02/09/2020   MICROALBUR 77.7 (H) 08/06/2019     BNP (last 3 results) No results for input(s): BNP in the last 8760 hours.  ProBNP (last 3 results) No results for input(s): PROBNP in the last 8760 hours.   Other Studies Reviewed Today:  EchoStudy Conclusions8/2018  - Left ventricle: The cavity size was normal. Systolic function was normal. The estimated ejection fraction was in the range of 55% to 60%. Wall motion was normal; there were no regional wall motion abnormalities. There was an increased relative contribution of atrial contraction to ventricular filling. Doppler parameters are consistent with abnormal left ventricular relaxation (grade 1 diastolic  dysfunction). - Aortic valve: A pericardial bioprosthesis was present and functioning normally. Mean gradient (S): 9 mm Hg. Peak gradient (S): 17 mm Hg. - Mitral valve: Calcified annulus. Mildly thickened leaflets . There was mild to moderate regurgitation. - Left atrium: The atrium was mildly dilated. - Atrial septum: There was increased thickness of the septum, consistent with lipomatous hypertrophy. - Pulmonary arteries: PA peak pressure: 42 mm Hg (S).  Impressions:  - The right ventricular systolic pressure was increased consistent with moderate pulmonary hypertension.   Myoview Impression from 06/2013 Exercise Capacity: Lexiscan with no exercise. BP Response: Normal blood pressure response. Clinical Symptoms: No significant symptoms noted. ECG Impression: There are scattered PVCs. Comparison with Prior Nuclear Study: No previous nuclear study performed  Overall Impression: Low risk stress nuclear study small fixed distal anterior artifact which could be breast attenuation or other artifact.  LV Ejection Fraction: 66%. LV Wall Motion: Normal Wall Motion  [name], MD, [facility]   ASSESSMENT & PLAN:   1. CAD - she had remote CABG/AVR in 2014 - stable Myoview from 2015 - last echo in 2018. She has no worrisome symptoms. Need to consider updating her echo on return.   2. Depression - probably the crux of her issues - this has been chronic - she has been on and off Lexapro - never took consistently.   3. Memory issues - now on Namenda - this is ok from my standpoint.   4. HTN - BP is fair - I have left her on her current regimen.   5. HLD - on statin - lab from earlier this year noted.   6. Chronic anemia - per PCP  7. DM - per Endocrine - seeing him later this week.    Current medicines are reviewed with the patient today.  The patient does not have concerns regarding medicines other than what has been noted  above.  The following  changes have been made:  See above.  Labs/ tests ordered today include:    Orders Placed This Encounter  Procedures  . EKG 12-Lead     Disposition:   FU with me in February - she is aware that I will be leaving later in February. Overall stable from our standpoint today.    Patient is agreeable to this plan and will call if any problems develop in the interim.   SignedTruitt Merle, NP  02/10/2020 9:31 AM  [facility] [address] [place], [place]  [address] Phone: [phone] Fax: [phone]

## 2020-02-09 ENCOUNTER — Other Ambulatory Visit: Payer: Self-pay

## 2020-02-09 ENCOUNTER — Other Ambulatory Visit (INDEPENDENT_AMBULATORY_CARE_PROVIDER_SITE_OTHER): Payer: Medicare Other

## 2020-02-09 DIAGNOSIS — Z794 Long term (current) use of insulin: Secondary | ICD-10-CM | POA: Diagnosis not present

## 2020-02-09 DIAGNOSIS — E063 Autoimmune thyroiditis: Secondary | ICD-10-CM | POA: Diagnosis not present

## 2020-02-09 DIAGNOSIS — E1165 Type 2 diabetes mellitus with hyperglycemia: Secondary | ICD-10-CM

## 2020-02-09 LAB — HEMOGLOBIN A1C: Hgb A1c MFr Bld: 7.9 % — ABNORMAL HIGH (ref 4.6–6.5)

## 2020-02-09 LAB — BASIC METABOLIC PANEL
BUN: 17 mg/dL (ref 6–23)
CO2: 29 mEq/L (ref 19–32)
Calcium: 9.8 mg/dL (ref 8.4–10.5)
Chloride: 100 mEq/L (ref 96–112)
Creatinine, Ser: 1.05 mg/dL (ref 0.40–1.20)
GFR: 48.43 mL/min — ABNORMAL LOW (ref >60.00)
Glucose, Bld: 157 mg/dL — ABNORMAL HIGH (ref 70–99)
Potassium: 3.8 mEq/L (ref 3.5–5.1)
Sodium: 137 mEq/L (ref 135–145)

## 2020-02-09 LAB — TSH: TSH: 0.85 u[IU]/mL (ref 0.35–4.50)

## 2020-02-10 ENCOUNTER — Encounter: Payer: Self-pay | Admitting: Nurse Practitioner

## 2020-02-10 ENCOUNTER — Ambulatory Visit (INDEPENDENT_AMBULATORY_CARE_PROVIDER_SITE_OTHER): Payer: Medicare Other | Admitting: Nurse Practitioner

## 2020-02-10 VITALS — BP 142/82 | HR 63 | Ht 63.5 in | Wt 171.8 lb

## 2020-02-10 DIAGNOSIS — Z951 Presence of aortocoronary bypass graft: Secondary | ICD-10-CM | POA: Diagnosis not present

## 2020-02-10 DIAGNOSIS — I259 Chronic ischemic heart disease, unspecified: Secondary | ICD-10-CM | POA: Diagnosis not present

## 2020-02-10 DIAGNOSIS — Z952 Presence of prosthetic heart valve: Secondary | ICD-10-CM

## 2020-02-10 DIAGNOSIS — I10 Essential (primary) hypertension: Secondary | ICD-10-CM

## 2020-02-10 DIAGNOSIS — F32A Depression, unspecified: Secondary | ICD-10-CM

## 2020-02-10 NOTE — Patient Instructions (Addendum)
After Visit Summary:  We will be checking the following labs today - NONE   Medication Instructions:    Continue with your current medicines.    If you need a refill on your cardiac medications before your next appointment, please call your pharmacy.     Testing/Procedures To Be Arranged:  N/A  Follow-Up:   See me in February    At [facility], you and your health needs are our priority.  As part of our continuing mission to provide you with exceptional heart care, we have created designated Provider Care Teams.  These Care Teams include your primary Cardiologist (physician) and Advanced Practice Providers (APPs -  Physician Assistants and Nurse Practitioners) who all work together to provide you with the care you need, when you need it.  Special Instructions:  . Stay safe, wash your hands for at least 20 seconds and wear a mask when needed.  . It was good to talk with you today.    Call the [facility] office at [phone] if you have any questions, problems or concerns.       

## 2020-02-12 ENCOUNTER — Other Ambulatory Visit: Payer: Self-pay

## 2020-02-12 ENCOUNTER — Ambulatory Visit (INDEPENDENT_AMBULATORY_CARE_PROVIDER_SITE_OTHER): Payer: Medicare Other | Admitting: Endocrinology

## 2020-02-12 VITALS — BP 130/78 | HR 78 | Ht 60.25 in | Wt 169.6 lb

## 2020-02-12 DIAGNOSIS — E1165 Type 2 diabetes mellitus with hyperglycemia: Secondary | ICD-10-CM

## 2020-02-12 DIAGNOSIS — E063 Autoimmune thyroiditis: Secondary | ICD-10-CM

## 2020-02-12 DIAGNOSIS — I10 Essential (primary) hypertension: Secondary | ICD-10-CM | POA: Diagnosis not present

## 2020-02-12 DIAGNOSIS — Z794 Long term (current) use of insulin: Secondary | ICD-10-CM | POA: Diagnosis not present

## 2020-02-12 DIAGNOSIS — I259 Chronic ischemic heart disease, unspecified: Secondary | ICD-10-CM | POA: Diagnosis not present

## 2020-02-12 MED ORDER — TOUJEO SOLOSTAR 300 UNIT/ML ~~LOC~~ SOPN
PEN_INJECTOR | SUBCUTANEOUS | 1 refills | Status: DC
Start: 2020-02-12 — End: 2020-04-12

## 2020-02-12 MED ORDER — GLUCOSE BLOOD VI STRP
ORAL_STRIP | 2 refills | Status: DC
Start: 2020-02-12 — End: 2020-03-08

## 2020-02-12 NOTE — Patient Instructions (Addendum)
If sugar 9-10 is over 180, then go to 8 Humalog in am  Synjarday at Bfst  2 units Less at dinner

## 2020-02-12 NOTE — Progress Notes (Signed)
[name] 86 y.o.            Reason for Appointment: Endocrinology follow-up   History of Present Illness   Diagnosis: Type 2 DIABETES MELITUS, date of diagnosis:   2000   She has been on insulin since 2004 with difficulty controlling adequately because of compliance with diet and variability in blood sugars. Her previous records are not available at present, however she has been on basal insulin with mealtime coverage since at least 2007 She has been tried on Byetta previously but did not tolerate this well She tried Victoza but didn't not benefit from Victoza 0.6 dosage; had nausea from 1.2 mg; did not continue this because of this side effect She also was given a trial of the V-go pump but she subjectively did not like this  RECENT history:    INSULIN: Toujeo 22 units in morning, HUMALOG 6 units before most meals  Oral hypoglycemic drugs: Metformin ER 750 mg in pm and Synjardy  5/500, pcl   A1c is about the same at 7.9  She has the following blood sugar patterns and problems identified  She has checked her sugars more often compared to her last visit especially in the afternoons and evenings  However not checking enough readings after breakfast  HIGHEST blood sugars on average are appearing to be midday and late afternoon  Not clear why her blood sugars tend to be drifting higher before dinnertime also  She has been told to reduce her Humalog at dinnertime because she is eating a small meal but she will frequently take 6 units causing blood sugars to be low normal or low late evening  FASTING blood sugars are more consistently controlled and only rarely increased  She does not do any walking for exercise but may use a recumbent bike at home  Has not adjusted her dose of insulin at lunchtime based on what she is eating  Despite instructions she is only taking her Synjardy after lunch and not in the morning  She appears to have lost some weight from decreased  appetite overall      .         Monitors blood glucose:  about 2-3 times daily     Glucometer: One Touch Verio.          Blood Glucose readings from download:    PRE-MEAL Fasting  midday  5-7 PM Bedtime Overall  Glucose range:  110-196  85-335  71-335  45-199   Mean/median:  141  229  209  132  176+/-74   POST-MEAL PC Breakfast PC Lunch PC Dinner  Glucose range:   117-242   Mean/median:   159  193   Blood sugars previously:  PRE-MEAL Fasting Lunch  5-7 PM Bedtime Overall  Glucose range:  115-192   134-230  113   Mean/median: 154   184  174   POST-MEAL PC Breakfast PC Lunch PC Dinner  Glucose range:    83-263  Mean/median:    184     Meals: 3 meals per day:  breakfast is At 7 am;  lunch 12-2 pm, dinner 5 pm Intake is quite variable at different meals, usually lighter supper    Wt Readings from Last 3 Encounters:  02/12/20 169 lb 9.6 oz (76.9 kg)  02/10/20 171 lb 12.8 oz (77.9 kg)  11/10/19 176 lb 9.6 oz (80.1 kg)    Lab Results  Component Value Date   HGBA1C 7.9 (H) 02/09/2020   HGBA1C  7.8 (H) 11/06/2019   HGBA1C 8.0 (H) 08/06/2019   Lab Results  Component Value Date   MICROALBUR 77.7 (H) 08/06/2019   LDLCALC 67 11/06/2019   CREATININE 1.05 02/09/2020    Lab on 02/09/2020  Component Date Value Ref Range Status  . TSH 02/09/2020 0.85  0.35 - 4.50 uIU/mL Final  . Sodium 02/09/2020 137  135 - 145 mEq/L Final  . Potassium 02/09/2020 3.8  3.5 - 5.1 mEq/L Final  . Chloride 02/09/2020 100  96 - 112 mEq/L Final  . CO2 02/09/2020 29  19 - 32 mEq/L Final  . Glucose, Bld 02/09/2020 157* 70 - 99 mg/dL Final  . BUN 02/09/2020 17  6 - 23 mg/dL Final  . Creatinine, Ser 02/09/2020 1.05  0.40 - 1.20 mg/dL Final  . GFR 02/09/2020 48.43* >60.00 mL/min Final  . Calcium 02/09/2020 9.8  8.4 - 10.5 mg/dL Final  . Hgb A1c MFr Bld 02/09/2020 7.9* 4.6 - 6.5 % Final   Glycemic Control Guidelines for People with Diabetes:Non Diabetic:  <6%Goal of Therapy: <7%Additional Action  Suggested:  >8%      Allergies as of 02/12/2020   No Known Allergies     Medication List       Accurate as of February 12, 2020 10:42 AM. If you have any questions, ask your nurse or doctor.        Accu-Chek FastClix Lancets Misc Use Accu Chek Fastclix to check blood sugar 4 times daily.   Accu-Chek Guide Me w/Device Kit 1 each by Does not apply route 4 (four) times daily. Use accu chek guide me device to check blood sugar 4 times daily.   aspirin EC 81 MG tablet Take 1 tablet (81 mg total) by mouth daily.   benazepril 20 MG tablet Commonly known as: LOTENSIN TAKE 1 TABLET(20 MG) BY MOUTH TWICE DAILY   carvedilol 12.5 MG tablet Commonly known as: COREG TAKE 1 TABLET BY MOUTH TWICE A DAY   cholecalciferol 1000 units tablet Commonly known as: VITAMIN D Take 1,000 Units by mouth daily.   Co Q-10 100 MG Caps Take 1 capsule by mouth 2 (two) times daily.   furosemide 20 MG tablet Commonly known as: LASIX Take 1 tablet (20 mg total) by mouth daily as needed for fluid or edema (or weight gain).   glucose blood test strip Use Accu Chek Guide test strips as instructed to check blood sugar 4 times daily.   glucose blood test strip Use Onetouch verio test strips as instructed to check blood sugar 4 times daily. DX:E11.65   HumaLOG KwikPen 100 UNIT/ML KwikPen Generic drug: insulin lispro INJECT 6 TO 8 UNITS INTO THE SKIN TWICE DAILY   levothyroxine 75 MCG tablet Commonly known as: SYNTHROID TAKE 1 TABLET BY MOUTH EVERY DAY   memantine 5 MG tablet Commonly known as: NAMENDA Take 5 mg by mouth at bedtime.   metFORMIN 750 MG 24 hr tablet Commonly known as: GLUCOPHAGE-XR TAKE 1 TABLET BY MOUTH EVERY DAY   multivitamins ther. w/minerals Tabs tablet Take 1 tablet by mouth daily.   Pen Needles 30G X 5 MM Misc 1 each by Does not apply route 3 (three) times daily. Use one pen needle to inject medication three times daily.   rosuvastatin 20 MG tablet Commonly known as:  CRESTOR TAKE 1 TABLET BY MOUTH EVERY DAY   Synjardy 5-500 MG Tabs Generic drug: Empagliflozin-metFORMIN HCl Take 1 tablet by mouth 2 (two) times daily.   Toujeo SoloStar 300 UNIT/ML [employer]  Pen Generic drug: insulin glargine (1 Unit Dial) ADMINISTER 22 UNITS UNDER THE SKIN DAILY   Tums 500 MG chewable tablet Generic drug: calcium carbonate Chew 1 tablet by mouth daily as needed for heartburn (indigestion).   vitamin B-12 100 MCG tablet Commonly known as: CYANOCOBALAMIN Take 100 mcg by mouth daily.       Allergies: No Known Allergies  Past Medical History:  Diagnosis Date  . Aortic valve disorders    S/P AVR 06/2012  . Arthritis   . Asthma    AS CHILD   . Depression   . Diabetes mellitus   . Heart murmur   . Hyperlipidemia   . Hypertension   . Hypothyroidism   . Ischemic heart disease    with prior CABG x 2 in 2014 along with AVR  . Lumbago   . Shortness of breath   . Thyroid disease     Past Surgical History:  Procedure Laterality Date  . AORTIC VALVE REPLACEMENT N/A 06/25/2012   Procedure: AORTIC VALVE REPLACEMENT (AVR);  Surgeon: Grace Isaac, MD;  Location: [facility];  Service: Open Heart Surgery;  Laterality: N/A;  . BREAST BIOPSY Right 08/2016   benign  . BREAST SURGERY     LT BREAST BX BENIGN  . CORONARY ARTERY BYPASS GRAFT N/A 06/25/2012   Procedure: Coronary artery bypass graft  times two using left internal mammary artery and right leg greater saphenous vein;  Surgeon: Grace Isaac, MD;  Location: [place];  Service: Open Heart Surgery;  Laterality: N/A;  . INTRAOPERATIVE TRANSESOPHAGEAL ECHOCARDIOGRAM N/A 06/25/2012   Procedure: INTRAOPERATIVE TRANSESOPHAGEAL ECHOCARDIOGRAM;  Surgeon: Grace Isaac, MD;  Location: [place];  Service: Open Heart Surgery;  Laterality: N/A;  . TUBAL LIGATION      Family History  Problem Relation Age of Onset  . Heart disease Father     Social History:  reports that she has never smoked. She has never used  smokeless tobacco. She reports that she does not drink alcohol and does not use drugs.  Review of Systems:   HYPERTENSION: Blood pressure is followed by PCP and cardiologist Treatment regimen includes benazepril and carvedilol   BP Readings from Last 3 Encounters:  02/12/20 130/78  02/10/20 (!) 142/82  11/10/19 126/78     HYPOTHYROIDISM: She has had long-standing hypothyroidism  She is regular with her levothyroxine 75 g dose She feels fairly good  TSH has been consistently in the normal range   Lab Results  Component Value Date   TSH 0.85 02/09/2020   TSH 1.57 11/06/2019   TSH 1.34 04/22/2019   FREET4 1.22 04/22/2019   FREET4 1.03 02/25/2019   FREET4 1.02 08/19/2018    HYPERLIPIDEMIA: The lipid abnormality consists of elevated LDL; has been  controlled with Crestor 20 mg LDL is below 70  Lab Results  Component Value Date   CHOL 124 11/06/2019   HDL 46.30 11/06/2019   LDLCALC 67 11/06/2019   LDLDIRECT 102.8 11/28/2013   TRIG 57.0 11/06/2019   CHOLHDL 3 11/06/2019  '    She does see a podiatrist regularly for onychomycosis  Last foot exam was in 5/21 with podiatrist: No sensory loss on exam   Covid vaccine dates: 2/11  And 07/07/19   Examination:   BP 130/78 (BP Location: Left Arm, Patient Position: Sitting, Cuff Size: Normal)   Pulse 78   Ht 5' 0.25" (1.53 m)   Wt 169 lb 9.6 oz (76.9 kg)   SpO2 97%   BMI  32.85 kg/m   Body mass index is 32.85 kg/m.    ASSESSMENT/ PLAN:   Diabetes type 2, uncontrolled   See history of present illness for a discussion of the blood sugar patterns, recent management and problems identified  A1c is still high at 7.9  However this may be reasonably good for her age and long duration of diabetes with comorbid conditions  She has tendency to high postprandial readings especially after breakfast and lunch and possibly from the afternoon snacks with not taking enough Humalog However she probably takes too much Humalog at  dinnertime as blood sugars are low normal or low at bedtime Fasting readings are reasonably good with current Toujeo dosage   Recommendations:  She will need to take her 5/500 Synjardy dose right before starting breakfast instead of after lunch  This may also improve her readings midday  However she needs to likely take 8 units at breakfast especially if her readings midmorning are over 180  Encourage her to start walking regularly  Reduce suppertime HUMALOG to only 3 to 4 units unless eating a full meal  Continue Metformin at dinnertime  She will think about doing the freestyle libre on the next visit, currently does not want to change despite showing her the information on the freestyle libre and the advantages of using this  HYPERTENSION: Well controlled on above regimen    Hypothyroidism: Controlled with 75 mcg and TSH again normal  Recommended taking the booster Covid shot     There are no Patient Instructions on file for this visit.       Elayne Snare 02/12/2020, 10:42 AM   Note: This office note was prepared with Dragon voice recognition system technology. Any transcriptional errors that result from this process are unintentional.

## 2020-02-16 ENCOUNTER — Ambulatory Visit: Payer: Medicare Other | Attending: Internal Medicine

## 2020-02-16 DIAGNOSIS — Z23 Encounter for immunization: Secondary | ICD-10-CM

## 2020-02-16 NOTE — Progress Notes (Signed)
   Covid-19 Vaccination Clinic  Name:  [name]    MRN: [id number] DOB: [birth year]  02/16/2020  [name] was observed post Covid-19 immunization for 15 minutes without incident. She was provided with Vaccine Information Sheet and instruction to access the V-Safe system.   [name] was instructed to call 911 with any severe reactions post vaccine: Marland Kitchen Difficulty breathing  . Swelling of face and throat  . A fast heartbeat  . A bad rash all over body  . Dizziness and weakness

## 2020-03-08 ENCOUNTER — Other Ambulatory Visit: Payer: Self-pay | Admitting: Endocrinology

## 2020-03-09 ENCOUNTER — Telehealth: Payer: Self-pay | Admitting: Endocrinology

## 2020-03-09 NOTE — Telephone Encounter (Signed)
F/u   Pt asking for a call back from the nurse regarding this morning messages.

## 2020-03-09 NOTE — Telephone Encounter (Signed)
Patient called and requested a call back from Dr Ronnie Derby nurse.  Declined medication refill, reaction. Refused to say what the message, question was regarding.  Call back number [phone]

## 2020-03-24 ENCOUNTER — Ambulatory Visit (INDEPENDENT_AMBULATORY_CARE_PROVIDER_SITE_OTHER): Payer: Medicare Other | Admitting: Podiatry

## 2020-03-24 ENCOUNTER — Other Ambulatory Visit: Payer: Self-pay

## 2020-03-24 ENCOUNTER — Encounter: Payer: Self-pay | Admitting: Podiatry

## 2020-03-24 DIAGNOSIS — E1151 Type 2 diabetes mellitus with diabetic peripheral angiopathy without gangrene: Secondary | ICD-10-CM | POA: Diagnosis not present

## 2020-03-24 DIAGNOSIS — M79676 Pain in unspecified toe(s): Secondary | ICD-10-CM

## 2020-03-24 DIAGNOSIS — M2041 Other hammer toe(s) (acquired), right foot: Secondary | ICD-10-CM

## 2020-03-24 DIAGNOSIS — B351 Tinea unguium: Secondary | ICD-10-CM

## 2020-03-24 NOTE — Progress Notes (Signed)
This patient returns to my office for at risk foot care.  This patient requires this care by a professional since this patient will be at risk due to having  Type 2 diabetes.    This patient is unable to cut nails herself since the patient cannot reach her nails.These nails are painful walking and wearing shoes.  This patient presents for at risk foot care today.  General Appearance  Alert, conversant and in no acute stress.  Vascular  Dorsalis pedis  pulses are palpable  bilaterally. Posterior tibial pulses are absent  B/L.  Capillary return is within normal limits  bilaterally. Temperature is within normal limits  bilaterally.  Neurologic  Senn-Weinstein monofilament wire test within normal limits  bilaterally. Muscle power within normal limits bilaterally.  Nails Thick disfigured discolored nails with subungual debris  from hallux to fifth toes bilaterally. No evidence of bacterial infection or drainage bilaterally.  Orthopedic  No limitations of motion  feet .  No crepitus or effusions noted.  HAV  B/L.  Hammer toe second right.  Skin  normotropic skin with no porokeratosis noted bilaterally.  No signs of infections or ulcers noted.   HD second right asymptomatic.  Onychomycosis  Pain in right toes  Pain in left toes  HD 2nd toe right  Consent was obtained for treatment procedures.   Mechanical debridement of nails 1-5  bilaterally performed with a nail nipper.  Filed with dremel without incident.    Return office visit  12 weeks                   Told patient to return for periodic foot care and evaluation due to potential at risk complications.   Gardiner Barefoot DPM

## 2020-03-29 ENCOUNTER — Other Ambulatory Visit: Payer: Self-pay | Admitting: Internal Medicine

## 2020-03-29 ENCOUNTER — Other Ambulatory Visit: Payer: Self-pay | Admitting: Endocrinology

## 2020-03-29 DIAGNOSIS — Z1231 Encounter for screening mammogram for malignant neoplasm of breast: Secondary | ICD-10-CM

## 2020-03-30 NOTE — Telephone Encounter (Signed)
Pt request Suanne Marker call her back on Cell [phone]. Pt states she lefts some papers with Suanne Marker & only she will know what this is about. Pt states it is very important.

## 2020-04-07 ENCOUNTER — Other Ambulatory Visit: Payer: Self-pay | Admitting: Endocrinology

## 2020-04-08 ENCOUNTER — Other Ambulatory Visit: Payer: Self-pay | Admitting: Nurse Practitioner

## 2020-04-12 ENCOUNTER — Other Ambulatory Visit: Payer: Self-pay | Admitting: Endocrinology

## 2020-04-17 ENCOUNTER — Other Ambulatory Visit: Payer: Self-pay | Admitting: Endocrinology

## 2020-05-10 ENCOUNTER — Ambulatory Visit
Admission: RE | Admit: 2020-05-10 | Discharge: 2020-05-10 | Disposition: A | Discharge status: Home / Self Care | Payer: Medicare Other | Source: Ambulatory Visit | Attending: Internal Medicine | Admitting: Internal Medicine

## 2020-05-10 ENCOUNTER — Other Ambulatory Visit: Payer: Self-pay

## 2020-05-10 DIAGNOSIS — Z1231 Encounter for screening mammogram for malignant neoplasm of breast: Secondary | ICD-10-CM

## 2020-05-14 ENCOUNTER — Other Ambulatory Visit: Payer: Self-pay

## 2020-05-14 ENCOUNTER — Other Ambulatory Visit (INDEPENDENT_AMBULATORY_CARE_PROVIDER_SITE_OTHER): Payer: Medicare Other

## 2020-05-14 DIAGNOSIS — Z794 Long term (current) use of insulin: Secondary | ICD-10-CM

## 2020-05-14 DIAGNOSIS — E1165 Type 2 diabetes mellitus with hyperglycemia: Secondary | ICD-10-CM

## 2020-05-14 LAB — COMPREHENSIVE METABOLIC PANEL
ALT: 10 U/L (ref 0–35)
AST: 18 U/L (ref 0–37)
Albumin: 4 g/dL (ref 3.5–5.2)
Alkaline Phosphatase: 41 U/L (ref 39–117)
BUN: 25 mg/dL — ABNORMAL HIGH (ref 6–23)
CO2: 29 mEq/L (ref 19–32)
Calcium: 9.5 mg/dL (ref 8.4–10.5)
Chloride: 101 mEq/L (ref 96–112)
Creatinine, Ser: 1 mg/dL (ref 0.40–1.20)
GFR: 51.51 mL/min — ABNORMAL LOW (ref >60.00)
Glucose, Bld: 173 mg/dL — ABNORMAL HIGH (ref 70–99)
Potassium: 3.6 mEq/L (ref 3.5–5.1)
Sodium: 137 mEq/L (ref 135–145)
Total Bilirubin: 0.5 mg/dL (ref 0.2–1.2)
Total Protein: 7.6 g/dL (ref 6.0–8.3)

## 2020-05-14 LAB — HEMOGLOBIN A1C: Hgb A1c MFr Bld: 7.8 % — ABNORMAL HIGH (ref 4.6–6.5)

## 2020-05-17 ENCOUNTER — Other Ambulatory Visit: Payer: Medicare Other

## 2020-05-19 ENCOUNTER — Other Ambulatory Visit: Payer: Self-pay

## 2020-05-20 ENCOUNTER — Ambulatory Visit (INDEPENDENT_AMBULATORY_CARE_PROVIDER_SITE_OTHER): Payer: Medicare Other | Admitting: Endocrinology

## 2020-05-20 ENCOUNTER — Encounter: Payer: Self-pay | Admitting: Endocrinology

## 2020-05-20 VITALS — BP 126/76 | HR 67 | Ht 60.0 in | Wt 174.2 lb

## 2020-05-20 DIAGNOSIS — Z794 Long term (current) use of insulin: Secondary | ICD-10-CM | POA: Diagnosis not present

## 2020-05-20 DIAGNOSIS — I10 Essential (primary) hypertension: Secondary | ICD-10-CM

## 2020-05-20 DIAGNOSIS — E063 Autoimmune thyroiditis: Secondary | ICD-10-CM | POA: Diagnosis not present

## 2020-05-20 DIAGNOSIS — E1165 Type 2 diabetes mellitus with hyperglycemia: Secondary | ICD-10-CM | POA: Diagnosis not present

## 2020-05-20 NOTE — Progress Notes (Signed)
[name] 87 y.o.            Reason for Appointment: Endocrinology follow-up   History of Present Illness   Diagnosis: Type 2 DIABETES MELITUS, date of diagnosis:   2000   She has been on insulin since 2004 with difficulty controlling adequately because of compliance with diet and variability in blood sugars. Her previous records are not available at present, however she has been on basal insulin with mealtime coverage since at least 2007 She has been tried on Byetta previously but did not tolerate this well She tried Victoza but didn't not benefit from Victoza 0.6 dosage; had nausea from 1.2 mg; did not continue this because of this side effect She also was given a trial of the V-go pump but she subjectively did not like this  RECENT history:    INSULIN: Toujeo 22 units in morning, HUMALOG 6 units before most meals  Oral hypoglycemic drugs: Metformin ER 750 mg in pm and Synjardy  5/500, irreg   A1c is about the same at 7.8  She has the following blood sugar patterns and problems identified  She states that because of stress in December she has not been able to comply consistently with self-care measures including taking her Humalog at lunch  She may be doing better with taking it at lunchtime recently  However as before she has consistently HIGH readings in the late afternoon when she is checking them  Not clear what her blood sugars are midday but her lab glucose was within the target range after breakfast compared to fasting reading  Although she thinks she gets low blood sugars overnight with taking Synjardy she has only a couple of readings below 100  Also she is only taking half tablet sometimes late morning, does not otherwise have any side effects  No change in FASTING readings recently  Has regained some of the weight she had lost  She will sometimes do some exercise with her exercise bike at home     .         Monitors blood glucose:  about 2-3 times  daily     Glucometer: One Touch Verio.          Blood Glucose readings from download:    PRE-MEAL Fasting Lunch Dinner Bedtime Overall  Glucose range:  88-200  197, 241  73-344    Mean/median:  131   205   172   POST-MEAL PC Breakfast PC Lunch PC Dinner  Glucose range:    127-256  Mean/median:       Previously:  PRE-MEAL Fasting  midday  5-7 PM Bedtime Overall  Glucose range:  110-196  85-335  71-335  45-199   Mean/median:  141  229  209  132  176+/-74   POST-MEAL PC Breakfast PC Lunch PC Dinner  Glucose range:   117-242   Mean/median:   159  193   Blood sugars previously:  PRE-MEAL Fasting Lunch  5-7 PM Bedtime Overall  Glucose range:  115-192   134-230  113   Mean/median: 154   184  174   POST-MEAL PC Breakfast PC Lunch PC Dinner  Glucose range:    83-263  Mean/median:    184     Meals: 3 meals per day:  breakfast is At 7 am;  lunch 12-2 pm, dinner 5 pm Intake is quite variable at different meals, usually lighter supper    Wt Readings from Last 3 Encounters:  05/20/20 174 lb  3.2 oz (79 kg)  02/12/20 169 lb 9.6 oz (76.9 kg)  02/10/20 171 lb 12.8 oz (77.9 kg)    Lab Results  Component Value Date   HGBA1C 7.8 (H) 05/14/2020   HGBA1C 7.9 (H) 02/09/2020   HGBA1C 7.8 (H) 11/06/2019   Lab Results  Component Value Date   MICROALBUR 77.7 (H) 08/06/2019   LDLCALC 67 11/06/2019   CREATININE 1.00 05/14/2020    Lab on 05/14/2020  Component Date Value Ref Range Status  . Sodium 05/14/2020 137  135 - 145 mEq/L Final  . Potassium 05/14/2020 3.6  3.5 - 5.1 mEq/L Final  . Chloride 05/14/2020 101  96 - 112 mEq/L Final  . CO2 05/14/2020 29  19 - 32 mEq/L Final  . Glucose, Bld 05/14/2020 173* 70 - 99 mg/dL Final  . BUN 05/14/2020 25* 6 - 23 mg/dL Final  . Creatinine, Ser 05/14/2020 1.00  0.40 - 1.20 mg/dL Final  . Total Bilirubin 05/14/2020 0.5  0.2 - 1.2 mg/dL Final  . Alkaline Phosphatase 05/14/2020 41  39 - 117 U/L Final  . AST 05/14/2020 18  0 - 37 U/L Final  .  ALT 05/14/2020 10  0 - 35 U/L Final  . Total Protein 05/14/2020 7.6  6.0 - 8.3 g/dL Final  . Albumin 05/14/2020 4.0  3.5 - 5.2 g/dL Final  . GFR 05/14/2020 51.51* >60.00 mL/min Final   Calculated using the CKD-EPI Creatinine Equation (2021)  . Calcium 05/14/2020 9.5  8.4 - 10.5 mg/dL Final  . Hgb A1c MFr Bld 05/14/2020 7.8* 4.6 - 6.5 % Final   Glycemic Control Guidelines for People with Diabetes:Non Diabetic:  <6%Goal of Therapy: <7%Additional Action Suggested:  >8%      Allergies as of 05/20/2020   No Known Allergies     Medication List       Accurate as of May 20, 2020 10:53 AM. If you have any questions, ask your nurse or doctor.        Accu-Chek FastClix Lancets Misc Use Accu Chek Fastclix to check blood sugar 4 times daily.   aspirin EC 81 MG tablet Take 1 tablet (81 mg total) by mouth daily.   B-D UF III MINI PEN NEEDLES 31G X 5 MM Misc Generic drug: Insulin Pen Needle USE TO INJECT MEDICATION THREE TIMES DAILY   benazepril 20 MG tablet Commonly known as: LOTENSIN TAKE 1 TABLET(20 MG) BY MOUTH TWICE DAILY   calcium carbonate 500 MG chewable tablet Commonly known as: TUMS - dosed in mg elemental calcium Chew 1 tablet by mouth daily as needed for heartburn (indigestion).   carvedilol 12.5 MG tablet Commonly known as: COREG TAKE 1 TABLET BY MOUTH TWICE A DAY   cholecalciferol 1000 units tablet Commonly known as: VITAMIN D Take 1,000 Units by mouth daily.   Co Q-10 100 MG Caps Take 1 capsule by mouth 2 (two) times daily.   escitalopram 10 MG tablet Commonly known as: LEXAPRO Take 10 mg by mouth daily.   furosemide 20 MG tablet Commonly known as: LASIX Take 1 tablet (20 mg total) by mouth daily as needed for fluid or edema (or weight gain).   HumaLOG KwikPen 100 UNIT/ML KwikPen Generic drug: insulin lispro INJECT 6 TO 8 UNITS INTO THE SKIN TWICE DAILY   levothyroxine 75 MCG tablet Commonly known as: SYNTHROID TAKE 1 TABLET BY MOUTH EVERY DAY    memantine 5 MG tablet Commonly known as: NAMENDA Take 5 mg by mouth at bedtime.   metFORMIN 750  MG 24 hr tablet Commonly known as: GLUCOPHAGE-XR TAKE 1 TABLET BY MOUTH EVERY DAY   multivitamins ther. w/minerals Tabs tablet Take 1 tablet by mouth daily.   OneTouch Verio test strip Generic drug: glucose blood USE TO CHECK BLOOD SUGAR FOUR TIMES DAILY AS DIRECTED   rosuvastatin 20 MG tablet Commonly known as: CRESTOR TAKE 1 TABLET BY MOUTH EVERY DAY   Synjardy 5-500 MG Tabs Generic drug: Empagliflozin-metFORMIN HCl Take 1 tablet by mouth 2 (two) times daily.   Toujeo SoloStar 300 UNIT/ML Solostar Pen Generic drug: insulin glargine (1 Unit Dial) ADMINISTER 22 UNITS UNDER THE SKIN DAILY   vitamin B-12 100 MCG tablet Commonly known as: CYANOCOBALAMIN Take 100 mcg by mouth daily.       Allergies: No Known Allergies  Past Medical History:  Diagnosis Date  . Aortic valve disorders    S/P AVR 06/2012  . Arthritis   . Asthma    AS CHILD   . Depression   . Diabetes mellitus   . Heart murmur   . Hyperlipidemia   . Hypertension   . Hypothyroidism   . Ischemic heart disease    with prior CABG x 2 in 2014 along with AVR  . Lumbago   . Shortness of breath   . Thyroid disease     Past Surgical History:  Procedure Laterality Date  . AORTIC VALVE REPLACEMENT N/A 06/25/2012   Procedure: AORTIC VALVE REPLACEMENT (AVR);  Surgeon: Grace Isaac, MD;  Location: [facility];  Service: Open Heart Surgery;  Laterality: N/A;  . BREAST BIOPSY Right 08/2016   benign  . BREAST SURGERY     LT BREAST BX BENIGN  . CORONARY ARTERY BYPASS GRAFT N/A 06/25/2012   Procedure: Coronary artery bypass graft  times two using left internal mammary artery and right leg greater saphenous vein;  Surgeon: Grace Isaac, MD;  Location: [facility];  Service: Open Heart Surgery;  Laterality: N/A;  . INTRAOPERATIVE TRANSESOPHAGEAL ECHOCARDIOGRAM N/A 06/25/2012   Procedure: INTRAOPERATIVE TRANSESOPHAGEAL  ECHOCARDIOGRAM;  Surgeon: Grace Isaac, MD;  Location: [facility];  Service: Open Heart Surgery;  Laterality: N/A;  . TUBAL LIGATION      Family History  Problem Relation Age of Onset  . Heart disease Father     Social History:  reports that she has never smoked. She has never used smokeless tobacco. She reports that she does not drink alcohol and does not use drugs.  Review of Systems:   HYPERTENSION: Blood pressure is followed by PCP and cardiologist Treatment regimen includes benazepril and carvedilol   BP Readings from Last 3 Encounters:  05/20/20 126/76  02/12/20 130/78  02/10/20 (!) 142/82     HYPOTHYROIDISM: She has had long-standing hypothyroidism  She is regular with her levothyroxine 75 g dose She feels fairly good  TSH has been consistently in the normal range   Lab Results  Component Value Date   TSH 0.85 02/09/2020   TSH 1.57 11/06/2019   TSH 1.34 04/22/2019   FREET4 1.22 04/22/2019   FREET4 1.03 02/25/2019   FREET4 1.02 08/19/2018    HYPERLIPIDEMIA: The lipid abnormality consists of elevated LDL; has been  controlled with Crestor 20 mg LDL is below 70  Lab Results  Component Value Date   CHOL 124 11/06/2019   HDL 46.30 11/06/2019   LDLCALC 67 11/06/2019   LDLDIRECT 102.8 11/28/2013   TRIG 57.0 11/06/2019   CHOLHDL 3 11/06/2019  '    She does see a podiatrist regularly  for onychomycosis  Last foot exam was in 5/21 with podiatrist: No sensory loss on exam   Covid vaccine dates: 2/11  And 07/07/19   Examination:   BP 126/76   Pulse 67   Ht 5' (1.524 m)   Wt 174 lb 3.2 oz (79 kg)   SpO2 98%   BMI 34.02 kg/m   Body mass index is 34.02 kg/m.    ASSESSMENT/ PLAN:   Diabetes type 2, uncontrolled   See history of present illness for a discussion of the blood sugar patterns, recent management and problems identified  A1c is still high at 7.8  She is on basal bolus insulin and Synjardy/metformin  Blood sugars are mostly high  postprandial except after dinner and highest after lunch/before suppertime as discussed above even at home her blood sugars on average are the same as the last time despite reminders about adjusting her insulin and how to take her medications  She is still not taking Synjardy as directed regularly Her day-to-day management, blood sugar patterns, insulin adjustment, diet and medication was discussed in detail  Recommendations:  She will need to take her 5/500 Synjardy dose with breakfast daily regardless of her sugars  If she tends to have low sugars overnight she will reduce her Toujeo by another 2 units  In the meantime she can reduce her Toujeo down to 20 units  She was given the option of doing NPH insulin in the morning to help with her midday and afternoon blood sugars but she does not want to change as yet and is preferring to use an insulin pen also  She will consistently take at least 8 units of HUMALOG before lunch and if afternoon readings are still over 180 she will go up to 10 units  Stay with 6 units at breakfast of Humalog and will reduce the Humalog at suppertime based on her meal size  Regular exercise  Continue Metformin at dinnertime  Will also need to discuss freestyle libre on her next visit, she has been reluctant to do this  HYPERTENSION: Well controlled     Hypothyroidism: Controlled with 75 mcg and TSH will be checked again on the next visit     There are no Patient Instructions on file for this visit.   Total visit time 30 minutes    Elayne Snare 05/20/2020, 10:53 AM   Note: This office note was prepared with Dragon voice recognition system technology. Any transcriptional errors that result from this process are unintentional.

## 2020-05-20 NOTE — Patient Instructions (Signed)
Take 8 Humalog at lunch and if supper sugar is still over then go to 10 units  Toujeo 20 units  Synjardy 1 pill ever am

## 2020-06-01 NOTE — Progress Notes (Signed)
CARDIOLOGY OFFICE NOTE  Date:  06/15/2020    [name] Date of Birth: 10-11-1932 Medical Record #[id number]  PCP:  Rogers Blocker, MD  Cardiologist:  Servando Snare    Chief Complaint  Patient presents with  . Follow-up    History of Present Illness: [name] is a 87 y.o. female who presents today for a follow up visit. Seen for Dr. Acie Fredrickson. She has primarily followed with me for many years.    She has a history of DM, prior AVR and CABG x 2 per EBG back in 2014 - this was with a LIMA to the LAD and SVG to the LCX, with a pericardial Edwards tissue valve #21 mm. Did have post op atrial fib - was on amiodarone for a short time. Other issues include HLD, HTN, DM, depression and hypothyroidism.  Myoview ok from 06/2013. Last echo 12/2014.    I have followed her over the years. Now on some diuretic for her breathing. She tends to get depressed. Struggles with weight loss and has chronic fatigue. Worse with the pandemic. In September of 2020 she was noted to be on 2 beta blockers - I stopped the Bystolic - she improved with more energy and less depression on follow up in October - but had also had a family member pay for a roof that had brought her some financial relief. Cardiac status otherwise, ok. Tends to start and stop her antidepressant therapy. Worried about her memory. Watching TV most of the day. When seen last June - had gotten some assistance with her insulin so she was taking more regularly - A1C had gone up. Back off Lexapro. Overall felt to be stable from our standpoint. Last visit in October - had been placed on Namenda for her memory. Easily depressed.    Comes in today. Here alone. She feels pretty good. She is sad today that I am leaving. She is very grateful for what me and EBG have done for her over the years.  No chest pain. Breathing is ok. Weight is stable. Not dizzy. She got to go on trip up to [place] and see family - that made her very happy. She says her  BP medicines got changed from PCP - I am not able to figure that out - she is still on Coreg and ACE twice a day.   Past Medical History:  Diagnosis Date  . Aortic valve disorders    S/P AVR 06/2012  . Arthritis   . Asthma    AS CHILD   . Depression   . Diabetes mellitus   . Heart murmur   . Hyperlipidemia   . Hypertension   . Hypothyroidism   . Ischemic heart disease    with prior CABG x 2 in 2014 along with AVR  . Lumbago   . Shortness of breath   . Thyroid disease     Past Surgical History:  Procedure Laterality Date  . AORTIC VALVE REPLACEMENT N/A 06/25/2012   Procedure: AORTIC VALVE REPLACEMENT (AVR);  Surgeon: Grace Isaac, MD;  Location: [facility];  Service: Open Heart Surgery;  Laterality: N/A;  . BREAST BIOPSY Right 08/2016   benign  . BREAST SURGERY     LT BREAST BX BENIGN  . CORONARY ARTERY BYPASS GRAFT N/A 06/25/2012   Procedure: Coronary artery bypass graft  times two using left internal mammary artery and right leg greater saphenous vein;  Surgeon: Grace Isaac, MD;  Location: [facility];  Service: Open Heart Surgery;  Laterality: N/A;  . INTRAOPERATIVE TRANSESOPHAGEAL ECHOCARDIOGRAM N/A 06/25/2012   Procedure: INTRAOPERATIVE TRANSESOPHAGEAL ECHOCARDIOGRAM;  Surgeon: Grace Isaac, MD;  Location: [facility];  Service: Open Heart Surgery;  Laterality: N/A;  . TUBAL LIGATION       Medications: Current Meds  Medication Sig  . Accu-Chek FastClix Lancets MISC Use Accu Chek Fastclix to check blood sugar 4 times daily.  Marland Kitchen aspirin EC 81 MG tablet Take 1 tablet (81 mg total) by mouth daily.  . B-D UF III MINI PEN NEEDLES 31G X 5 MM MISC USE TO INJECT MEDICATION THREE TIMES DAILY  . benazepril (LOTENSIN) 20 MG tablet TAKE 1 TABLET(20 MG) BY MOUTH TWICE DAILY  . calcium carbonate (TUMS - DOSED IN MG ELEMENTAL CALCIUM) 500 MG chewable tablet Chew 1 tablet by mouth daily as needed for heartburn (indigestion).  . carvedilol (COREG) 12.5 MG tablet TAKE 1 TABLET BY MOUTH TWICE  A DAY  . cholecalciferol (VITAMIN D) 1000 UNITS tablet Take 1,000 Units by mouth daily.  . Coenzyme Q10 (CO Q-10) 100 MG CAPS Take 1 capsule by mouth 2 (two) times daily.  . Empagliflozin-metFORMIN HCl (SYNJARDY) 5-500 MG TABS Take 1 tablet by mouth daily.  Marland Kitchen escitalopram (LEXAPRO) 10 MG tablet Take 10 mg by mouth daily.  . furosemide (LASIX) 20 MG tablet Take 1 tablet (20 mg total) by mouth daily as needed for fluid or edema (or weight gain).  Marland Kitchen HUMALOG KWIKPEN 100 UNIT/ML KwikPen INJECT 6 TO 8 UNITS INTO THE SKIN TWICE DAILY  . insulin glargine, 1 Unit Dial, (TOUJEO SOLOSTAR) 300 UNIT/ML Solostar Pen Inject 20 Units into the skin daily.  Marland Kitchen levothyroxine (SYNTHROID) 75 MCG tablet TAKE 1 TABLET BY MOUTH EVERY DAY  . memantine (NAMENDA) 5 MG tablet Take 5 mg by mouth at bedtime.  . metFORMIN (GLUCOPHAGE-XR) 750 MG 24 hr tablet TAKE 1 TABLET BY MOUTH EVERY DAY  . Multiple Vitamins-Minerals (MULTIVITAMINS THER. W/MINERALS) TABS Take 1 tablet by mouth daily.  Glory Rosebush VERIO test strip USE TO CHECK BLOOD SUGAR FOUR TIMES DAILY AS DIRECTED  . rosuvastatin (CRESTOR) 20 MG tablet TAKE 1 TABLET BY MOUTH EVERY DAY  . vitamin B-12 (CYANOCOBALAMIN) 100 MCG tablet Take 100 mcg by mouth daily.     Allergies: No Known Allergies  Social History: The patient  reports that she has never smoked. She has never used smokeless tobacco. She reports that she does not drink alcohol and does not use drugs.   Family History: The patient's family history includes Heart disease in her father.   Review of Systems: Please see the history of present illness.   All other systems are reviewed and negative.   Physical Exam: VS:  BP (!) 142/72   Pulse 74   Ht 5' (1.524 m)   Wt 169 lb 6.4 oz (76.8 kg)   SpO2 97%   BMI 33.08 kg/m  .  BMI Body mass index is 33.08 kg/m.  Wt Readings from Last 3 Encounters:  06/15/20 169 lb 6.4 oz (76.8 kg)  05/20/20 174 lb 3.2 oz (79 kg)  02/12/20 169 lb 9.6 oz (76.9 kg)     General: Alert. She is in no acute distress. Weight is stable.   Cardiac: Regular rate and rhythm. Soft outflow murmur. No edema.  Respiratory:  Lungs are clear to auscultation bilaterally with normal work of breathing.  GI: Soft and nontender.  MS: No deformity or atrophy. Gait and ROM intact.  Skin: Warm and dry.  Color is normal.  Neuro:  Strength and sensation are intact and no gross focal deficits noted.  Psych: Alert, appropriate and with normal affect.   LABORATORY DATA:  EKG:  EKG is not ordered today.    Lab Results  Component Value Date   WBC 6.4 10/01/2019   HGB 10.4 (L) 10/01/2019   HCT 32.2 (L) 10/01/2019   PLT 202 10/01/2019   GLUCOSE 173 (H) 05/14/2020   CHOL 124 11/06/2019   TRIG 57.0 11/06/2019   HDL 46.30 11/06/2019   LDLDIRECT 102.8 11/28/2013   LDLCALC 67 11/06/2019   ALT 10 05/14/2020   AST 18 05/14/2020   NA 137 05/14/2020   K 3.6 05/14/2020   CL 101 05/14/2020   CREATININE 1.00 05/14/2020   BUN 25 (H) 05/14/2020   CO2 29 05/14/2020   TSH 0.85 02/09/2020   INR 1.57 (H) 06/25/2012   HGBA1C 7.8 (H) 05/14/2020   MICROALBUR 77.7 (H) 08/06/2019     BNP (last 3 results) No results for input(s): BNP in the last 8760 hours.  ProBNP (last 3 results) No results for input(s): PROBNP in the last 8760 hours.   Other Studies Reviewed Today:  Echo Study Conclusions 11/2016   - Left ventricle: The cavity size was normal. Systolic function was   normal. The estimated ejection fraction was in the range of 55%   to 60%. Wall motion was normal; there were no regional wall   motion abnormalities. There was an increased relative   contribution of atrial contraction to ventricular filling.   Doppler parameters are consistent with abnormal left ventricular   relaxation (grade 1 diastolic dysfunction). - Aortic valve: A pericardial bioprosthesis was present and   functioning normally. Mean gradient (S): 9 mm Hg. Peak gradient   (S): 17 mm Hg. - Mitral  valve: Calcified annulus. Mildly thickened leaflets .   There was mild to moderate regurgitation. - Left atrium: The atrium was mildly dilated. - Atrial septum: There was increased thickness of the septum,   consistent with lipomatous hypertrophy. - Pulmonary arteries: PA peak pressure: 42 mm Hg (S).   Impressions:   - The right ventricular systolic pressure was increased consistent   with moderate pulmonary hypertension.     Myoview Impression from 06/2013 Exercise Capacity:  Lexiscan with no exercise. BP Response:  Normal blood pressure response. Clinical Symptoms:  No significant symptoms noted. ECG Impression:  There are scattered PVCs. Comparison with Prior Nuclear Study: No previous nuclear study performed   Overall Impression:  Low risk stress nuclear study small fixed distal anterior artifact which could be breast attenuation or other artifact.   LV Ejection Fraction: 66%.  LV Wall Motion:  Normal Wall Motion   [name], MD, [facility]     ASSESSMENT & PLAN:     1. CAD - remote CABG/AVR from 2014 - stable Myoview from 2015 - last echo in 2018 - no worrisome symptoms.   2. Depression - probably the crux of her issues - this has been chronic - she has never taken her Lexapro consistently.   3. Memory issues - now on Namenda.   4. HTN - BP ok - I am not able to see what changes she has had - looks to me as if they are unchanged - she is adamant that she is not on Norvasc.   5. Chronic anemia - unchanged - followed by PCP.   6. DM - per PCP  7. HLD - on statin - lab on  return.   Current medicines are reviewed with the patient today.  The patient does not have concerns regarding medicines other than what has been noted above.  The following changes have been made:  See above.  Labs/ tests ordered today include:   No orders of the defined types were placed in this encounter.    Disposition:   FU with Dr. Johney Frame in about 3 months with fasting labs. We had a  good last visit today. She is felt to be doing ok from our standpoint.   Patient is agreeable to this plan and will call if any problems develop in the interim.   SignedTruitt Merle, NP  06/15/2020 10:07 AM  [place] [address] [place], [place]  [address] Phone: [phone] Fax: [phone]

## 2020-06-15 ENCOUNTER — Encounter: Payer: Self-pay | Admitting: Nurse Practitioner

## 2020-06-15 ENCOUNTER — Ambulatory Visit (INDEPENDENT_AMBULATORY_CARE_PROVIDER_SITE_OTHER): Payer: Medicare Other | Admitting: Nurse Practitioner

## 2020-06-15 ENCOUNTER — Other Ambulatory Visit: Payer: Self-pay

## 2020-06-15 VITALS — BP 142/72 | HR 74 | Ht 60.0 in | Wt 169.4 lb

## 2020-06-15 DIAGNOSIS — Z952 Presence of prosthetic heart valve: Secondary | ICD-10-CM | POA: Diagnosis not present

## 2020-06-15 DIAGNOSIS — I259 Chronic ischemic heart disease, unspecified: Secondary | ICD-10-CM | POA: Diagnosis not present

## 2020-06-15 DIAGNOSIS — I10 Essential (primary) hypertension: Secondary | ICD-10-CM | POA: Diagnosis not present

## 2020-06-15 DIAGNOSIS — Z951 Presence of aortocoronary bypass graft: Secondary | ICD-10-CM

## 2020-06-15 DIAGNOSIS — F32A Depression, unspecified: Secondary | ICD-10-CM

## 2020-06-15 NOTE — Patient Instructions (Addendum)
After Visit Summary:  We will be checking the following labs today -  NONE   Medication Instructions:    Continue with your current medicines.    If you need a refill on your cardiac medications before your next appointment, please call your pharmacy.     Testing/Procedures To Be Arranged:  N/A  Follow-Up:   See Dr. Johney Frame in about 3 months - with fasting labs.     At [facility], you and your health needs are our priority.  As part of our continuing mission to provide you with exceptional heart care, we have created designated Provider Care Teams.  These Care Teams include your primary Cardiologist (physician) and Advanced Practice Providers (APPs -  Physician Assistants and Nurse Practitioners) who all work together to provide you with the care you need, when you need it.  Special Instructions:  . Stay safe, wash your hands for at least 20 seconds and wear a mask when needed.  . It was good to talk with you today.    Call the [place] office at [phone] if you have any questions, problems or concerns.

## 2020-06-29 ENCOUNTER — Other Ambulatory Visit: Payer: Self-pay

## 2020-06-29 ENCOUNTER — Ambulatory Visit (INDEPENDENT_AMBULATORY_CARE_PROVIDER_SITE_OTHER): Payer: Medicare Other | Admitting: Podiatry

## 2020-06-29 ENCOUNTER — Encounter: Payer: Self-pay | Admitting: Podiatry

## 2020-06-29 DIAGNOSIS — B351 Tinea unguium: Secondary | ICD-10-CM | POA: Diagnosis not present

## 2020-06-29 DIAGNOSIS — L84 Corns and callosities: Secondary | ICD-10-CM | POA: Diagnosis not present

## 2020-06-29 DIAGNOSIS — M79676 Pain in unspecified toe(s): Secondary | ICD-10-CM

## 2020-06-29 DIAGNOSIS — E1151 Type 2 diabetes mellitus with diabetic peripheral angiopathy without gangrene: Secondary | ICD-10-CM | POA: Diagnosis not present

## 2020-06-29 DIAGNOSIS — M2041 Other hammer toe(s) (acquired), right foot: Secondary | ICD-10-CM

## 2020-06-29 NOTE — Progress Notes (Addendum)
This patient returns to my office for at risk foot care.  This patient requires this care by a professional since this patient will be at risk due to having  Type 2 diabetes.    This patient is unable to cut nails herself since the patient cannot reach her nails.These nails are painful walking and wearing shoes.  This patient presents for at risk foot care today.  General Appearance  Alert, conversant and in no acute stress.  Vascular  Dorsalis pedis  pulses are palpable  bilaterally. Posterior tibial pulses are absent  B/L.  Capillary return is within normal limits  bilaterally. Cold feet   Bilaterally. Absent digital hair.  Neurologic  Senn-Weinstein monofilament wire test within normal limits  bilaterally. Muscle power within normal limits bilaterally.  Nails Thick disfigured discolored nails with subungual debris  from hallux to fifth toes bilaterally. No evidence of bacterial infection or drainage bilaterally.  Orthopedic  No limitations of motion  feet .  No crepitus or effusions noted.  HAV  B/L.  Hammer toe second right.  Skin  normotropic skin with no porokeratosis noted bilaterally.  No signs of infections or ulcers noted.   HD second right symptomatic.  Onychomycosis  Pain in right toes  Pain in left toes  HD 2nd toe right  Consent was obtained for treatment procedures.   Mechanical debridement of nails 1-5  bilaterally performed with a nail nipper.  Filed with dremel without incident.    Return office visit  12 weeks                   Told patient to return for periodic foot care and evaluation due to potential at risk complications.   Gardiner Barefoot DPM

## 2020-06-30 ENCOUNTER — Other Ambulatory Visit: Payer: Self-pay | Admitting: Endocrinology

## 2020-07-20 ENCOUNTER — Other Ambulatory Visit: Payer: Medicare Other

## 2020-07-22 ENCOUNTER — Ambulatory Visit: Payer: Medicare Other | Admitting: Endocrinology

## 2020-08-12 ENCOUNTER — Other Ambulatory Visit (INDEPENDENT_AMBULATORY_CARE_PROVIDER_SITE_OTHER): Payer: Medicare Other

## 2020-08-12 ENCOUNTER — Other Ambulatory Visit: Payer: Self-pay

## 2020-08-12 DIAGNOSIS — E1165 Type 2 diabetes mellitus with hyperglycemia: Secondary | ICD-10-CM

## 2020-08-12 DIAGNOSIS — E063 Autoimmune thyroiditis: Secondary | ICD-10-CM

## 2020-08-12 DIAGNOSIS — Z794 Long term (current) use of insulin: Secondary | ICD-10-CM

## 2020-08-12 LAB — LIPID PANEL
Cholesterol: 101 mg/dL (ref 0–200)
HDL: 38.6 mg/dL — ABNORMAL LOW (ref >39.00)
LDL Cholesterol: 43 mg/dL (ref 0–99)
NonHDL: 62.5
Total CHOL/HDL Ratio: 3
Triglycerides: 98 mg/dL (ref 0.0–149.0)
VLDL: 19.6 mg/dL (ref 0.0–40.0)

## 2020-08-12 LAB — HEMOGLOBIN A1C: Hgb A1c MFr Bld: 8 % — ABNORMAL HIGH (ref 4.6–6.5)

## 2020-08-12 LAB — TSH: TSH: 1.45 u[IU]/mL (ref 0.35–4.50)

## 2020-08-12 LAB — COMPREHENSIVE METABOLIC PANEL
ALT: 10 U/L (ref 0–35)
AST: 16 U/L (ref 0–37)
Albumin: 3.9 g/dL (ref 3.5–5.2)
Alkaline Phosphatase: 41 U/L (ref 39–117)
BUN: 20 mg/dL (ref 6–23)
CO2: 28 mEq/L (ref 19–32)
Calcium: 10 mg/dL (ref 8.4–10.5)
Chloride: 102 mEq/L (ref 96–112)
Creatinine, Ser: 1.09 mg/dL (ref 0.40–1.20)
GFR: 46.36 mL/min — ABNORMAL LOW (ref >60.00)
Glucose, Bld: 142 mg/dL — ABNORMAL HIGH (ref 70–99)
Potassium: 3.7 mEq/L (ref 3.5–5.1)
Sodium: 138 mEq/L (ref 135–145)
Total Bilirubin: 0.4 mg/dL (ref 0.2–1.2)
Total Protein: 8 g/dL (ref 6.0–8.3)

## 2020-08-16 ENCOUNTER — Ambulatory Visit: Payer: Medicare Other | Admitting: Endocrinology

## 2020-08-17 ENCOUNTER — Other Ambulatory Visit: Payer: Self-pay

## 2020-08-17 ENCOUNTER — Encounter: Payer: Self-pay | Admitting: Endocrinology

## 2020-08-17 ENCOUNTER — Ambulatory Visit (INDEPENDENT_AMBULATORY_CARE_PROVIDER_SITE_OTHER): Payer: Medicare Other | Admitting: Endocrinology

## 2020-08-17 VITALS — BP 152/92 | HR 76 | Ht 60.0 in | Wt 164.0 lb

## 2020-08-17 DIAGNOSIS — R809 Proteinuria, unspecified: Secondary | ICD-10-CM

## 2020-08-17 DIAGNOSIS — E063 Autoimmune thyroiditis: Secondary | ICD-10-CM | POA: Diagnosis not present

## 2020-08-17 DIAGNOSIS — I259 Chronic ischemic heart disease, unspecified: Secondary | ICD-10-CM

## 2020-08-17 DIAGNOSIS — E1165 Type 2 diabetes mellitus with hyperglycemia: Secondary | ICD-10-CM

## 2020-08-17 DIAGNOSIS — E7800 Pure hypercholesterolemia, unspecified: Secondary | ICD-10-CM | POA: Diagnosis not present

## 2020-08-17 DIAGNOSIS — Z794 Long term (current) use of insulin: Secondary | ICD-10-CM

## 2020-08-17 DIAGNOSIS — E1129 Type 2 diabetes mellitus with other diabetic kidney complication: Secondary | ICD-10-CM

## 2020-08-17 NOTE — Progress Notes (Signed)
[name] 87 y.o.            Reason for Appointment: Endocrinology follow-up   History of Present Illness   Diagnosis: Type 2 DIABETES MELITUS, date of diagnosis:   2000   She has been on insulin since 2004 with difficulty controlling adequately because of compliance with diet and variability in blood sugars. Her previous records are not available at present, however she has been on basal insulin with mealtime coverage since at least 2007 She has been tried on Byetta previously but did not tolerate this well She tried Victoza but didn't not benefit from Victoza 0.6 dosage; had nausea from 1.2 mg; did not continue this because of this side effect She also was given a trial of the V-go pump but she subjectively did not like this  RECENT history:    INSULIN: Toujeo 20 units in morning, HUMALOG 6 units before meals  Oral hypoglycemic drugs: Metformin ER 750 mg in pm and Synjardy  5/500, irregular go tomorrow   A1c is about the same at.8  She has the following blood sugar patterns and problems identified  She usually has high readings before dinnertime or in the afternoon but now most of her hyperglycemia is appearing to be after evening meal  Highest blood sugar was 389 without any unusual meal yesterday  However she thinks she is eating more jellybeans  As before she has been checking blood sugars mostly before breakfast and around dinnertime and not clear how often blood sugars are high after lunch  Overall fasting blood sugars are relatively good with some variability  She has not had any hypoglycemia except low normal reading when she took 6 units to correct a high reading of 389 after dinner yesterday  Again reportedly taking her insulin consistently before meals but does not appear to be adjusting it much based on what she is eating  She will sometimes do some exercise with her stationary bike  She does not take her Synjardy consistently and also blames it on the  variability of her blood sugars, generally taking it at 11 AM but sometimes only half a tablet Has had some weight loss which she thinks may be from depression    .         Monitors blood glucose:  about 2-3 times daily     Glucometer: One Touch Verio.          Blood Glucose readings from download:    PRE-MEAL Fasting Lunch Dinner Bedtime Overall  Glucose range:  83-189      Mean/median: 135     183+/-69   POST-MEAL PC Breakfast PC Lunch PC Dinner  Glucose range:    141-389  Mean/median:   175  220   Previously  PRE-MEAL Fasting Lunch Dinner Bedtime Overall  Glucose range:  88-200  197, 241  73-344    Mean/median:  131   205   172   POST-MEAL PC Breakfast PC Lunch PC Dinner  Glucose range:    127-256  Mean/median:        Meals: 3 meals per day:  breakfast is At 7 am;  lunch 12-2 pm, dinner 5 pm Intake is quite variable at different meals, usually lighter supper    Wt Readings from Last 3 Encounters:  08/17/20 164 lb (74.4 kg)  06/15/20 169 lb 6.4 oz (76.8 kg)  05/20/20 174 lb 3.2 oz (79 kg)    Lab Results  Component Value Date   HGBA1C 8.0 (  H) 08/12/2020   HGBA1C 7.8 (H) 05/14/2020   HGBA1C 7.9 (H) 02/09/2020   Lab Results  Component Value Date   MICROALBUR 77.7 (H) 08/06/2019   LDLCALC 43 08/12/2020   CREATININE 1.09 08/12/2020    Lab on 08/12/2020  Component Date Value Ref Range Status  . TSH 08/12/2020 1.45  0.35 - 4.50 uIU/mL Final  . Cholesterol 08/12/2020 101  0 - 200 mg/dL Final   ATP III Classification       Desirable:  < 200 mg/dL               Borderline High:  200 - 239 mg/dL          High:  > = 240 mg/dL  . Triglycerides 08/12/2020 98.0  0.0 - 149.0 mg/dL Final   Normal:  <150 mg/dLBorderline High:  150 - 199 mg/dL  . HDL 08/12/2020 38.60* >39.00 mg/dL Final  . VLDL 08/12/2020 19.6  0.0 - 40.0 mg/dL Final  . LDL Cholesterol 08/12/2020 43  0 - 99 mg/dL Final  . Total CHOL/HDL Ratio 08/12/2020 3   Final                  Men          Women1/2  Average Risk     3.4          3.3Average Risk          5.0          4.42X Average Risk          9.6          7.13X Average Risk          15.0          11.0                      . NonHDL 08/12/2020 62.50   Final   NOTE:  Non-HDL goal should be 30 mg/dL higher than patient's LDL goal (i.e. LDL goal of < 70 mg/dL, would have non-HDL goal of < 100 mg/dL)  . Sodium 08/12/2020 138  135 - 145 mEq/L Final  . Potassium 08/12/2020 3.7  3.5 - 5.1 mEq/L Final  . Chloride 08/12/2020 102  96 - 112 mEq/L Final  . CO2 08/12/2020 28  19 - 32 mEq/L Final  . Glucose, Bld 08/12/2020 142* 70 - 99 mg/dL Final  . BUN 08/12/2020 20  6 - 23 mg/dL Final  . Creatinine, Ser 08/12/2020 1.09  0.40 - 1.20 mg/dL Final  . Total Bilirubin 08/12/2020 0.4  0.2 - 1.2 mg/dL Final  . Alkaline Phosphatase 08/12/2020 41  39 - 117 U/L Final  . AST 08/12/2020 16  0 - 37 U/L Final  . ALT 08/12/2020 10  0 - 35 U/L Final  . Total Protein 08/12/2020 8.0  6.0 - 8.3 g/dL Final  . Albumin 08/12/2020 3.9  3.5 - 5.2 g/dL Final  . GFR 08/12/2020 46.36* >60.00 mL/min Final   Calculated using the CKD-EPI Creatinine Equation (2021)  . Calcium 08/12/2020 10.0  8.4 - 10.5 mg/dL Final  . Hgb A1c MFr Bld 08/12/2020 8.0* 4.6 - 6.5 % Final   Glycemic Control Guidelines for People with Diabetes:Non Diabetic:  <6%Goal of Therapy: <7%Additional Action Suggested:  >8%      Allergies as of 08/17/2020   No Known Allergies     Medication List       Accurate as of August 17, 2020  9:34 AM.  If you have any questions, ask your nurse or doctor.        Accu-Chek FastClix Lancets Misc Use Accu Chek Fastclix to check blood sugar 4 times daily.   aspirin EC 81 MG tablet Take 1 tablet (81 mg total) by mouth daily.   B-D UF III MINI PEN NEEDLES 31G X 5 MM Misc Generic drug: Insulin Pen Needle USE TO INJECT MEDICATION THREE TIMES DAILY   benazepril 20 MG tablet Commonly known as: LOTENSIN TAKE 1 TABLET(20 MG) BY MOUTH TWICE DAILY   calcium  carbonate 500 MG chewable tablet Commonly known as: TUMS - dosed in mg elemental calcium Chew 1 tablet by mouth daily as needed for heartburn (indigestion).   carvedilol 12.5 MG tablet Commonly known as: COREG TAKE 1 TABLET BY MOUTH TWICE A DAY   cholecalciferol 1000 units tablet Commonly known as: VITAMIN D Take 1,000 Units by mouth daily.   Co Q-10 100 MG Caps Take 1 capsule by mouth 2 (two) times daily.   escitalopram 10 MG tablet Commonly known as: LEXAPRO Take 10 mg by mouth daily.   furosemide 20 MG tablet Commonly known as: LASIX Take 1 tablet (20 mg total) by mouth daily as needed for fluid or edema (or weight gain).   HumaLOG KwikPen 100 UNIT/ML KwikPen Generic drug: insulin lispro INJECT 6 TO 8 UNITS INTO THE SKIN TWICE DAILY   levothyroxine 75 MCG tablet Commonly known as: SYNTHROID TAKE 1 TABLET BY MOUTH EVERY DAY   memantine 5 MG tablet Commonly known as: NAMENDA Take 5 mg by mouth at bedtime.   metFORMIN 750 MG 24 hr tablet Commonly known as: GLUCOPHAGE-XR TAKE 1 TABLET BY MOUTH EVERY DAY   multivitamins ther. w/minerals Tabs tablet Take 1 tablet by mouth daily.   OneTouch Verio test strip Generic drug: glucose blood USE TO CHECK BLOOD SUGAR FOUR TIMES DAILY AS DIRECTED   rosuvastatin 20 MG tablet Commonly known as: CRESTOR TAKE 1 TABLET BY MOUTH EVERY DAY   Synjardy 5-500 MG Tabs Generic drug: Empagliflozin-metFORMIN HCl Take 1 tablet by mouth daily.   Toujeo SoloStar 300 UNIT/ML Solostar Pen Generic drug: insulin glargine (1 Unit Dial) Inject 20 Units into the skin daily.   vitamin B-12 100 MCG tablet Commonly known as: CYANOCOBALAMIN Take 100 mcg by mouth daily.       Allergies: No Known Allergies  Past Medical History:  Diagnosis Date  . Aortic valve disorders    S/P AVR 06/2012  . Arthritis   . Asthma    AS CHILD   . Depression   . Diabetes mellitus   . Heart murmur   . Hyperlipidemia   . Hypertension   . Hypothyroidism    . Ischemic heart disease    with prior CABG x 2 in 2014 along with AVR  . Lumbago   . Shortness of breath   . Thyroid disease     Past Surgical History:  Procedure Laterality Date  . AORTIC VALVE REPLACEMENT N/A 06/25/2012   Procedure: AORTIC VALVE REPLACEMENT (AVR);  Surgeon: Grace Isaac, MD;  Location: [place];  Service: Open Heart Surgery;  Laterality: N/A;  . BREAST BIOPSY Right 08/2016   benign  . BREAST SURGERY     LT BREAST BX BENIGN  . CORONARY ARTERY BYPASS GRAFT N/A 06/25/2012   Procedure: Coronary artery bypass graft  times two using left internal mammary artery and right leg greater saphenous vein;  Surgeon: Grace Isaac, MD;  Location: [facility];  Service:  Open Heart Surgery;  Laterality: N/A;  . INTRAOPERATIVE TRANSESOPHAGEAL ECHOCARDIOGRAM N/A 06/25/2012   Procedure: INTRAOPERATIVE TRANSESOPHAGEAL ECHOCARDIOGRAM;  Surgeon: Grace Isaac, MD;  Location: [facility];  Service: Open Heart Surgery;  Laterality: N/A;  . TUBAL LIGATION      Family History  Problem Relation Age of Onset  . Heart disease Father     Social History:  reports that she has never smoked. She has never used smokeless tobacco. She reports that she does not drink alcohol and does not use drugs.  Review of Systems:   HYPERTENSION: Blood pressure is followed by PCP and cardiologist Treatment regimen includes benazepril and carvedilol  She has had more issues with anxiety and depression recently and blood pressure is higher  BP Readings from Last 3 Encounters:  08/17/20 (!) 152/92  06/15/20 (!) 142/72  05/20/20 126/76     HYPOTHYROIDISM: She has had long-standing hypothyroidism  She is regular with her levothyroxine 75 g dose  TSH has been consistently in the normal range   Lab Results  Component Value Date   TSH 1.45 08/12/2020   TSH 0.85 02/09/2020   TSH 1.57 11/06/2019   FREET4 1.22 04/22/2019   FREET4 1.03 02/25/2019   FREET4 1.02 08/19/2018    HYPERLIPIDEMIA: The lipid  abnormality consists of elevated LDL; has been  controlled with Crestor 20 mg LDL is below 70  Lab Results  Component Value Date   CHOL 101 08/12/2020   HDL 38.60 (L) 08/12/2020   LDLCALC 43 08/12/2020   LDLDIRECT 102.8 11/28/2013   TRIG 98.0 08/12/2020   CHOLHDL 3 08/12/2020  '    She does see a podiatrist regularly for onychomycosis  Last foot exam was in 5/21 with podiatrist: No sensory loss on exam   Covid vaccine dates: 2/11  And 07/07/19   Examination:   BP (!) 152/92   Pulse 76   Ht 5' (1.524 m)   Wt 164 lb (74.4 kg)   SpO2 99%   BMI 32.03 kg/m   Body mass index is 32.03 kg/m.    ASSESSMENT/ PLAN:   Diabetes type 2, uncontrolled   See history of present illness for a discussion of the blood sugar patterns, recent management and problems identified  A1c is still high at 8%, previously 7.8  She is on basal bolus insulin and Synjardy/metformin  Blood sugars are difficult to control with her variable diet, timing of her insulin as well as not adjusting insulin based on what she is eating Also recently blood sugars are unusually higher after about 6 PM probably from her getting more sweets Fasting readings are generally fairly good Again not taking Synjardy consistently and discussed the benefits and timing of taking this Discussed blood sugar targets at various meals   Recommendations:  She will need to take her 5/500 Synjardy dose with breakfast daily consistently and reassured her that it is not causing side effects  Continue 20 units of Toujeo  Discussed in detail the use of the freestyle libre sensors and how it would be useful but she is still reluctant to consider it now, patient information given for her to review  Likely needs up to 8 units of Humalog at lunch or dinner if eating larger carbohydrate meals  She will not take more than 4 units for correction doses if blood sugars are over 300  She can also start walking for exercise  HYPERTENSION:  Blood pressure is unusually high, likely from anxiety She is going to follow-up with  her PCP and cardiologist soon  History of microalbuminuria: We will need follow-up on the next visit  LIPIDS: Excellent control with LDL 43  Hypothyroidism: Controlled with 75 mcg consistently     There are no Patient Instructions on file for this visit.      Elayne Snare 08/17/2020, 9:34 AM   Note: This office note was prepared with Dragon voice recognition system technology. Any transcriptional errors that result from this process are unintentional.

## 2020-08-17 NOTE — Patient Instructions (Signed)
Take Synjardy at breakfast daily

## 2020-09-01 ENCOUNTER — Other Ambulatory Visit: Payer: Self-pay | Admitting: Endocrinology

## 2020-09-05 NOTE — Progress Notes (Signed)
Cardiology Office Note:    Date:  09/08/2020   ID:  Josefina Do, DOB 02/11/1933, MRN [id number]  PCP:  Rogers Blocker, MD   [facility] Providers Cardiologist:  None {  Referring MD: Rogers Blocker, MD    History of Present Illness:    [name] is a 87 y.o. female with a hx of prior AVR and CABG x 2 per Dr. Servando Snare back in 2014  (LIMA to the LAD and SVG to the LCX,with a pericardial Edwards tissue valve #21 mm), post op atrial fib, HLD, HTN, DM, depression and hypothyroidism who was previously followed by Truitt Merle who now returns to clinic for follow-up.  Last seen in clinic on 06/15/20 where she was doing well from a cardiac standpoint.   Today, the patient states that she feels overall well. Suffers from occasional depression as she is lonely at home and feels like she is unable to get out of the house much. She has not seen her family since Christmas. Otherwise, no chest pain, SOB, orthopnea or LE edema. Feels a little off-balance with walking. Not interested in PT at this time. Tolerating all medications as prescribed. Blood pressures well controlled at home.  Past Medical History:  Diagnosis Date  . Aortic valve disorders    S/P AVR 06/2012  . Arthritis   . Asthma    AS CHILD   . Depression   . Diabetes mellitus   . Heart murmur   . Hyperlipidemia   . Hypertension   . Hypothyroidism   . Ischemic heart disease    with prior CABG x 2 in 2014 along with AVR  . Lumbago   . Shortness of breath   . Thyroid disease     Past Surgical History:  Procedure Laterality Date  . AORTIC VALVE REPLACEMENT N/A 06/25/2012   Procedure: AORTIC VALVE REPLACEMENT (AVR);  Surgeon: Grace Isaac, MD;  Location: [place];  Service: Open Heart Surgery;  Laterality: N/A;  . BREAST BIOPSY Right 08/2016   benign  . BREAST SURGERY     LT BREAST BX BENIGN  . CORONARY ARTERY BYPASS GRAFT N/A 06/25/2012   Procedure: Coronary artery bypass graft  times two using left internal  mammary artery and right leg greater saphenous vein;  Surgeon: Grace Isaac, MD;  Location: [place];  Service: Open Heart Surgery;  Laterality: N/A;  . INTRAOPERATIVE TRANSESOPHAGEAL ECHOCARDIOGRAM N/A 06/25/2012   Procedure: INTRAOPERATIVE TRANSESOPHAGEAL ECHOCARDIOGRAM;  Surgeon: Grace Isaac, MD;  Location: [place];  Service: Open Heart Surgery;  Laterality: N/A;  . TUBAL LIGATION      Current Medications: Current Meds  Medication Sig  . Accu-Chek FastClix Lancets MISC Use Accu Chek Fastclix to check blood sugar 4 times daily.  Marland Kitchen aspirin EC 81 MG tablet Take 1 tablet (81 mg total) by mouth daily.  . B-D UF III MINI PEN NEEDLES 31G X 5 MM MISC USE TO INJECT MEDICATION THREE TIMES DAILY  . benazepril (LOTENSIN) 20 MG tablet TAKE 1 TABLET(20 MG) BY MOUTH TWICE DAILY  . calcium carbonate (TUMS - DOSED IN MG ELEMENTAL CALCIUM) 500 MG chewable tablet Chew 1 tablet by mouth daily as needed for heartburn (indigestion).  . carvedilol (COREG) 12.5 MG tablet TAKE 1 TABLET BY MOUTH TWICE A DAY  . cholecalciferol (VITAMIN D) 1000 UNITS tablet Take 1,000 Units by mouth daily.  . Coenzyme Q10 (CO Q-10) 100 MG CAPS Take 1 capsule by mouth 2 (two) times daily.  Marland Kitchen  Empagliflozin-metFORMIN HCl (SYNJARDY) 5-500 MG TABS Take 1 tablet by mouth. Takes occassionally  . furosemide (LASIX) 20 MG tablet Take 1 tablet (20 mg total) by mouth daily as needed for fluid or edema (or weight gain).  Marland Kitchen HUMALOG KWIKPEN 100 UNIT/ML KwikPen INJECT 6 TO 8 UNITS INTO THE SKIN TWICE DAILY  . insulin glargine, 1 Unit Dial, (TOUJEO SOLOSTAR) 300 UNIT/ML Solostar Pen Inject 20 Units into the skin daily.  Marland Kitchen levothyroxine (SYNTHROID) 75 MCG tablet TAKE 1 TABLET BY MOUTH EVERY DAY  . memantine (NAMENDA) 5 MG tablet Take 5 mg by mouth at bedtime.  . metFORMIN (GLUCOPHAGE-XR) 750 MG 24 hr tablet TAKE 1 TABLET BY MOUTH EVERY DAY  . Multiple Vitamins-Minerals (MULTIVITAMINS THER. W/MINERALS) TABS Take 1 tablet by mouth daily.  Glory Rosebush VERIO test strip USE TO CHECK BLOOD SUGAR FOUR TIMES DAILY AS DIRECTED  . rosuvastatin (CRESTOR) 20 MG tablet TAKE 1 TABLET BY MOUTH EVERY DAY  . vitamin B-12 (CYANOCOBALAMIN) 100 MCG tablet Take 100 mcg by mouth daily.     Allergies:   Patient has no known allergies.   Social History   Socioeconomic History  . Marital status: Widowed    Spouse name: Not on file  . Number of children: Not on file  . Years of education: Not on file  . Highest education level: Not on file  Occupational History  . Not on file  Tobacco Use  . Smoking status: Never Smoker  . Smokeless tobacco: Never Used  Vaping Use  . Vaping Use: Never used  Substance and Sexual Activity  . Alcohol use: No  . Drug use: No  . Sexual activity: Not Currently  Other Topics Concern  . Not on file  Social History Narrative  . Not on file   Social Determinants of Health   Financial Resource Strain: Not on file  Food Insecurity: Not on file  Transportation Needs: Not on file  Physical Activity: Not on file  Stress: Not on file  Social Connections: Not on file     Family History: The patient's family history includes Heart disease in her father.  ROS:   Please see the history of present illness.    Review of Systems  Constitutional: Negative for chills and fever.  HENT: Negative for hearing loss.   Eyes: Negative for blurred vision and redness.  Respiratory: Negative for shortness of breath.   Cardiovascular: Negative for chest pain, palpitations, orthopnea, claudication, leg swelling and PND.  Gastrointestinal: Negative for melena, nausea and vomiting.  Genitourinary: Negative for flank pain.  Musculoskeletal: Positive for falls and joint pain.  Neurological: Negative for dizziness and loss of consciousness.  Endo/Heme/Allergies: Negative for polydipsia.  Psychiatric/Behavioral: Negative for substance abuse.    EKGs/Labs/Other Studies Reviewed:    The following studies were reviewed  today: EchoStudy Conclusions8/2018  - Left ventricle: The cavity size was normal. Systolic function was normal. The estimated ejection fraction was in the range of 55% to 60%. Wall motion was normal; there were no regional wall motion abnormalities. There was an increased relative contribution of atrial contraction to ventricular filling. Doppler parameters are consistent with abnormal left ventricular relaxation (grade 1 diastolic dysfunction). - Aortic valve: A pericardial bioprosthesis was present and functioning normally. Mean gradient (S): 9 mm Hg. Peak gradient (S): 17 mm Hg. - Mitral valve: Calcified annulus. Mildly thickened leaflets . There was mild to moderate regurgitation. - Left atrium: The atrium was mildly dilated. - Atrial septum: There was increased thickness of  the septum, consistent with lipomatous hypertrophy. - Pulmonary arteries: PA peak pressure: 42 mm Hg (S).  Impressions:  - The right ventricular systolic pressure was increased consistent with moderate pulmonary hypertension.  Myoview Impression from 06/2013 Exercise Capacity:Lexiscan with no exercise. BP Response:Normal blood pressure response. Clinical Symptoms:No significant symptoms noted. ECG Impression:There are scattered PVCs. Comparison with Prior Nuclear Study: No previous nuclear study performed  Overall Impression:Low risk stress nuclear study small fixed distal anterior artifact which could be breast attenuation or other artifact.  LV Ejection Fraction: 66%.LV Wall Motion:Normal Wall Motion  EKG:  EKG is  ordered today.  The ekg ordered today demonstrates NSR, anterior q-waves, HR 61  Recent Labs: 10/01/2019: Hemoglobin 10.4; Platelets 202 08/12/2020: ALT 10; BUN 20; Creatinine, Ser 1.09; Potassium 3.7; Sodium 138; TSH 1.45  Recent Lipid Panel    Component Value Date/Time   CHOL 101 08/12/2020 1010   CHOL 148 10/01/2019 1211   TRIG 98.0  08/12/2020 1010   HDL 38.60 (L) 08/12/2020 1010   HDL 54 10/01/2019 1211   CHOLHDL 3 08/12/2020 1010   VLDL 19.6 08/12/2020 1010   LDLCALC 43 08/12/2020 1010   LDLCALC 79 10/01/2019 1211   LDLDIRECT 102.8 11/28/2013 0827     Risk Assessment/Calculations:       Physical Exam:    VS:  BP 134/82   Pulse 61   Ht 5' (1.524 m)   Wt 161 lb (73 kg)   SpO2 97%   BMI 31.44 kg/m     Wt Readings from Last 3 Encounters:  09/08/20 161 lb (73 kg)  08/17/20 164 lb (74.4 kg)  06/15/20 169 lb 6.4 oz (76.8 kg)     GEN:  Well nourished, well developed in no acute distress, elderly female HEENT: Normal NECK: No JVD; No carotid bruits CARDIAC: RRR, soft systolic murmur. No rubs, gallops RESPIRATORY:  Clear to auscultation without rales, wheezing or rhonchi  ABDOMEN: Soft, non-tender, non-distended MUSCULOSKELETAL:  Trace right ankle edema. Warm SKIN: Warm and dry NEUROLOGIC:  Alert and oriented x 3 PSYCHIATRIC:  Normal affect   ASSESSMENT:    1. S/P AVR   2. Essential hypertension   3. S/P CABG (coronary artery bypass graft)   4. Other hyperlipidemia   5. Ischemic heart disease   6. Coronary artery disease involving native coronary artery of native heart without angina pectoris    PLAN:    In order of problems listed above:  #CAD with remote history of CABG/AVR in 2014: Doing well with no anginal symptoms. Myoview 2015 without ischemia. -Check TTE for serial monitoring of AVR -Continue ASA 81mg  daily -Continue benzapril 20mg  daily -Continue coreg 12.5mg  BID -Continue lasix 20mg  daily -Continue crestor 20mg  daily  #HTN: -Continue benzapril 20mg  daily -Continue coreg 12.5mg  BID -Continue lasix 20mg  daily  #DMII: -Management per PCP  #HLD: Well controlled with LDL 43 in 07/2020. -Continue crestor 20mg  daily    Medication Adjustments/Labs and Tests Ordered: Current medicines are reviewed at length with the patient today.  Concerns regarding medicines are outlined  above.  Orders Placed This Encounter  Procedures  . EKG 12-Lead  . ECHOCARDIOGRAM COMPLETE   No orders of the defined types were placed in this encounter.   Patient Instructions  Medication Instructions:   Your physician recommends that you continue on your current medications as directed. Please refer to the Current Medication list given to you today.  *If you need a refill on your cardiac medications before your next appointment, please call your pharmacy*  Testing/Procedures:  Your physician has requested that you have an echocardiogram. Echocardiography is a painless test that uses sound waves to create images of your heart. It provides your doctor with information about the size and shape of your heart and how well your heart's chambers and valves are working. This procedure takes approximately one hour. There are no restrictions for this procedure.   Follow-Up:  3 MONTHS IN THE OFFICE WITH DR. Remi Haggard YOU MAY USE ANY OPEN SLOT PER DR. Johney Frame      Signed, Freada Bergeron, MD  09/08/2020 1:03 PM    [facility]

## 2020-09-08 ENCOUNTER — Ambulatory Visit (INDEPENDENT_AMBULATORY_CARE_PROVIDER_SITE_OTHER): Payer: Medicare Other | Admitting: Cardiology

## 2020-09-08 ENCOUNTER — Other Ambulatory Visit: Payer: Self-pay

## 2020-09-08 ENCOUNTER — Encounter: Payer: Self-pay | Admitting: Cardiology

## 2020-09-08 VITALS — BP 134/82 | HR 61 | Ht 60.0 in | Wt 161.0 lb

## 2020-09-08 DIAGNOSIS — I10 Essential (primary) hypertension: Secondary | ICD-10-CM | POA: Diagnosis not present

## 2020-09-08 DIAGNOSIS — Z951 Presence of aortocoronary bypass graft: Secondary | ICD-10-CM | POA: Diagnosis not present

## 2020-09-08 DIAGNOSIS — Z952 Presence of prosthetic heart valve: Secondary | ICD-10-CM | POA: Diagnosis not present

## 2020-09-08 DIAGNOSIS — E7849 Other hyperlipidemia: Secondary | ICD-10-CM

## 2020-09-08 DIAGNOSIS — I259 Chronic ischemic heart disease, unspecified: Secondary | ICD-10-CM

## 2020-09-08 DIAGNOSIS — I2510 Atherosclerotic heart disease of native coronary artery without angina pectoris: Secondary | ICD-10-CM

## 2020-09-08 NOTE — Patient Instructions (Signed)
Medication Instructions:   Your physician recommends that you continue on your current medications as directed. Please refer to the Current Medication list given to you today.  *If you need a refill on your cardiac medications before your next appointment, please call your pharmacy*   Testing/Procedures:  Your physician has requested that you have an echocardiogram. Echocardiography is a painless test that uses sound waves to create images of your heart. It provides your doctor with information about the size and shape of your heart and how well your heart's chambers and valves are working. This procedure takes approximately one hour. There are no restrictions for this procedure.   Follow-Up:  3 MONTHS IN THE OFFICE WITH DR. PEMBERTON--SCHEDULING YOU MAY USE ANY OPEN SLOT PER DR. Johney Frame

## 2020-09-23 ENCOUNTER — Other Ambulatory Visit: Payer: Self-pay | Admitting: Internal Medicine

## 2020-09-23 ENCOUNTER — Ambulatory Visit
Admission: RE | Admit: 2020-09-23 | Discharge: 2020-09-23 | Disposition: A | Discharge status: Home / Self Care | Payer: Medicare Other | Source: Ambulatory Visit | Attending: Internal Medicine | Admitting: Internal Medicine

## 2020-09-23 DIAGNOSIS — R059 Cough, unspecified: Secondary | ICD-10-CM

## 2020-10-01 ENCOUNTER — Ambulatory Visit (INDEPENDENT_AMBULATORY_CARE_PROVIDER_SITE_OTHER): Payer: Medicare Other | Admitting: Podiatry

## 2020-10-01 ENCOUNTER — Encounter: Payer: Self-pay | Admitting: Podiatry

## 2020-10-01 ENCOUNTER — Other Ambulatory Visit: Payer: Self-pay

## 2020-10-01 DIAGNOSIS — L84 Corns and callosities: Secondary | ICD-10-CM

## 2020-10-01 DIAGNOSIS — B351 Tinea unguium: Secondary | ICD-10-CM | POA: Diagnosis not present

## 2020-10-01 DIAGNOSIS — M2041 Other hammer toe(s) (acquired), right foot: Secondary | ICD-10-CM

## 2020-10-01 DIAGNOSIS — E1151 Type 2 diabetes mellitus with diabetic peripheral angiopathy without gangrene: Secondary | ICD-10-CM | POA: Diagnosis not present

## 2020-10-01 DIAGNOSIS — M79676 Pain in unspecified toe(s): Secondary | ICD-10-CM

## 2020-10-01 NOTE — Progress Notes (Signed)
This patient returns to my office for at risk foot care.  This patient requires this care by a professional since this patient will be at risk due to having  Type 2 diabetes.    This patient is unable to cut nails herself since the patient cannot reach her nails.These nails are painful walking and wearing shoes.  This patient presents for at risk foot care today.  General Appearance  Alert, conversant and in no acute stress.  Vascular  Dorsalis pedis  pulses are palpable  bilaterally. Posterior tibial pulses are absent  B/L.  Capillary return is within normal limits  bilaterally. Cold feet   Bilaterally. Absent digital hair.  Neurologic  Senn-Weinstein monofilament wire test within normal limits  bilaterally. Muscle power within normal limits bilaterally.  Nails Thick disfigured discolored nails with subungual debris  from hallux to fifth toes bilaterally. No evidence of bacterial infection or drainage bilaterally.  Orthopedic  No limitations of motion  feet .  No crepitus or effusions noted.  HAV  B/L.  Hammer toe second right.  Skin  normotropic skin with no porokeratosis noted bilaterally.  No signs of infections or ulcers noted.   HD second right symptomatic.  Onychomycosis  Pain in right toes  Pain in left toes  HD 2nd toe right  Consent was obtained for treatment procedures.   Mechanical debridement of nails 1-5  bilaterally performed with a nail nipper.  Filed with dremel without incident.    Return office visit  12 weeks                   Told patient to return for periodic foot care and evaluation due to potential at risk complications.   Gardiner Barefoot DPM

## 2020-10-07 ENCOUNTER — Other Ambulatory Visit: Payer: Self-pay

## 2020-10-07 ENCOUNTER — Ambulatory Visit (HOSPITAL_COMMUNITY): Payer: Medicare Other | Attending: Cardiology

## 2020-10-07 DIAGNOSIS — Z952 Presence of prosthetic heart valve: Secondary | ICD-10-CM | POA: Diagnosis present

## 2020-10-07 LAB — ECHOCARDIOGRAM COMPLETE
AV Mean grad: 9.3 mmHg
AV Peak grad: 14.7 mmHg
Ao pk vel: 1.92 m/s
Area-P 1/2: 1.87 cm2
S' Lateral: 2.5 cm

## 2020-10-07 MED ORDER — CARVEDILOL 12.5 MG PO TABS: 12.5000 mg | ORAL_TABLET | Freq: Two times a day (BID) | ORAL | 3 refills | Status: AC

## 2020-10-11 ENCOUNTER — Other Ambulatory Visit: Payer: Self-pay | Admitting: Endocrinology

## 2020-10-18 ENCOUNTER — Other Ambulatory Visit: Payer: Medicare Other

## 2020-10-21 ENCOUNTER — Ambulatory Visit: Payer: Medicare Other | Admitting: Endocrinology

## 2020-10-21 ENCOUNTER — Other Ambulatory Visit: Payer: Self-pay | Admitting: Endocrinology

## 2020-11-08 ENCOUNTER — Other Ambulatory Visit (INDEPENDENT_AMBULATORY_CARE_PROVIDER_SITE_OTHER): Payer: Medicare Other

## 2020-11-08 ENCOUNTER — Other Ambulatory Visit: Payer: Self-pay

## 2020-11-08 DIAGNOSIS — Z794 Long term (current) use of insulin: Secondary | ICD-10-CM

## 2020-11-08 DIAGNOSIS — E063 Autoimmune thyroiditis: Secondary | ICD-10-CM | POA: Diagnosis not present

## 2020-11-08 DIAGNOSIS — E1165 Type 2 diabetes mellitus with hyperglycemia: Secondary | ICD-10-CM

## 2020-11-08 LAB — HEMOGLOBIN A1C: Hgb A1c MFr Bld: 8.9 % — ABNORMAL HIGH (ref 4.6–6.5)

## 2020-11-08 LAB — BASIC METABOLIC PANEL
BUN: 22 mg/dL (ref 6–23)
CO2: 25 mEq/L (ref 19–32)
Calcium: 9.9 mg/dL (ref 8.4–10.5)
Chloride: 102 mEq/L (ref 96–112)
Creatinine, Ser: 1.42 mg/dL — ABNORMAL HIGH (ref 0.40–1.20)
GFR: 33.7 mL/min — ABNORMAL LOW (ref >60.00)
Glucose, Bld: 218 mg/dL — ABNORMAL HIGH (ref 70–99)
Potassium: 3.5 mEq/L (ref 3.5–5.1)
Sodium: 138 mEq/L (ref 135–145)

## 2020-11-08 LAB — TSH: TSH: 1.01 u[IU]/mL (ref 0.35–5.50)

## 2020-11-09 NOTE — Progress Notes (Signed)
[name] 87 y.o.            Reason for Appointment: Endocrinology follow-up   History of Present Illness   Diagnosis: Type 2 DIABETES MELITUS, date of diagnosis:   2000   She has been on insulin since 2004 with difficulty controlling adequately because of compliance with diet and variability in blood sugars. Her previous records are not available at present, however she has been on basal insulin with mealtime coverage since at least 2007 She has been tried on Byetta previously but did not tolerate this well She tried Victoza but didn't not benefit from Victoza 0.6 dosage; had nausea from 1.2 mg; did not continue this because of this side effect She also was given a trial of the V-go pump but she subjectively did not like this  RECENT history:    INSULIN: Toujeo 20 units in morning, HUMALOG 6 units before meals  Oral hypoglycemic drugs: Metformin ER 750 mg in pm, she was on Synjardy  5/500    A1c is higher at 8.9 compared to 8  She has the following blood sugar patterns and problems identified She apparently has had some intercurrent problems such as UTIs and her blood sugars have been higher She does not think she has changed her insulin doses or missed her mealtime Humalog She was previously taking Synjardy although not regularly but now she said that she has not had this for a while Taking metformin Before she has been checking blood sugars mostly before breakfast and around dinnertime and not clear how often blood sugars are high after lunch Overall fasting blood sugars are much higher than before when they were averaging 135 POSTPRANDIAL readings appear to be progressively higher from morning till evening Blood sugars are also higher after breakfast Blood sugars after 5 PM are consistently over 180 She did not call to report high readings Has had some weight loss again She thinks her appetite has been somewhat less    .         Monitors blood glucose:  about 2-3  times daily     Glucometer: One Touch Verio.          Blood Glucose readings from download:    PRE-MEAL Fasting Midday Dinner Bedtime Overall  Glucose range: 159-233    129-438  Mean/median: 182 238 ?  233+/-72   POST-MEAL PC Breakfast PC Lunch PC Dinner  Glucose range:     Mean/median:  307 304   Prior  PRE-MEAL Fasting Lun  ch Dinner Bedtime Overall  Glucose range:  83-189      Mean/median: 135     183+/-69   POST-MEAL PC Breakfast PC Lunch PC Dinner  Glucose range:    141-389  Mean/median:   175  220      Meals: 3 meals per day:  breakfast is At 7 am;  lunch 12-2 pm, dinner 5 pm Intake is quite variable at different meals, usually lighter supper    Wt Readings from Last 3 Encounters:  11/10/20 157 lb 9.6 oz (71.5 kg)  09/08/20 161 lb (73 kg)  08/17/20 164 lb (74.4 kg)    Lab Results  Component Value Date   HGBA1C 8.9 (H) 11/08/2020   HGBA1C 8.0 (H) 08/12/2020   HGBA1C 7.8 (H) 05/14/2020   Lab Results  Component Value Date   MICROALBUR 77.7 (H) 08/06/2019   LDLCALC 43 08/12/2020   CREATININE 1.42 (H) 11/08/2020    Lab on 11/08/2020  Component Date Value  Ref Range Status   TSH 11/08/2020 1.01  0.35 - 5.50 uIU/mL Final   Sodium 11/08/2020 138  135 - 145 mEq/L Final   Potassium 11/08/2020 3.5  3.5 - 5.1 mEq/L Final   Chloride 11/08/2020 102  96 - 112 mEq/L Final   CO2 11/08/2020 25  19 - 32 mEq/L Final   Glucose, Bld 11/08/2020 218 (A) 70 - 99 mg/dL Final   BUN 11/08/2020 22  6 - 23 mg/dL Final   Creatinine, Ser 11/08/2020 1.42 (A) 0.40 - 1.20 mg/dL Final   GFR 11/08/2020 33.70 (A) >60.00 mL/min Final   Calculated using the CKD-EPI Creatinine Equation (2021)   Calcium 11/08/2020 9.9  8.4 - 10.5 mg/dL Final   Hgb A1c MFr Bld 11/08/2020 8.9 (A) 4.6 - 6.5 % Final   Glycemic Control Guidelines for People with Diabetes:Non Diabetic:  <6%Goal of Therapy: <7%Additional Action Suggested:  >8%      Allergies as of 11/10/2020   No Known Allergies       Medication List        Accurate as of November 10, 2020  8:53 AM. If you have any questions, ask your nurse or doctor.          Accu-Chek FastClix Lancets Misc Use Accu Chek Fastclix to check blood sugar 4 times daily.   aspirin EC 81 MG tablet Take 1 tablet (81 mg total) by mouth daily.   B-D UF III MINI PEN NEEDLES 31G X 5 MM Misc Generic drug: Insulin Pen Needle USE TO INJECT MEDICATION THREE TIMES DAILY   benazepril 20 MG tablet Commonly known as: LOTENSIN TAKE 1 TABLET(20 MG) BY MOUTH TWICE DAILY   calcium carbonate 500 MG chewable tablet Commonly known as: TUMS - dosed in mg elemental calcium Chew 1 tablet by mouth daily as needed for heartburn (indigestion).   carvedilol 12.5 MG tablet Commonly known as: COREG Take 1 tablet (12.5 mg total) by mouth 2 (two) times daily.   cholecalciferol 1000 units tablet Commonly known as: VITAMIN D Take 1,000 Units by mouth daily.   Co Q-10 100 MG Caps Take 1 capsule by mouth 2 (two) times daily.   furosemide 20 MG tablet Commonly known as: LASIX Take 1 tablet (20 mg total) by mouth daily as needed for fluid or edema (or weight gain).   HumaLOG KwikPen 100 UNIT/ML KwikPen Generic drug: insulin lispro INJECT 6 TO 8 UNITS INTO THE SKIN TWICE DAILY   levothyroxine 75 MCG tablet Commonly known as: SYNTHROID TAKE 1 TABLET BY MOUTH EVERY DAY   memantine 5 MG tablet Commonly known as: NAMENDA Take 5 mg by mouth at bedtime.   metFORMIN 750 MG 24 hr tablet Commonly known as: GLUCOPHAGE-XR TAKE 1 TABLET BY MOUTH EVERY DAY   multivitamins ther. w/minerals Tabs tablet Take 1 tablet by mouth daily.   OneTouch Verio test strip Generic drug: glucose blood USE TO CHECK BLOOD SUGAR FOUR TIMES DAILY AS DIRECTED   rosuvastatin 20 MG tablet Commonly known as: CRESTOR TAKE 1 TABLET BY MOUTH EVERY DAY   Synjardy 5-500 MG Tabs Generic drug: Empagliflozin-metFORMIN HCl Take 1 tablet by mouth 2 (two) times daily. Takes  occassionally What changed: when to take this Changed by: Elayne Snare, MD   Toujeo SoloStar 300 UNIT/ML Solostar Pen Generic drug: insulin glargine (1 Unit Dial) ADMINISTER 22 UNITS UNDER THE SKIN DAILY   vitamin B-12 100 MCG tablet Commonly known as: CYANOCOBALAMIN Take 100 mcg by mouth daily.  Allergies: No Known Allergies  Past Medical History:  Diagnosis Date   Aortic valve disorders    S/P AVR 06/2012   Arthritis    Asthma    AS CHILD    Depression    Diabetes mellitus    Heart murmur    Hyperlipidemia    Hypertension    Hypothyroidism    Ischemic heart disease    with prior CABG x 2 in 2014 along with AVR   Lumbago    Shortness of breath    Thyroid disease     Past Surgical History:  Procedure Laterality Date   AORTIC VALVE REPLACEMENT N/A 06/25/2012   Procedure: AORTIC VALVE REPLACEMENT (AVR);  Surgeon: Grace Isaac, MD;  Location: [place];  Service: Open Heart Surgery;  Laterality: N/A;   BREAST BIOPSY Right 08/2016   benign   BREAST SURGERY     LT BREAST BX BENIGN   CORONARY ARTERY BYPASS GRAFT N/A 06/25/2012   Procedure: Coronary artery bypass graft  times two using left internal mammary artery and right leg greater saphenous vein;  Surgeon: Grace Isaac, MD;  Location: [facility];  Service: Open Heart Surgery;  Laterality: N/A;   INTRAOPERATIVE TRANSESOPHAGEAL ECHOCARDIOGRAM N/A 06/25/2012   Procedure: INTRAOPERATIVE TRANSESOPHAGEAL ECHOCARDIOGRAM;  Surgeon: Grace Isaac, MD;  Location: [place];  Service: Open Heart Surgery;  Laterality: N/A;   TUBAL LIGATION      Family History  Problem Relation Age of Onset   Heart disease Father     Social History:  reports that she has never smoked. She has never used smokeless tobacco. She reports that she does not drink alcohol and does not use drugs.  Review of Systems:   HYPERTENSION: Blood pressure is followed by PCP and cardiologist Treatment regimen includes losartan and  carvedilol Benazepril was stopped by PCP  Not recently checking BP at home  BP Readings from Last 3 Encounters:  11/10/20 110/80  09/08/20 134/82  08/17/20 (!) 152/92   Renal dysfunction: This is new, currently not on Lasix usually   Lab Results  Component Value Date   CREATININE 1.42 (H) 11/08/2020   CREATININE 1.09 08/12/2020   CREATININE 1.00 05/14/2020    HYPOTHYROIDISM: She has had long-standing hypothyroidism  She is regular with her levothyroxine 75 g dose  TSH has been consistently in the normal range   Lab Results  Component Value Date   TSH 1.01 11/08/2020   TSH 1.45 08/12/2020   TSH 0.85 02/09/2020   FREET4 1.22 04/22/2019   FREET4 1.03 02/25/2019   FREET4 1.02 08/19/2018    HYPERLIPIDEMIA: The lipid abnormality consists of elevated LDL; has been  controlled with Crestor 20 mg LDL is below 70  Lab Results  Component Value Date   CHOL 101 08/12/2020   HDL 38.60 (L) 08/12/2020   LDLCALC 43 08/12/2020   LDLDIRECT 102.8 11/28/2013   TRIG 98.0 08/12/2020   CHOLHDL 3 08/12/2020  '    She does see a podiatrist regularly for onychomycosis  Last foot exam was in 09/2020 with podiatrist: No sensory loss on exam   Covid vaccine dates: 2/11  And 07/07/19   Examination:   BP 110/80 (Patient Position: Standing)   Pulse 70   Ht 5\' 1"  (1.549 m)   Wt 157 lb 9.6 oz (71.5 kg)   SpO2 99%   BMI 29.78 kg/m   Body mass index is 29.78 kg/m.    ASSESSMENT/ PLAN:   Diabetes type 2, uncontrolled   See  history of present illness for a discussion of the blood sugar patterns, recent management and problems identified  A1c is higher at 8.9  She is on basal bolus insulin and metformin  Blood sugars are much higher related to intercurrent illnesses but appear to be persistently high Compared to before her fasting readings are also much higher, on an average about 50 mg more than before Blood sugars after meals are averaging over 200 consistently She has lost  weight likely from hyperglycemia Also has not been eating consistent meals   Recommendations: She will need to restart her 5/500 Synjardy, 1 tablet with breakfast daily consistently and explained that this will help her blood sugar control This may need to be adjusted further based on follow-up with renal function Now take 24 units of Toujeo Increase mealtime dose for 6-8 up to 10-12 units unless eating just a snack Check blood sugars 4 times a day Will discuss freestyle libre again on the next visit but she has been reluctant to do this previously She can also start walking for exercise when she starts feeling better  HYPERTENSION: Blood pressure is unusually low from intercurrent illness, some dehydration especially with hyperglycemia  Because of her abnormal renal function she will need to hold off on her losartan for now  She will start checking blood pressure regularly at home She is going to follow-up with her PCP and cardiologist soon  History of microalbuminuria: We will need follow-up on the next visit Also has renal dysfunction as above  Hypothyroidism: Controlled with 75 mcg consistently  Patient Instructions  Make sure you check your sugars before each meal and at 7 or 8 PM  INCREASE Toujeo 24 units up to in am, go up to 26 next week if morning sugars are still over 140  HUMALOG: Take 6 units if eating only a small portion but 10 to 12 units with lunch and dinner when eating a meal.   Blood sugar should not go up more than 180 after eating.  Start SYNJARDY 1 tablet at breakfast daily  STOP losartan Check blood pressure daily, blood pressure should be between 120-130/70-80 May start half tablet losartan only if blood pressure is more than 140  Please have Dr. Marlou Sa recheck your kidney test at next visit  Continue same dose of thyroid supplement   Patient Instructions  Make sure you check your sugars before each meal and at 7 or 8 PM  INCREASE Toujeo 24 units  up to in am, go up to 26 next week if morning sugars are still over 140  HUMALOG: Take 6 units if eating only a small portion but 10 to 12 units with lunch and dinner when eating a meal.   Blood sugar should not go up more than 180 after eating.  Start SYNJARDY 1 tablet at breakfast daily  STOP losartan Check blood pressure daily, blood pressure should be between 120-130/70-80 May start half tablet losartan only if blood pressure is more than 140  Please have Dr. Marlou Sa recheck your kidney test at next visit  Continue same dose of thyroid supplement      Elayne Snare 11/10/2020, 8:53 AM   Note: This office note was prepared with Dragon voice recognition system technology. Any transcriptional errors that result from this process are unintentional.

## 2020-11-10 ENCOUNTER — Encounter: Payer: Self-pay | Admitting: Endocrinology

## 2020-11-10 ENCOUNTER — Ambulatory Visit (INDEPENDENT_AMBULATORY_CARE_PROVIDER_SITE_OTHER): Payer: Medicare Other | Admitting: Endocrinology

## 2020-11-10 ENCOUNTER — Other Ambulatory Visit: Payer: Self-pay

## 2020-11-10 VITALS — BP 110/80 | HR 70 | Ht 61.0 in | Wt 157.6 lb

## 2020-11-10 DIAGNOSIS — E063 Autoimmune thyroiditis: Secondary | ICD-10-CM

## 2020-11-10 DIAGNOSIS — N289 Disorder of kidney and ureter, unspecified: Secondary | ICD-10-CM | POA: Diagnosis not present

## 2020-11-10 DIAGNOSIS — Z794 Long term (current) use of insulin: Secondary | ICD-10-CM

## 2020-11-10 DIAGNOSIS — E1129 Type 2 diabetes mellitus with other diabetic kidney complication: Secondary | ICD-10-CM | POA: Diagnosis not present

## 2020-11-10 DIAGNOSIS — I259 Chronic ischemic heart disease, unspecified: Secondary | ICD-10-CM

## 2020-11-10 DIAGNOSIS — R809 Proteinuria, unspecified: Secondary | ICD-10-CM

## 2020-11-10 DIAGNOSIS — E1165 Type 2 diabetes mellitus with hyperglycemia: Secondary | ICD-10-CM

## 2020-11-10 MED ORDER — SYNJARDY 5-500 MG PO TABS: 1.0000 | ORAL_TABLET | Freq: Two times a day (BID) | ORAL | 1 refills | Status: DC

## 2020-11-10 NOTE — Patient Instructions (Addendum)
Make sure you check your sugars before each meal and at 7 or 8 PM  INCREASE Toujeo 24 units up to in am, go up to 26 next week if morning sugars are still over 140  HUMALOG: Take 6 units if eating only a small portion but 10 to 12 units with lunch and dinner when eating a meal.   Blood sugar should not go up more than 180 after eating.  Start SYNJARDY 1 tablet at breakfast daily  STOP losartan Check blood pressure daily, blood pressure should be between 120-130/70-80 May start half tablet losartan only if blood pressure is more than 140  Please have Dr. Marlou Sa recheck your kidney test at next visit  Continue same dose of thyroid supplement

## 2020-11-23 NOTE — Progress Notes (Deleted)
Cardiology Office Note:    Date:  11/23/2020   ID:  [name], DOB 1932/05/10, MRN [id number]  PCP:  Rogers Blocker, MD   [facility] Providers Cardiologist:  None {  Referring MD: Rogers Blocker, MD    History of Present Illness:    [name] is a 87 y.o. female with a hx of prior AVR and CABG x 2 per Dr. Servando Snare back in 2014  (LIMA to the LAD and SVG to the LCX, with a pericardial Edwards tissue valve #21 mm), post op atrial fib, HLD, HTN, DM, depression and hypothyroidism who was previously followed by Truitt Merle who now returns to clinic for follow-up.  Last seen in clinic on 09/08/20 where she was suffering from depression. No CV symptoms at that time. Was compliant with medications.  Today,   Past Medical History:  Diagnosis Date   Aortic valve disorders    S/P AVR 06/2012   Arthritis    Asthma    AS CHILD    Depression    Diabetes mellitus    Heart murmur    Hyperlipidemia    Hypertension    Hypothyroidism    Ischemic heart disease    with prior CABG x 2 in 2014 along with AVR   Lumbago    Shortness of breath    Thyroid disease     Past Surgical History:  Procedure Laterality Date   AORTIC VALVE REPLACEMENT N/A 06/25/2012   Procedure: AORTIC VALVE REPLACEMENT (AVR);  Surgeon: Grace Isaac, MD;  Location: [place];  Service: Open Heart Surgery;  Laterality: N/A;   BREAST BIOPSY Right 08/2016   benign   BREAST SURGERY     LT BREAST BX BENIGN   CORONARY ARTERY BYPASS GRAFT N/A 06/25/2012   Procedure: Coronary artery bypass graft  times two using left internal mammary artery and right leg greater saphenous vein;  Surgeon: Grace Isaac, MD;  Location: [place];  Service: Open Heart Surgery;  Laterality: N/A;   INTRAOPERATIVE TRANSESOPHAGEAL ECHOCARDIOGRAM N/A 06/25/2012   Procedure: INTRAOPERATIVE TRANSESOPHAGEAL ECHOCARDIOGRAM;  Surgeon: Grace Isaac, MD;  Location: [place];  Service: Open Heart Surgery;  Laterality: N/A;   TUBAL  LIGATION      Current Medications: No outpatient medications have been marked as taking for the 11/30/20 encounter (Appointment) with Freada Bergeron, MD.     Allergies:   Patient has no known allergies.   Social History   Socioeconomic History   Marital status: Widowed    Spouse name: Not on file   Number of children: Not on file   Years of education: Not on file   Highest education level: Not on file  Occupational History   Not on file  Tobacco Use   Smoking status: Never   Smokeless tobacco: Never  Vaping Use   Vaping Use: Never used  Substance and Sexual Activity   Alcohol use: No   Drug use: No   Sexual activity: Not Currently  Other Topics Concern   Not on file  Social History Narrative   Not on file   Social Determinants of Health   Financial Resource Strain: Not on file  Food Insecurity: Not on file  Transportation Needs: Not on file  Physical Activity: Not on file  Stress: Not on file  Social Connections: Not on file     Family History: The patient's family history includes Heart disease in her father.  ROS:   Please see the history of  present illness.    Review of Systems  Constitutional:  Negative for chills and fever.  HENT:  Negative for hearing loss.   Eyes:  Negative for blurred vision and redness.  Respiratory:  Negative for shortness of breath.   Cardiovascular:  Negative for chest pain, palpitations, orthopnea, claudication, leg swelling and PND.  Gastrointestinal:  Negative for melena, nausea and vomiting.  Genitourinary:  Negative for flank pain.  Musculoskeletal:  Positive for falls and joint pain.  Neurological:  Negative for dizziness and loss of consciousness.  Endo/Heme/Allergies:  Negative for polydipsia.  Psychiatric/Behavioral:  Negative for substance abuse.    EKGs/Labs/Other Studies Reviewed:    The following studies were reviewed today: Echo Study Conclusions 11/2016   - Left ventricle: The cavity size was normal.  Systolic function was   normal. The estimated ejection fraction was in the range of 55%   to 60%. Wall motion was normal; there were no regional wall   motion abnormalities. There was an increased relative   contribution of atrial contraction to ventricular filling.   Doppler parameters are consistent with abnormal left ventricular   relaxation (grade 1 diastolic dysfunction). - Aortic valve: A pericardial bioprosthesis was present and   functioning normally. Mean gradient (S): 9 mm Hg. Peak gradient   (S): 17 mm Hg. - Mitral valve: Calcified annulus. Mildly thickened leaflets .   There was mild to moderate regurgitation. - Left atrium: The atrium was mildly dilated. - Atrial septum: There was increased thickness of the septum,   consistent with lipomatous hypertrophy. - Pulmonary arteries: PA peak pressure: 42 mm Hg (S).   Impressions:   - The right ventricular systolic pressure was increased consistent   with moderate pulmonary hypertension.  Myoview Impression from 06/2013 Exercise Capacity:  Lexiscan with no exercise. BP Response:  Normal blood pressure response. Clinical Symptoms:  No significant symptoms noted. ECG Impression:  There are scattered PVCs. Comparison with Prior Nuclear Study: No previous nuclear study performed   Overall Impression:  Low risk stress nuclear study small fixed distal anterior artifact which could be breast attenuation or other artifact.   LV Ejection Fraction: 66%.  LV Wall Motion:  Normal Wall Motion  EKG:  No new tracing ***  Recent Labs: 08/12/2020: ALT 10 11/08/2020: BUN 22; Creatinine, Ser 1.42; Potassium 3.5; Sodium 138; TSH 1.01  Recent Lipid Panel    Component Value Date/Time   CHOL 101 08/12/2020 1010   CHOL 148 10/01/2019 1211   TRIG 98.0 08/12/2020 1010   HDL 38.60 (L) 08/12/2020 1010   HDL 54 10/01/2019 1211   CHOLHDL 3 08/12/2020 1010   VLDL 19.6 08/12/2020 1010   LDLCALC 43 08/12/2020 1010   LDLCALC 79 10/01/2019 1211    LDLDIRECT 102.8 11/28/2013 0827     Risk Assessment/Calculations:       Physical Exam:    VS:  There were no vitals taken for this visit.    Wt Readings from Last 3 Encounters:  11/10/20 157 lb 9.6 oz (71.5 kg)  09/08/20 161 lb (73 kg)  08/17/20 164 lb (74.4 kg)     GEN:  Well nourished, well developed in no acute distress, elderly female HEENT: Normal NECK: No JVD; No carotid bruits CARDIAC: RRR, soft systolic murmur. No rubs, gallops RESPIRATORY:  Clear to auscultation without rales, wheezing or rhonchi  ABDOMEN: Soft, non-tender, non-distended MUSCULOSKELETAL:  Trace right ankle edema. Warm SKIN: Warm and dry NEUROLOGIC:  Alert and oriented x 3 PSYCHIATRIC:  Normal affect  ASSESSMENT:    No diagnosis found.  PLAN:    In order of problems listed above:  #CAD with remote history of CABG/AVR in 2014: Doing well with no anginal symptoms. Myoview 2015 without ischemia. TTE 10/07/20 with LVEF 60-65%, normal function bioprosthetic aortic valve with mean gradient 9mHg. -Continue ASA '81mg'$  daily -Continue benzapril '20mg'$  daily -Continue coreg 12.'5mg'$  BID -Continue lasix '20mg'$  daily -Continue crestor '20mg'$  daily -AoV prosthesis with normal function on TTE 09/2020 with mean gradient 134mg  #HTN: -Continue benzapril '20mg'$  daily -Continue coreg 12.'5mg'$  BID -Continue lasix '20mg'$  daily  #DMII: -Management per PCP  #HLD: Well controlled with LDL 43 in 07/2020. -Continue crestor '20mg'$  daily    Medication Adjustments/Labs and Tests Ordered: Current medicines are reviewed at length with the patient today.  Concerns regarding medicines are outlined above.  No orders of the defined types were placed in this encounter.  No orders of the defined types were placed in this encounter.   There are no Patient Instructions on file for this visit.    Signed, HeFreada BergeronMD  11/23/2020 9:02 AM    [place]

## 2020-11-30 ENCOUNTER — Ambulatory Visit: Payer: Medicare Other | Admitting: Cardiology

## 2020-12-08 ENCOUNTER — Other Ambulatory Visit: Payer: Self-pay

## 2020-12-08 ENCOUNTER — Other Ambulatory Visit (INDEPENDENT_AMBULATORY_CARE_PROVIDER_SITE_OTHER): Payer: Medicare Other

## 2020-12-08 DIAGNOSIS — E1165 Type 2 diabetes mellitus with hyperglycemia: Secondary | ICD-10-CM

## 2020-12-08 DIAGNOSIS — Z794 Long term (current) use of insulin: Secondary | ICD-10-CM

## 2020-12-08 LAB — BASIC METABOLIC PANEL
BUN: 13 mg/dL (ref 6–23)
CO2: 26 mEq/L (ref 19–32)
Calcium: 9.8 mg/dL (ref 8.4–10.5)
Chloride: 102 mEq/L (ref 96–112)
Creatinine, Ser: 0.96 mg/dL (ref 0.40–1.20)
GFR: 53.88 mL/min — ABNORMAL LOW (ref >60.00)
Glucose, Bld: 145 mg/dL — ABNORMAL HIGH (ref 70–99)
Potassium: 3.1 mEq/L — ABNORMAL LOW (ref 3.5–5.1)
Sodium: 139 mEq/L (ref 135–145)

## 2020-12-09 ENCOUNTER — Other Ambulatory Visit: Payer: Medicare Other

## 2020-12-09 LAB — FRUCTOSAMINE: Fructosamine: 288 umol/L — ABNORMAL HIGH (ref 0–285)

## 2020-12-14 ENCOUNTER — Ambulatory Visit (INDEPENDENT_AMBULATORY_CARE_PROVIDER_SITE_OTHER): Payer: Medicare Other | Admitting: Endocrinology

## 2020-12-14 ENCOUNTER — Encounter: Payer: Self-pay | Admitting: Endocrinology

## 2020-12-14 ENCOUNTER — Other Ambulatory Visit: Payer: Self-pay | Admitting: Endocrinology

## 2020-12-14 ENCOUNTER — Other Ambulatory Visit: Payer: Self-pay

## 2020-12-14 VITALS — BP 140/80 | HR 90 | Ht 61.0 in | Wt 154.2 lb

## 2020-12-14 DIAGNOSIS — I259 Chronic ischemic heart disease, unspecified: Secondary | ICD-10-CM | POA: Diagnosis not present

## 2020-12-14 DIAGNOSIS — E876 Hypokalemia: Secondary | ICD-10-CM

## 2020-12-14 DIAGNOSIS — N289 Disorder of kidney and ureter, unspecified: Secondary | ICD-10-CM | POA: Diagnosis not present

## 2020-12-14 DIAGNOSIS — E1129 Type 2 diabetes mellitus with other diabetic kidney complication: Secondary | ICD-10-CM

## 2020-12-14 DIAGNOSIS — R809 Proteinuria, unspecified: Secondary | ICD-10-CM | POA: Diagnosis not present

## 2020-12-14 DIAGNOSIS — E063 Autoimmune thyroiditis: Secondary | ICD-10-CM

## 2020-12-14 DIAGNOSIS — Z794 Long term (current) use of insulin: Secondary | ICD-10-CM

## 2020-12-14 DIAGNOSIS — E1165 Type 2 diabetes mellitus with hyperglycemia: Secondary | ICD-10-CM | POA: Diagnosis not present

## 2020-12-14 LAB — URINALYSIS, ROUTINE W REFLEX MICROSCOPIC
Bilirubin Urine: NEGATIVE
Ketones, ur: NEGATIVE
Nitrite: POSITIVE — AB
Specific Gravity, Urine: 1.025 (ref 1.000–1.030)
Total Protein, Urine: 100 — AB
Urine Glucose: NEGATIVE
Urobilinogen, UA: 1 (ref 0.0–1.0)
pH: 6 (ref 5.0–8.0)

## 2020-12-14 LAB — MICROALBUMIN / CREATININE URINE RATIO
Creatinine,U: 140.6 mg/dL
Microalb Creat Ratio: 37.6 mg/g — ABNORMAL HIGH (ref 0.0–30.0)
Microalb, Ur: 52.9 mg/dL — ABNORMAL HIGH (ref 0.0–1.9)

## 2020-12-14 MED ORDER — BD PEN NEEDLE MINI U/F 31G X 5 MM MISC: 3 refills | Status: AC

## 2020-12-14 MED ORDER — POTASSIUM CHLORIDE CRYS ER 20 MEQ PO TBCR: 20.0000 meq | EXTENDED_RELEASE_TABLET | Freq: Two times a day (BID) | ORAL | 0 refills | Status: AC

## 2020-12-14 NOTE — Patient Instructions (Addendum)
Lab Results  Component Value Date   CREATININE 0.96 12/08/2020   BUN 13 12/08/2020   NA 139 12/08/2020   K 3.1 (L) 12/08/2020   CL 102 12/08/2020   CO2 26 12/08/2020   Take 8 units at lunch unless small meal  Toujeo 22   Take Synjardy daily in am

## 2020-12-14 NOTE — Progress Notes (Signed)
[name] 87 y.o.            Reason for Appointment: Endocrinology follow-up   History of Present Illness   Diagnosis: Type 2 DIABETES MELITUS, date of diagnosis:   2000   She has been on insulin since 2004 with difficulty controlling adequately because of compliance with diet and variability in blood sugars. Her previous records are not available at present, however she has been on basal insulin with mealtime coverage since at least 2007 She has been tried on Byetta previously but did not tolerate this well She tried Victoza but didn't not benefit from Victoza 0.6 dosage; had nausea from 1.2 mg; did not continue this because of this side effect She also was given a trial of the V-go pump but she subjectively did not like this  RECENT history:    INSULIN: Toujeo 24  units in morning, HUMALOG 6 units before meals  Oral hypoglycemic drugs: Metformin ER 750 mg in pm, Synjardy  5/500    A1c is last higher at 8.9 compared to 8  She has the following blood sugar patterns and problems identified She was having significantly high readings in July after her UTI problems, blood sugars were averaging 233 on her last visit  She had also not taking her Synjardy  With increasing her Toujeo by 4 units as well as restarting Synjardy her blood sugars appear to be overall better  However as before her blood sugars are averaging higher in the late afternoon when she checks them Only rarely will have a good blood sugar around suppertime She does have occasional readings over 300 late afternoon from missing the lunchtime dose She will also likely take excessive insulin sometimes at suppertime when she is eating smaller meals or the blood sugar is high causing hypoglycemia Appears to be gradually losing weight recently  She thinks her appetite has been somewhat less    .         Monitors blood glucose:  about 2-3 times daily     Glucometer: One Touch Verio.          Blood Glucose readings  from download:    PRE-MEAL Fasting Lunch Dinner Bedtime Overall  Glucose range: 96-187    45-467  Mean/median: 137  200 80 163   POST-MEAL PC Breakfast PC Lunch PC Dinner  Glucose range:   ?  Mean/median:      Previously:   PRE-MEAL Fasting Midday Dinner Bedtime Overall  Glucose range: 159-233    129-438  Mean/median: 182 238 ?  233+/-72   POST-MEAL PC Breakfast PC Lunch PC Dinner  Glucose range:     Mean/median:  307 304        Meals: 3 meals per day:  breakfast is At 7 am;  lunch 12-2 pm, dinner 5 pm Intake is quite variable at different meals, usually lighter supper    Wt Readings from Last 3 Encounters:  12/14/20 154 lb 3.2 oz (69.9 kg)  11/10/20 157 lb 9.6 oz (71.5 kg)  09/08/20 161 lb (73 kg)    Lab Results  Component Value Date   HGBA1C 8.9 (H) 11/08/2020   HGBA1C 8.0 (H) 08/12/2020   HGBA1C 7.8 (H) 05/14/2020   Lab Results  Component Value Date   MICROALBUR 52.9 (H) 12/14/2020   LDLCALC 43 08/12/2020   CREATININE 0.96 12/08/2020    Office Visit on 12/14/2020  Component Date Value Ref Range Status   Color, Urine 12/14/2020 YELLOW  Yellow;Lt. Yellow;Straw;Dark Yellow;Amber;Green;Red;Brown  Final   APPearance 12/14/2020 Cloudy (A) Clear;Turbid;Slightly Cloudy;Cloudy Final   Specific Gravity, Urine 12/14/2020 1.025  1.000 - 1.030 Final   pH 12/14/2020 6.0  5.0 - 8.0 Final   Total Protein, Urine 12/14/2020 100 (A) Negative Final   Urine Glucose 12/14/2020 NEGATIVE  Negative Final   Ketones, ur 12/14/2020 NEGATIVE  Negative Final   Bilirubin Urine 12/14/2020 NEGATIVE  Negative Final   Hgb urine dipstick 12/14/2020 SMALL (A) Negative Final   Urobilinogen, UA 12/14/2020 1.0  0.0 - 1.0 Final   Leukocytes,Ua 12/14/2020 MODERATE (A) Negative Final   Nitrite 12/14/2020 POSITIVE (A) Negative Final   WBC, UA 12/14/2020 21-50/hpf (A) 0-2/hpf Final   RBC / HPF 12/14/2020 3-6/hpf (A) 0-2/hpf Final   Squamous Epithelial / LPF 12/14/2020 Rare(0-4/hpf)   Rare(0-4/hpf) Final   Bacteria, UA 12/14/2020 Many(>50/hpf) (A) None Final   Amorphous 12/14/2020 Present (A) None;Present Final   Microalb, Ur 12/14/2020 52.9 (A) 0.0 - 1.9 mg/dL Final   Creatinine,U 12/14/2020 140.6  mg/dL Final   Microalb Creat Ratio 12/14/2020 37.6 (A) 0.0 - 30.0 mg/g Final  Lab on 12/08/2020  Component Date Value Ref Range Status   Fructosamine 12/08/2020 288 (A) 0 - 285 umol/L Final   Comment: Published reference interval for apparently healthy subjects between age 24 and 84 is 37 - 285 umol/L and in a poorly controlled diabetic population is 228 - 563 umol/L with a mean of 396 umol/L.    Sodium 12/08/2020 139  135 - 145 mEq/L Final   Potassium 12/08/2020 3.1 (A) 3.5 - 5.1 mEq/L Final   Chloride 12/08/2020 102  96 - 112 mEq/L Final   CO2 12/08/2020 26  19 - 32 mEq/L Final   Glucose, Bld 12/08/2020 145 (A) 70 - 99 mg/dL Final   BUN 12/08/2020 13  6 - 23 mg/dL Final   Creatinine, Ser 12/08/2020 0.96  0.40 - 1.20 mg/dL Final   GFR 12/08/2020 53.88 (A) >60.00 mL/min Final   Calculated using the CKD-EPI Creatinine Equation (2021)   Calcium 12/08/2020 9.8  8.4 - 10.5 mg/dL Final     Allergies as of 12/14/2020   No Known Allergies      Medication List        Accurate as of December 14, 2020  1:28 PM. If you have any questions, ask your nurse or doctor.          STOP taking these medications    benazepril 20 MG tablet Commonly known as: LOTENSIN Stopped by: Elayne Snare, MD       TAKE these medications    Accu-Chek FastClix Lancets Misc Use Accu Chek Fastclix to check blood sugar 4 times daily.   aspirin EC 81 MG tablet Take 1 tablet (81 mg total) by mouth daily.   B-D UF III MINI PEN NEEDLES 31G X 5 MM Misc Generic drug: Insulin Pen Needle USE TO INJECT MEDICATION THREE TIMES DAILY   calcium carbonate 500 MG chewable tablet Commonly known as: TUMS - dosed in mg elemental calcium Chew 1 tablet by mouth daily as needed for heartburn  (indigestion).   carvedilol 12.5 MG tablet Commonly known as: COREG Take 1 tablet (12.5 mg total) by mouth 2 (two) times daily.   cholecalciferol 1000 units tablet Commonly known as: VITAMIN D Take 1,000 Units by mouth daily.   Co Q-10 100 MG Caps Take 1 capsule by mouth 2 (two) times daily.   furosemide 20 MG tablet Commonly known as: LASIX Take 1 tablet (20 mg total)  by mouth daily as needed for fluid or edema (or weight gain).   HumaLOG KwikPen 100 UNIT/ML KwikPen Generic drug: insulin lispro INJECT 6 TO 8 UNITS INTO THE SKIN TWICE DAILY   levothyroxine 75 MCG tablet Commonly known as: SYNTHROID TAKE 1 TABLET BY MOUTH EVERY DAY   losartan 50 MG tablet Commonly known as: COZAAR Take 50 mg by mouth daily.   memantine 5 MG tablet Commonly known as: NAMENDA Take 5 mg by mouth at bedtime.   metFORMIN 750 MG 24 hr tablet Commonly known as: GLUCOPHAGE-XR TAKE 1 TABLET BY MOUTH EVERY DAY   multivitamins ther. w/minerals Tabs tablet Take 1 tablet by mouth daily.   OneTouch Verio test strip Generic drug: glucose blood USE TO CHECK BLOOD SUGAR FOUR TIMES DAILY AS DIRECTED   potassium chloride SA 20 MEQ tablet Commonly known as: KLOR-CON Take 1 tablet (20 mEq total) by mouth 2 (two) times daily. Started by: Elayne Snare, MD   rosuvastatin 20 MG tablet Commonly known as: CRESTOR TAKE 1 TABLET BY MOUTH EVERY DAY   Synjardy 5-500 MG Tabs Generic drug: Empagliflozin-metFORMIN HCl Take 1 tablet by mouth 2 (two) times daily. Takes occassionally   Toujeo SoloStar 300 UNIT/ML Solostar Pen Generic drug: insulin glargine (1 Unit Dial) ADMINISTER 22 UNITS UNDER THE SKIN DAILY   vitamin B-12 100 MCG tablet Commonly known as: CYANOCOBALAMIN Take 100 mcg by mouth daily.        Allergies: No Known Allergies  Past Medical History:  Diagnosis Date   Aortic valve disorders    S/P AVR 06/2012   Arthritis    Asthma    AS CHILD    Depression    Diabetes mellitus     Heart murmur    Hyperlipidemia    Hypertension    Hypothyroidism    Ischemic heart disease    with prior CABG x 2 in 2014 along with AVR   Lumbago    Shortness of breath    Thyroid disease     Past Surgical History:  Procedure Laterality Date   AORTIC VALVE REPLACEMENT N/A 06/25/2012   Procedure: AORTIC VALVE REPLACEMENT (AVR);  Surgeon: Grace Isaac, MD;  Location: [place];  Service: Open Heart Surgery;  Laterality: N/A;   BREAST BIOPSY Right 08/2016   benign   BREAST SURGERY     LT BREAST BX BENIGN   CORONARY ARTERY BYPASS GRAFT N/A 06/25/2012   Procedure: Coronary artery bypass graft  times two using left internal mammary artery and right leg greater saphenous vein;  Surgeon: Grace Isaac, MD;  Location: [place];  Service: Open Heart Surgery;  Laterality: N/A;   INTRAOPERATIVE TRANSESOPHAGEAL ECHOCARDIOGRAM N/A 06/25/2012   Procedure: INTRAOPERATIVE TRANSESOPHAGEAL ECHOCARDIOGRAM;  Surgeon: Grace Isaac, MD;  Location: [place];  Service: Open Heart Surgery;  Laterality: N/A;   TUBAL LIGATION      Family History  Problem Relation Age of Onset   Heart disease Father     Social History:  reports that she has never smoked. She has never used smokeless tobacco. She reports that she does not drink alcohol and does not use drugs.  Review of Systems:   HYPERTENSION: Blood pressure is followed by PCP and cardiologist Treatment regimen includes losartan 50 and carvedilol On her last visit in July since her kidney function was abnormal and blood pressure was low normal she was told to stop losartan but she is still taking a half a tablet  She will occasionally check blood pressure  at home, not sure what it is but it may be slightly higher recently   BP Readings from Last 3 Encounters:  12/14/20 140/80  11/10/20 110/80  09/08/20 134/82   Renal dysfunction:   Improved with reducing losartan and possibly better hydration She will take Lasix only every couple of weeks    Lab Results  Component Value Date   CREATININE 0.96 12/08/2020   CREATININE 1.42 (H) 11/08/2020   CREATININE 1.09 08/12/2020    HYPOTHYROIDISM: She has had long-standing hypothyroidism  She is regular with her levothyroxine 75 g dose  TSH has been consistently in the normal range   Lab Results  Component Value Date   TSH 1.01 11/08/2020   TSH 1.45 08/12/2020   TSH 0.85 02/09/2020   FREET4 1.22 04/22/2019   FREET4 1.03 02/25/2019   FREET4 1.02 08/19/2018    HYPERLIPIDEMIA: The lipid abnormality consists of elevated LDL; has been  controlled with Crestor 20 mg LDL is below 70  Lab Results  Component Value Date   CHOL 101 08/12/2020   HDL 38.60 (L) 08/12/2020   LDLCALC 43 08/12/2020   LDLDIRECT 102.8 11/28/2013   TRIG 98.0 08/12/2020   CHOLHDL 3 08/12/2020  '    She does see a podiatrist regularly for onychomycosis  Last foot exam was in 09/2020 with podiatrist: No sensory loss on exam   Covid vaccine dates: 2/11  And 07/07/19   Examination:   BP 140/80   Pulse 90   Ht '5\' 1"'$  (1.549 m)   Wt 154 lb 3.2 oz (69.9 kg)   SpO2 96%   BMI 29.14 kg/m   Body mass index is 29.14 kg/m.    ASSESSMENT/ PLAN:   Diabetes type 2, uncontrolled   See history of present illness for a discussion of the blood sugar patterns, recent management and problems identified  A1c is last at 8.9 Fructosamine of 288 indicates significantly better control recently  She is on basal bolus insulin and Synjardy 5/500+ metformin ER 750 mg  Blood sugars are improving but still mostly high in the evenings averaging about 200 at that time  Fasting readings are better with increasing basal insulin Also may been benefiting from taking Synjardy although she is not taking it this week    Recommendations: She will need to restart her 5/500 Synjardy, 1 tablet with breakfast daily, reassured her that this is not causing any side effects  Reduce Toujeo to 22 as a precaution  Take lunchtime  Humalog consistently  Reduce suppertime dose if eating a small meal or less carbohydrates, may even skip if not eating any carbohydrate Increase mealtime dose at lunchtime to 8 units unless eating a small meal Check blood sugars consistently midday and afternoon Will again try to discuss freestyle libre on the next visit, she has been reluctant to do this previously  HYPERTENSION: Blood pressure is high normal although not clear if this is from mild anxiety  Renal function improved   She will start checking blood pressure regularly at home She is going to follow-up with her PCP today and will let them decide on her blood pressure regimen  History of microalbuminuria: Appears slightly improved at 37 compared to 55  Hypokalemia: This is likely to be from resolution of her renal dysfunction as she does not take much Lasix  she will start potassium 20 mEq twice a day for 2 weeks and then follow-up with PCP  Labs forwarded to PCP    Elayne Snare 12/14/2020, 1:28 PM  Note: This office note was prepared with Estate agent. Any transcriptional errors that result from this process are unintentional.

## 2020-12-14 NOTE — Addendum Note (Signed)
Addended by: Cinda Quest on: 12/14/2020 02:38 PM   Modules accepted: Orders

## 2020-12-15 ENCOUNTER — Ambulatory Visit
Admission: RE | Admit: 2020-12-15 | Discharge: 2020-12-15 | Disposition: A | Discharge status: Home / Self Care | Payer: Medicare Other | Source: Ambulatory Visit | Attending: Internal Medicine | Admitting: Internal Medicine

## 2020-12-15 ENCOUNTER — Other Ambulatory Visit: Payer: Self-pay | Admitting: Internal Medicine

## 2020-12-15 DIAGNOSIS — R059 Cough, unspecified: Secondary | ICD-10-CM

## 2020-12-31 ENCOUNTER — Ambulatory Visit (HOSPITAL_BASED_OUTPATIENT_CLINIC_OR_DEPARTMENT_OTHER): Payer: Medicare Other | Admitting: Family

## 2021-01-07 ENCOUNTER — Ambulatory Visit (INDEPENDENT_AMBULATORY_CARE_PROVIDER_SITE_OTHER): Payer: Medicare Other | Admitting: Podiatry

## 2021-01-07 ENCOUNTER — Encounter: Payer: Self-pay | Admitting: Podiatry

## 2021-01-07 ENCOUNTER — Other Ambulatory Visit: Payer: Self-pay

## 2021-01-07 DIAGNOSIS — M2041 Other hammer toe(s) (acquired), right foot: Secondary | ICD-10-CM | POA: Diagnosis not present

## 2021-01-07 DIAGNOSIS — M79676 Pain in unspecified toe(s): Secondary | ICD-10-CM

## 2021-01-07 DIAGNOSIS — B351 Tinea unguium: Secondary | ICD-10-CM | POA: Diagnosis not present

## 2021-01-07 DIAGNOSIS — E1151 Type 2 diabetes mellitus with diabetic peripheral angiopathy without gangrene: Secondary | ICD-10-CM

## 2021-01-07 NOTE — Progress Notes (Signed)
This patient returns to my office for at risk foot care.  This patient requires this care by a professional since this patient will be at risk due to having  Type 2 diabetes.    This patient is unable to cut nails herself since the patient cannot reach her nails.These nails are painful walking and wearing shoes.  This patient presents for at risk foot care today.  General Appearance  Alert, conversant and in no acute stress.  Vascular  Dorsalis pedis  pulses are palpable  bilaterally. Posterior tibial pulses are absent  B/L.  Capillary return is within normal limits  bilaterally. Cold feet   Bilaterally. Absent digital hair.  Neurologic  Senn-Weinstein monofilament wire test within normal limits  bilaterally. Muscle power within normal limits bilaterally.  Nails Thick disfigured discolored nails with subungual debris  from hallux to fifth toes bilaterally. No evidence of bacterial infection or drainage bilaterally.  Orthopedic  No limitations of motion  feet .  No crepitus or effusions noted.  HAV  B/L.  Hammer toe second right.  Skin  normotropic skin with no porokeratosis noted bilaterally.  No signs of infections or ulcers noted.   HD second right symptomatic.  Onychomycosis  Pain in right toes  Pain in left toes  HD 2nd toe right  Consent was obtained for treatment procedures.   Mechanical debridement of nails 1-5  bilaterally performed with a nail nipper.  Filed with dremel without incident.    Return office visit  12 weeks                   Told patient to return for periodic foot care and evaluation due to potential at risk complications.   Gardiner Barefoot DPM

## 2021-02-01 ENCOUNTER — Ambulatory Visit (HOSPITAL_BASED_OUTPATIENT_CLINIC_OR_DEPARTMENT_OTHER): Payer: Medicare Other | Admitting: Family

## 2021-02-02 ENCOUNTER — Inpatient Hospital Stay (HOSPITAL_COMMUNITY)
Admission: EM | Admit: 2021-02-02 | Discharge: 2021-02-08 | DRG: 175 | Disposition: A | Discharge status: Hospice | Payer: Medicare Other | Source: Ambulatory Visit | Attending: Family Medicine | Admitting: Family Medicine

## 2021-02-02 ENCOUNTER — Emergency Department (HOSPITAL_COMMUNITY): Payer: Medicare Other

## 2021-02-02 ENCOUNTER — Other Ambulatory Visit: Payer: Self-pay

## 2021-02-02 ENCOUNTER — Encounter (HOSPITAL_COMMUNITY): Payer: Self-pay

## 2021-02-02 DIAGNOSIS — Z7982 Long term (current) use of aspirin: Secondary | ICD-10-CM

## 2021-02-02 DIAGNOSIS — R042 Hemoptysis: Secondary | ICD-10-CM | POA: Diagnosis present

## 2021-02-02 DIAGNOSIS — R918 Other nonspecific abnormal finding of lung field: Secondary | ICD-10-CM | POA: Diagnosis not present

## 2021-02-02 DIAGNOSIS — I10 Essential (primary) hypertension: Secondary | ICD-10-CM | POA: Diagnosis present

## 2021-02-02 DIAGNOSIS — Z79899 Other long term (current) drug therapy: Secondary | ICD-10-CM

## 2021-02-02 DIAGNOSIS — Z8249 Family history of ischemic heart disease and other diseases of the circulatory system: Secondary | ICD-10-CM

## 2021-02-02 DIAGNOSIS — E873 Alkalosis: Secondary | ICD-10-CM | POA: Diagnosis not present

## 2021-02-02 DIAGNOSIS — F05 Delirium due to known physiological condition: Secondary | ICD-10-CM | POA: Diagnosis present

## 2021-02-02 DIAGNOSIS — J90 Pleural effusion, not elsewhere classified: Secondary | ICD-10-CM

## 2021-02-02 DIAGNOSIS — E119 Type 2 diabetes mellitus without complications: Secondary | ICD-10-CM | POA: Diagnosis present

## 2021-02-02 DIAGNOSIS — Z7984 Long term (current) use of oral hypoglycemic drugs: Secondary | ICD-10-CM

## 2021-02-02 DIAGNOSIS — I083 Combined rheumatic disorders of mitral, aortic and tricuspid valves: Secondary | ICD-10-CM | POA: Diagnosis present

## 2021-02-02 DIAGNOSIS — I82442 Acute embolism and thrombosis of left tibial vein: Secondary | ICD-10-CM | POA: Diagnosis present

## 2021-02-02 DIAGNOSIS — I81 Portal vein thrombosis: Secondary | ICD-10-CM | POA: Diagnosis present

## 2021-02-02 DIAGNOSIS — Z794 Long term (current) use of insulin: Secondary | ICD-10-CM

## 2021-02-02 DIAGNOSIS — I2699 Other pulmonary embolism without acute cor pulmonale: Principal | ICD-10-CM

## 2021-02-02 DIAGNOSIS — J189 Pneumonia, unspecified organism: Secondary | ICD-10-CM | POA: Diagnosis present

## 2021-02-02 DIAGNOSIS — E785 Hyperlipidemia, unspecified: Secondary | ICD-10-CM | POA: Diagnosis present

## 2021-02-02 DIAGNOSIS — C787 Secondary malignant neoplasm of liver and intrahepatic bile duct: Secondary | ICD-10-CM | POA: Diagnosis present

## 2021-02-02 DIAGNOSIS — Z515 Encounter for palliative care: Secondary | ICD-10-CM | POA: Diagnosis not present

## 2021-02-02 DIAGNOSIS — E039 Hypothyroidism, unspecified: Secondary | ICD-10-CM | POA: Diagnosis present

## 2021-02-02 DIAGNOSIS — Z7189 Other specified counseling: Secondary | ICD-10-CM

## 2021-02-02 DIAGNOSIS — K55069 Acute infarction of intestine, part and extent unspecified: Secondary | ICD-10-CM | POA: Diagnosis present

## 2021-02-02 DIAGNOSIS — R0600 Dyspnea, unspecified: Secondary | ICD-10-CM

## 2021-02-02 DIAGNOSIS — Z951 Presence of aortocoronary bypass graft: Secondary | ICD-10-CM

## 2021-02-02 DIAGNOSIS — Z952 Presence of prosthetic heart valve: Secondary | ICD-10-CM

## 2021-02-02 DIAGNOSIS — Z20822 Contact with and (suspected) exposure to covid-19: Secondary | ICD-10-CM | POA: Diagnosis present

## 2021-02-02 DIAGNOSIS — C259 Malignant neoplasm of pancreas, unspecified: Secondary | ICD-10-CM | POA: Diagnosis present

## 2021-02-02 DIAGNOSIS — G9341 Metabolic encephalopathy: Secondary | ICD-10-CM | POA: Diagnosis present

## 2021-02-02 DIAGNOSIS — Z79890 Hormone replacement therapy (postmenopausal): Secondary | ICD-10-CM

## 2021-02-02 DIAGNOSIS — C7800 Secondary malignant neoplasm of unspecified lung: Secondary | ICD-10-CM | POA: Diagnosis not present

## 2021-02-02 DIAGNOSIS — J9601 Acute respiratory failure with hypoxia: Secondary | ICD-10-CM

## 2021-02-02 DIAGNOSIS — Z66 Do not resuscitate: Secondary | ICD-10-CM | POA: Diagnosis not present

## 2021-02-02 DIAGNOSIS — R8281 Pyuria: Secondary | ICD-10-CM | POA: Diagnosis present

## 2021-02-02 DIAGNOSIS — Z7901 Long term (current) use of anticoagulants: Secondary | ICD-10-CM | POA: Diagnosis not present

## 2021-02-02 DIAGNOSIS — R0602 Shortness of breath: Secondary | ICD-10-CM

## 2021-02-02 DIAGNOSIS — R0609 Other forms of dyspnea: Secondary | ICD-10-CM | POA: Diagnosis not present

## 2021-02-02 LAB — TYPE AND SCREEN
ABO/RH(D): A POS
Antibody Screen: NEGATIVE

## 2021-02-02 LAB — I-STAT ARTERIAL BLOOD GAS, ED
Acid-Base Excess: 6 mmol/L — ABNORMAL HIGH (ref 0.0–2.0)
Bicarbonate: 31.5 mmol/L — ABNORMAL HIGH (ref 20.0–28.0)
Calcium, Ion: 1.2 mmol/L (ref 1.15–1.40)
HCT: 31 % — ABNORMAL LOW (ref 36.0–46.0)
Hemoglobin: 10.5 g/dL — ABNORMAL LOW (ref 12.0–15.0)
O2 Saturation: 91 %
Patient temperature: 98.7
Potassium: 3 mmol/L — ABNORMAL LOW (ref 3.5–5.1)
Sodium: 138 mmol/L (ref 135–145)
TCO2: 33 mmol/L — ABNORMAL HIGH (ref 22–32)
pCO2 arterial: 47.1 mmHg (ref 32.0–48.0)
pH, Arterial: 7.434 (ref 7.350–7.450)
pO2, Arterial: 61 mmHg — ABNORMAL LOW (ref 83.0–108.0)

## 2021-02-02 LAB — CBC WITH DIFFERENTIAL/PLATELET
Abs Immature Granulocytes: 0.04 10*3/uL (ref 0.00–0.07)
Basophils Absolute: 0.1 10*3/uL (ref 0.0–0.1)
Basophils Relative: 1 %
Eosinophils Absolute: 0.3 10*3/uL (ref 0.0–0.5)
Eosinophils Relative: 3 %
HCT: 30.9 % — ABNORMAL LOW (ref 36.0–46.0)
Hemoglobin: 10 g/dL — ABNORMAL LOW (ref 12.0–15.0)
Immature Granulocytes: 0 %
Lymphocytes Relative: 19 %
Lymphs Abs: 1.9 10*3/uL (ref 0.7–4.0)
MCH: 29.6 pg (ref 26.0–34.0)
MCHC: 32.4 g/dL (ref 30.0–36.0)
MCV: 91.4 fL (ref 80.0–100.0)
Monocytes Absolute: 0.9 10*3/uL (ref 0.1–1.0)
Monocytes Relative: 9 %
Neutro Abs: 6.6 10*3/uL (ref 1.7–7.7)
Neutrophils Relative %: 68 %
Platelets: 216 10*3/uL (ref 150–400)
RBC: 3.38 MIL/uL — ABNORMAL LOW (ref 3.87–5.11)
RDW: 17.4 % — ABNORMAL HIGH (ref 11.5–15.5)
WBC: 9.8 10*3/uL (ref 4.0–10.5)
nRBC: 0 % (ref 0.0–0.2)

## 2021-02-02 LAB — COMPREHENSIVE METABOLIC PANEL
ALT: 32 U/L (ref 0–44)
AST: 53 U/L — ABNORMAL HIGH (ref 15–41)
Albumin: 2.5 g/dL — ABNORMAL LOW (ref 3.5–5.0)
Alkaline Phosphatase: 826 U/L — ABNORMAL HIGH (ref 38–126)
Anion gap: 11 (ref 5–15)
BUN: 16 mg/dL (ref 8–23)
CO2: 29 mmol/L (ref 22–32)
Calcium: 9 mg/dL (ref 8.9–10.3)
Chloride: 97 mmol/L — ABNORMAL LOW (ref 98–111)
Creatinine, Ser: 1.11 mg/dL — ABNORMAL HIGH (ref 0.44–1.00)
GFR, Estimated: 49 mL/min — ABNORMAL LOW (ref >60)
Glucose, Bld: 181 mg/dL — ABNORMAL HIGH (ref 70–99)
Potassium: 3.2 mmol/L — ABNORMAL LOW (ref 3.5–5.1)
Sodium: 137 mmol/L (ref 135–145)
Total Bilirubin: 0.5 mg/dL (ref 0.3–1.2)
Total Protein: 7 g/dL (ref 6.5–8.1)

## 2021-02-02 LAB — TROPONIN I (HIGH SENSITIVITY): Troponin I (High Sensitivity): 37 ng/L — ABNORMAL HIGH (ref <18)

## 2021-02-02 LAB — RESP PANEL BY RT-PCR (FLU A&B, COVID) ARPGX2
Influenza A by PCR: NEGATIVE
Influenza B by PCR: NEGATIVE
SARS Coronavirus 2 by RT PCR: NEGATIVE

## 2021-02-02 LAB — BRAIN NATRIURETIC PEPTIDE: B Natriuretic Peptide: 351.8 pg/mL — ABNORMAL HIGH (ref 0.0–100.0)

## 2021-02-02 LAB — D-DIMER, QUANTITATIVE: D-Dimer, Quant: 4.87 ug/mL-FEU — ABNORMAL HIGH (ref 0.00–0.50)

## 2021-02-02 LAB — PROTIME-INR
INR: 1.2 (ref 0.8–1.2)
Prothrombin Time: 15.3 seconds — ABNORMAL HIGH (ref 11.4–15.2)

## 2021-02-02 MED ORDER — INSULIN ASPART 100 UNIT/ML IJ SOLN: 0.0000 [IU] | Freq: Every day | INTRAMUSCULAR | Status: DC

## 2021-02-02 MED ORDER — AZITHROMYCIN 250 MG PO TABS
500.0000 mg | ORAL_TABLET | Freq: Every day | ORAL | Status: DC
  Administered 2021-02-03 – 2021-02-07 (×5): 500 mg via ORAL
  Filled 2021-02-02 (×6): qty 2

## 2021-02-02 MED ORDER — ACETAMINOPHEN 650 MG RE SUPP: 650.0000 mg | Freq: Four times a day (QID) | RECTAL | Status: DC | PRN

## 2021-02-02 MED ORDER — SODIUM CHLORIDE 0.9 % IV SOLN
2.0000 g | INTRAVENOUS | Status: DC
  Administered 2021-02-03 – 2021-02-07 (×5): 2 g via INTRAVENOUS
  Filled 2021-02-02 (×5): qty 20

## 2021-02-02 MED ORDER — HEPARIN (PORCINE) 25000 UT/250ML-% IV SOLN
1200.0000 [IU]/h | INTRAVENOUS | Status: DC
  Administered 2021-02-02: 1200 [IU]/h via INTRAVENOUS
  Filled 2021-02-02: qty 250

## 2021-02-02 MED ORDER — POTASSIUM CHLORIDE CRYS ER 20 MEQ PO TBCR
40.0000 meq | EXTENDED_RELEASE_TABLET | Freq: Once | ORAL | Status: AC
  Administered 2021-02-02: 40 meq via ORAL
  Filled 2021-02-02: qty 2

## 2021-02-02 MED ORDER — SODIUM CHLORIDE 0.9 % IV SOLN
2.0000 g | Freq: Once | INTRAVENOUS | Status: AC
  Administered 2021-02-02: 2 g via INTRAVENOUS
  Filled 2021-02-02: qty 2

## 2021-02-02 MED ORDER — VANCOMYCIN HCL 1500 MG/300ML IV SOLN
1500.0000 mg | Freq: Once | INTRAVENOUS | Status: AC
  Administered 2021-02-02: 1500 mg via INTRAVENOUS
  Filled 2021-02-02: qty 300

## 2021-02-02 MED ORDER — IOHEXOL 350 MG/ML SOLN
65.0000 mL | Freq: Once | INTRAVENOUS | Status: AC | PRN
  Administered 2021-02-02: 65 mL via INTRAVENOUS

## 2021-02-02 MED ORDER — HEPARIN BOLUS VIA INFUSION
4000.0000 [IU] | Freq: Once | INTRAVENOUS | Status: AC
  Administered 2021-02-02: 4000 [IU] via INTRAVENOUS
  Filled 2021-02-02: qty 4000

## 2021-02-02 MED ORDER — INSULIN GLARGINE-YFGN 100 UNIT/ML ~~LOC~~ SOLN
10.0000 [IU] | Freq: Every day | SUBCUTANEOUS | Status: DC
  Administered 2021-02-04 – 2021-02-08 (×5): 10 [IU] via SUBCUTANEOUS
  Filled 2021-02-02 (×8): qty 0.1

## 2021-02-02 MED ORDER — ONDANSETRON HCL 4 MG/2ML IJ SOLN: 4.0000 mg | Freq: Four times a day (QID) | INTRAMUSCULAR | Status: DC | PRN

## 2021-02-02 MED ORDER — ONDANSETRON HCL 4 MG PO TABS: 4.0000 mg | ORAL_TABLET | Freq: Four times a day (QID) | ORAL | Status: DC | PRN

## 2021-02-02 MED ORDER — ACETAMINOPHEN 325 MG PO TABS: 650.0000 mg | ORAL_TABLET | Freq: Four times a day (QID) | ORAL | Status: DC | PRN

## 2021-02-02 MED ORDER — INSULIN ASPART 100 UNIT/ML IJ SOLN
0.0000 [IU] | Freq: Three times a day (TID) | INTRAMUSCULAR | Status: DC
  Administered 2021-02-03: 5 [IU] via SUBCUTANEOUS
  Administered 2021-02-03 – 2021-02-05 (×3): 3 [IU] via SUBCUTANEOUS
  Administered 2021-02-05: 2 [IU] via SUBCUTANEOUS
  Administered 2021-02-06: 3 [IU] via SUBCUTANEOUS
  Administered 2021-02-07: 5 [IU] via SUBCUTANEOUS
  Administered 2021-02-07: 3 [IU] via SUBCUTANEOUS
  Administered 2021-02-08 (×2): 5 [IU] via SUBCUTANEOUS

## 2021-02-02 NOTE — Assessment & Plan Note (Signed)
Stable

## 2021-02-02 NOTE — Assessment & Plan Note (Signed)
Place on SSI. Continue with lantus.

## 2021-02-02 NOTE — Assessment & Plan Note (Signed)
Patient had pleural effusion the right.  May benefit from thoracentesis.  If this shows malignancy, then patient may not need bronchoscopy with biopsy.

## 2021-02-02 NOTE — ED Provider Notes (Signed)
[facility] EMERGENCY DEPARTMENT Provider Note   CSN: [id number] Arrival date & time: 02/02/21  1728     History Chief Complaint  Patient presents with   Shortness of Breath   Weakness    [name] is a 87 y.o. female.  87 year old female with prior medical history as detailed below presents for evaluation.  Patient arrives by EMS from her PCPs office.  Patient reports that she presented to her PCP for evaluation of shortness of breath.  Patient reports ongoing dyspnea over the last 2 to 3 weeks.  PCP noted that the patient was hypoxic on room air into the mid 80s.  With ambulation the PCP reported that the patient's O2 sats dropped into the 60s.  EMS applied supplemental O2 and transported the patient to the ED.  Upon arrival to the ED she is breathing comfortably.  She denies associated fever, chest pain, nausea, vomiting, or other specific complaint.  She reports chronic cough which is dry.  She denies prior known history of lung cancer or other malignancy.  The history is provided by the patient.  Shortness of Breath Severity:  Moderate Onset quality:  Gradual Duration:  3 weeks Timing:  Constant Progression:  Worsening Chronicity:  New Context: activity   Relieved by:  Rest Worsened by:  Exertion Weakness Associated symptoms: shortness of breath       Past Medical History:  Diagnosis Date   Aortic valve disorders    S/P AVR 06/2012   Arthritis    Asthma    AS CHILD    Depression    Diabetes mellitus    Heart murmur    Hyperlipidemia    Hypertension    Hypothyroidism    Ischemic heart disease    with prior CABG x 2 in 2014 along with AVR   Lumbago    Shortness of breath    Thyroid disease     Patient Active Problem List   Diagnosis Date Noted   Overactive bladder 10/27/2015   S/P CABG (coronary artery bypass graft) 07/16/2012   Type II diabetes mellitus, uncontrolled 06/17/2012   Essential hypertension 05/20/2012    Hyperlipidemia 05/20/2012   Hypothyroidism 05/20/2012   Aortic stenosis 05/10/2011    Past Surgical History:  Procedure Laterality Date   AORTIC VALVE REPLACEMENT N/A 06/25/2012   Procedure: AORTIC VALVE REPLACEMENT (AVR);  Surgeon: Grace Isaac, MD;  Location: [place];  Service: Open Heart Surgery;  Laterality: N/A;   BREAST BIOPSY Right 08/2016   benign   BREAST SURGERY     LT BREAST BX BENIGN   CORONARY ARTERY BYPASS GRAFT N/A 06/25/2012   Procedure: Coronary artery bypass graft  times two using left internal mammary artery and right leg greater saphenous vein;  Surgeon: Grace Isaac, MD;  Location: [place];  Service: Open Heart Surgery;  Laterality: N/A;   INTRAOPERATIVE TRANSESOPHAGEAL ECHOCARDIOGRAM N/A 06/25/2012   Procedure: INTRAOPERATIVE TRANSESOPHAGEAL ECHOCARDIOGRAM;  Surgeon: Grace Isaac, MD;  Location: [place];  Service: Open Heart Surgery;  Laterality: N/A;   TUBAL LIGATION       OB History   No obstetric history on file.     Family History  Problem Relation Age of Onset   Heart disease Father     Social History   Tobacco Use   Smoking status: Never   Smokeless tobacco: Never  Vaping Use   Vaping Use: Never used  Substance Use Topics   Alcohol use: No   Drug use:  No    Home Medications Prior to Admission medications   Medication Sig Start Date End Date Taking? Authorizing Provider  Accu-Chek FastClix Lancets MISC Use Accu Chek Fastclix to check blood sugar 4 times daily. 02/13/19   Elayne Snare, MD  aspirin EC 81 MG tablet Take 1 tablet (81 mg total) by mouth daily. 09/10/13   Nahser, Wonda Cheng, MD  calcium carbonate (TUMS - DOSED IN MG ELEMENTAL CALCIUM) 500 MG chewable tablet Chew 1 tablet by mouth daily as needed for heartburn (indigestion).    [provider]  carvedilol (COREG) 12.5 MG tablet Take 1 tablet (12.5 mg total) by mouth 2 (two) times daily. 10/07/20   Freada Bergeron, MD  cholecalciferol (VITAMIN D) 1000 UNITS tablet Take  1,000 Units by mouth daily.    [provider]  Coenzyme Q10 (CO Q-10) 100 MG CAPS Take 1 capsule by mouth 2 (two) times daily.    [provider]  Empagliflozin-metFORMIN HCl (SYNJARDY) 5-500 MG TABS Take 1 tablet by mouth 2 (two) times daily. Takes occassionally 11/10/20   Elayne Snare, MD  furosemide (LASIX) 20 MG tablet Take 1 tablet (20 mg total) by mouth daily as needed for fluid or edema (or weight gain). 05/20/19   Burtis Junes, NP  HUMALOG KWIKPEN 100 UNIT/ML KwikPen INJECT 6 TO 8 UNITS INTO THE SKIN TWICE DAILY 04/14/19   Elayne Snare, MD  Insulin Pen Needle (B-D UF III MINI PEN NEEDLES) 31G X 5 MM MISC USE TO INJECT MEDICATION THREE TIMES DAILY 12/14/20   Elayne Snare, MD  levothyroxine (SYNTHROID) 75 MCG tablet TAKE 1 TABLET BY MOUTH EVERY DAY 07/01/20   Elayne Snare, MD  losartan (COZAAR) 50 MG tablet Take 50 mg by mouth daily. 09/28/20   [provider]  memantine (NAMENDA) 5 MG tablet Take 5 mg by mouth at bedtime. 01/27/20   [provider]  metFORMIN (GLUCOPHAGE-XR) 750 MG 24 hr tablet TAKE 1 TABLET BY MOUTH EVERY DAY 10/11/20   Elayne Snare, MD  Multiple Vitamins-Minerals (MULTIVITAMINS THER. W/MINERALS) TABS Take 1 tablet by mouth daily.    [provider]  ONETOUCH VERIO test strip USE TO CHECK BLOOD SUGAR FOUR TIMES DAILY AS DIRECTED 03/08/20   Elayne Snare, MD  potassium chloride SA (KLOR-CON) 20 MEQ tablet Take 1 tablet (20 mEq total) by mouth 2 (two) times daily. 12/14/20   Elayne Snare, MD  rosuvastatin (CRESTOR) 20 MG tablet TAKE 1 TABLET BY MOUTH EVERY DAY 09/01/20   Elayne Snare, MD  TOUJEO SOLOSTAR 300 UNIT/ML Solostar Pen ADMINISTER 22 UNITS UNDER THE SKIN DAILY 10/22/20   Elayne Snare, MD  vitamin B-12 (CYANOCOBALAMIN) 100 MCG tablet Take 100 mcg by mouth daily.    [provider]    Allergies    Patient has no known allergies.  Review of Systems   Review of Systems  Respiratory:  Positive for shortness of breath.    Neurological:  Positive for weakness.  All other systems reviewed and are negative.  Physical Exam Updated Vital Signs BP (!) 141/70 (BP Location: Right Arm)   Pulse 68   Temp 98.7 F (37.1 C) (Oral)   Resp (!) 22   Ht 5' 3.5" (1.613 m)   Wt 80.3 kg   SpO2 98%   BMI 30.86 kg/m   Physical Exam Vitals and nursing note reviewed.  Constitutional:      General: She is not in acute distress.    Appearance: Normal appearance. She is well-developed.  HENT:  Head: Normocephalic and atraumatic.  Eyes:     Conjunctiva/sclera: Conjunctivae normal.     Pupils: Pupils are equal, round, and reactive to light.  Cardiovascular:     Rate and Rhythm: Normal rate and regular rhythm.     Heart sounds: Normal heart sounds.  Pulmonary:     Effort: Pulmonary effort is normal. No respiratory distress.     Breath sounds: Normal breath sounds.  Abdominal:     General: There is no distension.     Palpations: Abdomen is soft.     Tenderness: There is no abdominal tenderness.  Musculoskeletal:        General: No deformity. Normal range of motion.     Cervical back: Normal range of motion and neck supple.     Right lower leg: Edema present.     Left lower leg: Edema present.     Comments: 1-2 bilateral LE edema   Skin:    General: Skin is warm and dry.  Neurological:     General: No focal deficit present.     Mental Status: She is alert and oriented to person, place, and time.    ED Results / Procedures / Treatments   Labs (all labs ordered are listed, but only abnormal results are displayed) Labs Reviewed  CULTURE, BLOOD (ROUTINE X 2)  CULTURE, BLOOD (ROUTINE X 2)  RESP PANEL BY RT-PCR (FLU A&B, COVID) ARPGX2  BLOOD GAS, ARTERIAL  COMPREHENSIVE METABOLIC PANEL  BRAIN NATRIURETIC PEPTIDE  CBC WITH DIFFERENTIAL/PLATELET  PROTIME-INR  D-DIMER, QUANTITATIVE  URINALYSIS, ROUTINE W REFLEX MICROSCOPIC  TYPE AND SCREEN  TROPONIN I (HIGH SENSITIVITY)    EKG EKG  Interpretation  Date/Time:  Wednesday February 02 2021 17:40:38 EDT Ventricular Rate:  69 PR Interval:  163 QRS Duration: 96 QT Interval:  426 QTC Calculation: 457 R Axis:   -32 Text Interpretation: Sinus rhythm Left atrial enlargement LVH with secondary repolarization abnormality Anterior infarct, old Confirmed by Dene Gentry [phone]) on 02/02/2021 5:43:09 PM  Radiology No results found.  Procedures Procedures   Medications Ordered in ED Medications - No data to display  ED Course  I have reviewed the triage vital signs and the nursing notes.  Pertinent labs & imaging results that were available during my care of the patient were reviewed by me and considered in my medical decision making (see chart for details).    MDM Rules/Calculators/A&P                           MDM  MSE complete  [name] was evaluated in Emergency Department on 02/02/2021 for the symptoms described in the history of present illness. She was evaluated in the context of the global COVID-19 pandemic, which necessitated consideration that the patient might be at risk for infection with the SARS-CoV-2 virus that causes COVID-19. Institutional protocols and algorithms that pertain to the evaluation of patients at risk for COVID-19 are in a state of rapid change based on information released by regulatory bodies including the CDC and federal and state organizations. These policies and algorithms were followed during the patient's care in the ED.   Patient is presenting with significant hypoxia.  Work-up is concerning given patient's clearly abnormal chest x-ray.  CT angio of the chest reveals PE in the left upper lobe.  Pneumonia is a distinct possibility per radiology.  Malignancy cannot be excluded.  Patient requires additional work-up in the inpatient setting.  Hospitalist service is aware  of case and will evaluate for admission.    Final Clinical Impression(s) / ED Diagnoses Final  diagnoses:  Dyspnea, unspecified type    Rx / DC Orders ED Discharge Orders     None        Valarie Merino, MD 02/02/21 2152

## 2021-02-02 NOTE — Assessment & Plan Note (Signed)
Continue supplemental O2. Pt does not use home O2

## 2021-02-02 NOTE — Progress Notes (Addendum)
Pharmacy Antibiotic Note  [name] is a 87 y.o. female admitted on 02/02/2021 with pneumonia.  Pharmacy has been consulted for vancomycin dosing.  Patient presented for shortness of breath and weakness. Afebrile with WBC 9.8 and SCr 1.11  Plan: Start vancomycin 1500 mg IV once Follow with vancomycin 1250 mg IV Q 48 hours      eAUC 512.1, Cmin 9.9 using SCr 1.11 and Vd 0.5 Monitor renal function, cultures, and clinical improvement  Height: 5' 3.5" (161.3 cm) Weight: 80.3 kg (177 lb) IBW/kg (Calculated) : 53.55  Temp (24hrs), Avg:98.7 F (37.1 C), Min:98.7 F (37.1 C), Max:98.7 F (37.1 C)  Recent Labs  Lab 02/02/21 1746 02/02/21 1920  WBC 9.8  --   CREATININE  --  1.11*    Estimated Creatinine Clearance: 37.6 mL/min (A) (by C-G formula based on SCr of 1.11 mg/dL (H)).    No Known Allergies  Antimicrobials this admission: Cefepime x1 dose 10/5 Vancomycin 10/5>>  Microbiology results: 10/5 BCx: in process  Thank you for allowing pharmacy to be a part of this patient's care.  Donald Pore 02/02/2021 9:32 PM

## 2021-02-02 NOTE — H&P (Signed)
History and Physical    [name] RJJ:[id number] DOB: [birth year] DOA: 02/02/2021  PCP: Rogers Blocker, MD   Patient coming from: Home  I have personally briefly reviewed patient's old medical records in [place]  CC: hypoxia HPI: Very sweet 87 year old African-American female with a history of hypertension, type 2 diabetes on insulin, hyperlipidemia, acquired hypothyroidism presents to the ER today for the PCP office.  Patient noted to be hypoxic in the office.  Patient arrived with room air saturations of 85%.  With ambulation her sats dropped to 60%.  Patient states that over the last 3 weeks she has noticed increasing dyspnea and cough.  She took some cough medicine.  She thought that she was initially getting better.  Over the last 2 weeks, she has felt worse.  She is bringing up clear-colored sputum.  She is only had 1 episode that was mildly blood-tinged.  She is increasingly dyspneic with just minimal exertion.  Patient is notes approximately 40-50 pound weight loss over the last 3 weeks.  She states she mentioned this to her PCP.  In the ER, patient noted to be on admission.  Patient placed initially on nonrebreather and then decreased to 2 L a minute.  Laboratory work-up demonstrated BNP of 351.  D-dimer was positive for 0.87.  Chemistry showed a sodium 137 potassium 3.2 chloride of 29 bicarbonate of 16 been of 1.1 glucose of 181.  CBC showed a white count of 9.8, hemoglobin 10.0 platelets of 216.  ABG on 2 L oxygen showed pH 7.43 PCO2 47 PO2 of 61.  Patient's COVID-negative.  CTPA demonstrated left upper lobe pulmonary embolism.  There are multiple pulmonary nodules that were also seen on her chest x-ray and August 2022 and May 2022.  She had mediastinal adenopathy.  There is concern for metastatic disease.  She has a moderate sized right-sided pleural effusion.  Patient lives in an apartment with her 68 year old sister.  Due to the patient's pulmonary embolism,  acute hypoxic respiratory failure, concern for metastatic lung disease, [facility] hospitalist contacted for admission.   ED Course: hypoxic on arrival to ER. Placed on supplemental O2. CTPA showed PE and multiple pulmonary nodules. Concern for metastatic lung disease.  Review of Systems:  Review of Systems  Constitutional:  Positive for chills, fever, malaise/fatigue and weight loss.       40-50 lbs weight loss in 3 weeks  HENT: Negative.    Eyes: Negative.   Respiratory:  Positive for cough, sputum production and shortness of breath. Negative for wheezing.   Cardiovascular: Negative.   Gastrointestinal: Negative.   Genitourinary: Negative.   Musculoskeletal: Negative.   Skin: Negative.   Neurological: Negative.   Endo/Heme/Allergies: Negative.   Psychiatric/Behavioral: Negative.    All other systems reviewed and are negative.  Past Medical History:  Diagnosis Date   Aortic valve disorders    S/P AVR 06/2012   Arthritis    Asthma    AS CHILD    Depression    Diabetes mellitus    Heart murmur    Hyperlipidemia    Hypertension    Hypothyroidism    Ischemic heart disease    with prior CABG x 2 in 2014 along with AVR   Lumbago    Shortness of breath    Thyroid disease     Past Surgical History:  Procedure Laterality Date   AORTIC VALVE REPLACEMENT N/A 06/25/2012   Procedure: AORTIC VALVE REPLACEMENT (AVR);  Surgeon: Grace Isaac, MD;  Location: [facility] OR;  Service: Open Heart Surgery;  Laterality: N/A;   BREAST BIOPSY Right 08/2016   benign   BREAST SURGERY     LT BREAST BX BENIGN   CORONARY ARTERY BYPASS GRAFT N/A 06/25/2012   Procedure: Coronary artery bypass graft  times two using left internal mammary artery and right leg greater saphenous vein;  Surgeon: Grace Isaac, MD;  Location: [place];  Service: Open Heart Surgery;  Laterality: N/A;   INTRAOPERATIVE TRANSESOPHAGEAL ECHOCARDIOGRAM N/A 06/25/2012   Procedure: INTRAOPERATIVE TRANSESOPHAGEAL ECHOCARDIOGRAM;   Surgeon: Grace Isaac, MD;  Location: [place];  Service: Open Heart Surgery;  Laterality: N/A;   TUBAL LIGATION       reports that she has never smoked. She has never used smokeless tobacco. She reports that she does not drink alcohol and does not use drugs.  No Known Allergies  Family History  Problem Relation Age of Onset   Heart disease Father     Prior to Admission medications   Medication Sig Start Date End Date Taking? Authorizing Provider  Accu-Chek FastClix Lancets MISC Use Accu Chek Fastclix to check blood sugar 4 times daily. 02/13/19   Elayne Snare, MD  aspirin EC 81 MG tablet Take 1 tablet (81 mg total) by mouth daily. 09/10/13   Nahser, Wonda Cheng, MD  calcium carbonate (TUMS - DOSED IN MG ELEMENTAL CALCIUM) 500 MG chewable tablet Chew 1 tablet by mouth daily as needed for heartburn (indigestion).    [provider]  carvedilol (COREG) 12.5 MG tablet Take 1 tablet (12.5 mg total) by mouth 2 (two) times daily. 10/07/20   Freada Bergeron, MD  cholecalciferol (VITAMIN D) 1000 UNITS tablet Take 1,000 Units by mouth daily.    [provider]  Coenzyme Q10 (CO Q-10) 100 MG CAPS Take 1 capsule by mouth 2 (two) times daily.    [provider]  Empagliflozin-metFORMIN HCl (SYNJARDY) 5-500 MG TABS Take 1 tablet by mouth 2 (two) times daily. Takes occassionally 11/10/20   Elayne Snare, MD  furosemide (LASIX) 20 MG tablet Take 1 tablet (20 mg total) by mouth daily as needed for fluid or edema (or weight gain). 05/20/19   Burtis Junes, NP  HUMALOG KWIKPEN 100 UNIT/ML KwikPen INJECT 6 TO 8 UNITS INTO THE SKIN TWICE DAILY 04/14/19   Elayne Snare, MD  Insulin Pen Needle (B-D UF III MINI PEN NEEDLES) 31G X 5 MM MISC USE TO INJECT MEDICATION THREE TIMES DAILY 12/14/20   Elayne Snare, MD  levothyroxine (SYNTHROID) 75 MCG tablet TAKE 1 TABLET BY MOUTH EVERY DAY 07/01/20   Elayne Snare, MD  losartan (COZAAR) 50 MG tablet Take 50 mg by mouth daily. 09/28/20   [provider]  memantine (NAMENDA) 5 MG tablet Take 5 mg by mouth at bedtime. 01/27/20   [provider]  metFORMIN (GLUCOPHAGE-XR) 750 MG 24 hr tablet TAKE 1 TABLET BY MOUTH EVERY DAY 10/11/20   Elayne Snare, MD  Multiple Vitamins-Minerals (MULTIVITAMINS THER. W/MINERALS) TABS Take 1 tablet by mouth daily.    [provider]  ONETOUCH VERIO test strip USE TO CHECK BLOOD SUGAR FOUR TIMES DAILY AS DIRECTED 03/08/20   Elayne Snare, MD  potassium chloride SA (KLOR-CON) 20 MEQ tablet Take 1 tablet (20 mEq total) by mouth 2 (two) times daily. 12/14/20   Elayne Snare, MD  rosuvastatin (CRESTOR) 20 MG tablet TAKE 1 TABLET BY MOUTH EVERY DAY 09/01/20   Elayne Snare, MD  TOUJEO SOLOSTAR 300 UNIT/ML Solostar Pen  ADMINISTER 22 UNITS UNDER THE SKIN DAILY 10/22/20   Elayne Snare, MD  vitamin B-12 (CYANOCOBALAMIN) 100 MCG tablet Take 100 mcg by mouth daily.    [provider]    Physical Exam: Vitals:   02/02/21 2030 02/02/21 2045 02/02/21 2145 02/02/21 2200  BP: 138/63 133/70 (!) 136/54 (!) 156/66  Pulse: 73 70 73 81  Resp: 18 (!) 25 (!) 24 (!) 22  Temp:      TempSrc:      SpO2: 93% 94% 94% 95%  Weight:      Height:        Physical Exam Vitals and nursing note reviewed.  Constitutional:      General: She is not in acute distress.    Appearance: She is normal weight. She is not ill-appearing, toxic-appearing or diaphoretic.  HENT:     Head: Normocephalic and atraumatic.     Nose: Nose normal. No rhinorrhea.  Eyes:     General:        Right eye: No discharge.        Left eye: No discharge.     Pupils: Pupils are equal, round, and reactive to light.  Cardiovascular:     Rate and Rhythm: Normal rate and regular rhythm.  Pulmonary:     Effort: No respiratory distress.     Breath sounds: No rhonchi.     Comments: Bilateral rales Chest:     Chest wall: No tenderness.  Abdominal:     General: Abdomen is flat. Bowel sounds are normal. There is no distension.     Palpations:  Abdomen is soft.     Tenderness: There is no guarding or rebound.  Musculoskeletal:     Right lower leg: No edema.     Left lower leg: No edema.  Skin:    General: Skin is warm and dry.     Capillary Refill: Capillary refill takes less than 2 seconds.  Neurological:     General: No focal deficit present.     Mental Status: She is alert and oriented to person, place, and time.     Labs on Admission: I have personally reviewed following labs and imaging studies  CBC: Recent Labs  Lab 02/02/21 1746 02/02/21 1806  WBC 9.8  --   NEUTROABS 6.6  --   HGB 10.0* 10.5*  HCT 30.9* 31.0*  MCV 91.4  --   PLT 216  --    Basic Metabolic Panel: Recent Labs  Lab 02/02/21 1806 02/02/21 1920  NA 138 137  K 3.0* 3.2*  CL  --  97*  CO2  --  29  GLUCOSE  --  181*  BUN  --  16  CREATININE  --  1.11*  CALCIUM  --  9.0   GFR: Estimated Creatinine Clearance: 37.6 mL/min (A) (by C-G formula based on SCr of 1.11 mg/dL (H)). Liver Function Tests: Recent Labs  Lab 02/02/21 1920  AST 53*  ALT 32  ALKPHOS 826*  BILITOT 0.5  PROT 7.0  ALBUMIN 2.5*   No results for input(s): LIPASE, AMYLASE in the last 168 hours. No results for input(s): AMMONIA in the last 168 hours. Coagulation Profile: Recent Labs  Lab 02/02/21 1920  INR 1.2   Cardiac Enzymes: No results for input(s): CKTOTAL, CKMB, CKMBINDEX, TROPONINI in the last 168 hours. BNP (last 3 results) No results for input(s): PROBNP in the last 8760 hours. HbA1C: No results for input(s): HGBA1C in the last 72 hours. CBG: No results for input(s):  GLUCAP in the last 168 hours. Lipid Profile: No results for input(s): CHOL, HDL, LDLCALC, TRIG, CHOLHDL, LDLDIRECT in the last 72 hours. Thyroid Function Tests: No results for input(s): TSH, T4TOTAL, FREET4, T3FREE, THYROIDAB in the last 72 hours. Anemia Panel: No results for input(s): VITAMINB12, FOLATE, FERRITIN, TIBC, IRON, RETICCTPCT in the last 72 hours. Urine analysis:     Component Value Date/Time   COLORURINE YELLOW 12/14/2020 1006   APPEARANCEUR Cloudy (A) 12/14/2020 1006   LABSPEC 1.025 12/14/2020 1006   PHURINE 6.0 12/14/2020 1006   GLUCOSEU NEGATIVE 12/14/2020 1006   HGBUR SMALL (A) 12/14/2020 1006   BILIRUBINUR NEGATIVE 12/14/2020 1006   KETONESUR NEGATIVE 12/14/2020 1006   PROTEINUR NEGATIVE 06/21/2012 1350   UROBILINOGEN 1.0 12/14/2020 1006   NITRITE POSITIVE (A) 12/14/2020 1006   LEUKOCYTESUR MODERATE (A) 12/14/2020 1006    Radiological Exams on Admission: I have personally reviewed images CT Angio Chest PE W and/or Wo Contrast  Addendum Date: 02/02/2021   ADDENDUM REPORT: 02/02/2021 21:29 ADDENDUM: Given the longstanding nature of the nodular opacities in both lungs the possibility of metastatic disease would deserve consideration although felt to be less likely. Critical Value/emergent results were called by telephone at the time of interpretation on 02/02/2021 at 9:26 pm to Dr. Dene Gentry , who verbally acknowledged these results. Electronically Signed   By: Inez Catalina M.D.   On: 02/02/2021 21:29   Result Date: 02/02/2021 CLINICAL DATA:  Shortness of breath, positive D-dimer, initial encounter EXAM: CT ANGIOGRAPHY CHEST WITH CONTRAST TECHNIQUE: Multidetector CT imaging of the chest was performed using the standard protocol during bolus administration of intravenous contrast. Multiplanar CT image reconstructions and MIPs were obtained to evaluate the vascular anatomy. CONTRAST:  55mL OMNIPAQUE IOHEXOL 350 MG/ML SOLN COMPARISON:  Chest x-ray from earlier in the same day. FINDINGS: Cardiovascular: Atherosclerotic calcifications of the thoracic aorta and its branches are noted. No aneurysmal dilatation is seen. No dissection is noted. Aortic valve replacement is noted. The pulmonary artery is well visualized within normal branching pattern. There is a focal filling defect identified in the left upper lobe pulmonary artery consistent with pulmonary  embolus at the segmental/subsegmental level. Mediastinum/Nodes: Thoracic inlet is within normal limits. Few scattered mediastinal and hilar lymph nodes are noted. Precarinal node measures 15 mm in short axis. Subcarinal node measures 14 mm in short axis. The esophagus as visualized demonstrates some contrast material likely related to reflux. Small hiatal hernia is noted. Lungs/Pleura: Lungs demonstrate diffuse patchy consolidation bilaterally similar to that seen on prior plain film examination. These changes likely represent multifocal pneumonia. Moderate-sized right-sided pleural effusion is noted. Tiny left effusion is seen. Upper Abdomen: Visualized upper abdomen is within normal limits. Musculoskeletal: Degenerative changes of the thoracic spine are seen. No acute rib fracture is seen. Review of the MIP images confirms the above findings. IMPRESSION: Changes consistent with left upper lobe pulmonary embolus without right heart strain Patchy bilateral consolidation consistent with multifocal pneumonia. Associated bilateral effusions are noted right greater than left as well as associated likely reactive adenopathy. Aortic Atherosclerosis (ICD10-I70.0). Electronically Signed: By: Inez Catalina M.D. On: 02/02/2021 21:23   DG Chest Port 1 View  Result Date: 02/02/2021 CLINICAL DATA:  Shortness of breath. Weakness. Poor oxygen saturation. EXAM: PORTABLE CHEST 1 VIEW COMPARISON:  12/15/2020 and multiple previous FINDINGS: Previous median sternotomy, CABG and aortic valve replacement. Worsening of widespread pulmonary opacity that could represent a combination of pneumonia and edema. There may be a small amount of pleural fluid. No acute  bone finding. IMPRESSION: Worsening of widespread bilateral pulmonary opacity which could be a combination of edema and pneumonia. Electronically Signed   By: Nelson Chimes M.D.   On: 02/02/2021 18:06    EKG: I have personally reviewed EKG: showed  NSR   Assessment/Plan Principal Problem:   Acute pulmonary embolism (HCC) Active Problems:   Acute respiratory failure with hypoxia (HCC)   Multiple pulmonary nodules - with concern for metastatic disease   Pleural effusion on right   Essential hypertension   Hyperlipidemia   Hypothyroidism   Type 2 diabetes mellitus without complication (HCC)   S/P AVR (aortic valve replacement) - 06/25/2012    Acute pulmonary embolism ([place]) Admit to medical telemetry bed. Check echo. Check bilateral lower leg U/S. Given her pulmonary nodules, will need pulmonary consult to see if they can perform bronch with EUS/biopsy prior to starting oral anticoagulants.  Acute respiratory failure with hypoxia (HCC) Continue supplemental O2. Pt does not use home O2  Multiple pulmonary nodules - with concern for metastatic disease Pt with multiple pulmonary nodules. Have been there since 08-2020. Pt reports 50 lbs weight loss in 3 weeks. While pneumonia is a possibility, also need to consider lung cancer. Pt is aware that she will be treated for pneumonia, but we are also worried about potential lung cancer. Pt may benefit from seeing pulmonary consult to consider bronch/EUS biopsy. Pt also has right pleural effusion. Possibility for thoracentesis. If shows this is a malignant effusion, there would not be a need for bronch.  Essential hypertension Stable.  Hyperlipidemia Stable.  Hypothyroidism Stable.  Type 2 diabetes mellitus without complication ([place]) Place on SSI. Continue with lantus.  S/P AVR (aortic valve replacement) - 06/25/2012 Stable.  Pleural effusion on right Patient had pleural effusion the right.  May benefit from thoracentesis.  If this shows malignancy, then patient may not need bronchoscopy with biopsy.  DVT prophylaxis: IV heparin gtts Code Status: Full Code Family Communication: no family at bedside  Disposition Plan: return home  Consults called: none  Admission status:  Inpatient, Telemetry bed   Kristopher Oppenheim, DO [facility] 02/02/2021, 10:24 PM

## 2021-02-02 NOTE — Assessment & Plan Note (Signed)
Admit to medical telemetry bed. Check echo. Check bilateral lower leg U/S. Given her pulmonary nodules, will need pulmonary consult to see if they can perform bronch with EUS/biopsy prior to starting oral anticoagulants.

## 2021-02-02 NOTE — ED Triage Notes (Signed)
SHOB 85% on room air; dropped to 60% with ambulation

## 2021-02-02 NOTE — Progress Notes (Signed)
ANTICOAGULATION CONSULT NOTE - Initial Consult  Pharmacy Consult for heparin dosing Indication: pulmonary embolus  No Known Allergies  Patient Measurements: Height: 5' 3.5" (161.3 cm) Weight: 80.3 kg (177 lb) IBW/kg (Calculated) : 53.55 Heparin Dosing Weight: 70.9 kg  Vital Signs: Temp: 98.7 F (37.1 C) (10/05 1742) Temp Source: Oral (10/05 1742) BP: 138/63 (10/05 2030) Pulse Rate: 73 (10/05 2030)  Labs: Recent Labs    02/02/21 1746 02/02/21 1806 02/02/21 1920  HGB 10.0* 10.5*  --   HCT 30.9* 31.0*  --   PLT 216  --   --   LABPROT  --   --  15.3*  INR  --   --  1.2  CREATININE  --   --  1.11*  TROPONINIHS  --   --  37*    Estimated Creatinine Clearance: 37.6 mL/min (A) (by C-G formula based on SCr of 1.11 mg/dL (H)).   Medical History: Past Medical History:  Diagnosis Date   Aortic valve disorders    S/P AVR 06/2012   Arthritis    Asthma    AS CHILD    Depression    Diabetes mellitus    Heart murmur    Hyperlipidemia    Hypertension    Hypothyroidism    Ischemic heart disease    with prior CABG x 2 in 2014 along with AVR   Lumbago    Shortness of breath    Thyroid disease     Medications:  (Not in a hospital admission)  Scheduled:  Infusions:   ceFEPime (MAXIPIME) IV 2 g (02/02/21 2141)   vancomycin      Assessment: Patient presents with shortness of breath and fatigue. CTPA demonstrated left upper lobe PE. CXR shows multiple nodules concerning for metastatic disease and shows moderate right-sided pleural effusion. D-dimer positive at 0.87. Hgb 10.5, HCT 31.0, PLTs 216.   Med rec not completed yet, but no PTA anticoagulation per outside records. Has reported persistent cough over past couple of weeks with one episode of mildly blood-tinged sputum. Otherwise, no signs or symptoms of bleeding.   Goal of Therapy:  Heparin level 0.3-0.7 units/ml Monitor platelets by anticoagulation protocol: Yes   Plan:  Give 4000 units bolus x 1 Start heparin  infusion at 1200 units/hr Check anti-Xa level in 6 hours and daily while on heparin Continue to monitor H&H and platelets  Donald Pore 02/02/2021,9:41 PM

## 2021-02-02 NOTE — Subjective & Objective (Addendum)
CC: hypoxia HPI: Very sweet 87 year old African-American female with a history of hypertension, type 2 diabetes on insulin, hyperlipidemia, acquired hypothyroidism presents to the ER today for the PCP office.  Patient noted to be hypoxic in the office.  Patient arrived with room air saturations of 85%.  With ambulation her sats dropped to 60%.  Patient states that over the last 3 weeks she has noticed increasing dyspnea and cough.  She took some cough medicine.  She thought that she was initially getting better.  Over the last 2 weeks, she has felt worse.  She is bringing up clear-colored sputum.  She is only had 1 episode that was mildly blood-tinged.  She is increasingly dyspneic with just minimal exertion.  Patient is notes approximately 40-50 pound weight loss over the last 3 weeks.  She states she mentioned this to her PCP.  In the ER, patient noted to be on admission.  Patient placed initially on nonrebreather and then decreased to 2 L a minute.  Laboratory work-up demonstrated BNP of 351.  D-dimer was positive for 0.87.  Chemistry showed a sodium 137 potassium 3.2 chloride of 29 bicarbonate of 16 been of 1.1 glucose of 181.  CBC showed a white count of 9.8, hemoglobin 10.0 platelets of 216.  ABG on 2 L oxygen showed pH 7.43 PCO2 47 PO2 of 61.  Patient's COVID-negative.  CTPA demonstrated left upper lobe pulmonary embolism.  There are multiple pulmonary nodules that were also seen on her chest x-ray and August 2022 and May 2022.  She had mediastinal adenopathy.  There is concern for metastatic disease.  She has a moderate sized right-sided pleural effusion.  Patient lives in an apartment with her 7 year old sister.  Due to the patient's pulmonary embolism, acute hypoxic respiratory failure, concern for metastatic lung disease, [facility] contacted for admission.

## 2021-02-02 NOTE — Assessment & Plan Note (Signed)
Pt with multiple pulmonary nodules. Have been there since 08-2020. Pt reports 50 lbs weight loss in 3 weeks. While pneumonia is a possibility, also need to consider lung cancer. Pt is aware that she will be treated for pneumonia, but we are also worried about potential lung cancer. Pt may benefit from seeing pulmonary consult to consider bronch/EUS biopsy. Pt also has right pleural effusion. Possibility for thoracentesis. If shows this is a malignant effusion, there would not be a need for bronch.

## 2021-02-03 ENCOUNTER — Inpatient Hospital Stay (HOSPITAL_COMMUNITY): Payer: Medicare Other

## 2021-02-03 ENCOUNTER — Encounter (HOSPITAL_COMMUNITY): Payer: Self-pay | Admitting: Internal Medicine

## 2021-02-03 DIAGNOSIS — R0609 Other forms of dyspnea: Secondary | ICD-10-CM

## 2021-02-03 DIAGNOSIS — I2699 Other pulmonary embolism without acute cor pulmonale: Secondary | ICD-10-CM | POA: Diagnosis not present

## 2021-02-03 LAB — URINALYSIS, ROUTINE W REFLEX MICROSCOPIC
Bilirubin Urine: NEGATIVE
Glucose, UA: NEGATIVE mg/dL
Hgb urine dipstick: NEGATIVE
Ketones, ur: NEGATIVE mg/dL
Nitrite: POSITIVE — AB
Protein, ur: 100 mg/dL — AB
Specific Gravity, Urine: 1.035 — ABNORMAL HIGH (ref 1.005–1.030)
pH: 6 (ref 5.0–8.0)

## 2021-02-03 LAB — COMPREHENSIVE METABOLIC PANEL
ALT: 29 U/L (ref 0–44)
AST: 37 U/L (ref 15–41)
Albumin: 2.1 g/dL — ABNORMAL LOW (ref 3.5–5.0)
Alkaline Phosphatase: 709 U/L — ABNORMAL HIGH (ref 38–126)
Anion gap: 8 (ref 5–15)
BUN: 15 mg/dL (ref 8–23)
CO2: 30 mmol/L (ref 22–32)
Calcium: 8.6 mg/dL — ABNORMAL LOW (ref 8.9–10.3)
Chloride: 99 mmol/L (ref 98–111)
Creatinine, Ser: 1.08 mg/dL — ABNORMAL HIGH (ref 0.44–1.00)
GFR, Estimated: 50 mL/min — ABNORMAL LOW (ref >60)
Glucose, Bld: 241 mg/dL — ABNORMAL HIGH (ref 70–99)
Potassium: 3.8 mmol/L (ref 3.5–5.1)
Sodium: 137 mmol/L (ref 135–145)
Total Bilirubin: 0.6 mg/dL (ref 0.3–1.2)
Total Protein: 6.4 g/dL — ABNORMAL LOW (ref 6.5–8.1)

## 2021-02-03 LAB — GLUCOSE, CAPILLARY: Glucose-Capillary: 127 mg/dL — ABNORMAL HIGH (ref 70–99)

## 2021-02-03 LAB — HEPARIN LEVEL (UNFRACTIONATED)
Heparin Unfractionated: >1.1 IU/mL — ABNORMAL HIGH (ref 0.30–0.70)
Heparin Unfractionated: 0.42 IU/mL (ref 0.30–0.70)

## 2021-02-03 LAB — CBC WITH DIFFERENTIAL/PLATELET
Abs Immature Granulocytes: 0.04 10*3/uL (ref 0.00–0.07)
Basophils Absolute: 0.1 10*3/uL (ref 0.0–0.1)
Basophils Relative: 1 %
Eosinophils Absolute: 0.6 10*3/uL — ABNORMAL HIGH (ref 0.0–0.5)
Eosinophils Relative: 6 %
HCT: 32.2 % — ABNORMAL LOW (ref 36.0–46.0)
Hemoglobin: 10.3 g/dL — ABNORMAL LOW (ref 12.0–15.0)
Immature Granulocytes: 0 %
Lymphocytes Relative: 17 %
Lymphs Abs: 1.9 10*3/uL (ref 0.7–4.0)
MCH: 29.4 pg (ref 26.0–34.0)
MCHC: 32 g/dL (ref 30.0–36.0)
MCV: 92 fL (ref 80.0–100.0)
Monocytes Absolute: 1 10*3/uL (ref 0.1–1.0)
Monocytes Relative: 9 %
Neutro Abs: 7.3 10*3/uL (ref 1.7–7.7)
Neutrophils Relative %: 67 %
Platelets: 275 10*3/uL (ref 150–400)
RBC: 3.5 MIL/uL — ABNORMAL LOW (ref 3.87–5.11)
RDW: 17.3 % — ABNORMAL HIGH (ref 11.5–15.5)
WBC: 10.9 10*3/uL — ABNORMAL HIGH (ref 4.0–10.5)
nRBC: 0 % (ref 0.0–0.2)

## 2021-02-03 LAB — ECHOCARDIOGRAM COMPLETE
AR max vel: 1.46 cm2
AV Area VTI: 1.55 cm2
AV Area mean vel: 1.51 cm2
AV Mean grad: 10.5 mmHg
AV Peak grad: 18.6 mmHg
Ao pk vel: 2.16 m/s
Area-P 1/2: 3.5 cm2
Height: 63.5 in
MV M vel: 5.85 m/s
MV Peak grad: 136.9 mmHg
Weight: 2832 oz

## 2021-02-03 LAB — HEMOGLOBIN A1C
Hgb A1c MFr Bld: 6.9 % — ABNORMAL HIGH (ref 4.8–5.6)
Mean Plasma Glucose: 151.33 mg/dL

## 2021-02-03 LAB — CBG MONITORING, ED
Glucose-Capillary: 223 mg/dL — ABNORMAL HIGH (ref 70–99)
Glucose-Capillary: 243 mg/dL — ABNORMAL HIGH (ref 70–99)
Glucose-Capillary: 141 mg/dL — ABNORMAL HIGH (ref 70–99)
Glucose-Capillary: 152 mg/dL — ABNORMAL HIGH (ref 70–99)

## 2021-02-03 LAB — STREP PNEUMONIAE URINARY ANTIGEN: Strep Pneumo Urinary Antigen: NEGATIVE

## 2021-02-03 MED ORDER — AMLODIPINE BESYLATE 5 MG PO TABS
5.0000 mg | ORAL_TABLET | Freq: Every day | ORAL | Status: DC
  Administered 2021-02-07 – 2021-02-08 (×2): 5 mg via ORAL
  Filled 2021-02-03 (×5): qty 1

## 2021-02-03 MED ORDER — IOHEXOL 300 MG/ML  SOLN
100.0000 mL | Freq: Once | INTRAMUSCULAR | Status: AC | PRN
  Administered 2021-02-03: 100 mL via INTRAVENOUS

## 2021-02-03 MED ORDER — CARVEDILOL 12.5 MG PO TABS
12.5000 mg | ORAL_TABLET | Freq: Two times a day (BID) | ORAL | Status: DC
  Administered 2021-02-03 – 2021-02-08 (×7): 12.5 mg via ORAL
  Filled 2021-02-03 (×10): qty 1

## 2021-02-03 MED ORDER — LEVOTHYROXINE SODIUM 75 MCG PO TABS
75.0000 ug | ORAL_TABLET | Freq: Every day | ORAL | Status: DC
  Administered 2021-02-04 – 2021-02-08 (×5): 75 ug via ORAL
  Filled 2021-02-03 (×5): qty 1

## 2021-02-03 MED ORDER — HEPARIN (PORCINE) 25000 UT/250ML-% IV SOLN
1050.0000 [IU]/h | INTRAVENOUS | Status: DC
  Administered 2021-02-03 – 2021-02-04 (×3): 1050 [IU]/h via INTRAVENOUS
  Filled 2021-02-03 (×2): qty 250

## 2021-02-03 MED ORDER — ROSUVASTATIN CALCIUM 20 MG PO TABS
20.0000 mg | ORAL_TABLET | Freq: Every day | ORAL | Status: DC
  Administered 2021-02-04 – 2021-02-08 (×5): 20 mg via ORAL
  Filled 2021-02-03 (×5): qty 1

## 2021-02-03 MED ORDER — IOHEXOL 9 MG/ML PO SOLN
ORAL | Status: AC
  Administered 2021-02-03: 500 mL
  Filled 2021-02-03: qty 1000

## 2021-02-03 NOTE — Plan of Care (Signed)

## 2021-02-03 NOTE — ED Notes (Signed)
Pt brought to yellow 0045 meds not given at 2200

## 2021-02-03 NOTE — ED Notes (Signed)
Pt coughing intermittently

## 2021-02-03 NOTE — Progress Notes (Signed)
ANTICOAGULATION CONSULT NOTE - Follow Up Consult  Pharmacy Consult for heparin dosing Indication: pulmonary embolus  No Known Allergies  Patient Measurements: Height: 5' 3.5" (161.3 cm) Weight: 80.3 kg (177 lb) IBW/kg (Calculated) : 53.55 Heparin Dosing Weight: 70.9 kg  Vital Signs: Temp: 98.4 F (36.9 C) (10/06 0119) BP: 122/94 (10/06 0800) Pulse Rate: 76 (10/06 0800)  Labs: Recent Labs    02/02/21 1746 02/02/21 1806 02/02/21 1920 02/03/21 0704  HGB 10.0* 10.5*  --  10.3*  HCT 30.9* 31.0*  --  32.2*  PLT 216  --   --  275  LABPROT  --   --  15.3*  --   INR  --   --  1.2  --   HEPARINUNFRC  --   --   --  >1.10*  CREATININE  --   --  1.11* 1.08*  TROPONINIHS  --   --  37*  --      Estimated Creatinine Clearance: 38.7 mL/min (A) (by C-G formula based on SCr of 1.08 mg/dL (H)).   Medical History: Past Medical History:  Diagnosis Date   Aortic valve disorders    S/P AVR 06/2012   Arthritis    Asthma    AS CHILD    Depression    Diabetes mellitus    Heart murmur    Hyperlipidemia    Hypertension    Hypothyroidism    Ischemic heart disease    with prior CABG x 2 in 2014 along with AVR   Lumbago    Shortness of breath    Thyroid disease     Medications:  (Not in a hospital admission) Scheduled:   azithromycin  500 mg Oral Daily   insulin aspart  0-15 Units Subcutaneous TID WC   insulin aspart  0-5 Units Subcutaneous QHS   insulin glargine-yfgn  10 Units Subcutaneous QHS   Infusions:   cefTRIAXone (ROCEPHIN)  IV     heparin      Assessment: Patient presents with shortness of breath and fatigue. CTPA demonstrated left upper lobe PE. CXR shows multiple nodules concerning for metastatic disease and shows moderate right-sided pleural effusion. D-dimer positive at 0.87. Hgb 10.5, HCT 31.0, PLTs 216.   Med rec not completed yet, but no PTA anticoagulation per outside records. Has reported persistent cough over past couple of weeks with one episode of  mildly blood-tinged sputum. Otherwise, no signs or symptoms of bleeding.   1st HL > 1.1 this AM, no s/sx of bleeding. RN drew appropriately off of another IV. CBC wnl this AM.  Goal of Therapy:  Heparin level 0.3-0.7 units/ml Monitor platelets by anticoagulation protocol: Yes   Plan: stop heparin infusion for 1 hour Decrease heparin infusion to 1050 units/hr (~2u/kg/hr) Monitor daily CBC and HL F/u 8h HL  Joetta Manners, PharmD, [facility] Emergency Medicine Clinical Pharmacist ED RPh Phone: [place]: [phone]

## 2021-02-03 NOTE — Progress Notes (Signed)
Bilateral lower extremity venous duplex has been completed. Preliminary results can be found in CV Proc through chart review.  Results were given to the patient's nurse, Lynn Ito.  02/03/21 11:38 AM [name] RVT

## 2021-02-03 NOTE — Progress Notes (Signed)
Pt arrived from ED on stretcher and Heparin gtt at 1050 units/hr infusing to right AC. Pt A/O x4 and pleasant. Pt transferred from stretcher to bed via slide method with 4 staff assistance. Skin check completed with no issues observed. Belonging placed in closet: clothes, shoes, and purse. Pt declined for purse to be placed in security; informed of belongings policy with voicing of understanding. Cell phone, cell phone charger at bedside. Questions addressed, pt oriented to room and unit, safety measures intact.

## 2021-02-03 NOTE — Progress Notes (Signed)
ANTICOAGULATION CONSULT NOTE - Follow Up Consult  Pharmacy Consult for heparin dosing Indication: pulmonary embolus  No Known Allergies  Patient Measurements: Height: 5' 3.5" (161.3 cm) Weight: 69.7 kg (153 lb 10.6 oz) IBW/kg (Calculated) : 53.55 Heparin Dosing Weight: 70.9 kg  Vital Signs: Temp: 97.6 F (36.4 C) (10/06 1952) Temp Source: Oral (10/06 1952) BP: 159/67 (10/06 1952) Pulse Rate: 72 (10/06 1952)  Labs: Recent Labs    02/02/21 1746 02/02/21 1806 02/02/21 1920 02/03/21 0704 02/03/21 2013  HGB 10.0* 10.5*  --  10.3*  --   HCT 30.9* 31.0*  --  32.2*  --   PLT 216  --   --  275  --   LABPROT  --   --  15.3*  --   --   INR  --   --  1.2  --   --   HEPARINUNFRC  --   --   --  >1.10* 0.42  CREATININE  --   --  1.11* 1.08*  --   TROPONINIHS  --   --  37*  --   --      Estimated Creatinine Clearance: 36.1 mL/min (A) (by C-G formula based on SCr of 1.08 mg/dL (H)).   Medical History: Past Medical History:  Diagnosis Date   Aortic valve disorders    S/P AVR 06/2012   Arthritis    Asthma    AS CHILD    Depression    Diabetes mellitus    Heart murmur    Hyperlipidemia    Hypertension    Hypothyroidism    Ischemic heart disease    with prior CABG x 2 in 2014 along with AVR   Lumbago    Shortness of breath    Thyroid disease     Medications:  Medications Prior to Admission  Medication Sig Dispense Refill Last Dose   amLODipine (NORVASC) 5 MG tablet Take 5 mg by mouth daily.   02/03/2021   aspirin EC 81 MG tablet Take 1 tablet (81 mg total) by mouth daily.   02/02/2021   calcium carbonate (TUMS - DOSED IN MG ELEMENTAL CALCIUM) 500 MG chewable tablet Chew 1 tablet by mouth daily.   02/03/2021   carvedilol (COREG) 12.5 MG tablet Take 1 tablet (12.5 mg total) by mouth 2 (two) times daily. 180 tablet 3 02/03/2021 at 0900   cholecalciferol (VITAMIN D) 1000 UNITS tablet Take 1,000 Units by mouth daily.   02/03/2021   Coenzyme Q10 (CO Q-10) 100 MG CAPS Take 1  capsule by mouth daily.   02/03/2021   furosemide (LASIX) 20 MG tablet Take 1 tablet (20 mg total) by mouth daily as needed for fluid or edema (or weight gain). 90 tablet 3 unknown   HUMALOG KWIKPEN 100 UNIT/ML KwikPen INJECT 6 TO 8 UNITS INTO THE SKIN TWICE DAILY (Patient taking differently: Inject 6 Units into the skin in the morning and at bedtime.) 45 mL 0 02/03/2021   levothyroxine (SYNTHROID) 75 MCG tablet TAKE 1 TABLET BY MOUTH EVERY DAY 90 tablet 3 02/03/2021   losartan (COZAAR) 50 MG tablet Take 50 mg by mouth daily.   02/03/2021   memantine (NAMENDA) 5 MG tablet Take 5 mg by mouth at bedtime.   02/02/2021   metFORMIN (GLUCOPHAGE-XR) 750 MG 24 hr tablet TAKE 1 TABLET BY MOUTH EVERY DAY (Patient taking differently: Take 750 mg by mouth every evening.) 90 tablet 1 02/02/2021   Multiple Vitamins-Minerals (MULTIVITAMINS THER. W/MINERALS) TABS Take 1 tablet by mouth daily.  02/03/2021   potassium chloride SA (KLOR-CON) 20 MEQ tablet Take 1 tablet (20 mEq total) by mouth 2 (two) times daily. (Patient taking differently: Take 20 mEq by mouth daily.) 30 tablet 0 02/03/2021   rosuvastatin (CRESTOR) 20 MG tablet TAKE 1 TABLET BY MOUTH EVERY DAY 90 tablet 2 02/03/2021   TOUJEO SOLOSTAR 300 UNIT/ML Solostar Pen ADMINISTER 22 UNITS UNDER THE SKIN DAILY (Patient taking differently: Inject 21 Units into the skin in the morning.) 6 mL 3 02/03/2021   vitamin B-12 (CYANOCOBALAMIN) 100 MCG tablet Take 100 mcg by mouth daily.   02/03/2021   Accu-Chek FastClix Lancets MISC Use Accu Chek Fastclix to check blood sugar 4 times daily. 200 each 3    Empagliflozin-metFORMIN HCl (SYNJARDY) 5-500 MG TABS Take 1 tablet by mouth 2 (two) times daily. Takes occassionally (Patient not taking: No sig reported) 60 tablet 1 Completed Course   Insulin Pen Needle (B-D UF III MINI PEN NEEDLES) 31G X 5 MM MISC USE TO INJECT MEDICATION THREE TIMES DAILY 200 each 3    ONETOUCH VERIO test strip USE TO CHECK BLOOD SUGAR FOUR TIMES DAILY AS  DIRECTED 400 strip 3    Scheduled:   [START ON 02/04/2021] amLODipine  5 mg Oral Daily   azithromycin  500 mg Oral Daily   carvedilol  12.5 mg Oral BID   insulin aspart  0-15 Units Subcutaneous TID WC   insulin aspart  0-5 Units Subcutaneous QHS   insulin glargine-yfgn  10 Units Subcutaneous QHS   [START ON 02/04/2021] levothyroxine  75 mcg Oral Daily   [START ON 02/04/2021] rosuvastatin  20 mg Oral Daily   Infusions:   cefTRIAXone (ROCEPHIN)  IV Stopped (02/03/21 1010)   heparin 1,050 Units/hr (02/03/21 1752)    Assessment: Patient presents with shortness of breath and fatigue. CTPA demonstrated left upper lobe PE. CXR shows multiple nodules concerning for metastatic disease and shows moderate right-sided pleural effusion. D-dimer positive at 0.87. Hgb 10.5, HCT 31.0, PLTs 216.   Med rec not completed yet, but no PTA anticoagulation per outside records. Has reported persistent cough over past couple of weeks with one episode of mildly blood-tinged sputum. Otherwise, no signs or symptoms of bleeding.   PM update: Heparin level of 0.42 is therapeutic on heparin 1050 units/hr. Per RN no bleeding noted and no issues IV infusion or access.   Goal of Therapy:  Heparin level 0.3-0.7 units/ml Monitor platelets by anticoagulation protocol: Yes   Plan:  Continue heparin 1050 units/hr  F/u heparin with AM labs  Monitor heparin level, CBC and s/s of bleeding daily   Cristela Felt, PharmD, BCPS Clinical Pharmacist 02/03/2021 9:39 PM

## 2021-02-03 NOTE — Progress Notes (Addendum)
PROGRESS NOTE    [name]  ZOX:[id number] DOB: Sep 20, 1932 DOA: 02/02/2021 PCP: Rogers Blocker, MD   Chief Complaint  Patient presents with   Shortness of Breath   Weakness   Brief Narrative:  Very sweet 87 year old African-American female with Money Mckeithan history of hypertension, type 2 diabetes on insulin, hyperlipidemia, acquired hypothyroidism presents to the ER today for the PCP office.  Patient noted to be hypoxic in the office.  Patient arrived with room air saturations of 85%.  With ambulation her sats dropped to 60%.  Patient states that over the last 3 weeks she has noticed increasing dyspnea and cough.  She took some cough medicine.  She thought that she was initially getting better.  Over the last 2 weeks, she has felt worse.  She is bringing up clear-colored sputum.  She is only had 1 episode that was mildly blood-tinged.  She is increasingly dyspneic with just minimal exertion.  Patient is notes approximately 40-50 pound weight loss over the last 3 weeks.  She states she mentioned this to her PCP.  In the ER, patient noted to be on admission.  Patient placed initially on nonrebreather and then decreased to 2 L Eldrick Penick minute.  Laboratory work-up demonstrated BNP of 351.  D-dimer was positive for 0.87.  Chemistry showed Kasi Lasky sodium 137 potassium 3.2 chloride of 29 bicarbonate of 16 been of 1.1 glucose of 181.  CBC showed [name] white count of 9.8, hemoglobin 10.0 platelets of 216.  ABG on 2 L oxygen showed pH 7.43 PCO2 47 PO2 of 61.  Patient's COVID-negative.  CTPA demonstrated left upper lobe pulmonary embolism.  There are multiple pulmonary nodules that were also seen on her chest x-ray and August 2022 and May 2022.  She had mediastinal adenopathy.  There is concern for metastatic disease.  She has Angelize Ryce moderate sized right-sided pleural effusion.   Patient lives in an apartment with her 7 year old sister.  Due to the patient's pulmonary embolism, acute hypoxic respiratory failure, concern for  metastatic lung disease, [facility] hospitalist contacted for admission.   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 Heparin gtt Echo with normal RVSF, moderate to severe TV, PV regurg moderate to severe LE [place] with acute DVT of L posterior tibial veins  Metastatic Cancer Likely pancreatic Will need to discuss with patient and determine how aggressive she wants to be with workup/treatment  Multifocal Pneumonia Continue ceftriaxone/azithromycin Continue to monitor  Acute hypoxic Respiratory Failure Multiple reasons being treated above  Severely elevated PASP  Moderate to Severe TV Regurgitation  Mild to Moderate MV Regurgitation  Moderate to Severe PV Regurgitation  Pyuria Follow culture  HTN Med rec pending, will discuss with pharmacy  T2DM SSI, basal 10 units Med rec pending  HLD Med rec pending  Hypothyroidism Med rec pending  DVT prophylaxis: heparin Code Status: full  Family Communication: none at bedside Disposition:   Status is: Inpatient  Remains inpatient appropriate because:Inpatient level of care appropriate due to severity of illness  Dispo: The patient is from: Home  Anticipated d/c is to: Home              Patient currently is not medically stable to d/c.   Difficult to place patient No       Consultants:  none  Procedures:  Echo IMPRESSIONS     1. Left ventricular ejection fraction, by estimation, is 60 to 65%. The  left ventricle has normal function.  The left ventricle has no regional  wall motion abnormalities. Left ventricular diastolic parameters are  indeterminate.   2. Right ventricular systolic function is normal. The right ventricular  size is mildly enlarged. There is severely elevated pulmonary artery  systolic pressure. The estimated right ventricular systolic pressure is  40.9 mmHg.   3. The mitral valve is grossly normal. Mild to moderate mitral valve  regurgitation.   4. Tricuspid valve regurgitation is moderate to severe.   5. The aortic valve is grossly normal. Aortic valve regurgitation is not  visualized. There is Nolan Tuazon 21 mm Magna Ease valve present in the aortic  position. Procedure Date: 06/25/2012.   6. Pulmonic valve regurgitation is moderate to severe.      Summary:  RIGHT:  - There is no evidence of deep vein thrombosis in the lower extremity.     - No cystic structure found in the popliteal fossa.     LEFT:  - Findings consistent with acute deep vein thrombosis involving the left  posterior tibial veins.  - No cystic structure found in the popliteal fossa.      Antimicrobials:  Anti-infectives (From admission, onward)    Start     Dose/Rate Route Frequency Ordered Stop   02/03/21 1000  azithromycin (ZITHROMAX) tablet 500 mg        500 mg Oral Daily 02/02/21 2250     02/03/21 0800  cefTRIAXone (ROCEPHIN) 2 g in sodium chloride 0.9 % 100 mL IVPB        2 g 200 mL/hr over 30 Minutes Intravenous Every 24 hours 02/02/21 2250     02/02/21 2145  vancomycin (VANCOREADY) IVPB 1500 mg/300 mL        1,500 mg 150 mL/hr over 120 Minutes Intravenous  Once 02/02/21 2138 02/03/21 0100   02/02/21 2130  ceFEPIme (MAXIPIME) 2 g in sodium chloride 0.9 % 100 mL IVPB        2 g 200 mL/hr over 30 Minutes Intravenous  Once 02/02/21 2129 02/02/21 2211          Subjective: No new complaints  Objective: Vitals:   02/03/21 0800 02/03/21 1100 02/03/21 1400 02/03/21 1753  BP: (!) 122/94 (!) 114/56 (!) 138/51 (!)  148/64  Pulse: 76 66 66 70  Resp: 18 17 17 20   Temp:    98 F (36.7 C)  TempSrc:      SpO2: 95% 95% 96% 96%  Weight:      Height:        Intake/Output Summary (Last 24 hours) at 02/03/2021 1936 Last data filed at 02/02/2021 2211 Gross per 24 hour  Intake 100 ml  Output --  Net 100 ml   Filed Weights   02/02/21 1739  Weight: 80.3 kg    Examination:  General exam: Appears calm and comfortable  Respiratory system: Clear to auscultation. Respiratory effort normal. Cardiovascular system: RRR Gastrointestinal system: Abdomen is nondistended, soft and nontender.  Central nervous system: Alert and oriented. No focal neurological deficits. Extremities: no LEE Skin: No rashes, lesions or ulcers Psychiatry: Judgement and insight appear normal. Mood &  affect appropriate.     Data Reviewed: I have personally reviewed following labs and imaging studies  CBC: Recent Labs  Lab 02/02/21 1746 02/02/21 1806 02/03/21 0704  WBC 9.8  --  10.9*  NEUTROABS 6.6  --  7.3  HGB 10.0* 10.5* 10.3*  HCT 30.9* 31.0* 32.2*  MCV 91.4  --  92.0  PLT 216  --  355    Basic Metabolic Panel: Recent Labs  Lab 02/02/21 1806 02/02/21 1920 02/03/21 0704  NA 138 137 137  K 3.0* 3.2* 3.8  CL  --  97* 99  CO2  --  29 30  GLUCOSE  --  181* 241*  BUN  --  16 15  CREATININE  --  1.11* 1.08*  CALCIUM  --  9.0 8.6*    GFR: Estimated Creatinine Clearance: 38.7 mL/min ([name]) (by C-G formula based on SCr of 1.08 mg/dL (H)).  Liver Function Tests: Recent Labs  Lab 02/02/21 1920 02/03/21 0704  AST 53* 37  ALT 32 29  ALKPHOS 826* 709*  BILITOT 0.5 0.6  PROT 7.0 6.4*  ALBUMIN 2.5* 2.1*    CBG: Recent Labs  Lab 02/03/21 0755 02/03/21 0843 02/03/21 1215 02/03/21 1729  GLUCAP 223* 243* 141* 152*     Recent Results (from the past 240 hour(s))  Resp Panel by RT-PCR (Flu Ed Rayson&B, Covid) Nasopharyngeal Swab     Status: None   Collection Time: 02/02/21  5:42 PM   Specimen: Nasopharyngeal Swab;  Nasopharyngeal(NP) swabs in vial transport medium  Result Value Ref Range Status   SARS Coronavirus 2 by RT PCR NEGATIVE NEGATIVE Final    Comment: (NOTE) SARS-CoV-2 target nucleic acids are NOT DETECTED.  The SARS-CoV-2 RNA is generally detectable in upper respiratory specimens during the acute phase of infection. The lowest concentration of SARS-CoV-2 viral copies this assay can detect is 138 copies/mL. [name] negative result does not preclude SARS-Cov-2 infection and should not be used as the sole basis for treatment or other patient management decisions. [name] negative result may occur with  improper specimen collection/handling, submission of specimen other than nasopharyngeal swab, presence of viral mutation(s) within the areas targeted by this assay, and inadequate number of viral copies(<138 copies/mL). [name] negative result must be combined with clinical observations, patient history, and epidemiological information. The expected result is Negative.  Fact Sheet for Patients:  EntrepreneurPulse.com.au  Fact Sheet for Healthcare Providers:  IncredibleEmployment.be  This test is no t yet approved or cleared by the [place] FDA and  has been authorized for detection and/or diagnosis of SARS-CoV-2 by FDA under an Emergency Use Authorization (EUA). This EUA will remain  in effect (meaning this test can be used) for the duration of the COVID-19 declaration under Section 564(b)(1) of the Act, 21 U.S.C.section 360bbb-3(b)(1), unless the authorization is terminated  or revoked sooner.       Influenza Elon Eoff by PCR NEGATIVE NEGATIVE Final   Influenza B by PCR NEGATIVE NEGATIVE Final    Comment: (NOTE) The Xpert Xpress SARS-CoV-2/FLU/RSV plus assay is intended as an aid in the diagnosis of influenza from Nasopharyngeal swab specimens and should not be used as [name] sole basis for treatment. Nasal washings and aspirates are unacceptable for Xpert Xpress  SARS-CoV-2/FLU/RSV testing.  Fact Sheet for Patients: EntrepreneurPulse.com.au  Fact Sheet for Healthcare Providers: IncredibleEmployment.be  This test is not yet approved or cleared by the [place] FDA and has been authorized for detection and/or diagnosis of SARS-CoV-2 by FDA under an Emergency Use Authorization (EUA).  This EUA will remain in effect (meaning this test can be used) for the duration of the COVID-19 declaration under Section 564(b)(1) of the Act, 21 U.S.C. section 360bbb-3(b)(1), unless the authorization is terminated or revoked.  Performed at [facility], [place] [address] [place], [place] [address]   Culture, blood (routine x 2)     Status: None (Preliminary result)   Collection Time: 02/02/21  7:20 PM   Specimen: BLOOD RIGHT ARM  Result Value Ref Range Status   Specimen Description BLOOD RIGHT ARM  Final   Special Requests   Final    BOTTLES DRAWN AEROBIC AND ANAEROBIC Blood Culture results may not be optimal due to an excessive volume of blood received in culture bottles   Culture   Final    NO GROWTH < 12 HOURS Performed at [facility], [place] [address], [place], [place] [address]    Report Status PENDING  Incomplete  Culture, blood (routine x 2)     Status: None (Preliminary result)   Collection Time: 02/02/21  7:23 PM   Specimen: BLOOD LEFT ARM  Result Value Ref Range Status   Specimen Description BLOOD LEFT ARM  Final   Special Requests   Final    BOTTLES DRAWN AEROBIC AND ANAEROBIC Blood Culture results may not be optimal due to an excessive volume of blood received in culture bottles   Culture   Final    NO GROWTH < 12 HOURS Performed at [facility], [place] [address], [place], [place] [address]    Report Status PENDING  Incomplete         Radiology Studies: CT Angio Chest PE W and/or Wo Contrast  Addendum Date: 02/02/2021   ADDENDUM REPORT: 02/02/2021 21:29 ADDENDUM: Given the  longstanding nature of the nodular opacities in both lungs the possibility of metastatic disease would deserve consideration although felt to be less likely. Critical Value/emergent results were called by telephone at the time of interpretation on 02/02/2021 at 9:26 pm to Dr. Dene [name] , who verbally acknowledged these results. Electronically Signed   By: Inez Catalina M.D.   On: 02/02/2021 21:29   Result Date: 02/02/2021 CLINICAL DATA:  Shortness of breath, positive D-dimer, initial encounter EXAM: CT ANGIOGRAPHY CHEST WITH CONTRAST TECHNIQUE: Multidetector CT imaging of the chest was performed using the standard protocol during bolus administration of intravenous contrast. Multiplanar CT image reconstructions and MIPs were obtained to evaluate the vascular anatomy. CONTRAST:  1mL OMNIPAQUE IOHEXOL 350 MG/ML SOLN COMPARISON:  Chest x-ray from earlier in the same day. FINDINGS: Cardiovascular: Atherosclerotic calcifications of the thoracic aorta and its branches are noted. No aneurysmal dilatation is seen. No dissection is noted. Aortic valve replacement is noted. The pulmonary artery is well visualized within normal branching pattern. There is [name] focal filling defect identified in the left upper lobe pulmonary artery consistent with pulmonary embolus at the segmental/subsegmental level. Mediastinum/Nodes: Thoracic inlet is within normal limits. Few scattered mediastinal and hilar lymph nodes are noted. Precarinal node measures 15 mm in short axis. Subcarinal node measures 14 mm in short axis. The esophagus as visualized demonstrates some contrast material likely related to reflux. Small hiatal hernia is noted. Lungs/Pleura: Lungs demonstrate diffuse patchy consolidation bilaterally similar to that seen on prior plain film examination. These changes likely represent multifocal pneumonia. Moderate-sized right-sided pleural effusion is noted. Tiny left effusion is seen. Upper Abdomen: Visualized upper abdomen is  within normal limits. Musculoskeletal: Degenerative changes of the thoracic spine are seen.  No acute rib fracture is seen. Review of the MIP images confirms the above findings. IMPRESSION: Changes consistent with left upper lobe pulmonary embolus without right heart strain Patchy bilateral consolidation consistent with multifocal pneumonia. Associated bilateral effusions are noted right greater than left as well as associated likely reactive adenopathy. Aortic Atherosclerosis (ICD10-I70.0). Electronically Signed: By: Inez Catalina M.D. On: 02/02/2021 21:23   CT ABDOMEN PELVIS W CONTRAST  Result Date: 02/03/2021 CLINICAL DATA:  Metastatic disease evaluation. EXAM: CT ABDOMEN AND PELVIS WITH CONTRAST TECHNIQUE: Multidetector CT imaging of the abdomen and pelvis was performed using the standard protocol following bolus administration of intravenous contrast. CONTRAST:  149mL OMNIPAQUE IOHEXOL 300 MG/ML  SOLN COMPARISON:  CT angiogram chest 02/02/2021. FINDINGS: Lower chest: Small right pleural effusion and bilateral multifocal airspace opacities appear unchanged from the prior examination. Heart is mildly enlarged. Coronary artery calcifications are present. Hepatobiliary: There are numerous hypodense hepatic lesions suspicious for metastatic disease measuring up to 1.9 cm. There is geographic fatty infiltration of the liver versus perfusion abnormality. Gallstones are present. Common bile duct is dilated measuring 1 cm. There is mild intrahepatic biliary ductal dilatation. Pancreas: Ill-defined hypodensity within the region of the pancreatic head measuring 2.3 x 3.3 cm suspicious for neoplasm. There is diffuse pancreatic atrophy and ductal dilatation of the body and tail the pancreas. Spleen: Normal in size without focal abnormality. Adrenals/Urinary Tract: Adrenal glands are unremarkable. Kidneys are normal, without renal calculi, focal lesion, or hydronephrosis. Bladder is unremarkable. Stomach/Bowel: Stomach is  within normal limits. Appendix is not seen. No evidence of bowel wall thickening, distention, or inflammatory changes. There is diffuse colonic diverticulosis without evidence for acute diverticulitis. Vascular/Lymphatic: Aorta is normal in size. There are atherosclerotic calcifications of the aorta. There are some prominent left periaortic lymph nodes measuring up to 9 mm. There is thrombosis of the main portal vein extending into the right main portal vein. There is also thrombosis within the portal confluence and superior mesenteric vein at the level of the pancreas. Splenic vein grossly patent. Reproductive: Small calcified uterine fibroids. Adnexa unremarkable. Other: Small amount of ascites.  Central mesenteric haziness. Musculoskeletal: No acute or significant osseous findings. IMPRESSION: 1. Low-density pancreatic head mass with distal pancreatic atrophy and ductal dilatation most compatible with primary pancreatic neoplasm. 2. Multiple hepatic lesions suspicious for hepatic metastatic disease. 3. Gallstones are present. There is intra and extrahepatic biliary ductal dilatation, likely secondary to pancreatic head mass. 4. Thrombosis within the right portal vein, main portal vein, portal confluence and superior mesenteric vein. 5. Small volume ascites. 6. Prominent left periaortic lymph nodes. 7. Stable multifocal airspace opacities and small right pleural effusion. 8.  Aortic Atherosclerosis (ICD10-I70.0). 9. These results were called by telephone at the time of interpretation on 02/03/2021 at 3:49 pm to provider [name] , who verbally acknowledged these results. Electronically Signed   By: Ronney Asters M.D.   On: 02/03/2021 15:50   DG Chest Port 1 View  Result Date: 02/02/2021 CLINICAL DATA:  Shortness of breath. Weakness. Poor oxygen saturation. EXAM: PORTABLE CHEST 1 VIEW COMPARISON:  12/15/2020 and multiple previous FINDINGS: Previous median sternotomy, CABG and aortic valve replacement.  Worsening of widespread pulmonary opacity that could represent Disha Cottam combination of pneumonia and edema. There may be Antasia Haider small amount of pleural fluid. No acute bone finding. IMPRESSION: Worsening of widespread bilateral pulmonary opacity which could be Zoie Sarin combination of edema and pneumonia. Electronically Signed   By: Nelson Chimes M.D.   On: 02/02/2021 18:06  ECHOCARDIOGRAM COMPLETE  Result Date: 02/03/2021    ECHOCARDIOGRAM REPORT   Patient Name:   BRIELYNN SEKULA Date of Exam: 02/03/2021 Medical Rec #:  [id number]         Height:       63.5 in Accession #:    [id number]        Weight:       177.0 lb Date of Birth:  04/12/1933        BSA:          1.847 m Patient Age:    10 years          BP:           138/51 mmHg Patient Gender: F                 HR:           67 bpm. Exam Location:  Inpatient Procedure: 2D Echo, 3D Echo, Cardiac Doppler and Color Doppler Indications:    Dyspnea R06.00  History:        Patient has prior history of Echocardiogram examinations, most                 recent 10/07/2020. CAD, Prior CABG; Risk Factors:Hypertension,                 Diabetes and Dyslipidemia. Multiple pulmonary nodules, Acute                 respiratory failure with hypoxia.                 Aortic Valve: 21 mm Magna Ease valve is present in the aortic                 position. Procedure Date: 06/25/2012.  Sonographer:    Darlina Sicilian RDCS Referring Phys: [place]  1. Left ventricular ejection fraction, by estimation, is 60 to 65%. The left ventricle has normal function. The left ventricle has no regional wall motion abnormalities. Left ventricular diastolic parameters are indeterminate.  2. Right ventricular systolic function is normal. The right ventricular size is mildly enlarged. There is severely elevated pulmonary artery systolic pressure. The estimated right ventricular systolic pressure is 57.0 mmHg.  3. The mitral valve is grossly normal. Mild to moderate mitral valve regurgitation.  4. Tricuspid  valve regurgitation is moderate to severe.  5. The aortic valve is grossly normal. Aortic valve regurgitation is not visualized. There is Livingston Denner 21 mm Magna Ease valve present in the aortic position. Procedure Date: 06/25/2012.  6. Pulmonic valve regurgitation is moderate to severe. FINDINGS  Left Ventricle: Left ventricular ejection fraction, by estimation, is 60 to 65%. The left ventricle has normal function. The left ventricle has no regional wall motion abnormalities. The left ventricular internal cavity size was normal in size. There is  no left ventricular hypertrophy. Left ventricular diastolic parameters are indeterminate. Right Ventricle: The right ventricular size is mildly enlarged. Right vetricular wall thickness was not well visualized. Right ventricular systolic function is normal. There is severely elevated pulmonary artery systolic pressure. The tricuspid regurgitant velocity is 3.85 m/s, and with an assumed right atrial pressure of 3 mmHg, the estimated right ventricular systolic pressure is 17.7 mmHg. Left Atrium: Left atrial size was normal in size. Right Atrium: Right atrial size was normal in size. Pericardium: There is no evidence of pericardial effusion. Mitral Valve: The mitral valve is grossly normal. Mild to moderate mitral valve regurgitation. Tricuspid Valve: The tricuspid valve  is grossly normal. Tricuspid valve regurgitation is moderate to severe. Aortic Valve: The aortic valve is grossly normal. Aortic valve regurgitation is not visualized. Aortic valve mean gradient measures 10.5 mmHg. Aortic valve peak gradient measures 18.6 mmHg. Aortic valve area, by VTI measures 1.55 cm. There is Jasmin Winberry 21 mm Magna Ease valve present in the aortic position. Procedure Date: 06/25/2012. Pulmonic Valve: The pulmonic valve was grossly normal. Pulmonic valve regurgitation is moderate to severe. Aorta: The aortic root and ascending aorta are structurally normal, with no evidence of dilitation. IAS/Shunts: The  atrial septum is grossly normal.  LEFT VENTRICLE PLAX 2D LVOT diam:     1.90 cm   Diastology LV SV:         73        LV e' medial:    4.30 cm/s LV SV Index:   39        LV E/e' medial:  21.8 LVOT Area:     2.84 cm  LV e' lateral:   8.31 cm/s                          LV E/e' lateral: 11.3                           3D Volume EF:                          3D EF:        61 %                          LV EDV:       118 ml                          LV ESV:       46 ml                          LV SV:        72 ml RIGHT VENTRICLE RV S prime:     6.97 cm/s TAPSE (M-mode): 2.0 cm LEFT ATRIUM             Index        RIGHT ATRIUM           Index LA Vol (A2C):   53.3 ml 28.86 ml/m  RA Area:     14.40 cm LA Vol (A4C):   53.0 ml 28.70 ml/m  RA Volume:   32.10 ml  17.38 ml/m LA Biplane Vol: 54.7 ml 29.62 ml/m  AORTIC VALVE AV Area (Vmax):    1.46 cm AV Area (Vmean):   1.51 cm AV Area (VTI):     1.55 cm AV Vmax:           215.75 cm/s AV Vmean:          153.000 cm/s AV VTI:            0.469 m AV Peak Grad:      18.6 mmHg AV Mean Grad:      10.5 mmHg LVOT Vmax:         111.00 cm/s LVOT Vmean:        81.500 cm/s LVOT VTI:          0.257 m LVOT/AV VTI ratio: 0.55  AORTA Ao Root diam: 2.30 cm Ao Asc diam:  3.10 cm MITRAL VALVE                TRICUSPID VALVE MV Area (PHT): 3.50 cm     TR Peak grad:   59.3 mmHg MV Decel Time: 217 msec     TR Vmax:        385.00 cm/s MR Peak grad: 136.9 mmHg MR Mean grad: 92.0 mmHg     SHUNTS MR Vmax:      585.00 cm/s   Systemic VTI:  0.26 m MR Vmean:     465.0 cm/s    Systemic Diam: 1.90 cm MV E velocity: 93.55 cm/s MV Emilene Roma velocity: 124.00 cm/s MV E/Antoni Stefan ratio:  0.75 Jody Moores MD Electronically signed by Jody Moores MD Signature Date/Time: 02/03/2021/3:44:37 PM    Final    VAS [place] LOWER EXTREMITY VENOUS (DVT)  Result Date: 02/03/2021  Lower Venous DVT Study Patient Name:  KASSIDY DOCKENDORF  Date of Exam:   02/03/2021 Medical Rec #: [id number]          Accession #:    [id number] Date of Birth:  [birth year]         Patient Gender: F Patient Age:   89 years Exam Location:  [facility] Procedure:      VAS [place] LOWER EXTREMITY VENOUS (DVT) Referring Phys: ERIC CHEN --------------------------------------------------------------------------------  Indications: Pulmonary embolism.  Risk Factors: Confirmed PE. Anticoagulation: Heparin. Limitations: Poor ultrasound/tissue interface. Comparison Study: No prior studies. Performing Technologist: Oliver Hum RVT  Examination Guidelines: [name] complete evaluation includes B-mode imaging, spectral Doppler, color Doppler, and power Doppler as needed of all accessible portions of each vessel. Bilateral testing is considered an integral part of [name] complete examination. Limited examinations for reoccurring indications may be performed as noted. The reflux portion of the exam is performed with the patient in reverse Trendelenburg.  +---------+---------------+---------+-----------+----------+--------------+ RIGHT    CompressibilityPhasicitySpontaneityPropertiesThrombus Aging +---------+---------------+---------+-----------+----------+--------------+ CFV      Full           Yes      Yes                                 +---------+---------------+---------+-----------+----------+--------------+ SFJ      Full                                                        +---------+---------------+---------+-----------+----------+--------------+ FV Prox  Full                                                        +---------+---------------+---------+-----------+----------+--------------+ FV Mid   Full                                                        +---------+---------------+---------+-----------+----------+--------------+ FV DistalFull                                                        +---------+---------------+---------+-----------+----------+--------------+  PFV      Full                                                         +---------+---------------+---------+-----------+----------+--------------+ POP      Full           Yes      Yes                                 +---------+---------------+---------+-----------+----------+--------------+ PTV      Full                                                        +---------+---------------+---------+-----------+----------+--------------+ PERO     Full                                                        +---------+---------------+---------+-----------+----------+--------------+   +---------+---------------+---------+-----------+----------+--------------+ LEFT     CompressibilityPhasicitySpontaneityPropertiesThrombus Aging +---------+---------------+---------+-----------+----------+--------------+ CFV      Full           Yes      Yes                                 +---------+---------------+---------+-----------+----------+--------------+ SFJ      Full                                                        +---------+---------------+---------+-----------+----------+--------------+ FV Prox  Full                                                        +---------+---------------+---------+-----------+----------+--------------+ FV Mid   Full                                                        +---------+---------------+---------+-----------+----------+--------------+ FV DistalFull                                                        +---------+---------------+---------+-----------+----------+--------------+ PFV      Full                                                        +---------+---------------+---------+-----------+----------+--------------+  POP      Full           Yes      Yes                                 +---------+---------------+---------+-----------+----------+--------------+ PTV      None                                         Acute           +---------+---------------+---------+-----------+----------+--------------+ PERO     Full                                                        +---------+---------------+---------+-----------+----------+--------------+     Summary: RIGHT: - There is no evidence of deep vein thrombosis in the lower extremity.  - No cystic structure found in the popliteal fossa.  LEFT: - Findings consistent with acute deep vein thrombosis involving the left posterior tibial veins. - No cystic structure found in the popliteal fossa.  *See table(s) above for measurements and observations. Electronically signed by Jamelle Haring on 02/03/2021 at 2:22:28 PM.    Final         Scheduled Meds:  azithromycin  500 mg Oral Daily   insulin aspart  0-15 Units Subcutaneous TID WC   insulin aspart  0-5 Units Subcutaneous QHS   insulin glargine-yfgn  10 Units Subcutaneous QHS   Continuous Infusions:  cefTRIAXone (ROCEPHIN)  IV Stopped (02/03/21 1010)   heparin 1,050 Units/hr (02/03/21 1752)     LOS: 1 day    Time spent: over 30 min    Fayrene Helper, MD [facility]   To contact the attending provider between 7A-7P or the covering provider during after hours 7P-7A, please log into the web site [email or web address] and access using universal [place] password for that web site. If you do not have the password, please call the hospital operator.  02/03/2021, 7:36 PM

## 2021-02-03 NOTE — Progress Notes (Signed)
  Echocardiogram 2D Echocardiogram has been performed.  Darlina Sicilian M 02/03/2021, 2:18 PM

## 2021-02-04 ENCOUNTER — Encounter (HOSPITAL_COMMUNITY): Payer: Self-pay | Admitting: Internal Medicine

## 2021-02-04 ENCOUNTER — Inpatient Hospital Stay (HOSPITAL_COMMUNITY): Payer: Medicare Other

## 2021-02-04 DIAGNOSIS — Z7189 Other specified counseling: Secondary | ICD-10-CM

## 2021-02-04 DIAGNOSIS — Z515 Encounter for palliative care: Secondary | ICD-10-CM

## 2021-02-04 LAB — GLUCOSE, CAPILLARY
Glucose-Capillary: 85 mg/dL (ref 70–99)
Glucose-Capillary: 152 mg/dL — ABNORMAL HIGH (ref 70–99)
Glucose-Capillary: 103 mg/dL — ABNORMAL HIGH (ref 70–99)

## 2021-02-04 LAB — COMPREHENSIVE METABOLIC PANEL
ALT: 25 U/L (ref 0–44)
AST: 35 U/L (ref 15–41)
Albumin: 2.2 g/dL — ABNORMAL LOW (ref 3.5–5.0)
Alkaline Phosphatase: 675 U/L — ABNORMAL HIGH (ref 38–126)
Anion gap: 5 (ref 5–15)
BUN: 10 mg/dL (ref 8–23)
CO2: 31 mmol/L (ref 22–32)
Calcium: 8.5 mg/dL — ABNORMAL LOW (ref 8.9–10.3)
Chloride: 100 mmol/L (ref 98–111)
Creatinine, Ser: 0.9 mg/dL (ref 0.44–1.00)
GFR, Estimated: >60 mL/min (ref >60)
Glucose, Bld: 88 mg/dL (ref 70–99)
Potassium: 3.7 mmol/L (ref 3.5–5.1)
Sodium: 136 mmol/L (ref 135–145)
Total Bilirubin: 0.7 mg/dL (ref 0.3–1.2)
Total Protein: 6.6 g/dL (ref 6.5–8.1)

## 2021-02-04 LAB — BLOOD GAS, VENOUS
Acid-Base Excess: 7.1 mmol/L — ABNORMAL HIGH (ref 0.0–2.0)
Bicarbonate: 32.8 mmol/L — ABNORMAL HIGH (ref 20.0–28.0)
FIO2: 32
O2 Saturation: 75.6 %
Patient temperature: 37
pCO2, Ven: 63.5 mmHg — ABNORMAL HIGH (ref 44.0–60.0)
pH, Ven: 7.333 (ref 7.250–7.430)
pO2, Ven: 45 mmHg (ref 32.0–45.0)

## 2021-02-04 LAB — CBC WITH DIFFERENTIAL/PLATELET
Abs Immature Granulocytes: 0.04 10*3/uL (ref 0.00–0.07)
Basophils Absolute: 0.1 10*3/uL (ref 0.0–0.1)
Basophils Relative: 1 %
Eosinophils Absolute: 0.6 10*3/uL — ABNORMAL HIGH (ref 0.0–0.5)
Eosinophils Relative: 6 %
HCT: 32.4 % — ABNORMAL LOW (ref 36.0–46.0)
Hemoglobin: 10.1 g/dL — ABNORMAL LOW (ref 12.0–15.0)
Immature Granulocytes: 0 %
Lymphocytes Relative: 16 %
Lymphs Abs: 1.8 10*3/uL (ref 0.7–4.0)
MCH: 29.2 pg (ref 26.0–34.0)
MCHC: 31.2 g/dL (ref 30.0–36.0)
MCV: 93.6 fL (ref 80.0–100.0)
Monocytes Absolute: 1 10*3/uL (ref 0.1–1.0)
Monocytes Relative: 9 %
Neutro Abs: 7.6 10*3/uL (ref 1.7–7.7)
Neutrophils Relative %: 68 %
Platelets: 253 10*3/uL (ref 150–400)
RBC: 3.46 MIL/uL — ABNORMAL LOW (ref 3.87–5.11)
RDW: 17.2 % — ABNORMAL HIGH (ref 11.5–15.5)
WBC: 11.2 10*3/uL — ABNORMAL HIGH (ref 4.0–10.5)
nRBC: 0 % (ref 0.0–0.2)

## 2021-02-04 LAB — PHOSPHORUS: Phosphorus: 2.8 mg/dL (ref 2.5–4.6)

## 2021-02-04 LAB — HEPARIN LEVEL (UNFRACTIONATED): Heparin Unfractionated: 0.6 IU/mL (ref 0.30–0.70)

## 2021-02-04 LAB — MAGNESIUM: Magnesium: 2.1 mg/dL (ref 1.7–2.4)

## 2021-02-04 LAB — LEGIONELLA PNEUMOPHILA SEROGP 1 UR AG: L. pneumophila Serogp 1 Ur Ag: NEGATIVE

## 2021-02-04 MED ORDER — FUROSEMIDE 10 MG/ML IJ SOLN
20.0000 mg | Freq: Once | INTRAMUSCULAR | Status: AC
  Administered 2021-02-04: 20 mg via INTRAVENOUS
  Filled 2021-02-04: qty 2

## 2021-02-04 MED ORDER — ENOXAPARIN SODIUM 80 MG/0.8ML IJ SOSY
1.0000 mg/kg | PREFILLED_SYRINGE | Freq: Two times a day (BID) | INTRAMUSCULAR | Status: DC
  Administered 2021-02-04 – 2021-02-05 (×2): 70 mg via SUBCUTANEOUS
  Filled 2021-02-04 (×2): qty 0.8

## 2021-02-04 MED ORDER — MEMANTINE HCL 5 MG PO TABS
5.0000 mg | ORAL_TABLET | Freq: Every day | ORAL | Status: DC
  Administered 2021-02-04 – 2021-02-08 (×4): 5 mg via ORAL
  Filled 2021-02-04 (×4): qty 1

## 2021-02-04 MED ORDER — POTASSIUM CHLORIDE CRYS ER 20 MEQ PO TBCR
40.0000 meq | EXTENDED_RELEASE_TABLET | Freq: Once | ORAL | Status: AC
  Administered 2021-02-04: 40 meq via ORAL
  Filled 2021-02-04: qty 2

## 2021-02-04 MED ORDER — HALOPERIDOL LACTATE 5 MG/ML IJ SOLN
2.0000 mg | Freq: Four times a day (QID) | INTRAMUSCULAR | Status: DC | PRN
  Administered 2021-02-05: 2 mg via INTRAVENOUS
  Filled 2021-02-04 (×2): qty 1

## 2021-02-04 MED ORDER — ENOXAPARIN SODIUM 80 MG/0.8ML IJ SOSY: 1.0000 mg/kg | PREFILLED_SYRINGE | Freq: Two times a day (BID) | INTRAMUSCULAR | Status: DC

## 2021-02-04 NOTE — Progress Notes (Signed)
PROGRESS NOTE    [name]  ZOX:[id number] DOB: [birth year] DOA: 02/02/2021 PCP: Rogers Blocker, MD   Chief Complaint  Patient presents with   Shortness of Breath   Weakness   Brief Narrative:  Very sweet 87 year old African-American female with [name] history of hypertension, type 2 diabetes on insulin, hyperlipidemia, acquired hypothyroidism presents to the ER today for the PCP office.  Patient noted to be hypoxic in the office.  Patient arrived with room air saturations of 85%.  With ambulation her sats dropped to 60%.  Patient states that over the last 3 weeks she has noticed increasing dyspnea and cough.  She took some cough medicine.  She thought that she was initially getting better.  Over the last 2 weeks, she has felt worse.  She is bringing up clear-colored sputum.  She is only had 1 episode that was mildly blood-tinged.  She is increasingly dyspneic with just minimal exertion.  Patient is notes approximately 40-50 pound weight loss over the last 3 weeks.  She states she mentioned this to her PCP.  In the ER, patient noted to be on admission.  Patient placed initially on nonrebreather and then decreased to 2 L Orman Matsumura minute.  Laboratory work-up demonstrated BNP of 351.  D-dimer was positive for 0.87.  Chemistry showed Dontavian Marchi sodium 137 potassium 3.2 chloride of 29 bicarbonate of 16 been of 1.1 glucose of 181.  CBC showed Brodee Mauritz white count of 9.8, hemoglobin 10.0 platelets of 216.  ABG on 2 L oxygen showed pH 7.43 PCO2 47 PO2 of 61.  Patient's COVID-negative.  CTPA demonstrated left upper lobe pulmonary embolism.  There are multiple pulmonary nodules that were also seen on her chest x-ray and August 2022 and May 2022.  She had mediastinal adenopathy.  There is concern for metastatic disease.  She has Suann Klier moderate sized right-sided pleural effusion.   Patient lives in an apartment with her 79 year old sister.  Due to the patient's pulmonary embolism, acute hypoxic respiratory failure, concern for  metastatic lung disease, [facility] hospitalist contacted for admission.   Assessment & Plan:   Principal Problem:   Acute pulmonary embolism (HCC) Active Problems:   Essential hypertension   Hyperlipidemia   Hypothyroidism   Type 2 diabetes mellitus without complication (HCC)   Acute respiratory failure with hypoxia (HCC)   Multiple pulmonary nodules - with concern for metastatic disease   S/P AVR (aortic valve replacement) - 06/25/2012   Pleural effusion on right  Goals of care Unfortunately delirious this AM, not able to participate in discussion of GOC this AM.  Discussed her CT findings and likely metastatic pancreatic cancer with her sister.  She's not sure what she might desire in this situation (definitive dx and tx vs comfort route).  Will treat delirium, hopefully this improves and [name] can give some insight, but will also c/s palliative care.    Acute Metabolic Encephalopathy Delirium related to hospitalization, pneumonia, etc - she's on namenda (no hx of dementia in chart, ?cognitive deficit) Follow venous blood gas Continue to treat PE, pneumonia Delirium precautions W/u additionally as indicated  Left Upper Lobe Pulmonary Embolus  Portal Vein Thrombosis  Superior Mesenteric Vein Thrombosis 2/2 metastatic cancer Heparin gtt -> lovenox for now Echo with normal RVSF, moderate to severe TV, PV regurg moderate to severe LE [place] with acute DVT of L posterior tibial veins  Metastatic Cancer Likely pancreatic Will need to discuss with patient and determine how aggressive she wants to be with workup/treatment -  limited by delirium  Multifocal Pneumonia Continue ceftriaxone/azithromycin -> plan for 5 days Repeat CXR today Trial lasix, ? Component of edema/overload Continue to monitor  Acute hypoxic Respiratory Failure Multiple reasons being treated above  Severely elevated PASP  Moderate to Severe TV Regurgitation  Mild to Moderate MV Regurgitation  Moderate to  Severe PV Regurgitation  Pyuria Follow culture  HTN coreg  T2DM SSI, basal 10 units Continue to monitor  HLD crestor  Hypothyroidism Synthroid  Namenda  DVT prophylaxis: heparin -> lovenox Code Status: full  Family Communication: none at bedside - sister over phone Disposition:   Status is: Inpatient  Remains inpatient appropriate because:Inpatient level of care appropriate due to severity of illness  Dispo: The patient is from: Home              Anticipated d/c is to: Home              Patient currently is not medically stable to d/c.   Difficult to place patient No       Consultants:  none  Procedures:  Echo IMPRESSIONS     1. Left ventricular ejection fraction, by estimation, is 60 to 65%. The  left ventricle has normal function. The left ventricle has no regional  wall motion abnormalities. Left ventricular diastolic parameters are  indeterminate.   2. Right ventricular systolic function is normal. The right ventricular  size is mildly enlarged. There is severely elevated pulmonary artery  systolic pressure. The estimated right ventricular systolic pressure is  00.9 mmHg.   3. The mitral valve is grossly normal. Mild to moderate mitral valve  regurgitation.   4. Tricuspid valve regurgitation is moderate to severe.   5. The aortic valve is grossly normal. Aortic valve regurgitation is not  visualized. There is Sanjuana Mruk 21 mm Magna Ease valve present in the aortic  position. Procedure Date: 06/25/2012.   6. Pulmonic valve regurgitation is moderate to severe.      Summary:  RIGHT:  - There is no evidence of deep vein thrombosis in the lower extremity.     - No cystic structure found in the popliteal fossa.     LEFT:  - Findings consistent with acute deep vein thrombosis involving the left  posterior tibial veins.  - No cystic structure found in the popliteal fossa.      Antimicrobials:  Anti-infectives (From admission, onward)    Start      Dose/Rate Route Frequency Ordered Stop   02/03/21 1000  azithromycin (ZITHROMAX) tablet 500 mg        500 mg Oral Daily 02/02/21 2250     02/03/21 0800  cefTRIAXone (ROCEPHIN) 2 g in sodium chloride 0.9 % 100 mL IVPB        2 g 200 mL/hr over 30 Minutes Intravenous Every 24 hours 02/02/21 2250     02/02/21 2145  vancomycin (VANCOREADY) IVPB 1500 mg/300 mL        1,500 mg 150 mL/hr over 120 Minutes Intravenous  Once 02/02/21 2138 02/03/21 0100   02/02/21 2130  ceFEPIme (MAXIPIME) 2 g in sodium chloride 0.9 % 100 mL IVPB        2 g 200 mL/hr over 30 Minutes Intravenous  Once 02/02/21 2129 02/02/21 2211          Subjective: Confused, disoriented  Objective: Vitals:   02/03/21 1952 02/04/21 0353 02/04/21 0500 02/04/21 0800  BP: (!) 159/67 (!) 131/48  (!) 129/55  Pulse: 72 72  68  Resp: 20  20  18  Temp: 97.6 F (36.4 C) 98.1 F (36.7 C)  98 F (36.7 C)  TempSrc: Oral Oral  Oral  SpO2: 98% 98%  98%  Weight: 69.7 kg  69.8 kg   Height: 5' 3.5" (1.613 m)       Intake/Output Summary (Last 24 hours) at 02/04/2021 0833 Last data filed at 02/04/2021 6720 Gross per 24 hour  Intake 203.89 ml  Output 450 ml  Net -246.11 ml   Filed Weights   02/02/21 1739 02/03/21 1952 02/04/21 0500  Weight: 80.3 kg 69.7 kg 69.8 kg    Examination:  General: No acute distress. Cardiovascular: RRR Lungs:  crackles bilaterally Abdomen: Soft, nontender, nondistended  Neurological: moving all extremities -> more confused today, doesn't know where she is, says her sisters upstairs Skin: Warm and dry. No rashes or lesions. Extremities: No clubbing or cyanosis. No edema.      Data Reviewed: I have personally reviewed following labs and imaging studies  CBC: Recent Labs  Lab 02/02/21 1746 02/02/21 1806 02/03/21 0704 02/04/21 0253  WBC 9.8  --  10.9* 11.2*  NEUTROABS 6.6  --  7.3 7.6  HGB 10.0* 10.5* 10.3* 10.1*  HCT 30.9* 31.0* 32.2* 32.4*  MCV 91.4  --  92.0 93.6  PLT 216  --  275 253     Basic Metabolic Panel: Recent Labs  Lab 02/02/21 1806 02/02/21 1920 02/03/21 0704 02/04/21 0253  NA 138 137 137 136  K 3.0* 3.2* 3.8 3.7  CL  --  97* 99 100  CO2  --  29 30 31   GLUCOSE  --  181* 241* 88  BUN  --  16 15 10   CREATININE  --  1.11* 1.08* 0.90  CALCIUM  --  9.0 8.6* 8.5*  MG  --   --   --  2.1  PHOS  --   --   --  2.8    GFR: Estimated Creatinine Clearance: 43.4 mL/min (by C-G formula based on SCr of 0.9 mg/dL).  Liver Function Tests: Recent Labs  Lab 02/02/21 1920 02/03/21 0704 02/04/21 0253  AST 53* 37 35  ALT 32 29 25  ALKPHOS 826* 709* 675*  BILITOT 0.5 0.6 0.7  PROT 7.0 6.4* 6.6  ALBUMIN 2.5* 2.1* 2.2*    CBG: Recent Labs  Lab 02/03/21 0755 02/03/21 0843 02/03/21 1215 02/03/21 1729 02/03/21 2019  GLUCAP 223* 243* 141* 152* 127*     Recent Results (from the past 240 hour(s))  Resp Panel by RT-PCR (Flu Ryott Rafferty&B, Covid) Nasopharyngeal Swab     Status: None   Collection Time: 02/02/21  5:42 PM   Specimen: Nasopharyngeal Swab; Nasopharyngeal(NP) swabs in vial transport medium  Result Value Ref Range Status   SARS Coronavirus 2 by RT PCR NEGATIVE NEGATIVE Final    Comment: (NOTE) SARS-CoV-2 target nucleic acids are NOT DETECTED.  The SARS-CoV-2 RNA is generally detectable in upper respiratory specimens during the acute phase of infection. The lowest concentration of SARS-CoV-2 viral copies this assay can detect is 138 copies/mL. Quinlan Vollmer negative result does not preclude SARS-Cov-2 infection and should not be used as the sole basis for treatment or other patient management decisions. Kameryn Davern negative result may occur with  improper specimen collection/handling, submission of specimen other than nasopharyngeal swab, presence of viral mutation(s) within the areas targeted by this assay, and inadequate number of viral copies(<138 copies/mL). Letishia Elliott negative result must be combined with clinical observations, patient history, and epidemiological information.  The expected result is  Negative.  Fact Sheet for Patients:  EntrepreneurPulse.com.au  Fact Sheet for Healthcare Providers:  IncredibleEmployment.be  This test is no t yet approved or cleared by the [place] FDA and  has been authorized for detection and/or diagnosis of SARS-CoV-2 by FDA under an Emergency Use Authorization (EUA). This EUA will remain  in effect (meaning this test can be used) for the duration of the COVID-19 declaration under Section 564(b)(1) of the Act, 21 U.S.C.section 360bbb-3(b)(1), unless the authorization is terminated  or revoked sooner.       Influenza Belen Zwahlen by PCR NEGATIVE NEGATIVE Final   Influenza B by PCR NEGATIVE NEGATIVE Final    Comment: (NOTE) The Xpert Xpress SARS-CoV-2/FLU/RSV plus assay is intended as an aid in the diagnosis of influenza from Nasopharyngeal swab specimens and should not be used as Ranesha Val sole basis for treatment. Nasal washings and aspirates are unacceptable for Xpert Xpress SARS-CoV-2/FLU/RSV testing.  Fact Sheet for Patients: EntrepreneurPulse.com.au  Fact Sheet for Healthcare Providers: IncredibleEmployment.be  This test is not yet approved or cleared by the [place] FDA and has been authorized for detection and/or diagnosis of SARS-CoV-2 by FDA under an Emergency Use Authorization (EUA). This EUA will remain in effect (meaning this test can be used) for the duration of the COVID-19 declaration under Section 564(b)(1) of the Act, 21 U.S.C. section 360bbb-3(b)(1), unless the authorization is terminated or revoked.  Performed at [facility], [place] [address], [place], [place] [address]   Culture, blood (routine x 2)     Status: None (Preliminary result)   Collection Time: 02/02/21  7:20 PM   Specimen: BLOOD RIGHT ARM  Result Value Ref Range Status   Specimen Description BLOOD RIGHT ARM  Final   Special Requests   Final    BOTTLES DRAWN  AEROBIC AND ANAEROBIC Blood Culture results may not be optimal due to an excessive volume of blood received in culture bottles   Culture   Final    NO GROWTH < 12 HOURS Performed at [facility], [place] [address] [place], [place] [address]    Report Status PENDING  Incomplete  Culture, blood (routine x 2)     Status: None (Preliminary result)   Collection Time: 02/02/21  7:23 PM   Specimen: BLOOD LEFT ARM  Result Value Ref Range Status   Specimen Description BLOOD LEFT ARM  Final   Special Requests   Final    BOTTLES DRAWN AEROBIC AND ANAEROBIC Blood Culture results may not be optimal due to an excessive volume of blood received in culture bottles   Culture   Final    NO GROWTH < 12 HOURS Performed at [facility], [place] [address], [place], [place] [address]    Report Status PENDING  Incomplete         Radiology Studies: CT Angio Chest PE W and/or Wo Contrast  Addendum Date: 02/02/2021   ADDENDUM REPORT: 02/02/2021 21:29 ADDENDUM: Given the longstanding nature of the nodular opacities in both lungs the possibility of metastatic disease would deserve consideration although felt to be less likely. Critical Value/emergent results were called by telephone at the time of interpretation on 02/02/2021 at 9:26 pm to Dr. Dene Gentry , who verbally acknowledged these results. Electronically Signed   By: Inez Catalina M.D.   On: 02/02/2021 21:29   Result Date: 02/02/2021 CLINICAL DATA:  Shortness of breath, positive D-dimer, initial encounter EXAM: CT ANGIOGRAPHY CHEST WITH CONTRAST TECHNIQUE: Multidetector CT imaging of the chest was  performed using the standard protocol during bolus administration of intravenous contrast. Multiplanar CT image reconstructions and MIPs were obtained to evaluate the vascular anatomy. CONTRAST:  58mL OMNIPAQUE IOHEXOL 350 MG/ML SOLN COMPARISON:  Chest x-ray from earlier in the same day. FINDINGS: Cardiovascular: Atherosclerotic calcifications of the  thoracic aorta and its branches are noted. No aneurysmal dilatation is seen. No dissection is noted. Aortic valve replacement is noted. The pulmonary artery is well visualized within normal branching pattern. There is Jeri Jeanbaptiste focal filling defect identified in the left upper lobe pulmonary artery consistent with pulmonary embolus at the segmental/subsegmental level. Mediastinum/Nodes: Thoracic inlet is within normal limits. Few scattered mediastinal and hilar lymph nodes are noted. Precarinal node measures 15 mm in short axis. Subcarinal node measures 14 mm in short axis. The esophagus as visualized demonstrates some contrast material likely related to reflux. Small hiatal hernia is noted. Lungs/Pleura: Lungs demonstrate diffuse patchy consolidation bilaterally similar to that seen on prior plain film examination. These changes likely represent multifocal pneumonia. Moderate-sized right-sided pleural effusion is noted. Tiny left effusion is seen. Upper Abdomen: Visualized upper abdomen is within normal limits. Musculoskeletal: Degenerative changes of the thoracic spine are seen. No acute rib fracture is seen. Review of the MIP images confirms the above findings. IMPRESSION: Changes consistent with left upper lobe pulmonary embolus without right heart strain Patchy bilateral consolidation consistent with multifocal pneumonia. Associated bilateral effusions are noted right greater than left as well as associated likely reactive adenopathy. Aortic Atherosclerosis (ICD10-I70.0). Electronically Signed: By: Inez Catalina M.D. On: 02/02/2021 21:23   CT ABDOMEN PELVIS W CONTRAST  Result Date: 02/03/2021 CLINICAL DATA:  Metastatic disease evaluation. EXAM: CT ABDOMEN AND PELVIS WITH CONTRAST TECHNIQUE: Multidetector CT imaging of the abdomen and pelvis was performed using the standard protocol following bolus administration of intravenous contrast. CONTRAST:  142mL OMNIPAQUE IOHEXOL 300 MG/ML  SOLN COMPARISON:  CT angiogram  chest 02/02/2021. FINDINGS: Lower chest: Small right pleural effusion and bilateral multifocal airspace opacities appear unchanged from the prior examination. Heart is mildly enlarged. Coronary artery calcifications are present. Hepatobiliary: There are numerous hypodense hepatic lesions suspicious for metastatic disease measuring up to 1.9 cm. There is geographic fatty infiltration of the liver versus perfusion abnormality. Gallstones are present. Common bile duct is dilated measuring 1 cm. There is mild intrahepatic biliary ductal dilatation. Pancreas: Ill-defined hypodensity within the region of the pancreatic head measuring 2.3 x 3.3 cm suspicious for neoplasm. There is diffuse pancreatic atrophy and ductal dilatation of the body and tail the pancreas. Spleen: Normal in size without focal abnormality. Adrenals/Urinary Tract: Adrenal glands are unremarkable. Kidneys are normal, without renal calculi, focal lesion, or hydronephrosis. Bladder is unremarkable. Stomach/Bowel: Stomach is within normal limits. Appendix is not seen. No evidence of bowel wall thickening, distention, or inflammatory changes. There is diffuse colonic diverticulosis without evidence for acute diverticulitis. Vascular/Lymphatic: Aorta is normal in size. There are atherosclerotic calcifications of the aorta. There are some prominent left periaortic lymph nodes measuring up to 9 mm. There is thrombosis of the main portal vein extending into the right main portal vein. There is also thrombosis within the portal confluence and superior mesenteric vein at the level of the pancreas. Splenic vein grossly patent. Reproductive: Small calcified uterine fibroids. Adnexa unremarkable. Other: Small amount of ascites.  Central mesenteric haziness. Musculoskeletal: No acute or significant osseous findings. IMPRESSION: 1. Low-density pancreatic head mass with distal pancreatic atrophy and ductal dilatation most compatible with primary pancreatic neoplasm.  2. Multiple hepatic lesions suspicious for  hepatic metastatic disease. 3. Gallstones are present. There is intra and extrahepatic biliary ductal dilatation, likely secondary to pancreatic head mass. 4. Thrombosis within the right portal vein, main portal vein, portal confluence and superior mesenteric vein. 5. Small volume ascites. 6. Prominent left periaortic lymph nodes. 7. Stable multifocal airspace opacities and small right pleural effusion. 8.  Aortic Atherosclerosis (ICD10-I70.0). 9. These results were called by telephone at the time of interpretation on 02/03/2021 at 3:49 pm to provider Kenzlei Runions Cephus Slater , who verbally acknowledged these results. Electronically Signed   By: Ronney Asters M.D.   On: 02/03/2021 15:50   DG Chest Port 1 View  Result Date: 02/02/2021 CLINICAL DATA:  Shortness of breath. Weakness. Poor oxygen saturation. EXAM: PORTABLE CHEST 1 VIEW COMPARISON:  12/15/2020 and multiple previous FINDINGS: Previous median sternotomy, CABG and aortic valve replacement. Worsening of widespread pulmonary opacity that could represent Tessa Seaberry combination of pneumonia and edema. There may be Javontae Marlette small amount of pleural fluid. No acute bone finding. IMPRESSION: Worsening of widespread bilateral pulmonary opacity which could be Ormond Lazo combination of edema and pneumonia. Electronically Signed   By: Nelson Chimes M.D.   On: 02/02/2021 18:06   ECHOCARDIOGRAM COMPLETE  Result Date: 02/03/2021    ECHOCARDIOGRAM REPORT   Patient Name:   [name] Date of Exam: 02/03/2021 Medical Rec #:  [id number]         Height:       63.5 in Accession #:    [id number]        Weight:       177.0 lb Date of Birth:  1932/05/23        BSA:          1.847 m Patient Age:    53 years          BP:           138/51 mmHg Patient Gender: F                 HR:           67 bpm. Exam Location:  Inpatient Procedure: 2D Echo, 3D Echo, Cardiac Doppler and Color Doppler Indications:    Dyspnea R06.00  History:        Patient has prior history of  Echocardiogram examinations, most                 recent 10/07/2020. CAD, Prior CABG; Risk Factors:Hypertension,                 Diabetes and Dyslipidemia. Multiple pulmonary nodules, Acute                 respiratory failure with hypoxia.                 Aortic Valve: 21 mm Magna Ease valve is present in the aortic                 position. Procedure Date: 06/25/2012.  Sonographer:    Darlina Sicilian RDCS Referring Phys: [place]  1. Left ventricular ejection fraction, by estimation, is 60 to 65%. The left ventricle has normal function. The left ventricle has no regional wall motion abnormalities. Left ventricular diastolic parameters are indeterminate.  2. Right ventricular systolic function is normal. The right ventricular size is mildly enlarged. There is severely elevated pulmonary artery systolic pressure. The estimated right ventricular systolic pressure is 83.6 mmHg.  3. The mitral valve is grossly normal. Mild to moderate mitral  valve regurgitation.  4. Tricuspid valve regurgitation is moderate to severe.  5. The aortic valve is grossly normal. Aortic valve regurgitation is not visualized. There is Traycen Goyer 21 mm Magna Ease valve present in the aortic position. Procedure Date: 06/25/2012.  6. Pulmonic valve regurgitation is moderate to severe. FINDINGS  Left Ventricle: Left ventricular ejection fraction, by estimation, is 60 to 65%. The left ventricle has normal function. The left ventricle has no regional wall motion abnormalities. The left ventricular internal cavity size was normal in size. There is  no left ventricular hypertrophy. Left ventricular diastolic parameters are indeterminate. Right Ventricle: The right ventricular size is mildly enlarged. Right vetricular wall thickness was not well visualized. Right ventricular systolic function is normal. There is severely elevated pulmonary artery systolic pressure. The tricuspid regurgitant velocity is 3.85 m/s, and with an assumed right atrial  pressure of 3 mmHg, the estimated right ventricular systolic pressure is 16.3 mmHg. Left Atrium: Left atrial size was normal in size. Right Atrium: Right atrial size was normal in size. Pericardium: There is no evidence of pericardial effusion. Mitral Valve: The mitral valve is grossly normal. Mild to moderate mitral valve regurgitation. Tricuspid Valve: The tricuspid valve is grossly normal. Tricuspid valve regurgitation is moderate to severe. Aortic Valve: The aortic valve is grossly normal. Aortic valve regurgitation is not visualized. Aortic valve mean gradient measures 10.5 mmHg. Aortic valve peak gradient measures 18.6 mmHg. Aortic valve area, by VTI measures 1.55 cm. There is Israel Werts 21 mm Magna Ease valve present in the aortic position. Procedure Date: 06/25/2012. Pulmonic Valve: The pulmonic valve was grossly normal. Pulmonic valve regurgitation is moderate to severe. Aorta: The aortic root and ascending aorta are structurally normal, with no evidence of dilitation. IAS/Shunts: The atrial septum is grossly normal.  LEFT VENTRICLE PLAX 2D LVOT diam:     1.90 cm   Diastology LV SV:         73        LV e' medial:    4.30 cm/s LV SV Index:   39        LV E/e' medial:  21.8 LVOT Area:     2.84 cm  LV e' lateral:   8.31 cm/s                          LV E/e' lateral: 11.3                           3D Volume EF:                          3D EF:        61 %                          LV EDV:       118 ml                          LV ESV:       46 ml                          LV SV:        72 ml RIGHT VENTRICLE RV S prime:     6.97 cm/s TAPSE (M-mode): 2.0 cm LEFT ATRIUM  Index        RIGHT ATRIUM           Index LA Vol (A2C):   53.3 ml 28.86 ml/m  RA Area:     14.40 cm LA Vol (A4C):   53.0 ml 28.70 ml/m  RA Volume:   32.10 ml  17.38 ml/m LA Biplane Vol: 54.7 ml 29.62 ml/m  AORTIC VALVE AV Area (Vmax):    1.46 cm AV Area (Vmean):   1.51 cm AV Area (VTI):     1.55 cm AV Vmax:           215.75 cm/s AV Vmean:           153.000 cm/s AV VTI:            0.469 m AV Peak Grad:      18.6 mmHg AV Mean Grad:      10.5 mmHg LVOT Vmax:         111.00 cm/s LVOT Vmean:        81.500 cm/s LVOT VTI:          0.257 m LVOT/AV VTI ratio: 0.55  AORTA Ao Root diam: 2.30 cm Ao Asc diam:  3.10 cm MITRAL VALVE                TRICUSPID VALVE MV Area (PHT): 3.50 cm     TR Peak grad:   59.3 mmHg MV Decel Time: 217 msec     TR Vmax:        385.00 cm/s MR Peak grad: 136.9 mmHg MR Mean grad: 92.0 mmHg     SHUNTS MR Vmax:      585.00 cm/s   Systemic VTI:  0.26 m MR Vmean:     465.0 cm/s    Systemic Diam: 1.90 cm MV E velocity: 93.55 cm/s MV Aren Cherne velocity: 124.00 cm/s MV E/Leiland Mihelich ratio:  0.75 Mertie Moores MD Electronically signed by Mertie Moores MD Signature Date/Time: 02/03/2021/3:44:37 PM    Final    VAS [place] LOWER EXTREMITY VENOUS (DVT)  Result Date: 02/03/2021  Lower Venous DVT Study Patient Name:  KAMEELAH MINISH  Date of Exam:   02/03/2021 Medical Rec #: [id number]          Accession #:    [id number] Date of Birth: 11-Feb-1933         Patient Gender: F Patient Age:   59 years Exam Location:  [facility] Procedure:      VAS [place] LOWER EXTREMITY VENOUS (DVT) Referring Phys: ERIC CHEN --------------------------------------------------------------------------------  Indications: Pulmonary embolism.  Risk Factors: Confirmed PE. Anticoagulation: Heparin. Limitations: Poor ultrasound/tissue interface. Comparison Study: No prior studies. Performing Technologist: Oliver Hum RVT  Examination Guidelines: Ameliana Brashear complete evaluation includes B-mode imaging, spectral Doppler, color Doppler, and power Doppler as needed of all accessible portions of each vessel. Bilateral testing is considered an integral part of Rene Sizelove complete examination. Limited examinations for reoccurring indications may be performed as noted. The reflux portion of the exam is performed with the patient in reverse Trendelenburg.   +---------+---------------+---------+-----------+----------+--------------+ RIGHT    CompressibilityPhasicitySpontaneityPropertiesThrombus Aging +---------+---------------+---------+-----------+----------+--------------+ CFV      Full           Yes      Yes                                 +---------+---------------+---------+-----------+----------+--------------+ SFJ      Full                                                        +---------+---------------+---------+-----------+----------+--------------+  FV Prox  Full                                                        +---------+---------------+---------+-----------+----------+--------------+ FV Mid   Full                                                        +---------+---------------+---------+-----------+----------+--------------+ FV DistalFull                                                        +---------+---------------+---------+-----------+----------+--------------+ PFV      Full                                                        +---------+---------------+---------+-----------+----------+--------------+ POP      Full           Yes      Yes                                 +---------+---------------+---------+-----------+----------+--------------+ PTV      Full                                                        +---------+---------------+---------+-----------+----------+--------------+ PERO     Full                                                        +---------+---------------+---------+-----------+----------+--------------+   +---------+---------------+---------+-----------+----------+--------------+ LEFT     CompressibilityPhasicitySpontaneityPropertiesThrombus Aging +---------+---------------+---------+-----------+----------+--------------+ CFV      Full           Yes      Yes                                  +---------+---------------+---------+-----------+----------+--------------+ SFJ      Full                                                        +---------+---------------+---------+-----------+----------+--------------+ FV Prox  Full                                                        +---------+---------------+---------+-----------+----------+--------------+  FV Mid   Full                                                        +---------+---------------+---------+-----------+----------+--------------+ FV DistalFull                                                        +---------+---------------+---------+-----------+----------+--------------+ PFV      Full                                                        +---------+---------------+---------+-----------+----------+--------------+ POP      Full           Yes      Yes                                 +---------+---------------+---------+-----------+----------+--------------+ PTV      None                                         Acute          +---------+---------------+---------+-----------+----------+--------------+ PERO     Full                                                        +---------+---------------+---------+-----------+----------+--------------+     Summary: RIGHT: - There is no evidence of deep vein thrombosis in the lower extremity.  - No cystic structure found in the popliteal fossa.  LEFT: - Findings consistent with acute deep vein thrombosis involving the left posterior tibial veins. - No cystic structure found in the popliteal fossa.  *See table(s) above for measurements and observations. Electronically signed by Jamelle Haring on 02/03/2021 at 2:22:28 PM.    Final         Scheduled Meds:  amLODipine  5 mg Oral Daily   azithromycin  500 mg Oral Daily   carvedilol  12.5 mg Oral BID   insulin aspart  0-15 Units Subcutaneous TID WC   insulin aspart  0-5 Units Subcutaneous QHS    insulin glargine-yfgn  10 Units Subcutaneous QHS   levothyroxine  75 mcg Oral Daily   memantine  5 mg Oral QHS   rosuvastatin  20 mg Oral Daily   Continuous Infusions:  cefTRIAXone (ROCEPHIN)  IV Stopped (02/03/21 1010)   heparin 1,050 Units/hr (02/04/21 0628)     LOS: 2 days    Time spent: over 30 min    Fayrene Helper, MD [facility]   To contact the attending provider between 7A-7P or the covering provider during after hours 7P-7A, please log into the web site [email or web address] and access using universal [place] password for that web site. If you  do not have the password, please call the hospital operator.  02/04/2021, 8:33 AM

## 2021-02-04 NOTE — Consult Note (Signed)
Consultation Note Date: 02/04/2021   Patient Name: [name]  DOB: 1932-08-31  MRN: [id number]  Age / Sex: 87 y.o., female  PCP: Rogers Blocker, MD Referring Physician: Elodia Florence., *  Reason for Consultation: Establishing goals of care and Psychosocial/spiritual support  HPI/Patient Profile: 87 y.o. female  with past medical history of on Nemenda assume memory loss, HTN/HLD, DM2 on insulin, hypothyroid admitted on 02/02/2021 with acute PE with possible metastatic pancreatic cancer.   Clinical Assessment and Goals of Care I have reviewed medical records including EPIC notes, labs and imaging, received report from RN, assessed the patient.   [name], [name], is resting quietly in bed.  She smiles, greeting me and making and mostly keeping eye contact.  She is alert, but oriented to self only.  She thinks we are in her home and that it's March.  She is calm and cooperative, able to make her basic needs known, There is no family at bedside at this time. We talk about her weight loss and she is able to say that she has lost weight, but unable to give details.  We also talk about her blood clot.  We talk aboiut using heparin to treat.    I ask about her home life.  She tells me that her husband died about 7 years ago, they had no children.  She tells me that her sister, [name], moved in to her home after her spouse passed.  [name] shares that she has 2 sisters and a brother.  Dues to her memory loss, we do not talk about any suspected cancer.  She agrees for me to call her sister to update her.   Call to sister, [name] to discuss diagnosis prognosis, [place], EOL wishes, disposition and options.  Left somewhat detailed VM message requesting return call.     Secure chat from nursing later in day stating sister is present.  Unfortunately, [name] hs left by the time I arrive in [name]'s room.  [name]  is resting comfortably and I do not wake her.   Conference with attending, bedside nursing staff and TOC related to patient condition, needs and GOC.   HCPOA  NEXT OF KIN - sister [name].  [name] tells me that her husband died about 7 years ago, they had no children.  She has 2 sister and a brother.     SUMMARY OF RECOMMENDATIONS   At this point continue full scope/code by default.  PMT unable to have meaningful discussion with patient PMT to continue to reach out to sister/HC agent.  Would benefit from out pt palliative services/hospice services.   Code Status/Advance Care Planning: Full code - by default.   Symptom Management:  Per hospitalist, no additional needs at this time.   Palliative Prophylaxis:  Frequent Pain Assessment and Oral Care  Additional Recommendations (Limitations, Scope, Preferences): Full Scope Treatment  Psycho-social/Spiritual:  Desire for further Chaplaincy support:no Additional Recommendations: Caregiving  Support/Resources and Education on Hospice  Prognosis:  Unable to determine, based  on outcomes, 3 months or less anticipated based on 40# wt loss in 1 month, suspected metastatic pancreatic cancer.   Discharge Planning:  to be determined, based on outcomes.        Primary Diagnoses: Present on Admission:  Essential hypertension  Hyperlipidemia  Hypothyroidism   I have reviewed the medical record, interviewed the patient and family, and examined the patient. The following aspects are pertinent.  Past Medical History:  Diagnosis Date   Aortic valve disorders    S/P AVR 06/2012   Arthritis    Asthma    AS CHILD    Depression    Diabetes mellitus    Heart murmur    Hyperlipidemia    Hypertension    Hypothyroidism    Ischemic heart disease    with prior CABG x 2 in 2014 along with AVR   Lumbago    Shortness of breath    Thyroid disease    Social History   Socioeconomic History   Marital status: Widowed    Spouse name:  Not on file   Number of children: Not on file   Years of education: Not on file   Highest education level: Not on file  Occupational History   Not on file  Tobacco Use   Smoking status: Never   Smokeless tobacco: Never  Vaping Use   Vaping Use: Never used  Substance and Sexual Activity   Alcohol use: No   Drug use: No   Sexual activity: Not Currently  Other Topics Concern   Not on file  Social History Narrative   Not on file   Social Determinants of Health   Financial Resource Strain: Not on file  Food Insecurity: Not on file  Transportation Needs: Not on file  Physical Activity: Not on file  Stress: Not on file  Social Connections: Not on file   Family History  Problem Relation Age of Onset   Heart disease Father    Scheduled Meds:  amLODipine  5 mg Oral Daily   azithromycin  500 mg Oral Daily   carvedilol  12.5 mg Oral BID   insulin aspart  0-15 Units Subcutaneous TID WC   insulin aspart  0-5 Units Subcutaneous QHS   insulin glargine-yfgn  10 Units Subcutaneous QHS   levothyroxine  75 mcg Oral Daily   memantine  5 mg Oral QHS   rosuvastatin  20 mg Oral Daily   Continuous Infusions:  cefTRIAXone (ROCEPHIN)  IV 2 g (02/04/21 1006)   heparin 1,050 Units/hr (02/04/21 0628)   PRN Meds:.acetaminophen **OR** acetaminophen, ondansetron **OR** ondansetron (ZOFRAN) IV Medications Prior to Admission:  Prior to Admission medications   Medication Sig Start Date End Date Taking? Authorizing Provider  amLODipine (NORVASC) 5 MG tablet Take 5 mg by mouth daily. 12/30/20  Yes [provider]  aspirin EC 81 MG tablet Take 1 tablet (81 mg total) by mouth daily. 09/10/13  Yes Nahser, Wonda Cheng, MD  calcium carbonate (TUMS - DOSED IN MG ELEMENTAL CALCIUM) 500 MG chewable tablet Chew 1 tablet by mouth daily.   Yes [provider]  carvedilol (COREG) 12.5 MG tablet Take 1 tablet (12.5 mg total) by mouth 2 (two) times daily. 10/07/20  Yes Freada Bergeron, MD   cholecalciferol (VITAMIN D) 1000 UNITS tablet Take 1,000 Units by mouth daily.   Yes [provider]  Coenzyme Q10 (CO Q-10) 100 MG CAPS Take 1 capsule by mouth daily.   Yes [provider]  furosemide (LASIX) 20  MG tablet Take 1 tablet (20 mg total) by mouth daily as needed for fluid or edema (or weight gain). 05/20/19  Yes Gerhardt, Marlane Hatcher, NP  HUMALOG KWIKPEN 100 UNIT/ML KwikPen INJECT 6 TO 8 UNITS INTO THE SKIN TWICE DAILY Patient taking differently: Inject 6 Units into the skin in the morning and at bedtime. 04/14/19  Yes Elayne Snare, MD  levothyroxine (SYNTHROID) 75 MCG tablet TAKE 1 TABLET BY MOUTH EVERY DAY 07/01/20  Yes Elayne Snare, MD  losartan (COZAAR) 50 MG tablet Take 50 mg by mouth daily. 09/28/20  Yes [provider]  memantine (NAMENDA) 5 MG tablet Take 5 mg by mouth at bedtime. 01/27/20  Yes [provider]  metFORMIN (GLUCOPHAGE-XR) 750 MG 24 hr tablet TAKE 1 TABLET BY MOUTH EVERY DAY Patient taking differently: Take 750 mg by mouth every evening. 10/11/20  Yes Elayne Snare, MD  Multiple Vitamins-Minerals (MULTIVITAMINS THER. W/MINERALS) TABS Take 1 tablet by mouth daily.   Yes [provider]  potassium chloride SA (KLOR-CON) 20 MEQ tablet Take 1 tablet (20 mEq total) by mouth 2 (two) times daily. Patient taking differently: Take 20 mEq by mouth daily. 12/14/20  Yes Elayne Snare, MD  rosuvastatin (CRESTOR) 20 MG tablet TAKE 1 TABLET BY MOUTH EVERY DAY 09/01/20  Yes Elayne Snare, MD  TOUJEO SOLOSTAR 300 UNIT/ML Solostar Pen ADMINISTER 22 UNITS UNDER THE SKIN DAILY Patient taking differently: Inject 21 Units into the skin in the morning. 10/22/20  Yes Elayne Snare, MD  vitamin B-12 (CYANOCOBALAMIN) 100 MCG tablet Take 100 mcg by mouth daily.   Yes [provider]  Accu-Chek FastClix Lancets MISC Use Accu Chek Fastclix to check blood sugar 4 times daily. 02/13/19   Elayne Snare, MD  Empagliflozin-metFORMIN HCl (SYNJARDY) 5-500 MG TABS Take 1  tablet by mouth 2 (two) times daily. Takes occassionally Patient not taking: No sig reported 11/10/20   Elayne Snare, MD  Insulin Pen Needle (B-D UF III MINI PEN NEEDLES) 31G X 5 MM MISC USE TO INJECT MEDICATION THREE TIMES DAILY 12/14/20   Elayne Snare, MD  [facility] VERIO test strip USE TO CHECK BLOOD SUGAR FOUR TIMES DAILY AS DIRECTED 03/08/20   Elayne Snare, MD   No Known Allergies Review of Systems  Unable to perform ROS: Dementia   Physical Exam Vitals and nursing note reviewed.  Constitutional:      General: She is not in acute distress.    Appearance: She is not ill-appearing.  HENT:     Head: Normocephalic and atraumatic.  Cardiovascular:     Rate and Rhythm: Normal rate.  Pulmonary:     Effort: Pulmonary effort is normal. No tachypnea.  Skin:    General: Skin is warm and dry.  Neurological:     Mental Status: She is alert.     Comments: Oriented to self only, thinks we are in her home, March  Psychiatric:     Comments: Calm and cooperative, not fearful    Vital Signs: BP (!) 129/55 (BP Location: Right Arm)   Pulse 68   Temp 98 F (36.7 C) (Oral)   Resp 18   Ht 5' 3.5" (1.613 m)   Wt 69.8 kg   SpO2 98%   BMI 26.83 kg/m  Pain Scale: 0-10   Pain Score: 0-No pain   SpO2: SpO2: 98 % O2 Device:SpO2: 98 % O2 Flow Rate: .O2 Flow Rate (L/min): 3 L/min  IO: Intake/output summary:  Intake/Output Summary (Last 24 hours) at 02/04/2021 1411 Last data filed  at 02/04/2021 1300 Gross per 24 hour  Intake 203.89 ml  Output 1450 ml  Net -1246.11 ml    LBM: Last BM Date: 02/02/21 Baseline Weight: Weight: 80.3 kg Most recent weight: Weight: 69.8 kg     Palliative Assessment/Data:   Flowsheet Rows    Flowsheet Row Most Recent Value  Intake Tab   Referral Department Hospitalist  Unit at Time of Referral Cardiac/Telemetry Unit  Palliative Care Primary Diagnosis Cancer  Date Notified 02/04/21  Palliative Care Type New Palliative care  Reason for referral Clarify Goals  of Care  Date of Admission 02/02/21  Date first seen by Palliative Care 02/04/21  # of days Palliative referral response time 0 Day(s)  # of days IP prior to Palliative referral 2  Clinical Assessment   Palliative Performance Scale Score 30%  Pain Max last 24 hours Not able to report  Pain Min Last 24 hours Not able to report  Dyspnea Max Last 24 Hours Not able to report  Dyspnea Min Last 24 hours Not able to report  Psychosocial & Spiritual Assessment   Palliative Care Outcomes        Time In: 1330  Time Out: 1420 Time Total: 50 minutes  Greater than 50%  of this time was spent counseling and coordinating care related to the above assessment and plan.  Signed by: Drue Novel, NP   Please contact Palliative Medicine Team phone at [phone] for questions and concerns.  For individual provider: See Shea Evans

## 2021-02-04 NOTE — Progress Notes (Signed)
PT woke up twice during the night confused on where she was and call her sister both times. Pt believes she is home and wants her sister to pick her up , she also believes "staff" is trying to hurt her. Pt frequently reoriented throughout the night. She has called 2 different family members and 911 requesting "help". Sister asked for "memory medication" to be restarted, RN reviewed home meds and pt noted with Namenda. RN will pass on to ask day MD to restart home med according to professional judgement.   RN remained in room with pt and played music to her liking for calmness and distraction. RN unable to obtain personal cell phone as pt will not release it from her hands or voluntarily give it up. Pt made attempt x1 to get OOB, but easily redirected.

## 2021-02-04 NOTE — Progress Notes (Addendum)
Received verbal order from Dr. Florene Glen  to D/C tele and O2 monitoring due to pt fighting and refusing to wear.

## 2021-02-04 NOTE — Progress Notes (Signed)
ANTICOAGULATION CONSULT NOTE  Pharmacy Consult for Heparin Indication: pulmonary embolus  No Known Allergies  Patient Measurements: Height: 5' 3.5" (161.3 cm) Weight: 69.8 kg (153 lb 14.1 oz) IBW/kg (Calculated) : 53.55  Heparin Dosing Weight: 70.9 kg  Vital Signs: Temp: 98 F (36.7 C) (10/07 0800) Temp Source: Oral (10/07 0800) BP: 129/55 (10/07 0800) Pulse Rate: 68 (10/07 0800)  Labs: Recent Labs    02/02/21 1746 02/02/21 1806 02/02/21 1920 02/03/21 0704 02/03/21 2013 02/04/21 0253  HGB 10.0* 10.5*  --  10.3*  --  10.1*  HCT 30.9* 31.0*  --  32.2*  --  32.4*  PLT 216  --   --  275  --  253  LABPROT  --   --  15.3*  --   --   --   INR  --   --  1.2  --   --   --   HEPARINUNFRC  --   --   --  >1.10* 0.42 0.60  CREATININE  --   --  1.11* 1.08*  --  0.90  TROPONINIHS  --   --  37*  --   --   --     Estimated Creatinine Clearance: 43.4 mL/min (by C-G formula based on SCr of 0.9 mg/dL).   Medications:  Scheduled:   amLODipine  5 mg Oral Daily   azithromycin  500 mg Oral Daily   carvedilol  12.5 mg Oral BID   insulin aspart  0-15 Units Subcutaneous TID WC   insulin aspart  0-5 Units Subcutaneous QHS   insulin glargine-yfgn  10 Units Subcutaneous QHS   levothyroxine  75 mcg Oral Daily   memantine  5 mg Oral QHS   rosuvastatin  20 mg Oral Daily   Infusions:   cefTRIAXone (ROCEPHIN)  IV 2 g (02/04/21 1006)   heparin 1,050 Units/hr (02/04/21 3662)    Assessment: [name] presented with shortness of breath and fatigue. CT of chest demonstrated left upper lobe PE. CXR shows multiple nodules concerning for metastatic disease and shows moderate right-sided pleural effusion. Pharmacy consulted to manage heparin infusion.    Heparin infusion started at 1200 units/hr with x1 4000 unit bolus. Resulting heparin level elevated at > 1.1. Heparin was held for x1 hour and resumed and decreased rate of 1050 units/hr. Following heparin level was 0.42, therapeutic. Per RN no  bleeding noted and no issues IV infusion or access. CBC stable.    Goal of Therapy:  Heparin level 0.3-0.7 units/ml Monitor platelets by anticoagulation protocol: Yes   Plan:  Continue heparin infusion at 1050 units/hr Check heparin level daily while on heparin Continue to monitor H&H and platelets   Thank you for allowing pharmacy to be a part of this patient's care.  Ardyth Harps, PharmD Clinical Pharmacist

## 2021-02-05 ENCOUNTER — Encounter (HOSPITAL_COMMUNITY): Payer: Self-pay | Admitting: Internal Medicine

## 2021-02-05 DIAGNOSIS — C259 Malignant neoplasm of pancreas, unspecified: Secondary | ICD-10-CM

## 2021-02-05 DIAGNOSIS — I2699 Other pulmonary embolism without acute cor pulmonale: Principal | ICD-10-CM

## 2021-02-05 DIAGNOSIS — Z7901 Long term (current) use of anticoagulants: Secondary | ICD-10-CM

## 2021-02-05 DIAGNOSIS — C7800 Secondary malignant neoplasm of unspecified lung: Secondary | ICD-10-CM | POA: Diagnosis not present

## 2021-02-05 DIAGNOSIS — C787 Secondary malignant neoplasm of liver and intrahepatic bile duct: Secondary | ICD-10-CM | POA: Diagnosis not present

## 2021-02-05 DIAGNOSIS — Z7189 Other specified counseling: Secondary | ICD-10-CM

## 2021-02-05 HISTORY — DX: Other specified counseling: Z71.89

## 2021-02-05 HISTORY — DX: Malignant neoplasm of pancreas, unspecified: C25.9

## 2021-02-05 LAB — COMPREHENSIVE METABOLIC PANEL
ALT: 25 U/L (ref 0–44)
AST: 41 U/L (ref 15–41)
Albumin: 2.1 g/dL — ABNORMAL LOW (ref 3.5–5.0)
Alkaline Phosphatase: 700 U/L — ABNORMAL HIGH (ref 38–126)
Anion gap: 4 — ABNORMAL LOW (ref 5–15)
BUN: 12 mg/dL (ref 8–23)
CO2: 35 mmol/L — ABNORMAL HIGH (ref 22–32)
Calcium: 8.5 mg/dL — ABNORMAL LOW (ref 8.9–10.3)
Chloride: 100 mmol/L (ref 98–111)
Creatinine, Ser: 0.88 mg/dL (ref 0.44–1.00)
GFR, Estimated: >60 mL/min (ref >60)
Glucose, Bld: 73 mg/dL (ref 70–99)
Potassium: 4.1 mmol/L (ref 3.5–5.1)
Sodium: 139 mmol/L (ref 135–145)
Total Bilirubin: 0.7 mg/dL (ref 0.3–1.2)
Total Protein: 6.3 g/dL — ABNORMAL LOW (ref 6.5–8.1)

## 2021-02-05 LAB — CBC WITH DIFFERENTIAL/PLATELET
Abs Immature Granulocytes: 0.04 10*3/uL (ref 0.00–0.07)
Basophils Absolute: 0.1 10*3/uL (ref 0.0–0.1)
Basophils Relative: 1 %
Eosinophils Absolute: 0.3 10*3/uL (ref 0.0–0.5)
Eosinophils Relative: 3 %
HCT: 29.7 % — ABNORMAL LOW (ref 36.0–46.0)
Hemoglobin: 9.3 g/dL — ABNORMAL LOW (ref 12.0–15.0)
Immature Granulocytes: 0 %
Lymphocytes Relative: 15 %
Lymphs Abs: 1.5 10*3/uL (ref 0.7–4.0)
MCH: 29 pg (ref 26.0–34.0)
MCHC: 31.3 g/dL (ref 30.0–36.0)
MCV: 92.5 fL (ref 80.0–100.0)
Monocytes Absolute: 0.8 10*3/uL (ref 0.1–1.0)
Monocytes Relative: 8 %
Neutro Abs: 7.6 10*3/uL (ref 1.7–7.7)
Neutrophils Relative %: 73 %
Platelets: 240 10*3/uL (ref 150–400)
RBC: 3.21 MIL/uL — ABNORMAL LOW (ref 3.87–5.11)
RDW: 17.1 % — ABNORMAL HIGH (ref 11.5–15.5)
WBC: 10.4 10*3/uL (ref 4.0–10.5)
nRBC: 0 % (ref 0.0–0.2)

## 2021-02-05 LAB — GLUCOSE, CAPILLARY
Glucose-Capillary: 154 mg/dL — ABNORMAL HIGH (ref 70–99)
Glucose-Capillary: 126 mg/dL — ABNORMAL HIGH (ref 70–99)
Glucose-Capillary: 110 mg/dL — ABNORMAL HIGH (ref 70–99)
Glucose-Capillary: 198 mg/dL — ABNORMAL HIGH (ref 70–99)

## 2021-02-05 LAB — PHOSPHORUS: Phosphorus: 2.6 mg/dL (ref 2.5–4.6)

## 2021-02-05 LAB — MAGNESIUM: Magnesium: 2.2 mg/dL (ref 1.7–2.4)

## 2021-02-05 LAB — MRSA NEXT GEN BY PCR, NASAL: MRSA by PCR Next Gen: NOT DETECTED

## 2021-02-05 MED ORDER — RIVAROXABAN 15 MG PO TABS
15.0000 mg | ORAL_TABLET | Freq: Two times a day (BID) | ORAL | Status: DC
  Administered 2021-02-05 – 2021-02-08 (×7): 15 mg via ORAL
  Filled 2021-02-05 (×8): qty 1

## 2021-02-05 MED ORDER — FUROSEMIDE 10 MG/ML IJ SOLN
20.0000 mg | Freq: Two times a day (BID) | INTRAMUSCULAR | Status: DC
  Administered 2021-02-05 – 2021-02-06 (×2): 20 mg via INTRAVENOUS
  Filled 2021-02-05 (×2): qty 2

## 2021-02-05 MED ORDER — ENSURE ENLIVE PO LIQD
237.0000 mL | Freq: Two times a day (BID) | ORAL | Status: DC
  Administered 2021-02-05 – 2021-02-08 (×3): 237 mL via ORAL

## 2021-02-05 MED ORDER — FUROSEMIDE 10 MG/ML IJ SOLN
20.0000 mg | Freq: Once | INTRAMUSCULAR | Status: AC
  Administered 2021-02-05: 20 mg via INTRAVENOUS
  Filled 2021-02-05: qty 2

## 2021-02-05 MED ORDER — ADULT MULTIVITAMIN W/MINERALS CH
1.0000 | ORAL_TABLET | Freq: Every day | ORAL | Status: DC
  Administered 2021-02-05 – 2021-02-08 (×4): 1 via ORAL
  Filled 2021-02-05 (×4): qty 1

## 2021-02-05 MED ORDER — FUROSEMIDE 10 MG/ML IJ SOLN
20.0000 mg | Freq: Once | INTRAMUSCULAR | Status: DC
Start: 2021-02-05 — End: 2021-02-05

## 2021-02-05 MED ORDER — RIVAROXABAN 20 MG PO TABS: 20.0000 mg | ORAL_TABLET | Freq: Every day | ORAL | Status: DC

## 2021-02-05 NOTE — Progress Notes (Signed)
Initial Nutrition Assessment  DOCUMENTATION CODES:   Not applicable  INTERVENTION:  -Recommend liberalizing diet to regular to optimize PO intake -Ensure Enlive po BID, each supplement provides 350 kcal and 20 grams of protein -MVI with minerals daily  NUTRITION DIAGNOSIS:   Unintentional weight loss related to chronic illness as evidenced by per patient/family report, percent weight loss (pt reports 40-50 lb wt loss x 3 last weeks).  GOAL:   Patient will meet greater than or equal to 90% of their needs  MONITOR:   Labs, PO intake, Supplement acceptance, Weight trends, I & O's  REASON FOR ASSESSMENT:   Malnutrition Screening Tool    ASSESSMENT:   Pt with PMH significant for HTN, type 2 DM, HLD, hypothyroidism, and ? Memory deficits was admitted with AMS, acute pulmonary embolism, acute hypoxic respiratory failure, and concern for metastatic pancreatic cancer.  Pt alert, but oriented to self only.  Per H&P, pt reports ~40-50lb weight loss over the last 3 weeks. This weight loss cannot be verified via weight readings available in chart, however, wt readings show pt weighed 74.4 kg on 08/17/20 and 68.6 kg on 02/05/21. This indicates a 7.8% wt loss x6 months, which is insignificant for time frame. Unable to obtain any other meaningful information at this time. Will attempt to obtain diet/wt history and NFPE upon follow-up.  No PO intake documented.   Medications:  amLODipine  5 mg Oral Daily   azithromycin  500 mg Oral Daily   carvedilol  12.5 mg Oral BID   enoxaparin (LOVENOX) injection  1 mg/kg Subcutaneous Q12H   furosemide  20 mg Intravenous BID   furosemide  20 mg Intravenous Once   insulin aspart  0-15 Units Subcutaneous TID WC   insulin aspart  0-5 Units Subcutaneous QHS   insulin glargine-yfgn  10 Units Subcutaneous QHS   levothyroxine  75 mcg Oral Daily   memantine  5 mg Oral QHS   rosuvastatin  20 mg Oral Daily  Labs: Recent Labs  Lab 02/03/21 0704  02/04/21 0253 02/05/21 0142  NA 137 136 139  K 3.8 3.7 4.1  CL 99 100 100  CO2 30 31 35*  BUN 15 10 12   CREATININE 1.08* 0.90 0.88  CALCIUM 8.6* 8.5* 8.5*  MG  --  2.1 2.2  PHOS  --  2.8 2.6  GLUCOSE 241* 88 73  CBGs 85-152-103-154  UOP: 1.4L x24 hours I/O: -1.2L since admit  NUTRITION - FOCUSED PHYSICAL EXAM: Unable to perform at this time as RD is working remotely. Will attempt at follow-up.   Diet Order:   Diet Order             Diet heart healthy/carb modified Room service appropriate? Yes; Fluid consistency: Thin  Diet effective now                   EDUCATION NEEDS:   No education needs have been identified at this time  Skin:  Skin Assessment: Reviewed RN Assessment  Last BM:  10/5  Height:   Ht Readings from Last 1 Encounters:  02/03/21 5' 3.5" (1.613 m)    Weight:   Wt Readings from Last 10 Encounters:  02/05/21 68.6 kg  12/14/20 69.9 kg  11/10/20 71.5 kg  09/08/20 73 kg  08/17/20 74.4 kg  06/15/20 76.8 kg  05/20/20 79 kg  02/12/20 76.9 kg  02/10/20 77.9 kg  11/10/19 80.1 kg    BMI:  Body mass index is 26.37 kg/m.  Estimated Nutritional  Needs:   Kcal:  1700-1900  Protein:  85-95 grams  Fluid:  >1.7L/d    Larkin Ina, MS, RD, LDN (she/her/hers) RD pager number and weekend/on-call pager number located in [place].

## 2021-02-05 NOTE — Progress Notes (Signed)
Palliative:  [name], Cornick, is resting quietly in bed with nursing staff at bedside.  She greets me, smiling and making and mostly keeping eye contact.  She appears elderly and somewhat frail.  She is alert, but oriented to self only, thinking we are in Hospice.  She is able to make her basic needs known at this time.  There is no family at bedside at this time.    We talk about her night.  She tells me that she didn't sleep well.  We talk about her cough, blood clots in lungs and the treatment plan.  PT arrives to eval and assist to bedside chair.  Conference with attending who shares that family will be arriving soon.   I return later in the morning to find sister, [name], and stepdaughter Teidi (Heidi with a T) at bedside.  We talk in detail about [name]'s acute health concerns, oncology consult, GOC.  We talk about [name]'s right to say what this time looks like and feels like. We talk in detail about options including PT eval and recommendations, STR vs home with [facility], vs home with "treat the treatable Hospice care"  We talk about hospice provider choice and, when ready, they would choose [facility]. Teidi shares that they will support [name]'s choices, but want to make sure she is informed.   Family shares their concerns about caring for [name] at home.  We talk about working closely with hospice for any services and calling on family.  We talk about preferred place of death. [name] states that although she has not considered it, she feels safe in a hospital.  I share that she would also receive great care in a residential hospice.   Cnference with attending, bedside nursing staff, oncology consult and TOC team related to patient condition, needs, GOC, disposition.   Plan:  Continue to treat the treatable, but no CPR or intubation.  Step daughter would like oncology consult. Considering home with [facility] vs Home with "treat the treatable" hospice care.    65 minutes, extended time  [name], NO Palliative Medicine Team  Team Phone [phone] Greater than 50% of this time was spent counseling and coordinating care related to the above assessment and plan .

## 2021-02-05 NOTE — Consult Note (Signed)
Referral MD  Reason for Referral: Metastatic pancreatic cancer; pulm embolism  Chief Complaint  Patient presents with   Shortness of Breath   Weakness  : I have cancer.  HPI: [name] is a very charming 87 year old African-American female.  She is originally from [place].  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  She has been down in [place] for over 20 years.  She does have diabetes..  She has been losing weight over the past several months.  She has lost about 30 pounds.  She says she just does not have much of an appetite.  She was admitted on 02/02/2021.  She has shortness of breath.  She was found to have a pulmonary embolism.  As part of the work-up, she had a CT angiogram done.  This did show I get some pulmonary nodules.  She had an CT of the abdomen and pelvis which showed a pancreatic mass and liver metastasis.  We are asked to see her so that we could decide what the best course of action would be.  She is not a candidate for any systemic therapy.  Her overall performance status is ECOG 3.  I think the risk far outweigh the benefits of chemotherapy for her.  She is having no problems with pain.  She says she just does not have an appetite.  Has had no bleeding.  She had a little bit of diarrhea.  There is been no fever.  She had echocardiogram done on 02/03/2021.  This showed moderate to severe tricuspid regurgitation.  She did have a ejection fraction of 60 to 65%.  Her blood work shows white cell count of 10.4.  Hemoglobin 9.3.  Platelet count 240,000.  Her BUN is 12 creatinine 0.88.  Blood sugar 73.  Her albumin is 2.1.  Her family was with her when I was talking to her.    Past Medical History:  Diagnosis Date   Aortic valve disorders    S/P AVR 06/2012   Arthritis    Asthma    AS CHILD    Depression    Diabetes mellitus    Heart murmur    Hyperlipidemia    Hypertension    Hypothyroidism    Ischemic heart disease    with  prior CABG x 2 in 2014 along with AVR   Lumbago    Shortness of breath    Thyroid disease   :   Past Surgical History:  Procedure Laterality Date   AORTIC VALVE REPLACEMENT N/A 06/25/2012   Procedure: AORTIC VALVE REPLACEMENT (AVR);  Surgeon: Grace Isaac, MD;  Location: [facility];  Service: Open Heart Surgery;  Laterality: N/A;   BREAST BIOPSY Right 08/2016   benign   BREAST SURGERY     LT BREAST BX BENIGN   CORONARY ARTERY BYPASS GRAFT N/A 06/25/2012   Procedure: Coronary artery bypass graft  times two using left internal mammary artery and right leg greater saphenous vein;  Surgeon: Grace Isaac, MD;  Location: [facility];  Service: Open Heart Surgery;  Laterality: N/A;   INTRAOPERATIVE TRANSESOPHAGEAL ECHOCARDIOGRAM N/A 06/25/2012   Procedure: INTRAOPERATIVE TRANSESOPHAGEAL ECHOCARDIOGRAM;  Surgeon: Grace Isaac, MD;  Location: [facility];  Service: Open Heart Surgery;  Laterality: N/A;   TUBAL LIGATION    :   Current Facility-Administered Medications:    acetaminophen (TYLENOL) tablet 650 mg, 650 mg, Oral, Q6H PRN **OR** acetaminophen (TYLENOL) suppository 650 mg, 650 mg, Rectal, Q6H PRN, Kristopher Oppenheim,  DO   amLODipine (NORVASC) tablet 5 mg, 5 mg, Oral, Daily, Florene Glen, A Clint Lipps., MD   azithromycin [facility]) tablet 500 mg, 500 mg, Oral, Daily, Kristopher Oppenheim, DO, 500 mg at 02/05/21 0949   carvedilol (COREG) tablet 12.5 mg, 12.5 mg, Oral, BID, Elodia Florence., MD, 12.5 mg at 02/05/21 1000   cefTRIAXone (ROCEPHIN) 2 g in sodium chloride 0.9 % 100 mL IVPB, 2 g, Intravenous, Q24H, Kristopher Oppenheim, DO, Last Rate: 200 mL/hr at 02/05/21 0946, 2 g at 02/05/21 0946   enoxaparin (LOVENOX) injection 70 mg, 1 mg/kg, Subcutaneous, Q12H, Ralph Dowdy, RPH, 70 mg at 02/05/21 1829   feeding supplement (ENSURE ENLIVE / ENSURE PLUS) liquid 237 mL, 237 mL, Oral, BID BM, Elodia Florence., MD, 237 mL at 02/05/21 1448   furosemide (LASIX) injection 20 mg, 20 mg, Intravenous, BID, Elodia Florence., MD, 20 mg at 02/05/21 1200   furosemide (LASIX) injection 20 mg, 20 mg, Intravenous, Once, Elodia Florence., MD   haloperidol lactate (HALDOL) injection 2 mg, 2 mg, Intravenous, Q6H PRN, Dove, Tasha A, NP   insulin aspart (novoLOG) injection 0-15 Units, 0-15 Units, Subcutaneous, TID WC, Kristopher Oppenheim, DO, 2 Units at 02/05/21 1236   insulin aspart (novoLOG) injection 0-5 Units, 0-5 Units, Subcutaneous, QHS, Kristopher Oppenheim, DO   insulin glargine-yfgn [facility]) injection 10 Units, 10 Units, Subcutaneous, QHS, Kristopher Oppenheim, DO, 10 Units at 02/04/21 2112   levothyroxine (SYNTHROID) tablet 75 mcg, 75 mcg, Oral, Daily, Elodia Florence., MD, 75 mcg at 02/05/21 9371   memantine (NAMENDA) tablet 5 mg, 5 mg, Oral, QHS, Elodia Florence., MD, 5 mg at 02/04/21 2111   multivitamin with minerals tablet 1 tablet, 1 tablet, Oral, Daily, Elodia Florence., MD, 1 tablet at 02/05/21 1448   ondansetron (ZOFRAN) tablet 4 mg, 4 mg, Oral, Q6H PRN **OR** ondansetron (ZOFRAN) injection 4 mg, 4 mg, Intravenous, Q6H PRN, Kristopher Oppenheim, DO   rosuvastatin (CRESTOR) tablet 20 mg, 20 mg, Oral, Daily, Elodia Florence., MD, 20 mg at 02/05/21 6967:   amLODipine  5 mg Oral Daily   azithromycin  500 mg Oral Daily   carvedilol  12.5 mg Oral BID   enoxaparin (LOVENOX) injection  1 mg/kg Subcutaneous Q12H   feeding supplement  237 mL Oral BID BM   furosemide  20 mg Intravenous BID   furosemide  20 mg Intravenous Once   insulin aspart  0-15 Units Subcutaneous TID WC   insulin aspart  0-5 Units Subcutaneous QHS   insulin glargine-yfgn  10 Units Subcutaneous QHS   levothyroxine  75 mcg Oral Daily   memantine  5 mg Oral QHS   multivitamin with minerals  1 tablet Oral Daily   rosuvastatin  20 mg Oral Daily  :  No Known Allergies:   Family History  Problem Relation Age of Onset   Heart disease Father   :   Social History   Socioeconomic History   Marital status: Widowed    Spouse name: Not  on file   Number of children: Not on file   Years of education: Not on file   Highest education level: Not on file  Occupational History   Not on file  Tobacco Use   Smoking status: Never   Smokeless tobacco: Never  Vaping Use   Vaping Use: Never used  Substance and Sexual Activity   Alcohol use: No   Drug use: No   Sexual activity:  Not Currently  Other Topics Concern   Not on file  Social History Narrative   Not on file   Social Determinants of Health   Financial Resource Strain: Not on file  Food Insecurity: Not on file  Transportation Needs: Not on file  Physical Activity: Not on file  Stress: Not on file  Social Connections: Not on file  Intimate Partner Violence: Not on file  :  Review of Systems  Constitutional:  Positive for malaise/fatigue and weight loss.  HENT: Negative.    Eyes: Negative.   Respiratory:  Positive for shortness of breath.   Cardiovascular:  Positive for chest pain and palpitations.  Gastrointestinal:  Positive for diarrhea.  Genitourinary: Negative.   Musculoskeletal: Negative.   Skin: Negative.   Neurological: Negative.   Endo/Heme/Allergies: Negative.   Psychiatric/Behavioral: Negative.      Exam: Patient Vitals for the past 24 hrs:  BP Temp Temp src Pulse Resp SpO2 Weight  02/05/21 1000 (!) 109/92 -- -- 66 -- -- --  02/05/21 0619 (!) 116/49 98.2 F (36.8 C) Oral 64 19 98 % --  02/05/21 0500 -- -- -- -- -- -- 151 lb 3.8 oz (68.6 kg)  02/04/21 2105 (!) 159/71 99.3 F (37.4 C) Oral 70 19 100 % --   Physical Exam Vitals reviewed.  HENT:     Head: Normocephalic and atraumatic.  Eyes:     Pupils: Pupils are equal, round, and reactive to light.  Cardiovascular:     Rate and Rhythm: Normal rate and regular rhythm.     Heart sounds: Normal heart sounds.  Pulmonary:     Effort: Pulmonary effort is normal.     Breath sounds: Normal breath sounds.  Abdominal:     General: Bowel sounds are normal.     Palpations: Abdomen is soft.   Musculoskeletal:        General: No tenderness or deformity. Normal range of motion.     Cervical back: Normal range of motion.  Lymphadenopathy:     Cervical: No cervical adenopathy.  Skin:    General: Skin is warm and dry.     Findings: No erythema or rash.  Neurological:     Mental Status: She is alert and oriented to person, place, and time.  Psychiatric:        Behavior: Behavior normal.        Thought Content: Thought content normal.        Judgment: Judgment normal.      Recent Labs    02/04/21 0253 02/05/21 0142  WBC 11.2* 10.4  HGB 10.1* 9.3*  HCT 32.4* 29.7*  PLT 253 240    Recent Labs    02/04/21 0253 02/05/21 0142  NA 136 139  K 3.7 4.1  CL 100 100  CO2 31 35*  GLUCOSE 88 73  BUN 10 12  CREATININE 0.90 0.88  CALCIUM 8.5* 8.5*    Blood smear review: None  Pathology: None    Assessment and Plan: [name] is a very charming 87 year old African-American female.  She has stage IV pancreatic cancer.  She has liver metastasis.  I suppose she has lung metastasis.  She has a pulmonary embolism.  Again, she is not a candidate for systemic therapy.  I really suspect that her prognosis probably will be no more than 3-4 weeks.  She is lost weight.  She is not eating.  She has not had active.  I will check a prealbumin on her.  This certainly could  help [place] with the prognosis.  I do believe that she would be a very good candidate for [facility].  This actually is close to where she lives.  There has been some family members who have been there before in the family has been pleased with the care.  I do think that she would be a good candidate for [facility].  I think that she will have comfort.  I think she will have wonderful care.  Her family can be with her and not have to worry about being her nurse.  I think that she would be agreeable to this.  I will have to call [facility] on Monday to see what the bed situation is like.  As far as  anticoagulation, I would just probably get her on Xarelto.  This is daily.  I think this would be reasonable.  I know that this is not the cheapest option but it certainly would be a very amenable option for her.  [name] is incredibly delightful.  I really had a nice time talking with her.  I answered all of her questions.  I think that she is quite aware of her limited survival.  She is okay with this.  Our goal clearly is comfort and quality of life.  Lattie Haw, MD  2 Timothy 4:6-8

## 2021-02-05 NOTE — Plan of Care (Signed)

## 2021-02-05 NOTE — Progress Notes (Signed)
PROGRESS NOTE    [name]  HCW:[id number] DOB: 03/31/1933 DOA: 02/02/2021 PCP: Jody Blocker, MD   Chief Complaint  Patient presents with   Shortness of Breath   Weakness   Brief Narrative:  Very sweet 87 year old African-American female with [name] history of hypertension, type 2 diabetes on insulin, hyperlipidemia, acquired hypothyroidism presents to the ER today for the PCP office.  Patient noted to be hypoxic in the office.  Patient arrived with room air saturations of 85%.  With ambulation her sats dropped to 60%.  Patient states that over the last 3 weeks she has noticed increasing dyspnea and cough.  She took some cough medicine.  She thought that she was initially getting better.  Over the last 2 weeks, she has felt worse.  She is bringing up clear-colored sputum.  She is only had 1 episode that was mildly blood-tinged.  She is increasingly dyspneic with just minimal exertion.  Patient is notes approximately 40-50 pound weight loss over the last 3 weeks.  She states she mentioned this to her PCP.  In the ER, patient noted to be on admission.  Patient placed initially on nonrebreather and then decreased to 2 L [name] minute.  Laboratory work-up demonstrated BNP of 351.  D-dimer was positive for 0.87.  Chemistry showed [name] sodium 137 potassium 3.2 chloride of 29 bicarbonate of 16 been of 1.1 glucose of 181.  CBC showed [name] white count of 9.8, hemoglobin 10.0 platelets of 216.  ABG on 2 L oxygen showed pH 7.43 PCO2 47 PO2 of 61.  Patient's COVID-negative.  CTPA demonstrated left upper lobe pulmonary embolism.  There are multiple pulmonary nodules that were also seen on her chest x-ray and August 2022 and May 2022.  She had mediastinal adenopathy.  There is concern for metastatic disease.  She has Modesta Sammons moderate sized right-sided pleural effusion.   Patient lives in an apartment with her 52 year old sister.  Due to the patient's pulmonary embolism, acute hypoxic respiratory failure, concern for  metastatic lung disease, [facility] hospitalist contacted for admission.   Assessment & Plan:   Principal Problem:   Acute pulmonary embolism (HCC) Active Problems:   Essential hypertension   Hyperlipidemia   Hypothyroidism   Type 2 diabetes mellitus without complication (HCC)   Acute respiratory failure with hypoxia (HCC)   Multiple pulmonary nodules - with concern for metastatic disease   S/P AVR (aortic valve replacement) - 06/25/2012   Pleural effusion on right  Goals of care Improved mental status this morning.  Discussed code status, she notes "I've had Evian Derringer good life" she would not want things like CPR/intubation, etc.  We discussed her metastatic cancer, discussed options including oncology follow up/biopsy or focus on comfort.  [name] notes that she's had Tao Satz good life and it sounds like she would not want oncology follow up.  I'd like to talk about this decision with family and palliative care.  Called sister, [name] this AM.  She put [name], patient's [name] on the phone as well.  Discussed what [name] told me this morning, they were hoping to be here when I told [name] about her diagnosis (and disappointed).  Prognosis hard to predict, respiratory status is concerning to me.  Acute Metabolic Encephalopathy Delirium related to hospitalization, pneumonia, etc - she's on namenda (no hx of dementia in chart, ?cognitive deficit) Follow venous blood gas - mildly elevated Pco2 with normal pH Continue to treat PE, pneumonia Delirium precautions W/u additionally as indicated  Left Upper Lobe Pulmonary Embolus  Portal Vein Thrombosis  Superior Mesenteric Vein Thrombosis 2/2 metastatic cancer Heparin gtt -> lovenox for now Echo with normal RVSF, moderate to severe TV, PV regurg moderate to severe LE [place] with acute DVT of L posterior tibial veins  Metastatic Cancer Likely pancreatic Will need to discuss with patient and determine how aggressive she wants to be with  workup/treatment - limited by delirium -> but some clarity today, she states she wouldn't want oncology follow up.  I think discussion with palliative care will be important.  Multifocal Pneumonia Continue ceftriaxone/azithromycin -> plan for 5 days CXR 10/7 with dense bilateral patchy airspace disease Continue lasix, ? Component of edema/overload Continue to monitor  Acute hypoxic Respiratory Failure Multiple reasons being treated above  Severely elevated PASP  Moderate to Severe TV Regurgitation  Mild to Moderate MV Regurgitation  Moderate to Severe PV Regurgitation  Pyuria Follow culture  HTN coreg  T2DM SSI, basal 10 units Continue to monitor  HLD crestor  Hypothyroidism Synthroid  Namenda  DVT prophylaxis: heparin -> lovenox Code Status: full  Family Communication: none at bedside - sister/[name] over phone Disposition:   Status is: Inpatient  Remains inpatient appropriate because:Inpatient level of care appropriate due to severity of illness  Dispo: The patient is from: Home              Anticipated d/c is to: Home              Patient currently is not medically stable to d/c.   Difficult to place patient No       Consultants:  none  Procedures:  Echo IMPRESSIONS     1. Left ventricular ejection fraction, by estimation, is 60 to 65%. The  left ventricle has normal function. The left ventricle has no regional  wall motion abnormalities. Left ventricular diastolic parameters are  indeterminate.   2. Right ventricular systolic function is normal. The right ventricular  size is mildly enlarged. There is severely elevated pulmonary artery  systolic pressure. The estimated right ventricular systolic pressure is  16.1 mmHg.   3. The mitral valve is grossly normal. Mild to moderate mitral valve  regurgitation.   4. Tricuspid valve regurgitation is moderate to severe.   5. The aortic valve is grossly normal. Aortic valve regurgitation is not   visualized. There is Vernona Peake 21 mm Magna Ease valve present in the aortic  position. Procedure Date: 06/25/2012.   6. Pulmonic valve regurgitation is moderate to severe.      Summary:  RIGHT:  - There is no evidence of deep vein thrombosis in the lower extremity.     - No cystic structure found in the popliteal fossa.     LEFT:  - Findings consistent with acute deep vein thrombosis involving the left  posterior tibial veins.  - No cystic structure found in the popliteal fossa.      Antimicrobials:  Anti-infectives (From admission, onward)    Start     Dose/Rate Route Frequency Ordered Stop   02/03/21 1000  azithromycin (ZITHROMAX) tablet 500 mg        500 mg Oral Daily 02/02/21 2250     02/03/21 0800  cefTRIAXone (ROCEPHIN) 2 g in sodium chloride 0.9 % 100 mL IVPB        2 g 200 mL/hr over 30 Minutes Intravenous Every 24 hours 02/02/21 2250     02/02/21 2145  vancomycin (VANCOREADY) IVPB 1500 mg/300 mL  1,500 mg 150 mL/hr over 120 Minutes Intravenous  Once 02/02/21 2138 02/03/21 0100   02/02/21 2130  ceFEPIme (MAXIPIME) 2 g in sodium chloride 0.9 % 100 mL IVPB        2 g 200 mL/hr over 30 Minutes Intravenous  Once 02/02/21 2129 02/02/21 2211      Subjective: More alert this morning Able to have Aranza Geddes better discdussion about her diagnosis She notes she's had Shyne Lehrke good life Doesn't want things like CPR/intubation Says she wouldn't want oncology follow up either   Objective: Vitals:   02/04/21 0800 02/04/21 2105 02/05/21 0500 02/05/21 0619  BP: (!) 129/55 (!) 159/71  (!) 116/49  Pulse: 68 70  64  Resp: 18 19  19   Temp: 98 F (36.7 C) 99.3 F (37.4 C)  98.2 F (36.8 C)  TempSrc: Oral Oral  Oral  SpO2: 98% 100%  98%  Weight:   68.6 kg   Height:        Intake/Output Summary (Last 24 hours) at 02/05/2021 0938 Last data filed at 02/05/2021 0000 Gross per 24 hour  Intake 282.47 ml  Output 1400 ml  Net -1117.53 ml   Filed Weights   02/03/21 1952 02/04/21 0500  02/05/21 0500  Weight: 69.7 kg 69.8 kg 68.6 kg    Examination:  General: No acute distress. Cardiovascular: RRR Lungs: crackles bilaterally Abdomen: Soft, nontender, nondistended Neurological: Alert, more clear than yesterday.  Able to have more complex discussion today. Moves all extremities 4. Cranial nerves II through XII grossly intact. Skin: Warm and dry. No rashes or lesions. Extremities: No clubbing or cyanosis. No edema.   Data Reviewed: I have personally reviewed following labs and imaging studies  CBC: Recent Labs  Lab 02/02/21 1746 02/02/21 1806 02/03/21 0704 02/04/21 0253 02/05/21 0142  WBC 9.8  --  10.9* 11.2* 10.4  NEUTROABS 6.6  --  7.3 7.6 7.6  HGB 10.0* 10.5* 10.3* 10.1* 9.3*  HCT 30.9* 31.0* 32.2* 32.4* 29.7*  MCV 91.4  --  92.0 93.6 92.5  PLT 216  --  275 253 852    Basic Metabolic Panel: Recent Labs  Lab 02/02/21 1806 02/02/21 1920 02/03/21 0704 02/04/21 0253 02/05/21 0142  NA 138 137 137 136 139  K 3.0* 3.2* 3.8 3.7 4.1  CL  --  97* 99 100 100  CO2  --  29 30 31  35*  GLUCOSE  --  181* 241* 88 73  BUN  --  16 15 10 12   CREATININE  --  1.11* 1.08* 0.90 0.88  CALCIUM  --  9.0 8.6* 8.5* 8.5*  MG  --   --   --  2.1 2.2  PHOS  --   --   --  2.8 2.6    GFR: Estimated Creatinine Clearance: 44 mL/min (by C-G formula based on SCr of 0.88 mg/dL).  Liver Function Tests: Recent Labs  Lab 02/02/21 1920 02/03/21 0704 02/04/21 0253 02/05/21 0142  AST 53* 37 35 41  ALT 32 29 25 25   ALKPHOS 826* 709* 675* 700*  BILITOT 0.5 0.6 0.7 0.7  PROT 7.0 6.4* 6.6 6.3*  ALBUMIN 2.5* 2.1* 2.2* 2.1*    CBG: Recent Labs  Lab 02/03/21 2019 02/04/21 1159 02/04/21 1732 02/04/21 2102 02/05/21 0745  GLUCAP 127* 85 152* 103* 154*     Recent Results (from the past 240 hour(s))  Resp Panel by RT-PCR (Flu Brantley Wiley&B, Covid) Nasopharyngeal Swab     Status: None   Collection Time: 02/02/21  5:42  PM   Specimen: Nasopharyngeal Swab; Nasopharyngeal(NP) swabs in  vial transport medium  Result Value Ref Range Status   SARS Coronavirus 2 by RT PCR NEGATIVE NEGATIVE Final    Comment: (NOTE) SARS-CoV-2 target nucleic acids are NOT DETECTED.  The SARS-CoV-2 RNA is generally detectable in upper respiratory specimens during the acute phase of infection. The lowest concentration of SARS-CoV-2 viral copies this assay can detect is 138 copies/mL. Jiali Linney negative result does not preclude SARS-Cov-2 infection and should not be used as the sole basis for treatment or other patient management decisions. Jacia Sickman negative result may occur with  improper specimen collection/handling, submission of specimen other than nasopharyngeal swab, presence of viral mutation(s) within the areas targeted by this assay, and inadequate number of viral copies(<138 copies/mL). Jaana Brodt negative result must be combined with clinical observations, patient history, and epidemiological information. The expected result is Negative.  Fact Sheet for Patients:  EntrepreneurPulse.com.au  Fact Sheet for Healthcare Providers:  IncredibleEmployment.be  This test is no t yet approved or cleared by the [place] FDA and  has been authorized for detection and/or diagnosis of SARS-CoV-2 by FDA under an Emergency Use Authorization (EUA). This EUA will remain  in effect (meaning this test can be used) for the duration of the COVID-19 declaration under Section 564(b)(1) of the Act, 21 U.S.C.section 360bbb-3(b)(1), unless the authorization is terminated  or revoked sooner.       Influenza Julieann Drummonds by PCR NEGATIVE NEGATIVE Final   Influenza B by PCR NEGATIVE NEGATIVE Final    Comment: (NOTE) The Xpert Xpress SARS-CoV-2/FLU/RSV plus assay is intended as an aid in the diagnosis of influenza from Nasopharyngeal swab specimens and should not be used as Samari Bittinger sole basis for treatment. Nasal washings and aspirates are unacceptable for Xpert Xpress SARS-CoV-2/FLU/RSV testing.  Fact  Sheet for Patients: EntrepreneurPulse.com.au  Fact Sheet for Healthcare Providers: IncredibleEmployment.be  This test is not yet approved or cleared by the [place] FDA and has been authorized for detection and/or diagnosis of SARS-CoV-2 by FDA under an Emergency Use Authorization (EUA). This EUA will remain in effect (meaning this test can be used) for the duration of the COVID-19 declaration under Section 564(b)(1) of the Act, 21 U.S.C. section 360bbb-3(b)(1), unless the authorization is terminated or revoked.  Performed at [facility], [place] [address], [place], [place] [address]   Culture, blood (routine x 2)     Status: None (Preliminary result)   Collection Time: 02/02/21  7:20 PM   Specimen: BLOOD RIGHT ARM  Result Value Ref Range Status   Specimen Description BLOOD RIGHT ARM  Final   Special Requests   Final    BOTTLES DRAWN AEROBIC AND ANAEROBIC Blood Culture results may not be optimal due to an excessive volume of blood received in culture bottles   Culture   Final    NO GROWTH 2 DAYS Performed at [facility], [place] [address] [place], [place] [address]    Report Status PENDING  Incomplete  Culture, blood (routine x 2)     Status: None (Preliminary result)   Collection Time: 02/02/21  7:23 PM   Specimen: BLOOD LEFT ARM  Result Value Ref Range Status   Specimen Description BLOOD LEFT ARM  Final   Special Requests   Final    BOTTLES DRAWN AEROBIC AND ANAEROBIC Blood Culture results may not be optimal due to an excessive volume of blood received in culture bottles   Culture   Final    NO  GROWTH 2 DAYS Performed at [facility], [place] [address] [place], [place] [address]    Report Status PENDING  Incomplete         Radiology Studies: CT ABDOMEN PELVIS W CONTRAST  Result Date: 02/03/2021 CLINICAL DATA:  Metastatic disease evaluation. EXAM: CT ABDOMEN AND PELVIS WITH CONTRAST TECHNIQUE: Multidetector CT  imaging of the abdomen and pelvis was performed using the standard protocol following bolus administration of intravenous contrast. CONTRAST:  182mL OMNIPAQUE IOHEXOL 300 MG/ML  SOLN COMPARISON:  CT angiogram chest 02/02/2021. FINDINGS: Lower chest: Small right pleural effusion and bilateral multifocal airspace opacities appear unchanged from the prior examination. Heart is mildly enlarged. Coronary artery calcifications are present. Hepatobiliary: There are numerous hypodense hepatic lesions suspicious for metastatic disease measuring up to 1.9 cm. There is geographic fatty infiltration of the liver versus perfusion abnormality. Gallstones are present. Common bile duct is dilated measuring 1 cm. There is mild intrahepatic biliary ductal dilatation. Pancreas: Ill-defined hypodensity within the region of the pancreatic head measuring 2.3 x 3.3 cm suspicious for neoplasm. There is diffuse pancreatic atrophy and ductal dilatation of the body and tail the pancreas. Spleen: Normal in size without focal abnormality. Adrenals/Urinary Tract: Adrenal glands are unremarkable. Kidneys are normal, without renal calculi, focal lesion, or hydronephrosis. Bladder is unremarkable. Stomach/Bowel: Stomach is within normal limits. Appendix is not seen. No evidence of bowel wall thickening, distention, or inflammatory changes. There is diffuse colonic diverticulosis without evidence for acute diverticulitis. Vascular/Lymphatic: Aorta is normal in size. There are atherosclerotic calcifications of the aorta. There are some prominent left periaortic lymph nodes measuring up to 9 mm. There is thrombosis of the main portal vein extending into the right main portal vein. There is also thrombosis within the portal confluence and superior mesenteric vein at the level of the pancreas. Splenic vein grossly patent. Reproductive: Small calcified uterine fibroids. Adnexa unremarkable. Other: Small amount of ascites.  Central mesenteric haziness.  Musculoskeletal: No acute or significant osseous findings. IMPRESSION: 1. Low-density pancreatic head mass with distal pancreatic atrophy and ductal dilatation most compatible with primary pancreatic neoplasm. 2. Multiple hepatic lesions suspicious for hepatic metastatic disease. 3. Gallstones are present. There is intra and extrahepatic biliary ductal dilatation, likely secondary to pancreatic head mass. 4. Thrombosis within the right portal vein, main portal vein, portal confluence and superior mesenteric vein. 5. Small volume ascites. 6. Prominent left periaortic lymph nodes. 7. Stable multifocal airspace opacities and small right pleural effusion. 8.  Aortic Atherosclerosis (ICD10-I70.0). 9. These results were called by telephone at the time of interpretation on 02/03/2021 at 3:49 pm to provider Kaysha Parsell Cephus Slater , who verbally acknowledged these results. Electronically Signed   By: Ronney Asters M.D.   On: 02/03/2021 15:50   DG CHEST PORT 1 VIEW  Result Date: 02/04/2021 CLINICAL DATA:  Short of breath EXAM: PORTABLE CHEST 1 VIEW COMPARISON:  02/02/2021 FINDINGS: Sternotomy wires overlies stable cardiac silhouette. There is dense patchy bilateral airspace disease not improved. No pneumothorax. Minimal effusions. IMPRESSION: No interval change in dense bilateral patchy airspace disease Electronically Signed   By: Suzy Bouchard M.D.   On: 02/04/2021 13:17   ECHOCARDIOGRAM COMPLETE  Result Date: 02/03/2021    ECHOCARDIOGRAM REPORT   Patient Name:   [name] Date of Exam: 02/03/2021 Medical Rec #:  [id number]         Height:       63.5 in Accession #:    [id number]  Weight:       177.0 lb Date of Birth:  21-Oct-1932        BSA:          1.847 m Patient Age:    59 years          BP:           138/51 mmHg Patient Gender: F                 HR:           67 bpm. Exam Location:  Inpatient Procedure: 2D Echo, 3D Echo, Cardiac Doppler and Color Doppler Indications:    Dyspnea R06.00  History:         Patient has prior history of Echocardiogram examinations, most                 recent 10/07/2020. CAD, Prior CABG; Risk Factors:Hypertension,                 Diabetes and Dyslipidemia. Multiple pulmonary nodules, Acute                 respiratory failure with hypoxia.                 Aortic Valve: 21 mm Magna Ease valve is present in the aortic                 position. Procedure Date: 06/25/2012.  Sonographer:    Darlina Sicilian RDCS Referring Phys: Gordon  1. Left ventricular ejection fraction, by estimation, is 60 to 65%. The left ventricle has normal function. The left ventricle has no regional wall motion abnormalities. Left ventricular diastolic parameters are indeterminate.  2. Right ventricular systolic function is normal. The right ventricular size is mildly enlarged. There is severely elevated pulmonary artery systolic pressure. The estimated right ventricular systolic pressure is 10.2 mmHg.  3. The mitral valve is grossly normal. Mild to moderate mitral valve regurgitation.  4. Tricuspid valve regurgitation is moderate to severe.  5. The aortic valve is grossly normal. Aortic valve regurgitation is not visualized. There is Kalicia Dufresne 21 mm Magna Ease valve present in the aortic position. Procedure Date: 06/25/2012.  6. Pulmonic valve regurgitation is moderate to severe. FINDINGS  Left Ventricle: Left ventricular ejection fraction, by estimation, is 60 to 65%. The left ventricle has normal function. The left ventricle has no regional wall motion abnormalities. The left ventricular internal cavity size was normal in size. There is  no left ventricular hypertrophy. Left ventricular diastolic parameters are indeterminate. Right Ventricle: The right ventricular size is mildly enlarged. Right vetricular wall thickness was not well visualized. Right ventricular systolic function is normal. There is severely elevated pulmonary artery systolic pressure. The tricuspid regurgitant velocity is 3.85 m/s, and  with an assumed right atrial pressure of 3 mmHg, the estimated right ventricular systolic pressure is 58.5 mmHg. Left Atrium: Left atrial size was normal in size. Right Atrium: Right atrial size was normal in size. Pericardium: There is no evidence of pericardial effusion. Mitral Valve: The mitral valve is grossly normal. Mild to moderate mitral valve regurgitation. Tricuspid Valve: The tricuspid valve is grossly normal. Tricuspid valve regurgitation is moderate to severe. Aortic Valve: The aortic valve is grossly normal. Aortic valve regurgitation is not visualized. Aortic valve mean gradient measures 10.5 mmHg. Aortic valve peak gradient measures 18.6 mmHg. Aortic valve area, by VTI measures 1.55 cm. There is Cyanna Neace 21 mm Magna Ease valve present in the aortic  position. Procedure Date: 06/25/2012. Pulmonic Valve: The pulmonic valve was grossly normal. Pulmonic valve regurgitation is moderate to severe. Aorta: The aortic root and ascending aorta are structurally normal, with no evidence of dilitation. IAS/Shunts: The atrial septum is grossly normal.  LEFT VENTRICLE PLAX 2D LVOT diam:     1.90 cm   Diastology LV SV:         73        LV e' medial:    4.30 cm/s LV SV Index:   39        LV E/e' medial:  21.8 LVOT Area:     2.84 cm  LV e' lateral:   8.31 cm/s                          LV E/e' lateral: 11.3                           3D Volume EF:                          3D EF:        61 %                          LV EDV:       118 ml                          LV ESV:       46 ml                          LV SV:        72 ml RIGHT VENTRICLE RV S prime:     6.97 cm/s TAPSE (M-mode): 2.0 cm LEFT ATRIUM             Index        RIGHT ATRIUM           Index LA Vol (A2C):   53.3 ml 28.86 ml/m  RA Area:     14.40 cm LA Vol (A4C):   53.0 ml 28.70 ml/m  RA Volume:   32.10 ml  17.38 ml/m LA Biplane Vol: 54.7 ml 29.62 ml/m  AORTIC VALVE AV Area (Vmax):    1.46 cm AV Area (Vmean):   1.51 cm AV Area (VTI):     1.55 cm AV Vmax:            215.75 cm/s AV Vmean:          153.000 cm/s AV VTI:            0.469 m AV Peak Grad:      18.6 mmHg AV Mean Grad:      10.5 mmHg LVOT Vmax:         111.00 cm/s LVOT Vmean:        81.500 cm/s LVOT VTI:          0.257 m LVOT/AV VTI ratio: 0.55  AORTA Ao Root diam: 2.30 cm Ao Asc diam:  3.10 cm MITRAL VALVE                TRICUSPID VALVE MV Area (PHT): 3.50 cm     TR Peak grad:   59.3 mmHg MV Decel Time: 217 msec     TR Vmax:  385.00 cm/s MR Peak grad: 136.9 mmHg MR Mean grad: 92.0 mmHg     SHUNTS MR Vmax:      585.00 cm/s   Systemic VTI:  0.26 m MR Vmean:     465.0 cm/s    Systemic Diam: 1.90 cm MV E velocity: 93.55 cm/s MV Danile Trier velocity: 124.00 cm/s MV E/Glorianne Proctor ratio:  0.75 Mertie Moores MD Electronically signed by Mertie Moores MD Signature Date/Time: 02/03/2021/3:44:37 PM    Final    VAS [place] LOWER EXTREMITY VENOUS (DVT)  Result Date: 02/03/2021  Lower Venous DVT Study Patient Name:  [name]  Date of Exam:   02/03/2021 Medical Rec #: [id number]          Accession #:    [id number] Date of Birth: 11/15/1932         Patient Gender: F Patient Age:   9 years Exam Location:  [facility] Procedure:      VAS [place] LOWER EXTREMITY VENOUS (DVT) Referring Phys: ERIC CHEN --------------------------------------------------------------------------------  Indications: Pulmonary embolism.  Risk Factors: Confirmed PE. Anticoagulation: Heparin. Limitations: Poor ultrasound/tissue interface. Comparison Study: No prior studies. Performing Technologist: Oliver Hum RVT  Examination Guidelines: Byran Bilotti complete evaluation includes B-mode imaging, spectral Doppler, color Doppler, and power Doppler as needed of all accessible portions of each vessel. Bilateral testing is considered an integral part of Brendon Christoffel complete examination. Limited examinations for reoccurring indications may be performed as noted. The reflux portion of the exam is performed with the patient in reverse Trendelenburg.   +---------+---------------+---------+-----------+----------+--------------+ RIGHT    CompressibilityPhasicitySpontaneityPropertiesThrombus Aging +---------+---------------+---------+-----------+----------+--------------+ CFV      Full           Yes      Yes                                 +---------+---------------+---------+-----------+----------+--------------+ SFJ      Full                                                        +---------+---------------+---------+-----------+----------+--------------+ FV Prox  Full                                                        +---------+---------------+---------+-----------+----------+--------------+ FV Mid   Full                                                        +---------+---------------+---------+-----------+----------+--------------+ FV DistalFull                                                        +---------+---------------+---------+-----------+----------+--------------+ PFV      Full                                                        +---------+---------------+---------+-----------+----------+--------------+  POP      Full           Yes      Yes                                 +---------+---------------+---------+-----------+----------+--------------+ PTV      Full                                                        +---------+---------------+---------+-----------+----------+--------------+ PERO     Full                                                        +---------+---------------+---------+-----------+----------+--------------+   +---------+---------------+---------+-----------+----------+--------------+ LEFT     CompressibilityPhasicitySpontaneityPropertiesThrombus Aging +---------+---------------+---------+-----------+----------+--------------+ CFV      Full           Yes      Yes                                  +---------+---------------+---------+-----------+----------+--------------+ SFJ      Full                                                        +---------+---------------+---------+-----------+----------+--------------+ FV Prox  Full                                                        +---------+---------------+---------+-----------+----------+--------------+ FV Mid   Full                                                        +---------+---------------+---------+-----------+----------+--------------+ FV DistalFull                                                        +---------+---------------+---------+-----------+----------+--------------+ PFV      Full                                                        +---------+---------------+---------+-----------+----------+--------------+ POP      Full           Yes      Yes                                 +---------+---------------+---------+-----------+----------+--------------+  PTV      None                                         Acute          +---------+---------------+---------+-----------+----------+--------------+ PERO     Full                                                        +---------+---------------+---------+-----------+----------+--------------+     Summary: RIGHT: - There is no evidence of deep vein thrombosis in the lower extremity.  - No cystic structure found in the popliteal fossa.  LEFT: - Findings consistent with acute deep vein thrombosis involving the left posterior tibial veins. - No cystic structure found in the popliteal fossa.  *See table(s) above for measurements and observations. Electronically signed by Jamelle Haring on 02/03/2021 at 2:22:28 PM.    Final         Scheduled Meds:  amLODipine  5 mg Oral Daily   azithromycin  500 mg Oral Daily   carvedilol  12.5 mg Oral BID   enoxaparin (LOVENOX) injection  1 mg/kg Subcutaneous Q12H   insulin aspart  0-15 Units  Subcutaneous TID WC   insulin aspart  0-5 Units Subcutaneous QHS   insulin glargine-yfgn  10 Units Subcutaneous QHS   levothyroxine  75 mcg Oral Daily   memantine  5 mg Oral QHS   rosuvastatin  20 mg Oral Daily   Continuous Infusions:  cefTRIAXone (ROCEPHIN)  IV 2 g (02/04/21 1006)     LOS: 3 days    Time spent: over 30 min    Fayrene Helper, MD [facility]   To contact the attending provider between 7A-7P or the covering provider during after hours 7P-7A, please log into the web site [email or web address] and access using universal [place] password for that web site. If you do not have the password, please call the hospital operator.  02/05/2021, 9:38 AM

## 2021-02-05 NOTE — Evaluation (Signed)
Physical Therapy Evaluation Patient Details Name: [name] MRN: [id number] DOB: 09/23/1932 Today's Date: 02/05/2021  History of Present Illness  87 year old African-American female admitted 10/5 who presents to the ER from the PCP office.  Patient noted to be hypoxic in the office.  Patient arrived to ER with room air saturations of 85%.  With ambulation her sats dropped to 60%. Pt found to have acute PE as well as questionable metastatic lung Cancer.  Pt has been confused as well. Palliative care following pt. PMH:  HTN, type 2 diabetes on insulin, hyperlipidemia, acquired hypothyroidism  Clinical Impression  Pt admitted with above diagnosis. Pt was able to ambulate with RW with good safety overall. Pt does desaturate and was not on home O2 prior to admit. Sister is 18 years old and is caregiver to pt.  Need to determine if sister can care for pt.  Will follow acutely.  Pt currently with functional limitations due to the deficits listed below (see PT Problem List). Pt will benefit from skilled PT to increase their independence and safety with mobility to allow discharge to the venue listed below.          Recommendations for follow up therapy are one component of a multi-disciplinary discharge planning process, led by the attending physician.  Recommendations may be updated based on patient status, additional functional criteria and insurance authorization.  Follow Up Recommendations Home health PT    Equipment Recommendations  None recommended by PT    Recommendations for Other Services       Precautions / Restrictions Precautions Precautions: Fall Restrictions Weight Bearing Restrictions: No      Mobility  Bed Mobility Overal bed mobility: Independent                  Transfers Overall transfer level: Needs assistance Equipment used: Rolling walker (2 wheeled) Transfers: Sit to/from Stand Sit to Stand: Min guard         General transfer comment: Cues for  hand placement to stand.  Assisted to 3N1 with pt needing assist to wipe when finished urinating.  Ambulation/Gait Ambulation/Gait assistance: Min guard Gait Distance (Feet): 45 Feet Assistive device: Rolling walker (2 wheeled)     Gait velocity interpretation: <1.31 ft/sec, indicative of household ambulator General Gait Details: Pt was able to ambulate with RW with min gaurd assist. Sats at rest on 3L 95%.  Desat to 81% on 3L with activity.  Sats recovered once pt sitting to 90% with 3L.  Pt with good safetywith RW overall and no LOB with RW.  Stairs            Wheelchair Mobility    Modified Rankin (Stroke Patients Only)       Balance Overall balance assessment: Needs assistance Sitting-balance support: No upper extremity supported;Feet supported Sitting balance-Leahy Scale: Fair     Standing balance support: Bilateral upper extremity supported;During functional activity Standing balance-Leahy Scale: Fair Standing balance comment: can stand statically without the RW but does better with the rW for support                             Pertinent Vitals/Pain Pain Assessment: No/denies pain    Home Living Family/patient expects to be discharged to:: Private residence Living Arrangements: Other relatives (sister) Available Help at Discharge: Family;Available 24 hours/day Type of Home: House Home Access: Stairs to enter Entrance Stairs-Rails: Right Entrance Stairs-Number of Steps: 2 Home Layout: One level Home  Equipment: Gilford Rile - 4 wheels;Shower seat;Hand held shower head;Grab bars - tub/shower      Prior Function Level of Independence: Independent with assistive device(s)         Comments: used rollator at times per pt     Hand Dominance   Dominant Hand: Right    Extremity/Trunk Assessment   Upper Extremity Assessment Upper Extremity Assessment: Defer to OT evaluation    Lower Extremity Assessment Lower Extremity Assessment: Generalized  weakness    Cervical / Trunk Assessment Cervical / Trunk Assessment: Kyphotic  Communication   Communication: No difficulties  Cognition Arousal/Alertness: Awake/alert Behavior During Therapy: Flat affect Overall Cognitive Status: Impaired/Different from baseline Area of Impairment: Orientation;Following commands;Safety/judgement;Awareness;Problem solving;Memory                     Memory: Decreased short-term memory       Problem Solving: Requires verbal cues        General Comments      Exercises     Assessment/Plan    PT Assessment Patient needs continued PT services  PT Problem List Decreased activity tolerance;Decreased balance;Decreased mobility;Decreased knowledge of use of DME;Decreased safety awareness;Decreased knowledge of precautions       PT Treatment Interventions DME instruction;Gait training;Functional mobility training;Therapeutic activities;Therapeutic exercise;Balance training;Patient/family education;Stair training    PT Goals (Current goals can be found in the Care Plan section)  Acute Rehab PT Goals Patient Stated Goal: to go home PT Goal Formulation: With patient Time For Goal Achievement: 02/19/21 Potential to Achieve Goals: Good    Frequency Min 3X/week   Barriers to discharge        Co-evaluation               AM-PAC PT "6 Clicks" Mobility  Outcome Measure Help needed turning from your back to your side while in a flat bed without using bedrails?: None Help needed moving from lying on your back to sitting on the side of a flat bed without using bedrails?: None Help needed moving to and from a bed to a chair (including a wheelchair)?: A Little Help needed standing up from a chair using your arms (e.g., wheelchair or bedside chair)?: A Little Help needed to walk in hospital room?: A Little Help needed climbing 3-5 steps with a railing? : A Little 6 Click Score: 20    End of Session Equipment Utilized During Treatment:  Gait belt;Oxygen Activity Tolerance: Patient limited by fatigue Patient left: in chair;with call bell/phone within reach;with chair alarm set Nurse Communication: Mobility status PT Visit Diagnosis: Muscle weakness (generalized) (M62.81)    Time: 1000-1028 PT Time Calculation (min) (ACUTE ONLY): 28 min   Charges:   PT Evaluation $PT Eval Moderate Complexity: 1 Mod PT Treatments $Gait Training: 8-22 mins        Arsalan Brisbin M,PT Acute Rehab Services [phone] (pager)   Alvira Philips 02/05/2021, 11:54 AM

## 2021-02-06 LAB — CBC WITH DIFFERENTIAL/PLATELET
Abs Immature Granulocytes: 0.03 10*3/uL (ref 0.00–0.07)
Basophils Absolute: 0.1 10*3/uL (ref 0.0–0.1)
Basophils Relative: 1 %
Eosinophils Absolute: 0.4 10*3/uL (ref 0.0–0.5)
Eosinophils Relative: 4 %
HCT: 30.4 % — ABNORMAL LOW (ref 36.0–46.0)
Hemoglobin: 9.4 g/dL — ABNORMAL LOW (ref 12.0–15.0)
Immature Granulocytes: 0 %
Lymphocytes Relative: 15 %
Lymphs Abs: 1.6 10*3/uL (ref 0.7–4.0)
MCH: 29.1 pg (ref 26.0–34.0)
MCHC: 30.9 g/dL (ref 30.0–36.0)
MCV: 94.1 fL (ref 80.0–100.0)
Monocytes Absolute: 0.8 10*3/uL (ref 0.1–1.0)
Monocytes Relative: 8 %
Neutro Abs: 7.3 10*3/uL (ref 1.7–7.7)
Neutrophils Relative %: 72 %
Platelets: 235 10*3/uL (ref 150–400)
RBC: 3.23 MIL/uL — ABNORMAL LOW (ref 3.87–5.11)
RDW: 17.2 % — ABNORMAL HIGH (ref 11.5–15.5)
WBC: 10.1 10*3/uL (ref 4.0–10.5)
nRBC: 0 % (ref 0.0–0.2)

## 2021-02-06 LAB — GLUCOSE, CAPILLARY
Glucose-Capillary: 94 mg/dL (ref 70–99)
Glucose-Capillary: 95 mg/dL (ref 70–99)
Glucose-Capillary: 190 mg/dL — ABNORMAL HIGH (ref 70–99)
Glucose-Capillary: 178 mg/dL — ABNORMAL HIGH (ref 70–99)

## 2021-02-06 LAB — COMPREHENSIVE METABOLIC PANEL
ALT: 27 U/L (ref 0–44)
AST: 54 U/L — ABNORMAL HIGH (ref 15–41)
Albumin: 2.1 g/dL — ABNORMAL LOW (ref 3.5–5.0)
Alkaline Phosphatase: 718 U/L — ABNORMAL HIGH (ref 38–126)
Anion gap: 8 (ref 5–15)
BUN: 13 mg/dL (ref 8–23)
CO2: 36 mmol/L — ABNORMAL HIGH (ref 22–32)
Calcium: 8.7 mg/dL — ABNORMAL LOW (ref 8.9–10.3)
Chloride: 96 mmol/L — ABNORMAL LOW (ref 98–111)
Creatinine, Ser: 0.91 mg/dL (ref 0.44–1.00)
GFR, Estimated: >60 mL/min (ref >60)
Glucose, Bld: 130 mg/dL — ABNORMAL HIGH (ref 70–99)
Potassium: 3.5 mmol/L (ref 3.5–5.1)
Sodium: 140 mmol/L (ref 135–145)
Total Bilirubin: 0.6 mg/dL (ref 0.3–1.2)
Total Protein: 6.6 g/dL (ref 6.5–8.1)

## 2021-02-06 LAB — PREALBUMIN: Prealbumin: <5 mg/dL — ABNORMAL LOW (ref 18–38)

## 2021-02-06 LAB — MAGNESIUM: Magnesium: 2.1 mg/dL (ref 1.7–2.4)

## 2021-02-06 LAB — PHOSPHORUS: Phosphorus: 2.4 mg/dL — ABNORMAL LOW (ref 2.5–4.6)

## 2021-02-06 MED ORDER — FUROSEMIDE 20 MG PO TABS: 20.0000 mg | ORAL_TABLET | Freq: Every day | ORAL | Status: DC

## 2021-02-06 MED ORDER — FUROSEMIDE 10 MG/ML IJ SOLN: 20.0000 mg | Freq: Every day | INTRAMUSCULAR | Status: DC

## 2021-02-06 MED ORDER — FUROSEMIDE 20 MG PO TABS
20.0000 mg | ORAL_TABLET | Freq: Every day | ORAL | Status: DC
  Administered 2021-02-07 – 2021-02-08 (×2): 20 mg via ORAL
  Filled 2021-02-06 (×2): qty 1

## 2021-02-06 NOTE — Progress Notes (Addendum)
PROGRESS NOTE    [name]  KVQ:[id number] DOB: Jan 21, 1933 DOA: 02/02/2021 PCP: Rogers Blocker, MD   Chief Complaint  Patient presents with   Shortness of Breath   Weakness   Brief Narrative:  Very sweet 87 year old African-American female with Sher Shampine history of hypertension, type 2 diabetes on insulin, hyperlipidemia, acquired hypothyroidism presents to the ER today for the PCP office.  Patient noted to be hypoxic in the office.  Patient arrived with room air saturations of 85%.  With ambulation her sats dropped to 60%.  Patient states that over the last 3 weeks she has noticed increasing dyspnea and cough.  She took some cough medicine.  She thought that she was initially getting better.  Over the last 2 weeks, she has felt worse.  She is bringing up clear-colored sputum.  She is only had 1 episode that was mildly blood-tinged.  She is increasingly dyspneic with just minimal exertion.  Patient is notes approximately 40-50 pound weight loss over the last 3 weeks.  She states she mentioned this to her PCP.  In the ER, patient noted to be on admission.  Patient placed initially on nonrebreather and then decreased to 2 L [place] minute.  Laboratory work-up demonstrated BNP of 351.  D-dimer was positive for 0.87.  Chemistry showed Warrick Llera sodium 137 potassium 3.2 chloride of 29 bicarbonate of 16 been of 1.1 glucose of 181.  CBC showed Marija Calamari white count of 9.8, hemoglobin 10.0 platelets of 216.  ABG on 2 L oxygen showed pH 7.43 PCO2 47 PO2 of 61.  Patient's COVID-negative.  CTPA demonstrated left upper lobe pulmonary embolism.  There are multiple pulmonary nodules that were also seen on her chest x-ray and August 2022 and May 2022.  She had mediastinal adenopathy.  There is concern for metastatic disease.  She has Kortney Potvin moderate sized right-sided pleural effusion.   Patient lives in an apartment with her 85 year old sister.  Due to the patient's pulmonary embolism, acute hypoxic respiratory failure, concern for  metastatic lung disease, [facility] contacted for admission.   Assessment & Plan:   Principal Problem:   Acute pulmonary embolism (HCC) Active Problems:   Essential hypertension   Hyperlipidemia   Hypothyroidism   Type 2 diabetes mellitus without complication (HCC)   Acute respiratory failure with hypoxia (HCC)   Multiple pulmonary nodules - with concern for metastatic disease   S/P AVR (aortic valve replacement) - 06/25/2012   Pleural effusion on right   Pancreatic cancer metastasized to liver [facility])   Goals of care, counseling/discussion  Goals of care Planning for hospice after discussions with palliative and oncology.  Will see about inpatient hospice.  Acute Metabolic Encephalopathy  Delirium Delirium related to hospitalization, pneumonia, etc - she's on namenda (no hx of dementia in chart, ?cognitive deficit) Follow venous blood gas - mildly elevated Pco2 with normal pH Continue to treat PE, pneumonia Delirium precautions - continued delirium W/u additionally as indicated  Left Upper Lobe Pulmonary Embolus  L Posterior Tibial DVT Portal Vein Thrombosis  Superior Mesenteric Vein Thrombosis 2/2 metastatic cancer Xarelto per oncology Echo with normal RVSF, moderate to severe TV, PV regurg moderate to severe LE [place] with acute DVT of L posterior tibial veins  Metastatic Cancer Likely pancreatic Planning for hospice   Multifocal Pneumonia Continue ceftriaxone/azithromycin -> plan for 5 days CXR 10/7 with dense bilateral patchy airspace disease Continue lasix, ? Component of edema/overload -> transition to oral lasix tomorrow, developing alkalosis Continue to monitor  Acute hypoxic  Respiratory Failure Multiple reasons being treated above  Severely elevated PASP  Moderate to Severe TV Regurgitation  Mild to Moderate MV Regurgitation  Moderate to Severe PV Regurgitation  Pyuria Follow culture  HTN coreg  T2DM SSI, basal 10 units Continue to  monitor  HLD crestor  Hypothyroidism Synthroid  Namenda  DVT prophylaxis: heparin -> lovenox -> xarelto Code Status: full  Family Communication: son and sister at bedside Disposition:   Status is: Inpatient  Remains inpatient appropriate because:Inpatient level of care appropriate due to severity of illness  Dispo: The patient is from: Home              Anticipated d/c is to: Home              Patient currently is not medically stable to d/c.   Difficult to place patient No       Consultants:  none  Procedures:  Echo IMPRESSIONS     1. Left ventricular ejection fraction, by estimation, is 60 to 65%. The  left ventricle has normal function. The left ventricle has no regional  wall motion abnormalities. Left ventricular diastolic parameters are  indeterminate.   2. Right ventricular systolic function is normal. The right ventricular  size is mildly enlarged. There is severely elevated pulmonary artery  systolic pressure. The estimated right ventricular systolic pressure is  27.7 mmHg.   3. The mitral valve is grossly normal. Mild to moderate mitral valve  regurgitation.   4. Tricuspid valve regurgitation is moderate to severe.   5. The aortic valve is grossly normal. Aortic valve regurgitation is not  visualized. There is Raney Antwine 21 mm Magna Ease valve present in the aortic  position. Procedure Date: 06/25/2012.   6. Pulmonic valve regurgitation is moderate to severe.      Summary:  RIGHT:  - There is no evidence of deep vein thrombosis in the lower extremity.     - No cystic structure found in the popliteal fossa.     LEFT:  - Findings consistent with acute deep vein thrombosis involving the left  posterior tibial veins.  - No cystic structure found in the popliteal fossa.      Antimicrobials:  Anti-infectives (From admission, onward)    Start     Dose/Rate Route Frequency Ordered Stop   02/03/21 1000  azithromycin (ZITHROMAX) tablet 500 mg        500 mg  Oral Daily 02/02/21 2250     02/03/21 0800  cefTRIAXone (ROCEPHIN) 2 g in sodium chloride 0.9 % 100 mL IVPB        2 g 200 mL/hr over 30 Minutes Intravenous Every 24 hours 02/02/21 2250     02/02/21 2145  vancomycin (VANCOREADY) IVPB 1500 mg/300 mL        1,500 mg 150 mL/hr over 120 Minutes Intravenous  Once 02/02/21 2138 02/03/21 0100   02/02/21 2130  ceFEPIme (MAXIPIME) 2 g in sodium chloride 0.9 % 100 mL IVPB        2 g 200 mL/hr over 30 Minutes Intravenous  Once 02/02/21 2129 02/02/21 2211      Subjective: Delirious, says something about seeing soemone who took her downstairs  Objective: Vitals:   02/05/21 2000 02/05/21 2200 02/06/21 0500 02/06/21 0900  BP:    (!) 130/52  Pulse:    71  Resp:    16  Temp:    99 F (37.2 C)  TempSrc:    Oral  SpO2: 95% 96% 98% 99%  Weight:  Height:        Intake/Output Summary (Last 24 hours) at 02/06/2021 1340 Last data filed at 02/05/2021 2141 Gross per 24 hour  Intake --  Output 500 ml  Net -500 ml   Filed Weights   02/03/21 1952 02/04/21 0500 02/05/21 0500  Weight: 69.7 kg 69.8 kg 68.6 kg    Examination:  General: No acute distress. Cardiovascular: RRR Lungs: unlabored - mild anterior crackles Abdomen:s/nt/nd Neurological: Alert and oriented 3. Moves all extremities 4 . Cranial nerves II through XII grossly intact. Skin: Warm and dry. No rashes or lesions. Extremities: No clubbing or cyanosis. No edema  Data Reviewed: I have personally reviewed following labs and imaging studies  CBC: Recent Labs  Lab 02/02/21 1746 02/02/21 1806 02/03/21 0704 02/04/21 0253 02/05/21 0142 02/06/21 0233  WBC 9.8  --  10.9* 11.2* 10.4 10.1  NEUTROABS 6.6  --  7.3 7.6 7.6 7.3  HGB 10.0* 10.5* 10.3* 10.1* 9.3* 9.4*  HCT 30.9* 31.0* 32.2* 32.4* 29.7* 30.4*  MCV 91.4  --  92.0 93.6 92.5 94.1  PLT 216  --  275 253 240 956    Basic Metabolic Panel: Recent Labs  Lab 02/02/21 1920 02/03/21 0704 02/04/21 0253 02/05/21 0142  02/06/21 0233  NA 137 137 136 139 140  K 3.2* 3.8 3.7 4.1 3.5  CL 97* 99 100 100 96*  CO2 29 30 31  35* 36*  GLUCOSE 181* 241* 88 73 130*  BUN 16 15 10 12 13   CREATININE 1.11* 1.08* 0.90 0.88 0.91  CALCIUM 9.0 8.6* 8.5* 8.5* 8.7*  MG  --   --  2.1 2.2 2.1  PHOS  --   --  2.8 2.6 2.4*    GFR: Estimated Creatinine Clearance: 42.5 mL/min (by C-G formula based on SCr of 0.91 mg/dL).  Liver Function Tests: Recent Labs  Lab 02/02/21 1920 02/03/21 0704 02/04/21 0253 02/05/21 0142 02/06/21 0233  AST 53* 37 35 41 54*  ALT 32 29 25 25 27   ALKPHOS 826* 709* 675* 700* 718*  BILITOT 0.5 0.6 0.7 0.7 0.6  PROT 7.0 6.4* 6.6 6.3* 6.6  ALBUMIN 2.5* 2.1* 2.2* 2.1* 2.1*    CBG: Recent Labs  Lab 02/05/21 1225 02/05/21 1559 02/05/21 2200 02/06/21 0752 02/06/21 1151  GLUCAP 126* 110* 198* 94 95     Recent Results (from the past 240 hour(s))  Resp Panel by RT-PCR (Flu Meganne Rita&B, Covid) Nasopharyngeal Swab     Status: None   Collection Time: 02/02/21  5:42 PM   Specimen: Nasopharyngeal Swab; Nasopharyngeal(NP) swabs in vial transport medium  Result Value Ref Range Status   SARS Coronavirus 2 by RT PCR NEGATIVE NEGATIVE Final    Comment: (NOTE) SARS-CoV-2 target nucleic acids are NOT DETECTED.  The SARS-CoV-2 RNA is generally detectable in upper respiratory specimens during the acute phase of infection. The lowest concentration of SARS-CoV-2 viral copies this assay can detect is 138 copies/mL. Govanni Plemons negative result does not preclude SARS-Cov-2 infection and should not be used as the sole basis for treatment or other patient management decisions. Kalimah Capurro negative result may occur with  improper specimen collection/handling, submission of specimen other than nasopharyngeal swab, presence of viral mutation(s) within the areas targeted by this assay, and inadequate number of viral copies(<138 copies/mL). Dimetri Armitage negative result must be combined with clinical observations, patient history, and  epidemiological information. The expected result is Negative.  Fact Sheet for Patients:  EntrepreneurPulse.com.au  Fact Sheet for Healthcare Providers:  IncredibleEmployment.be  This test is no  t yet approved or cleared by the [place] and  has been authorized for detection and/or diagnosis of SARS-CoV-2 by FDA under an Emergency Use Authorization (EUA). This EUA will remain  in effect (meaning this test can be used) for the duration of the COVID-19 declaration under Section 564(b)(1) of the Act, 21 U.S.C.section 360bbb-3(b)(1), unless the authorization is terminated  or revoked sooner.       Influenza [place] by PCR NEGATIVE NEGATIVE Final   Influenza B by PCR NEGATIVE NEGATIVE Final    Comment: (NOTE) The Xpert Xpress SARS-CoV-2/FLU/RSV plus assay is intended as an aid in the diagnosis of influenza from Nasopharyngeal swab specimens and should not be used as Mark Hassey sole basis for treatment. Nasal washings and aspirates are unacceptable for Xpert Xpress SARS-CoV-2/FLU/RSV testing.  Fact Sheet for Patients: EntrepreneurPulse.com.au  Fact Sheet for Healthcare Providers: IncredibleEmployment.be  This test is not yet approved or cleared by the [place] FDA and has been authorized for detection and/or diagnosis of SARS-CoV-2 by FDA under an Emergency Use Authorization (EUA). This EUA will remain in effect (meaning this test can be used) for the duration of the COVID-19 declaration under Section 564(b)(1) of the Act, 21 U.S.C. section 360bbb-3(b)(1), unless the authorization is terminated or revoked.  Performed at [facility], [place] [address], [place], [place] [address]   Culture, blood (routine x 2)     Status: None (Preliminary result)   Collection Time: 02/02/21  7:20 PM   Specimen: BLOOD RIGHT ARM  Result Value Ref Range Status   Specimen Description BLOOD RIGHT ARM  Final   Special  Requests   Final    BOTTLES DRAWN AEROBIC AND ANAEROBIC Blood Culture results may not be optimal due to an excessive volume of blood received in culture bottles   Culture   Final    NO GROWTH 4 DAYS Performed at [facility], [place] [address], [place], [place] [address]    Report Status PENDING  Incomplete  Culture, blood (routine x 2)     Status: None (Preliminary result)   Collection Time: 02/02/21  7:23 PM   Specimen: BLOOD LEFT ARM  Result Value Ref Range Status   Specimen Description BLOOD LEFT ARM  Final   Special Requests   Final    BOTTLES DRAWN AEROBIC AND ANAEROBIC Blood Culture results may not be optimal due to an excessive volume of blood received in culture bottles   Culture   Final    NO GROWTH 4 DAYS Performed at [facility], [place] [address] [place], [place] [address]    Report Status PENDING  Incomplete  MRSA Next Gen by PCR, Nasal     Status: None   Collection Time: 02/05/21 12:23 PM   Specimen: Nasal Mucosa; Nasal Swab  Result Value Ref Range Status   MRSA by PCR Next Gen NOT DETECTED NOT DETECTED Final    Comment: (NOTE) The GeneXpert MRSA Assay (FDA approved for NASAL specimens only), is one component of Dagny Fiorentino comprehensive MRSA colonization surveillance program. It is not intended to diagnose MRSA infection nor to guide or monitor treatment for MRSA infections. Test performance is not FDA approved in patients less than 56 years old. Performed at [facility], [place] [address] [place], [place] [address]          Radiology Studies: No results found.      Scheduled Meds:  amLODipine  5 mg Oral Daily   azithromycin  500 mg  Oral Daily   carvedilol  12.5 mg Oral BID   feeding supplement  237 mL Oral BID BM   furosemide  20 mg Intravenous BID   insulin aspart  0-15 Units Subcutaneous TID WC   insulin aspart  0-5 Units Subcutaneous QHS   insulin glargine-yfgn  10 Units Subcutaneous QHS   levothyroxine  75 mcg Oral Daily   memantine  5  mg Oral QHS   multivitamin with minerals  1 tablet Oral Daily   Rivaroxaban  15 mg Oral BID WC   Followed by   Derrill Memo ON 02/26/2021] rivaroxaban  20 mg Oral Q supper   rosuvastatin  20 mg Oral Daily   Continuous Infusions:  cefTRIAXone (ROCEPHIN)  IV 2 g (02/06/21 0915)     LOS: 4 days    Time spent: over 30 min    Fayrene Helper, MD [facility]   To contact the attending provider between 7A-7P or the covering provider during after hours 7P-7A, please log into the web site [email or web address] and access using universal [place] password for that web site. If you do not have the password, please call the hospital operator.  02/06/2021, 1:40 PM

## 2021-02-07 LAB — CBC
HCT: 28.7 % — ABNORMAL LOW (ref 36.0–46.0)
Hemoglobin: 8.9 g/dL — ABNORMAL LOW (ref 12.0–15.0)
MCH: 29.4 pg (ref 26.0–34.0)
MCHC: 31 g/dL (ref 30.0–36.0)
MCV: 94.7 fL (ref 80.0–100.0)
Platelets: 215 10*3/uL (ref 150–400)
RBC: 3.03 MIL/uL — ABNORMAL LOW (ref 3.87–5.11)
RDW: 17.1 % — ABNORMAL HIGH (ref 11.5–15.5)
WBC: 10.8 10*3/uL — ABNORMAL HIGH (ref 4.0–10.5)
nRBC: 0 % (ref 0.0–0.2)

## 2021-02-07 LAB — BASIC METABOLIC PANEL
Anion gap: 9 (ref 5–15)
BUN: 15 mg/dL (ref 8–23)
CO2: 37 mmol/L — ABNORMAL HIGH (ref 22–32)
Calcium: 8.4 mg/dL — ABNORMAL LOW (ref 8.9–10.3)
Chloride: 94 mmol/L — ABNORMAL LOW (ref 98–111)
Creatinine, Ser: 0.84 mg/dL (ref 0.44–1.00)
GFR, Estimated: >60 mL/min (ref >60)
Glucose, Bld: 83 mg/dL (ref 70–99)
Potassium: 3.6 mmol/L (ref 3.5–5.1)
Sodium: 140 mmol/L (ref 135–145)

## 2021-02-07 LAB — CULTURE, BLOOD (ROUTINE X 2)
Culture: NO GROWTH
Culture: NO GROWTH

## 2021-02-07 LAB — GLUCOSE, CAPILLARY
Glucose-Capillary: 93 mg/dL (ref 70–99)
Glucose-Capillary: 153 mg/dL — ABNORMAL HIGH (ref 70–99)
Glucose-Capillary: 202 mg/dL — ABNORMAL HIGH (ref 70–99)

## 2021-02-07 NOTE — TOC Initial Note (Signed)
Transition of Care [facility]) - Initial/Assessment Note    Patient Details  Name: [name] MRN: [id number] Date of Birth: 12-04-1932  Transition of Care [facility]) CM/SW Contact:    Joanne Chars, LCSW Phone Number: 02/07/2021, 11:19 AM  Clinical Narrative:     CSW informed by MD that pt/family have decided on residential hospice at [facility].  CSW met with pt in her room alone, came back a short while later and was able to meet sister Lurline Hare and stepdaughter Edilia Bo [phone])  CSW  confirmed their choice for [facility], discussed process of contacting the liason who will reach out to them about the process.  Bevely Palmer at [facility] informed and will follow up.              Expected Discharge Plan: [place] Barriers to Discharge: Continued Medical Work up   Patient Goals and CMS Choice Patient states their goals for this hospitalization and ongoing recovery are:: "I don't think I'm going to be around much longer.  I worry about my sister." CMS Medicare.gov Compare Post Acute Care list provided to::  (family chose [facility])    Expected Discharge Plan and Services Expected Discharge Plan: [place] In-house Referral: Clinical Social Work   Post Acute Care Choice: Hospice Living arrangements for the past 2 months: [place]                                      Prior Living Arrangements/Services Living arrangements for the past 2 months: Single Family Home Lives with:: Siblings Patient language and need for interpreter reviewed:: Yes Do you feel safe going back to the place where you live?: Yes      Need for Family Participation in Patient Care: Yes (Comment) Care giver support system in place?: Yes (comment) Current home services: Other (comment) (none) Criminal Activity/Legal Involvement Pertinent to Current Situation/Hospitalization: No - Comment as needed  Activities of Daily Living Home  Assistive Devices/Equipment: Blood pressure cuff, CBG Meter, Eyeglasses, Dentures (specify type) ADL Screening (condition at time of admission) Patient's cognitive ability adequate to safely complete daily activities?: Yes Is the patient deaf or have difficulty hearing?: No Does the patient have difficulty seeing, even when wearing glasses/contacts?: No Does the patient have difficulty concentrating, remembering, or making decisions?: No Patient able to express need for assistance with ADLs?: Yes Does the patient have difficulty dressing or bathing?: Yes (d/t SOB) Independently performs ADLs?: Yes (appropriate for developmental age) Does the patient have difficulty walking or climbing stairs?: Yes Weakness of Legs: Both Weakness of Arms/Hands: None  Permission Sought/Granted Permission sought to share information with : Family Supports Permission granted to share information with : Yes, Verbal Permission Granted  Share Information with NAME: sister [name]           Emotional Assessment Appearance:: Appears stated age Attitude/Demeanor/Rapport: Engaged Affect (typically observed): Appropriate, Pleasant Orientation: : Oriented to Self Alcohol / Substance Use: Not Applicable Psych Involvement: No (comment)  Admission diagnosis:  Acute pulmonary embolism ([place]) [I26.99] Dyspnea, unspecified type [R06.00] Patient Active Problem List   Diagnosis Date Noted   Pancreatic cancer metastasized to liver ([place]) 02/05/2021   Goals of care, counseling/discussion 02/05/2021   Acute pulmonary embolism ([place]) 02/02/2021   Acute respiratory failure with hypoxia ([place]) 02/02/2021   Multiple pulmonary nodules - with concern for metastatic disease 02/02/2021   Pleural effusion on right 02/02/2021  Overactive bladder 10/27/2015   S/P CABG (coronary artery bypass graft) 07/16/2012   S/P AVR (aortic valve replacement) - 06/25/2012 06/25/2012   Type 2 diabetes mellitus without complication ([place])  38/88/2800   Essential hypertension 05/20/2012   Hyperlipidemia 05/20/2012   Hypothyroidism 05/20/2012   Aortic stenosis 05/10/2011   PCP:  Rogers Blocker, MD Pharmacy:   [facility] [place], [place] [address] [place] [address] Phone: [phone] Fax: [phone]  [id number] [place] [address] [id number]: [phone]: [phone]    Social Determinants of Health (SDOH) Interventions    Readmission Risk Interventions No flowsheet data found.

## 2021-02-07 NOTE — Progress Notes (Signed)
This chaplain responded to PMT consult for spiritual care after the diagnosis of new metastatic cancer.  The Pt. daughter and sister are at the bedside.  The chaplain was greeted by the Pt. friendly personality and story telling.    The Hospice team arrived from [facility] and the chaplain stepped aside. This chaplain will F/U at the best time for the Pt. and spiritual care team.

## 2021-02-07 NOTE — Progress Notes (Addendum)
[id number] [facility]) Hospital Liaison Note  Received request from [facility], Marya Amsler W.,for family interest in [facility]. Chart reviewed and spoke with family to acknowledge referral. Hospice eligibility confirmed. Unfortunately [facility] is not able to offer a room today. Family and [facility] manager aware hospital liaison will follow up tomorrow or sooner if room becomes available. Please do not hesitate to call with questions. Thank you.   Daphene Calamity, MSW [facility] Social Worker [phone]

## 2021-02-07 NOTE — Progress Notes (Signed)
PROGRESS NOTE    [name]  GHW:[id number] DOB: [birth year] DOA: 02/02/2021 PCP: Rogers Blocker, MD   Chief Complaint  Patient presents with   Shortness of Breath   Weakness   Brief Narrative:  Very sweet 87 year old African-American female with [name] history of hypertension, type 2 diabetes on insulin, hyperlipidemia, acquired hypothyroidism presents to the ER today for the PCP office.  Patient noted to be hypoxic in the office.  Patient arrived with room air saturations of 85%.  With ambulation her sats dropped to 60%.  Patient states that over the last 3 weeks she has noticed increasing dyspnea and cough.  She took some cough medicine.  She thought that she was initially getting better.  Over the last 2 weeks, she has felt worse.  She is bringing up clear-colored sputum.  She is only had 1 episode that was mildly blood-tinged.  She is increasingly dyspneic with just minimal exertion.  Patient is notes approximately 40-50 pound weight loss over the last 3 weeks.  She states she mentioned this to her PCP.  In the ER, patient noted to be on admission.  Patient placed initially on nonrebreather and then decreased to 2 L Cortne Amara minute.  Laboratory work-up demonstrated BNP of 351.  D-dimer was positive for 0.87.  Chemistry showed Taiylor Virden sodium 137 potassium 3.2 chloride of 29 bicarbonate of 16 been of 1.1 glucose of 181.  CBC showed Sherril Heyward white count of 9.8, hemoglobin 10.0 platelets of 216.  ABG on 2 L oxygen showed pH 7.43 PCO2 47 PO2 of 61.  Patient's COVID-negative.  CTPA demonstrated left upper lobe pulmonary embolism.  There are multiple pulmonary nodules that were also seen on her chest x-ray and August 2022 and May 2022.  She had mediastinal adenopathy.  There is concern for metastatic disease.  She has Takiyah Bohnsack moderate sized right-sided pleural effusion.   Patient lives in an apartment with her 25 year old sister.  Due to the patient's pulmonary embolism, acute hypoxic respiratory failure, concern for  metastatic lung disease, [facility] hospitalist contacted for admission.   Assessment & Plan:   Principal Problem:   Acute pulmonary embolism (HCC) Active Problems:   Essential hypertension   Hyperlipidemia   Hypothyroidism   Type 2 diabetes mellitus without complication (HCC)   Acute respiratory failure with hypoxia (HCC)   Multiple pulmonary nodules - with concern for metastatic disease   S/P AVR (aortic valve replacement) - 06/25/2012   Pleural effusion on right   Pancreatic cancer metastasized to liver [facility])   Goals of care, counseling/discussion  Goals of care Planning for hospice after discussions with palliative and oncology.   Inpatient hospice referral placed.  Will place comfort measure order.  Unrestricted visitors.   Acute Metabolic Encephalopathy  Delirium Delirium related to hospitalization, pneumonia, etc - she's on namenda (no hx of dementia in chart, ?cognitive deficit) Follow venous blood gas - mildly elevated Pco2 with normal pH Continue to treat PE, pneumonia Delirium precautions - continued delirium - improved W/u additionally as indicated  Left Upper Lobe Pulmonary Embolus  L Posterior Tibial DVT Portal Vein Thrombosis  Superior Mesenteric Vein Thrombosis 2/2 metastatic cancer Xarelto per oncology- mild hemoptysis today, continue xarelto Echo with normal RVSF, moderate to severe TV, PV regurg moderate to severe LE [place] with acute DVT of L posterior tibial veins  Metastatic Cancer Likely pancreatic Planning for inpatient hospice   Multifocal Pneumonia Continue ceftriaxone/azithromycin -> plan for 5 days (completed today) CXR 10/7 with dense bilateral patchy airspace disease  Continue lasix, ? Component of edema/overload -> transition to oral lasix (she's developed alkalosis) Continue to monitor Small volume hemoptysis as noted above, follow  Acute hypoxic Respiratory Failure Multiple reasons being treated above O2 needs increased today Plan is for  inpatient hospice  Severely elevated PASP  Moderate to Severe TV Regurgitation  Mild to Moderate MV Regurgitation  Moderate to Severe PV Regurgitation  Pyuria Follow culture  HTN coreg  T2DM SSI, basal 10 units Continue to monitor  HLD crestor  Hypothyroidism Synthroid  Namenda  DVT prophylaxis: heparin -> lovenox -> xarelto Code Status: DNR  Family Communication: daughter and sister at bedside Disposition:   Status is: Inpatient  Remains inpatient appropriate because:Inpatient level of care appropriate due to severity of illness  Dispo: The patient is from: Home              Anticipated d/c is to: Home              Patient currently is not medically stable to d/c.   Difficult to place patient No       Consultants:  none  Procedures:  Echo IMPRESSIONS     1. Left ventricular ejection fraction, by estimation, is 60 to 65%. The  left ventricle has normal function. The left ventricle has no regional  wall motion abnormalities. Left ventricular diastolic parameters are  indeterminate.   2. Right ventricular systolic function is normal. The right ventricular  size is mildly enlarged. There is severely elevated pulmonary artery  systolic pressure. The estimated right ventricular systolic pressure is  33.8 mmHg.   3. The mitral valve is grossly normal. Mild to moderate mitral valve  regurgitation.   4. Tricuspid valve regurgitation is moderate to severe.   5. The aortic valve is grossly normal. Aortic valve regurgitation is not  visualized. There is Arnella Pralle 21 mm Magna Ease valve present in the aortic  position. Procedure Date: 06/25/2012.   6. Pulmonic valve regurgitation is moderate to severe.      Summary:  RIGHT:  - There is no evidence of deep vein thrombosis in the lower extremity.     - No cystic structure found in the popliteal fossa.     LEFT:  - Findings consistent with acute deep vein thrombosis involving the left  posterior tibial veins.  -  No cystic structure found in the popliteal fossa.      Antimicrobials:  Anti-infectives (From admission, onward)    Start     Dose/Rate Route Frequency Ordered Stop   02/03/21 1000  azithromycin (ZITHROMAX) tablet 500 mg  Status:  Discontinued        500 mg Oral Daily 02/02/21 2250 02/07/21 1238   02/03/21 0800  cefTRIAXone (ROCEPHIN) 2 g in sodium chloride 0.9 % 100 mL IVPB  Status:  Discontinued        2 g 200 mL/hr over 30 Minutes Intravenous Every 24 hours 02/02/21 2250 02/07/21 1238   02/02/21 2145  vancomycin (VANCOREADY) IVPB 1500 mg/300 mL        1,500 mg 150 mL/hr over 120 Minutes Intravenous  Once 02/02/21 2138 02/03/21 0100   02/02/21 2130  ceFEPIme (MAXIPIME) 2 g in sodium chloride 0.9 % 100 mL IVPB        2 g 200 mL/hr over 30 Minutes Intravenous  Once 02/02/21 2129 02/02/21 2211      Subjective: Watching westerns which she enjoys Small volume hemoptysis  Objective: Vitals:   02/06/21 0900 02/06/21 1948 02/07/21 0358 02/07/21 0900  BP: (!) 130/52 (!) 138/58 (!) 139/57 113/86  Pulse: 71 63 65 62  Resp: 16 18 20 18   Temp: 99 F (37.2 C) 98.2 F (36.8 C) 98.4 F (36.9 C) 98.9 F (37.2 C)  TempSrc: Oral  Oral Oral  SpO2: 99% 99% 100% 98%  Weight:      Height:        Intake/Output Summary (Last 24 hours) at 02/07/2021 1238 Last data filed at 02/07/2021 0745 Gross per 24 hour  Intake 240 ml  Output 650 ml  Net -410 ml   Filed Weights   02/03/21 1952 02/04/21 0500 02/05/21 0500  Weight: 69.7 kg 69.8 kg 68.6 kg    Examination:  General: No acute distress. Cardiovascular: RRR Lungs: coarse breath sounds - on 5 L today Abdomen: Soft, nontender, nondistended  Neurological: Alert and oriented 3. Moves all extremities 4. Cranial nerves II through XII grossly intact. Skin: Warm and dry. No rashes or lesions. Extremities: No clubbing or cyanosis. No edema.   Data Reviewed: I have personally reviewed following labs and imaging studies  CBC: Recent  Labs  Lab 02/02/21 1746 02/02/21 1806 02/03/21 0704 02/04/21 0253 02/05/21 0142 02/06/21 0233 02/07/21 0204  WBC 9.8  --  10.9* 11.2* 10.4 10.1 10.8*  NEUTROABS 6.6  --  7.3 7.6 7.6 7.3  --   HGB 10.0*   < > 10.3* 10.1* 9.3* 9.4* 8.9*  HCT 30.9*   < > 32.2* 32.4* 29.7* 30.4* 28.7*  MCV 91.4  --  92.0 93.6 92.5 94.1 94.7  PLT 216  --  275 253 240 235 215   < > = values in this interval not displayed.    Basic Metabolic Panel: Recent Labs  Lab 02/03/21 0704 02/04/21 0253 02/05/21 0142 02/06/21 0233 02/07/21 0204  NA 137 136 139 140 140  K 3.8 3.7 4.1 3.5 3.6  CL 99 100 100 96* 94*  CO2 30 31 35* 36* 37*  GLUCOSE 241* 88 73 130* 83  BUN 15 10 12 13 15   CREATININE 1.08* 0.90 0.88 0.91 0.84  CALCIUM 8.6* 8.5* 8.5* 8.7* 8.4*  MG  --  2.1 2.2 2.1  --   PHOS  --  2.8 2.6 2.4*  --     GFR: Estimated Creatinine Clearance: 46.1 mL/min (by C-G formula based on SCr of 0.84 mg/dL).  Liver Function Tests: Recent Labs  Lab 02/02/21 1920 02/03/21 0704 02/04/21 0253 02/05/21 0142 02/06/21 0233  AST 53* 37 35 41 54*  ALT 32 29 25 25 27   ALKPHOS 826* 709* 675* 700* 718*  BILITOT 0.5 0.6 0.7 0.7 0.6  PROT 7.0 6.4* 6.6 6.3* 6.6  ALBUMIN 2.5* 2.1* 2.2* 2.1* 2.1*    CBG: Recent Labs  Lab 02/06/21 1151 02/06/21 1617 02/06/21 1949 02/07/21 0743 02/07/21 1221  GLUCAP 95 190* 178* 93 153*     Recent Results (from the past 240 hour(s))  Resp Panel by RT-PCR (Flu Glynn Yepes&B, Covid) Nasopharyngeal Swab     Status: None   Collection Time: 02/02/21  5:42 PM   Specimen: Nasopharyngeal Swab; Nasopharyngeal(NP) swabs in vial transport medium  Result Value Ref Range Status   SARS Coronavirus 2 by RT PCR NEGATIVE NEGATIVE Final    Comment: (NOTE) SARS-CoV-2 target nucleic acids are NOT DETECTED.  The SARS-CoV-2 RNA is generally detectable in upper respiratory specimens during the acute phase of infection. The lowest concentration of SARS-CoV-2 viral copies this assay can detect  is 138 copies/mL. Nikolos Billig negative result does not preclude  SARS-Cov-2 infection and should not be used as the sole basis for treatment or other patient management decisions. Kanisha Duba negative result may occur with  improper specimen collection/handling, submission of specimen other than nasopharyngeal swab, presence of viral mutation(s) within the areas targeted by this assay, and inadequate number of viral copies(<138 copies/mL). Welda Azzarello negative result must be combined with clinical observations, patient history, and epidemiological information. The expected result is Negative.  Fact Sheet for Patients:  EntrepreneurPulse.com.au  Fact Sheet for Healthcare Providers:  IncredibleEmployment.be  This test is no t yet approved or cleared by the [place] FDA and  has been authorized for detection and/or diagnosis of SARS-CoV-2 by FDA under an Emergency Use Authorization (EUA). This EUA will remain  in effect (meaning this test can be used) for the duration of the COVID-19 declaration under Section 564(b)(1) of the Act, 21 U.S.C.section 360bbb-3(b)(1), unless the authorization is terminated  or revoked sooner.       Influenza Marabeth Melland by PCR NEGATIVE NEGATIVE Final   Influenza B by PCR NEGATIVE NEGATIVE Final    Comment: (NOTE) The Xpert Xpress SARS-CoV-2/FLU/RSV plus assay is intended as an aid in the diagnosis of influenza from Nasopharyngeal swab specimens and should not be used as Jorene Kaylor sole basis for treatment. Nasal washings and aspirates are unacceptable for Xpert Xpress SARS-CoV-2/FLU/RSV testing.  Fact Sheet for Patients: EntrepreneurPulse.com.au  Fact Sheet for Healthcare Providers: IncredibleEmployment.be  This test is not yet approved or cleared by the [place] FDA and has been authorized for detection and/or diagnosis of SARS-CoV-2 by FDA under an Emergency Use Authorization (EUA). This EUA will remain in effect  (meaning this test can be used) for the duration of the COVID-19 declaration under Section 564(b)(1) of the Act, 21 U.S.C. section 360bbb-3(b)(1), unless the authorization is terminated or revoked.  Performed at [facility], [place] [address], [place], [place] [address]   Culture, blood (routine x 2)     Status: None   Collection Time: 02/02/21  7:20 PM   Specimen: BLOOD RIGHT ARM  Result Value Ref Range Status   Specimen Description BLOOD RIGHT ARM  Final   Special Requests   Final    BOTTLES DRAWN AEROBIC AND ANAEROBIC Blood Culture results may not be optimal due to an excessive volume of blood received in culture bottles   Culture   Final    NO GROWTH 5 DAYS Performed at [facility], [place] [address] [place], [place] [address]    Report Status 02/07/2021 FINAL  Final  Culture, blood (routine x 2)     Status: None   Collection Time: 02/02/21  7:23 PM   Specimen: BLOOD LEFT ARM  Result Value Ref Range Status   Specimen Description BLOOD LEFT ARM  Final   Special Requests   Final    BOTTLES DRAWN AEROBIC AND ANAEROBIC Blood Culture results may not be optimal due to an excessive volume of blood received in culture bottles   Culture   Final    NO GROWTH 5 DAYS Performed at [facility], [place] [address] [place], [place] [address]    Report Status 02/07/2021 FINAL  Final  MRSA Next Gen by PCR, Nasal     Status: None   Collection Time: 02/05/21 12:23 PM   Specimen: Nasal Mucosa; Nasal Swab  Result Value Ref Range Status   MRSA by PCR Next Gen NOT DETECTED NOT DETECTED Final    Comment: (NOTE) The GeneXpert MRSA Assay (FDA approved for  NASAL specimens only), is one component of Leotha Westermeyer comprehensive MRSA colonization surveillance program. It is not intended to diagnose MRSA infection nor to guide or monitor treatment for MRSA infections. Test performance is not FDA approved in patients less than 73 years old. Performed at [facility], [place] [address],  [place], [place] [address]          Radiology Studies: No results found.      Scheduled Meds:  amLODipine  5 mg Oral Daily   carvedilol  12.5 mg Oral BID   feeding supplement  237 mL Oral BID BM   furosemide  20 mg Oral Daily   insulin aspart  0-15 Units Subcutaneous TID WC   insulin aspart  0-5 Units Subcutaneous QHS   insulin glargine-yfgn  10 Units Subcutaneous QHS   levothyroxine  75 mcg Oral Daily   memantine  5 mg Oral QHS   multivitamin with minerals  1 tablet Oral Daily   Rivaroxaban  15 mg Oral BID WC   Followed by   Derrill Memo ON 02/26/2021] rivaroxaban  20 mg Oral Q supper   rosuvastatin  20 mg Oral Daily   Continuous Infusions:     LOS: 5 days    Time spent: over 30 min    Fayrene Helper, MD [facility]   To contact the attending provider between 7A-7P or the covering provider during after hours 7P-7A, please log into the web site [email or web address] and access using universal [place] password for that web site. If you do not have the password, please call the hospital operator.  02/07/2021, 12:38 PM

## 2021-02-08 ENCOUNTER — Other Ambulatory Visit (HOSPITAL_COMMUNITY): Payer: Self-pay

## 2021-02-08 DIAGNOSIS — C787 Secondary malignant neoplasm of liver and intrahepatic bile duct: Secondary | ICD-10-CM | POA: Diagnosis not present

## 2021-02-08 DIAGNOSIS — I2699 Other pulmonary embolism without acute cor pulmonale: Secondary | ICD-10-CM | POA: Diagnosis not present

## 2021-02-08 DIAGNOSIS — C7800 Secondary malignant neoplasm of unspecified lung: Secondary | ICD-10-CM | POA: Diagnosis not present

## 2021-02-08 DIAGNOSIS — C259 Malignant neoplasm of pancreas, unspecified: Secondary | ICD-10-CM | POA: Diagnosis not present

## 2021-02-08 LAB — GLUCOSE, CAPILLARY
Glucose-Capillary: 115 mg/dL — ABNORMAL HIGH (ref 70–99)
Glucose-Capillary: 155 mg/dL — ABNORMAL HIGH (ref 70–99)
Glucose-Capillary: 218 mg/dL — ABNORMAL HIGH (ref 70–99)
Glucose-Capillary: 222 mg/dL — ABNORMAL HIGH (ref 70–99)
Glucose-Capillary: 97 mg/dL (ref 70–99)

## 2021-02-08 MED ORDER — RIVAROXABAN (XARELTO) VTE STARTER PACK (15 & 20 MG)
ORAL_TABLET | ORAL | 0 refills | Status: AC
  Filled 2021-02-08: qty 51, 30d supply, fill #0

## 2021-02-08 MED ORDER — TOUJEO SOLOSTAR 300 UNIT/ML ~~LOC~~ SOPN: 10.0000 [IU] | PEN_INJECTOR | Freq: Every day | SUBCUTANEOUS | 3 refills | Status: AC

## 2021-02-08 NOTE — Discharge Summary (Signed)
Physician Discharge Summary  Aleah Ahlgrim NLZ:[id number] DOB: [birth year] DOA: 02/02/2021  PCP: Rogers Blocker, MD  Admit date: 02/02/2021 Discharge date: 02/08/2021  Time spent: 40 minutes  Recommendations for Outpatient Follow-up:  Comfort measures per hospice Continuing anticoagulation as long as she's able to continue taking this PO safely (prescription filled her at [place])  Discharge Diagnoses:  Principal Problem:   Acute pulmonary embolism ([place]) Active Problems:   Essential hypertension   Hyperlipidemia   Hypothyroidism   Type 2 diabetes mellitus without complication ([place])   Acute respiratory failure with hypoxia ([facility])   Multiple pulmonary nodules - with concern for metastatic disease   S/P AVR (aortic valve replacement) - 06/25/2012   Pleural effusion on right   Pancreatic cancer metastasized to liver [facility])   Goals of care, counseling/discussion   Discharge Condition: stable  Diet recommendation: comfort  Filed Weights   02/03/21 1952 02/04/21 0500 02/05/21 0500  Weight: 69.7 kg 69.8 kg 68.6 kg    History of present illness:  87 yo with hx HTN, T2DM, HLD, hypothyroidism who was sent from PCP office with SOB and found to have PE/VTE and likely metastatic pancreatic cancer.  She's been seen by palliative care and oncology and current plan is for inpatient hospice.  She has [facility] bed today.  Plan for discharge.    See below for additional details   Hospital Course:  Goals of care Planning for hospice after discussions with palliative and oncology.  Oncology suspects her prognosis is about 1 month.  Inpatient hospice referral placed.  Will place comfort measure order.  Unrestricted visitors.  Awaiting inpatient hospice bed   Acute Metabolic Encephalopathy  Delirium Delirium related to hospitalization, pneumonia, etc - she's on namenda (no hx of dementia in chart, ?cognitive deficit) Follow venous blood gas - mildly elevated Pco2 with normal pH Continue to  treat PE, pneumonia Delirium precautions - continued delirium - improved W/u additionally as indicated   Left Upper Lobe Pulmonary Embolus  L Posterior Tibial DVT Portal Vein Thrombosis  Superior Mesenteric Vein Thrombosis 2/2 metastatic cancer Xarelto per oncology- mild hemoptysis previously, continue xarelto  - will prescribe this to be filled by [facility] [facility] and she'll bring this with her to hospice as they won't fill there. Echo with normal RVSF, moderate to severe TV, PV regurg moderate to severe LE [place] with acute DVT of L posterior tibial veins   Metastatic Cancer Likely pancreatic Planning for inpatient hospice    Multifocal Pneumonia Continue ceftriaxone/azithromycin -> she's completed 5 days of abx CXR 10/7 with dense bilateral patchy airspace disease Continue lasix, ? Component of edema/overload -> transition to oral lasix (she's developed alkalosis) Continue to monitor Small volume hemoptysis, seems resolved   Acute hypoxic Respiratory Failure Multiple reasons being treated above O2 needs increased today Plan is for inpatient hospice   Severely elevated PASP  Moderate to Severe TV Regurgitation  Mild to Moderate MV Regurgitation  Moderate to Severe PV Regurgitation   Pyuria Follow culture   HTN coreg   T2DM SSI, basal 10 units Continue to monitor   HLD crestor   Hypothyroidism Synthroid   Namenda  Procedures: Echo IMPRESSIONS     1. Left ventricular ejection fraction, by estimation, is 60 to 65%. The  left ventricle has normal function. The left ventricle has no regional  wall motion abnormalities. Left ventricular diastolic parameters are  indeterminate.   2. Right ventricular systolic function is normal. The right ventricular  size is mildly enlarged. There  is severely elevated pulmonary artery  systolic pressure. The estimated right ventricular systolic pressure is  30.1 mmHg.   3. The mitral valve is grossly normal. Mild to moderate  mitral valve  regurgitation.   4. Tricuspid valve regurgitation is moderate to severe.   5. The aortic valve is grossly normal. Aortic valve regurgitation is not  visualized. There is Zarria Towell 21 mm Magna Ease valve present in the aortic  position. Procedure Date: 06/25/2012.   6. Pulmonic valve regurgitation is moderate to severe.   Summary:  RIGHT:  - There is no evidence of deep vein thrombosis in the lower extremity.     - No cystic structure found in the popliteal fossa.     LEFT:  - Findings consistent with acute deep vein thrombosis involving the left  posterior tibial veins.  - No cystic structure found in the popliteal fossa.   Consultations: Palliative care oncology  Discharge Exam: Vitals:   02/07/21 0358 02/07/21 0900  BP: (!) 139/57 113/86  Pulse: 65 62  Resp: 20 18  Temp: 98.4 F (36.9 C) 98.9 F (37.2 C)  SpO2: 100% 98%   See progress note Discussed d/c plan with sister and Teidi  Discharge Instructions   Discharge Instructions     Diet - low sodium heart healthy   Complete by: As directed    Discharge instructions   Complete by: As directed    You were admitted with blood clots related to presumed metastatic pancreatic cancer.  You were seen by palliative care and oncology.  We're planning to send [facility].  We'll continue your xarelto (the blood thinning medicine) for as long as it's safe for you to take this.   Hospice will focus on your comfort.   Increase activity slowly   Complete by: As directed       Allergies as of 02/08/2021   No Known Allergies      Medication List     STOP taking these medications    aspirin EC 81 MG tablet   HumaLOG KwikPen 100 UNIT/ML KwikPen Generic drug: insulin lispro   losartan 50 MG tablet Commonly known as: COZAAR   metFORMIN 750 MG 24 hr tablet Commonly known as: GLUCOPHAGE-XR   Synjardy 5-500 MG Tabs Generic drug: Empagliflozin-metFORMIN HCl       TAKE these medications     Accu-Chek FastClix Lancets Misc Use Accu Chek Fastclix to check blood sugar 4 times daily.   amLODipine 5 MG tablet Commonly known as: NORVASC Take 5 mg by mouth daily.   B-D UF III MINI PEN NEEDLES 31G X 5 MM Misc Generic drug: Insulin Pen Needle USE TO INJECT MEDICATION THREE TIMES DAILY   calcium carbonate 500 MG chewable tablet Commonly known as: TUMS - dosed in mg elemental calcium Chew 1 tablet by mouth daily.   carvedilol 12.5 MG tablet Commonly known as: COREG Take 1 tablet (12.5 mg total) by mouth 2 (two) times daily.   cholecalciferol 1000 units tablet Commonly known as: VITAMIN D Take 1,000 Units by mouth daily.   Co Q-10 100 MG Caps Take 1 capsule by mouth daily.   furosemide 20 MG tablet Commonly known as: LASIX Take 1 tablet (20 mg total) by mouth daily as needed for fluid or edema (or weight gain).   levothyroxine 75 MCG tablet Commonly known as: SYNTHROID TAKE 1 TABLET BY MOUTH EVERY DAY   memantine 5 MG tablet Commonly known as: NAMENDA Take 5 mg by mouth at bedtime.  multivitamins ther. w/minerals Tabs tablet Take 1 tablet by mouth daily.   OneTouch Verio test strip Generic drug: glucose blood USE TO CHECK BLOOD SUGAR FOUR TIMES DAILY AS DIRECTED   potassium chloride SA 20 MEQ tablet Commonly known as: KLOR-CON Take 1 tablet (20 mEq total) by mouth 2 (two) times daily. What changed: when to take this   Rivaroxaban Stater Pack (15 mg and 20 mg) Commonly known as: XARELTO STARTER PACK Follow package directions: Take one 15mg  tablet by mouth twice Yeriel Mineo day. On day 22, switch to one 20mg  tablet once Isabellamarie Randa day. Take with food.   rosuvastatin 20 MG tablet Commonly known as: CRESTOR TAKE 1 TABLET BY MOUTH EVERY DAY   Toujeo SoloStar 300 UNIT/ML Solostar Pen Generic drug: insulin glargine (1 Unit Dial) Inject 10 Units into the skin at bedtime. What changed: See the new instructions.   vitamin B-12 100 MCG tablet Commonly known as:  CYANOCOBALAMIN Take 100 mcg by mouth daily.       No Known Allergies    The results of significant diagnostics from this hospitalization (including imaging, microbiology, ancillary and laboratory) are listed below for reference.    Significant Diagnostic Studies: CT Angio Chest PE W and/or Wo Contrast  Addendum Date: 02/02/2021   ADDENDUM REPORT: 02/02/2021 21:29 ADDENDUM: Given the longstanding nature of the nodular opacities in both lungs the possibility of metastatic disease would deserve consideration although felt to be less likely. Critical Value/emergent results were called by telephone at the time of interpretation on 02/02/2021 at 9:26 pm to Dr. Dene Gentry , who verbally acknowledged these results. Electronically Signed   By: Inez Catalina M.D.   On: 02/02/2021 21:29   Result Date: 02/02/2021 CLINICAL DATA:  Shortness of breath, positive D-dimer, initial encounter EXAM: CT ANGIOGRAPHY CHEST WITH CONTRAST TECHNIQUE: Multidetector CT imaging of the chest was performed using the standard protocol during bolus administration of intravenous contrast. Multiplanar CT image reconstructions and MIPs were obtained to evaluate the vascular anatomy. CONTRAST:  60mL OMNIPAQUE IOHEXOL 350 MG/ML SOLN COMPARISON:  Chest x-ray from earlier in the same day. FINDINGS: Cardiovascular: Atherosclerotic calcifications of the thoracic aorta and its branches are noted. No aneurysmal dilatation is seen. No dissection is noted. Aortic valve replacement is noted. The pulmonary artery is well visualized within normal branching pattern. There is Falan Hensler focal filling defect identified in the left upper lobe pulmonary artery consistent with pulmonary embolus at the segmental/subsegmental level. Mediastinum/Nodes: Thoracic inlet is within normal limits. Few scattered mediastinal and hilar lymph nodes are noted. Precarinal node measures 15 mm in short axis. Subcarinal node measures 14 mm in short axis. The esophagus as  visualized demonstrates some contrast material likely related to reflux. Small hiatal hernia is noted. Lungs/Pleura: Lungs demonstrate diffuse patchy consolidation bilaterally similar to that seen on prior plain film examination. These changes likely represent multifocal pneumonia. Moderate-sized right-sided pleural effusion is noted. Tiny left effusion is seen. Upper Abdomen: Visualized upper abdomen is within normal limits. Musculoskeletal: Degenerative changes of the thoracic spine are seen. No acute rib fracture is seen. Review of the MIP images confirms the above findings. IMPRESSION: Changes consistent with left upper lobe pulmonary embolus without right heart strain Patchy bilateral consolidation consistent with multifocal pneumonia. Associated bilateral effusions are noted right greater than left as well as associated likely reactive adenopathy. Aortic Atherosclerosis (ICD10-I70.0). Electronically Signed: By: Inez Catalina M.D. On: 02/02/2021 21:23   CT ABDOMEN PELVIS W CONTRAST  Result Date: 02/03/2021 CLINICAL DATA:  Metastatic disease  evaluation. EXAM: CT ABDOMEN AND PELVIS WITH CONTRAST TECHNIQUE: Multidetector CT imaging of the abdomen and pelvis was performed using the standard protocol following bolus administration of intravenous contrast. CONTRAST:  160mL OMNIPAQUE IOHEXOL 300 MG/ML  SOLN COMPARISON:  CT angiogram chest 02/02/2021. FINDINGS: Lower chest: Small right pleural effusion and bilateral multifocal airspace opacities appear unchanged from the prior examination. Heart is mildly enlarged. Coronary artery calcifications are present. Hepatobiliary: There are numerous hypodense hepatic lesions suspicious for metastatic disease measuring up to 1.9 cm. There is geographic fatty infiltration of the liver versus perfusion abnormality. Gallstones are present. Common bile duct is dilated measuring 1 cm. There is mild intrahepatic biliary ductal dilatation. Pancreas: Ill-defined hypodensity within  the region of the pancreatic head measuring 2.3 x 3.3 cm suspicious for neoplasm. There is diffuse pancreatic atrophy and ductal dilatation of the body and tail the pancreas. Spleen: Normal in size without focal abnormality. Adrenals/Urinary Tract: Adrenal glands are unremarkable. Kidneys are normal, without renal calculi, focal lesion, or hydronephrosis. Bladder is unremarkable. Stomach/Bowel: Stomach is within normal limits. Appendix is not seen. No evidence of bowel wall thickening, distention, or inflammatory changes. There is diffuse colonic diverticulosis without evidence for acute diverticulitis. Vascular/Lymphatic: Aorta is normal in size. There are atherosclerotic calcifications of the aorta. There are some prominent left periaortic lymph nodes measuring up to 9 mm. There is thrombosis of the main portal vein extending into the right main portal vein. There is also thrombosis within the portal confluence and superior mesenteric vein at the level of the pancreas. Splenic vein grossly patent. Reproductive: Small calcified uterine fibroids. Adnexa unremarkable. Other: Small amount of ascites.  Central mesenteric haziness. Musculoskeletal: No acute or significant osseous findings. IMPRESSION: 1. Low-density pancreatic head mass with distal pancreatic atrophy and ductal dilatation most compatible with primary pancreatic neoplasm. 2. Multiple hepatic lesions suspicious for hepatic metastatic disease. 3. Gallstones are present. There is intra and extrahepatic biliary ductal dilatation, likely secondary to pancreatic head mass. 4. Thrombosis within the right portal vein, main portal vein, portal confluence and superior mesenteric vein. 5. Small volume ascites. 6. Prominent left periaortic lymph nodes. 7. Stable multifocal airspace opacities and small right pleural effusion. 8.  Aortic Atherosclerosis (ICD10-I70.0). 9. These results were called by telephone at the time of interpretation on 02/03/2021 at 3:49 pm to  provider Adonai Helzer Cephus Slater , who verbally acknowledged these results. Electronically Signed   By: Ronney Asters M.D.   On: 02/03/2021 15:50   DG CHEST PORT 1 VIEW  Result Date: 02/04/2021 CLINICAL DATA:  Short of breath EXAM: PORTABLE CHEST 1 VIEW COMPARISON:  02/02/2021 FINDINGS: Sternotomy wires overlies stable cardiac silhouette. There is dense patchy bilateral airspace disease not improved. No pneumothorax. Minimal effusions. IMPRESSION: No interval change in dense bilateral patchy airspace disease Electronically Signed   By: Suzy Bouchard M.D.   On: 02/04/2021 13:17   DG Chest Port 1 View  Result Date: 02/02/2021 CLINICAL DATA:  Shortness of breath. Weakness. Poor oxygen saturation. EXAM: PORTABLE CHEST 1 VIEW COMPARISON:  12/15/2020 and multiple previous FINDINGS: Previous median sternotomy, CABG and aortic valve replacement. Worsening of widespread pulmonary opacity that could represent Memorie Yokoyama combination of pneumonia and edema. There may be Aasiya Creasey small amount of pleural fluid. No acute bone finding. IMPRESSION: Worsening of widespread bilateral pulmonary opacity which could be Chance Karam combination of edema and pneumonia. Electronically Signed   By: Nelson Chimes M.D.   On: 02/02/2021 18:06   ECHOCARDIOGRAM COMPLETE  Result Date: 02/03/2021  ECHOCARDIOGRAM REPORT   Patient Name:   TARIAH TRANSUE Date of Exam: 02/03/2021 Medical Rec #:  [id number]         Height:       63.5 in Accession #:    [id number]        Weight:       177.0 lb Date of Birth:  08-Sep-1932        BSA:          1.847 m Patient Age:    87 years          BP:           138/51 mmHg Patient Gender: F                 HR:           67 bpm. Exam Location:  Inpatient Procedure: 2D Echo, 3D Echo, Cardiac Doppler and Color Doppler Indications:    Dyspnea R06.00  History:        Patient has prior history of Echocardiogram examinations, most                 recent 10/07/2020. CAD, Prior CABG; Risk Factors:Hypertension,                 Diabetes and  Dyslipidemia. Multiple pulmonary nodules, Acute                 respiratory failure with hypoxia.                 Aortic Valve: 21 mm Magna Ease valve is present in the aortic                 position. Procedure Date: 06/25/2012.  Sonographer:    Darlina Sicilian RDCS Referring Phys: [place]  1. Left ventricular ejection fraction, by estimation, is 60 to 65%. The left ventricle has normal function. The left ventricle has no regional wall motion abnormalities. Left ventricular diastolic parameters are indeterminate.  2. Right ventricular systolic function is normal. The right ventricular size is mildly enlarged. There is severely elevated pulmonary artery systolic pressure. The estimated right ventricular systolic pressure is 67.6 mmHg.  3. The mitral valve is grossly normal. Mild to moderate mitral valve regurgitation.  4. Tricuspid valve regurgitation is moderate to severe.  5. The aortic valve is grossly normal. Aortic valve regurgitation is not visualized. There is Monserat Prestigiacomo 21 mm Magna Ease valve present in the aortic position. Procedure Date: 06/25/2012.  6. Pulmonic valve regurgitation is moderate to severe. FINDINGS  Left Ventricle: Left ventricular ejection fraction, by estimation, is 60 to 65%. The left ventricle has normal function. The left ventricle has no regional wall motion abnormalities. The left ventricular internal cavity size was normal in size. There is  no left ventricular hypertrophy. Left ventricular diastolic parameters are indeterminate. Right Ventricle: The right ventricular size is mildly enlarged. Right vetricular wall thickness was not well visualized. Right ventricular systolic function is normal. There is severely elevated pulmonary artery systolic pressure. The tricuspid regurgitant velocity is 3.85 m/s, and with an assumed right atrial pressure of 3 mmHg, the estimated right ventricular systolic pressure is 72.0 mmHg. Left Atrium: Left atrial size was normal in size. Right  Atrium: Right atrial size was normal in size. Pericardium: There is no evidence of pericardial effusion. Mitral Valve: The mitral valve is grossly normal. Mild to moderate mitral valve regurgitation. Tricuspid Valve: The tricuspid valve is grossly normal. Tricuspid valve regurgitation is moderate  to severe. Aortic Valve: The aortic valve is grossly normal. Aortic valve regurgitation is not visualized. Aortic valve mean gradient measures 10.5 mmHg. Aortic valve peak gradient measures 18.6 mmHg. Aortic valve area, by VTI measures 1.55 cm. There is Anothy Bufano 21 mm Magna Ease valve present in the aortic position. Procedure Date: 06/25/2012. Pulmonic Valve: The pulmonic valve was grossly normal. Pulmonic valve regurgitation is moderate to severe. Aorta: The aortic root and ascending aorta are structurally normal, with no evidence of dilitation. IAS/Shunts: The atrial septum is grossly normal.  LEFT VENTRICLE PLAX 2D LVOT diam:     1.90 cm   Diastology LV SV:         73        LV e' medial:    4.30 cm/s LV SV Index:   39        LV E/e' medial:  21.8 LVOT Area:     2.84 cm  LV e' lateral:   8.31 cm/s                          LV E/e' lateral: 11.3                           3D Volume EF:                          3D EF:        61 %                          LV EDV:       118 ml                          LV ESV:       46 ml                          LV SV:        72 ml RIGHT VENTRICLE RV S prime:     6.97 cm/s TAPSE (M-mode): 2.0 cm LEFT ATRIUM             Index        RIGHT ATRIUM           Index LA Vol (A2C):   53.3 ml 28.86 ml/m  RA Area:     14.40 cm LA Vol (A4C):   53.0 ml 28.70 ml/m  RA Volume:   32.10 ml  17.38 ml/m LA Biplane Vol: 54.7 ml 29.62 ml/m  AORTIC VALVE AV Area (Vmax):    1.46 cm AV Area (Vmean):   1.51 cm AV Area (VTI):     1.55 cm AV Vmax:           215.75 cm/s AV Vmean:          153.000 cm/s AV VTI:            0.469 m AV Peak Grad:      18.6 mmHg AV Mean Grad:      10.5 mmHg LVOT Vmax:         111.00 cm/s  LVOT Vmean:        81.500 cm/s LVOT VTI:          0.257 m LVOT/AV VTI ratio: 0.55  AORTA Ao Root diam: 2.30 cm Ao Asc  diam:  3.10 cm MITRAL VALVE                TRICUSPID VALVE MV Area (PHT): 3.50 cm     TR Peak grad:   59.3 mmHg MV Decel Time: 217 msec     TR Vmax:        385.00 cm/s MR Peak grad: 136.9 mmHg MR Mean grad: 92.0 mmHg     SHUNTS MR Vmax:      585.00 cm/s   Systemic VTI:  0.26 m MR Vmean:     465.0 cm/s    Systemic Diam: 1.90 cm MV E velocity: 93.55 cm/s MV Avett Reineck velocity: 124.00 cm/s MV E/Aftin Lye ratio:  0.75 Mertie Moores MD Electronically signed by Mertie Moores MD Signature Date/Time: 02/03/2021/3:44:37 PM    Final    VAS [place] LOWER EXTREMITY VENOUS (DVT)  Result Date: 02/03/2021  Lower Venous DVT Study Patient Name:  YANIRA TOLSMA  Date of Exam:   02/03/2021 Medical Rec #: [id number]          Accession #:    [id number] Date of Birth: Oct 16, 1932         Patient Gender: F Patient Age:   70 years Exam Location:  [facility] Procedure:      VAS [place] LOWER EXTREMITY VENOUS (DVT) Referring Phys: ERIC CHEN --------------------------------------------------------------------------------  Indications: Pulmonary embolism.  Risk Factors: Confirmed PE. Anticoagulation: Heparin. Limitations: Poor ultrasound/tissue interface. Comparison Study: No prior studies. Performing Technologist: Oliver Hum RVT  Examination Guidelines: Zyla Dascenzo complete evaluation includes B-mode imaging, spectral Doppler, color Doppler, and power Doppler as needed of all accessible portions of each vessel. Bilateral testing is considered an integral part of Dacian Orrico complete examination. Limited examinations for reoccurring indications may be performed as noted. The reflux portion of the exam is performed with the patient in reverse Trendelenburg.  +---------+---------------+---------+-----------+----------+--------------+ RIGHT    CompressibilityPhasicitySpontaneityPropertiesThrombus Aging  +---------+---------------+---------+-----------+----------+--------------+ CFV      Full           Yes      Yes                                 +---------+---------------+---------+-----------+----------+--------------+ SFJ      Full                                                        +---------+---------------+---------+-----------+----------+--------------+ FV Prox  Full                                                        +---------+---------------+---------+-----------+----------+--------------+ FV Mid   Full                                                        +---------+---------------+---------+-----------+----------+--------------+ FV DistalFull                                                        +---------+---------------+---------+-----------+----------+--------------+  PFV      Full                                                        +---------+---------------+---------+-----------+----------+--------------+ POP      Full           Yes      Yes                                 +---------+---------------+---------+-----------+----------+--------------+ PTV      Full                                                        +---------+---------------+---------+-----------+----------+--------------+ PERO     Full                                                        +---------+---------------+---------+-----------+----------+--------------+   +---------+---------------+---------+-----------+----------+--------------+ LEFT     CompressibilityPhasicitySpontaneityPropertiesThrombus Aging +---------+---------------+---------+-----------+----------+--------------+ CFV      Full           Yes      Yes                                 +---------+---------------+---------+-----------+----------+--------------+ SFJ      Full                                                         +---------+---------------+---------+-----------+----------+--------------+ FV Prox  Full                                                        +---------+---------------+---------+-----------+----------+--------------+ FV Mid   Full                                                        +---------+---------------+---------+-----------+----------+--------------+ FV DistalFull                                                        +---------+---------------+---------+-----------+----------+--------------+ PFV      Full                                                        +---------+---------------+---------+-----------+----------+--------------+  POP      Full           Yes      Yes                                 +---------+---------------+---------+-----------+----------+--------------+ PTV      None                                         Acute          +---------+---------------+---------+-----------+----------+--------------+ PERO     Full                                                        +---------+---------------+---------+-----------+----------+--------------+     Summary: RIGHT: - There is no evidence of deep vein thrombosis in the lower extremity.  - No cystic structure found in the popliteal fossa.  LEFT: - Findings consistent with acute deep vein thrombosis involving the left posterior tibial veins. - No cystic structure found in the popliteal fossa.  *See table(s) above for measurements and observations. Electronically signed by Jamelle Haring on 02/03/2021 at 2:22:28 PM.    Final     Microbiology: Recent Results (from the past 240 hour(s))  Resp Panel by RT-PCR (Flu Brissia Delisa&B, Covid) Nasopharyngeal Swab     Status: None   Collection Time: 02/02/21  5:42 PM   Specimen: Nasopharyngeal Swab; Nasopharyngeal(NP) swabs in vial transport medium  Result Value Ref Range Status   SARS Coronavirus 2 by RT PCR NEGATIVE NEGATIVE Final    Comment:  (NOTE) SARS-CoV-2 target nucleic acids are NOT DETECTED.  The SARS-CoV-2 RNA is generally detectable in upper respiratory specimens during the acute phase of infection. The lowest concentration of SARS-CoV-2 viral copies this assay can detect is 138 copies/mL. Carlon Davidson negative result does not preclude SARS-Cov-2 infection and should not be used as the sole basis for treatment or other patient management decisions. Charlayne Vultaggio negative result may occur with  improper specimen collection/handling, submission of specimen other than nasopharyngeal swab, presence of viral mutation(s) within the areas targeted by this assay, and inadequate number of viral copies(<138 copies/mL). Nykia Turko negative result must be combined with clinical observations, patient history, and epidemiological information. The expected result is Negative.  Fact Sheet for Patients:  EntrepreneurPulse.com.au  Fact Sheet for Healthcare Providers:  IncredibleEmployment.be  This test is no t yet approved or cleared by the [place] FDA and  has been authorized for detection and/or diagnosis of SARS-CoV-2 by FDA under an Emergency Use Authorization (EUA). This EUA will remain  in effect (meaning this test can be used) for the duration of the COVID-19 declaration under Section 564(b)(1) of the Act, 21 U.S.C.section 360bbb-3(b)(1), unless the authorization is terminated  or revoked sooner.       Influenza Shanea Karney by PCR NEGATIVE NEGATIVE Final   Influenza B by PCR NEGATIVE NEGATIVE Final    Comment: (NOTE) The Xpert Xpress SARS-CoV-2/FLU/RSV plus assay is intended as an aid in the diagnosis of influenza from Nasopharyngeal swab specimens and should not be used as Itzae Miralles sole basis for treatment. Nasal washings and aspirates are unacceptable for Xpert Xpress SARS-CoV-2/FLU/RSV testing.  Fact Sheet for Patients:  EntrepreneurPulse.com.au  Fact Sheet for Healthcare  Providers: IncredibleEmployment.be  This test is not yet approved or cleared by the [place] FDA and has been authorized for detection and/or diagnosis of SARS-CoV-2 by FDA under an Emergency Use Authorization (EUA). This EUA will remain in effect (meaning this test can be used) for the duration of the COVID-19 declaration under Section 564(b)(1) of the Act, 21 U.S.C. section 360bbb-3(b)(1), unless the authorization is terminated or revoked.  Performed at [facility], [place] [address], [place], [place] [address]   Culture, blood (routine x 2)     Status: None   Collection Time: 02/02/21  7:20 PM   Specimen: BLOOD RIGHT ARM  Result Value Ref Range Status   Specimen Description BLOOD RIGHT ARM  Final   Special Requests   Final    BOTTLES DRAWN AEROBIC AND ANAEROBIC Blood Culture results may not be optimal due to an excessive volume of blood received in culture bottles   Culture   Final    NO GROWTH 5 DAYS Performed at [facility], [place] [address], [place], [place] [address]    Report Status 02/07/2021 FINAL  Final  Culture, blood (routine x 2)     Status: None   Collection Time: 02/02/21  7:23 PM   Specimen: BLOOD LEFT ARM  Result Value Ref Range Status   Specimen Description BLOOD LEFT ARM  Final   Special Requests   Final    BOTTLES DRAWN AEROBIC AND ANAEROBIC Blood Culture results may not be optimal due to an excessive volume of blood received in culture bottles   Culture   Final    NO GROWTH 5 DAYS Performed at [facility], [place] [address] [place], [place] [address]    Report Status 02/07/2021 FINAL  Final  MRSA Next Gen by PCR, Nasal     Status: None   Collection Time: 02/05/21 12:23 PM   Specimen: Nasal Mucosa; Nasal Swab  Result Value Ref Range Status   MRSA by PCR Next Gen NOT DETECTED NOT DETECTED Final    Comment: (NOTE) The GeneXpert MRSA Assay (FDA approved for NASAL specimens only), is one component of Delaine Canter comprehensive  MRSA colonization surveillance program. It is not intended to diagnose MRSA infection nor to guide or monitor treatment for MRSA infections. Test performance is not FDA approved in patients less than 28 years old. Performed at [facility], [place] [address] [place], [place] [address]      Labs: Basic Metabolic Panel: Recent Labs  Lab 02/03/21 0704 02/04/21 0253 02/05/21 0142 02/06/21 0233 02/07/21 0204  NA 137 136 139 140 140  K 3.8 3.7 4.1 3.5 3.6  CL 99 100 100 96* 94*  CO2 30 31 35* 36* 37*  GLUCOSE 241* 88 73 130* 83  BUN 15 10 12 13 15   CREATININE 1.08* 0.90 0.88 0.91 0.84  CALCIUM 8.6* 8.5* 8.5* 8.7* 8.4*  MG  --  2.1 2.2 2.1  --   PHOS  --  2.8 2.6 2.4*  --    Liver Function Tests: Recent Labs  Lab 02/02/21 1920 02/03/21 0704 02/04/21 0253 02/05/21 0142 02/06/21 0233  AST 53* 37 35 41 54*  ALT 32 29 25 25 27   ALKPHOS 826* 709* 675* 700* 718*  BILITOT 0.5 0.6 0.7 0.7 0.6  PROT 7.0 6.4* 6.6 6.3* 6.6  ALBUMIN 2.5* 2.1* 2.2* 2.1* 2.1*   No results for input(s): LIPASE, AMYLASE in the last 168 hours. No  results for input(s): AMMONIA in the last 168 hours. CBC: Recent Labs  Lab 02/02/21 1746 02/02/21 1806 02/03/21 0704 02/04/21 0253 02/05/21 0142 02/06/21 0233 02/07/21 0204  WBC 9.8  --  10.9* 11.2* 10.4 10.1 10.8*  NEUTROABS 6.6  --  7.3 7.6 7.6 7.3  --   HGB 10.0*   < > 10.3* 10.1* 9.3* 9.4* 8.9*  HCT 30.9*   < > 32.2* 32.4* 29.7* 30.4* 28.7*  MCV 91.4  --  92.0 93.6 92.5 94.1 94.7  PLT 216  --  275 253 240 235 215   < > = values in this interval not displayed.   Cardiac Enzymes: No results for input(s): CKTOTAL, CKMB, CKMBINDEX, TROPONINI in the last 168 hours. BNP: BNP (last 3 results) Recent Labs    02/02/21 1920  BNP 351.8*    ProBNP (last 3 results) No results for input(s): PROBNP in the last 8760 hours.  CBG: Recent Labs  Lab 02/07/21 0743 02/07/21 1221 02/07/21 1642 02/08/21 0008 02/08/21 0856  GLUCAP 93 153* 202* 97  115*       Signed:  Fayrene Helper MD.  [facility] 02/08/2021, 11:18 AM

## 2021-02-08 NOTE — Progress Notes (Signed)
PROGRESS NOTE    [name]  BZJ:[id number] DOB: 1932-05-13 DOA: 02/02/2021 PCP: Rogers Blocker, MD   Chief Complaint  Patient presents with   Shortness of Breath   Weakness   Brief Narrative:  87 yo with hx HTN, T2DM, HLD, hypothyroidism who was sent from PCP office with SOB and found to have PE/VTE and likely metastatic pancreatic cancer.  She's been seen by palliative care and oncology and current plan is for inpatient hospice.  Awaiting bed.  Assessment & Plan:   Principal Problem:   Acute pulmonary embolism (HCC) Active Problems:   Essential hypertension   Hyperlipidemia   Hypothyroidism   Type 2 diabetes mellitus without complication (HCC)   Acute respiratory failure with hypoxia (HCC)   Multiple pulmonary nodules - with concern for metastatic disease   S/P AVR (aortic valve replacement) - 06/25/2012   Pleural effusion on right   Pancreatic cancer metastasized to liver [facility])   Goals of care, counseling/discussion  Goals of care Planning for hospice after discussions with palliative and oncology.   Inpatient hospice referral placed.  Will place comfort measure order.  Unrestricted visitors.  Awaiting inpatient hospice bed  Acute Metabolic Encephalopathy  Delirium Delirium related to hospitalization, pneumonia, etc - she's on namenda (no hx of dementia in chart, ?cognitive deficit) Follow venous blood gas - mildly elevated Pco2 with normal pH Continue to treat PE, pneumonia Delirium precautions - continued delirium - improved W/u additionally as indicated  Left Upper Lobe Pulmonary Embolus  L Posterior Tibial DVT Portal Vein Thrombosis  Superior Mesenteric Vein Thrombosis 2/2 metastatic cancer Xarelto per oncology- mild hemoptysis previously, continue xarelto  Echo with normal RVSF, moderate to severe TV, PV regurg moderate to severe LE [place] with acute DVT of L posterior tibial veins  Metastatic Cancer Likely pancreatic Planning for inpatient hospice    Multifocal Pneumonia Continue ceftriaxone/azithromycin -> plan for 5 days (completed today) CXR 10/7 with dense bilateral patchy airspace disease Continue lasix, ? Component of edema/overload -> transition to oral lasix (she's developed alkalosis) Continue to monitor Small volume hemoptysis as noted above, follow  Acute hypoxic Respiratory Failure Multiple reasons being treated above O2 needs increased today Plan is for inpatient hospice  Severely elevated PASP  Moderate to Severe TV Regurgitation  Mild to Moderate MV Regurgitation  Moderate to Severe PV Regurgitation  Pyuria Follow culture  HTN coreg  T2DM SSI, basal 10 units Continue to monitor  HLD crestor  Hypothyroidism Synthroid  Namenda  DVT prophylaxis: heparin -> lovenox -> xarelto Code Status: DNR  Family Communication: none at bedside today Disposition:   Status is: Inpatient  Remains inpatient appropriate because:Inpatient level of care appropriate due to severity of illness  Dispo: The patient is from: Home              Anticipated d/c is to: Home              Patient currently is not medically stable to d/c.   Difficult to place patient No       Consultants:  none  Procedures:  Echo IMPRESSIONS     1. Left ventricular ejection fraction, by estimation, is 60 to 65%. The  left ventricle has normal function. The left ventricle has no regional  wall motion abnormalities. Left ventricular diastolic parameters are  indeterminate.   2. Right ventricular systolic function is normal. The right ventricular  size is mildly enlarged. There is severely elevated pulmonary artery  systolic pressure. The estimated right ventricular  systolic pressure is  92.1 mmHg.   3. The mitral valve is grossly normal. Mild to moderate mitral valve  regurgitation.   4. Tricuspid valve regurgitation is moderate to severe.   5. The aortic valve is grossly normal. Aortic valve regurgitation is not  visualized.  There is [name] 21 mm Magna Ease valve present in the aortic  position. Procedure Date: 06/25/2012.   6. Pulmonic valve regurgitation is moderate to severe.      Summary:  RIGHT:  - There is no evidence of deep vein thrombosis in the lower extremity.     - No cystic structure found in the popliteal fossa.     LEFT:  - Findings consistent with acute deep vein thrombosis involving the left  posterior tibial veins.  - No cystic structure found in the popliteal fossa.      Antimicrobials:  Anti-infectives (From admission, onward)    Start     Dose/Rate Route Frequency Ordered Stop   02/03/21 1000  azithromycin (ZITHROMAX) tablet 500 mg  Status:  Discontinued        500 mg Oral Daily 02/02/21 2250 02/07/21 1238   02/03/21 0800  cefTRIAXone (ROCEPHIN) 2 g in sodium chloride 0.9 % 100 mL IVPB  Status:  Discontinued        2 g 200 mL/hr over 30 Minutes Intravenous Every 24 hours 02/02/21 2250 02/07/21 1238   02/02/21 2145  vancomycin (VANCOREADY) IVPB 1500 mg/300 mL        1,500 mg 150 mL/hr over 120 Minutes Intravenous  Once 02/02/21 2138 02/03/21 0100   02/02/21 2130  ceFEPIme (MAXIPIME) 2 g in sodium chloride 0.9 % 100 mL IVPB        2 g 200 mL/hr over 30 Minutes Intravenous  Once 02/02/21 2129 02/02/21 2211      Subjective: Feels [name] little down this morning, thinks it's the weather (appreciated blinds being opened)  Objective: Vitals:   02/06/21 0900 02/06/21 1948 02/07/21 0358 02/07/21 0900  BP: (!) 130/52 (!) 138/58 (!) 139/57 113/86  Pulse: 71 63 65 62  Resp: 16 18 20 18   Temp: 99 F (37.2 C) 98.2 F (36.8 C) 98.4 F (36.9 C) 98.9 F (37.2 C)  TempSrc: Oral  Oral Oral  SpO2: 99% 99% 100% 98%  Weight:      Height:        Intake/Output Summary (Last 24 hours) at 02/08/2021 1941 Last data filed at 02/07/2021 1400 Gross per 24 hour  Intake 400 ml  Output --  Net 400 ml   Filed Weights   02/03/21 1952 02/04/21 0500 02/05/21 0500  Weight: 69.7 kg 69.8 kg 68.6 kg     Examination:  Limited exam with focus on comfort NAD, appears more tired this morning No increased work of breathing Alert, seems to understand situation and plan for [facility] place Skin is warm and dry  Data Reviewed: I have personally reviewed following labs and imaging studies  CBC: Recent Labs  Lab 02/02/21 1746 02/02/21 1806 02/03/21 0704 02/04/21 0253 02/05/21 0142 02/06/21 0233 02/07/21 0204  WBC 9.8  --  10.9* 11.2* 10.4 10.1 10.8*  NEUTROABS 6.6  --  7.3 7.6 7.6 7.3  --   HGB 10.0*   < > 10.3* 10.1* 9.3* 9.4* 8.9*  HCT 30.9*   < > 32.2* 32.4* 29.7* 30.4* 28.7*  MCV 91.4  --  92.0 93.6 92.5 94.1 94.7  PLT 216  --  275 253 240 235 215   < > =  values in this interval not displayed.    Basic Metabolic Panel: Recent Labs  Lab 02/03/21 0704 02/04/21 0253 02/05/21 0142 02/06/21 0233 02/07/21 0204  NA 137 136 139 140 140  K 3.8 3.7 4.1 3.5 3.6  CL 99 100 100 96* 94*  CO2 30 31 35* 36* 37*  GLUCOSE 241* 88 73 130* 83  BUN 15 10 12 13 15   CREATININE 1.08* 0.90 0.88 0.91 0.84  CALCIUM 8.6* 8.5* 8.5* 8.7* 8.4*  MG  --  2.1 2.2 2.1  --   PHOS  --  2.8 2.6 2.4*  --     GFR: Estimated Creatinine Clearance: 46.1 mL/min (by C-G formula based on SCr of 0.84 mg/dL).  Liver Function Tests: Recent Labs  Lab 02/02/21 1920 02/03/21 0704 02/04/21 0253 02/05/21 0142 02/06/21 0233  AST 53* 37 35 41 54*  ALT 32 29 25 25 27   ALKPHOS 826* 709* 675* 700* 718*  BILITOT 0.5 0.6 0.7 0.7 0.6  PROT 7.0 6.4* 6.6 6.3* 6.6  ALBUMIN 2.5* 2.1* 2.2* 2.1* 2.1*    CBG: Recent Labs  Lab 02/06/21 1949 02/07/21 0743 02/07/21 1221 02/07/21 1642 02/08/21 0008  GLUCAP 178* 93 153* 202* 97     Recent Results (from the past 240 hour(s))  Resp Panel by RT-PCR (Flu Daaiel Starlin&B, Covid) Nasopharyngeal Swab     Status: None   Collection Time: 02/02/21  5:42 PM   Specimen: Nasopharyngeal Swab; Nasopharyngeal(NP) swabs in vial transport medium  Result Value Ref Range Status   SARS  Coronavirus 2 by RT PCR NEGATIVE NEGATIVE Final    Comment: (NOTE) SARS-CoV-2 target nucleic acids are NOT DETECTED.  The SARS-CoV-2 RNA is generally detectable in upper respiratory specimens during the acute phase of infection. The lowest concentration of SARS-CoV-2 viral copies this assay can detect is 138 copies/mL. Delight Bickle negative result does not preclude SARS-Cov-2 infection and should not be used as the sole basis for treatment or other patient management decisions. Telma Pyeatt negative result may occur with  improper specimen collection/handling, submission of specimen other than nasopharyngeal swab, presence of viral mutation(s) within the areas targeted by this assay, and inadequate number of viral copies(<138 copies/mL). Yanky Vanderburg negative result must be combined with clinical observations, patient history, and epidemiological information. The expected result is Negative.  Fact Sheet for Patients:  EntrepreneurPulse.com.au  Fact Sheet for Healthcare Providers:  IncredibleEmployment.be  This test is no t yet approved or cleared by the [place] FDA and  has been authorized for detection and/or diagnosis of SARS-CoV-2 by FDA under an Emergency Use Authorization (EUA). This EUA will remain  in effect (meaning this test can be used) for the duration of the COVID-19 declaration under Section 564(b)(1) of the Act, 21 U.S.C.section 360bbb-3(b)(1), unless the authorization is terminated  or revoked sooner.       Influenza Branden Shallenberger by PCR NEGATIVE NEGATIVE Final   Influenza B by PCR NEGATIVE NEGATIVE Final    Comment: (NOTE) The Xpert Xpress SARS-CoV-2/FLU/RSV plus assay is intended as an aid in the diagnosis of influenza from Nasopharyngeal swab specimens and should not be used as Jaycub Noorani sole basis for treatment. Nasal washings and aspirates are unacceptable for Xpert Xpress SARS-CoV-2/FLU/RSV testing.  Fact Sheet for  Patients: EntrepreneurPulse.com.au  Fact Sheet for Healthcare Providers: IncredibleEmployment.be  This test is not yet approved or cleared by the [place] FDA and has been authorized for detection and/or diagnosis of SARS-CoV-2 by FDA under an Emergency Use Authorization (EUA). This EUA will remain in effect (  meaning this test can be used) for the duration of the COVID-19 declaration under Section 564(b)(1) of the Act, 21 U.S.C. section 360bbb-3(b)(1), unless the authorization is terminated or revoked.  Performed at [facility], [place] [address], [place], [place] [address]   Culture, blood (routine x 2)     Status: None   Collection Time: 02/02/21  7:20 PM   Specimen: BLOOD RIGHT ARM  Result Value Ref Range Status   Specimen Description BLOOD RIGHT ARM  Final   Special Requests   Final    BOTTLES DRAWN AEROBIC AND ANAEROBIC Blood Culture results may not be optimal due to an excessive volume of blood received in culture bottles   Culture   Final    NO GROWTH 5 DAYS Performed at [facility], [place] [address] [place], [place] [address]    Report Status 02/07/2021 FINAL  Final  Culture, blood (routine x 2)     Status: None   Collection Time: 02/02/21  7:23 PM   Specimen: BLOOD LEFT ARM  Result Value Ref Range Status   Specimen Description BLOOD LEFT ARM  Final   Special Requests   Final    BOTTLES DRAWN AEROBIC AND ANAEROBIC Blood Culture results may not be optimal due to an excessive volume of blood received in culture bottles   Culture   Final    NO GROWTH 5 DAYS Performed at [facility], [place] [address], [place], [place] [address]    Report Status 02/07/2021 FINAL  Final  MRSA Next Gen by PCR, Nasal     Status: None   Collection Time: 02/05/21 12:23 PM   Specimen: Nasal Mucosa; Nasal Swab  Result Value Ref Range Status   MRSA by PCR Next Gen NOT DETECTED NOT DETECTED Final    Comment: (NOTE) The GeneXpert MRSA  Assay (FDA approved for NASAL specimens only), is one component of Ryheem Jay comprehensive MRSA colonization surveillance program. It is not intended to diagnose MRSA infection nor to guide or monitor treatment for MRSA infections. Test performance is not FDA approved in patients less than 48 years old. Performed at [facility], [place] [address], [place],  [address]          Radiology Studies: No results found.      Scheduled Meds:  amLODipine  5 mg Oral Daily   carvedilol  12.5 mg Oral BID   feeding supplement  237 mL Oral BID BM   furosemide  20 mg Oral Daily   insulin aspart  0-15 Units Subcutaneous TID WC   insulin aspart  0-5 Units Subcutaneous QHS   insulin glargine-yfgn  10 Units Subcutaneous QHS   levothyroxine  75 mcg Oral Daily   memantine  5 mg Oral QHS   multivitamin with minerals  1 tablet Oral Daily   Rivaroxaban  15 mg Oral BID WC   Followed by   Derrill Memo ON 02/26/2021] rivaroxaban  20 mg Oral Q supper   rosuvastatin  20 mg Oral Daily   Continuous Infusions:     LOS: 6 days    Time spent: over 30 min    Fayrene Helper, MD [facility]   To contact the attending provider between 7A-7P or the covering provider during after hours 7P-7A, please log into the web site [email or web address] and access using universal [place] password for that web site. If you do not have the password, please call the hospital operator.  02/08/2021, 8:33 AM

## 2021-02-08 NOTE — Progress Notes (Signed)
[facility] for Heparin >> Xarelto Indication: pulmonary embolus  No Known Allergies  Patient Measurements: Height: 5' 3.5" (161.3 cm) Weight: 68.6 kg (151 lb 3.8 oz) IBW/kg (Calculated) : 53.55  Heparin Dosing Weight: 70.9 kg  Vital Signs:    Labs: Recent Labs    02/06/21 0233 02/07/21 0204  HGB 9.4* 8.9*  HCT 30.4* 28.7*  PLT 235 215  CREATININE 0.91 0.84     Estimated Creatinine Clearance: 46.1 mL/min (by C-G formula based on SCr of 0.84 mg/dL).   Medications:  Scheduled:   amLODipine  5 mg Oral Daily   carvedilol  12.5 mg Oral BID   feeding supplement  237 mL Oral BID BM   furosemide  20 mg Oral Daily   insulin aspart  0-15 Units Subcutaneous TID WC   insulin aspart  0-5 Units Subcutaneous QHS   insulin glargine-yfgn  10 Units Subcutaneous QHS   levothyroxine  75 mcg Oral Daily   memantine  5 mg Oral QHS   multivitamin with minerals  1 tablet Oral Daily   Rivaroxaban  15 mg Oral BID WC   Followed by   Derrill Memo ON 02/26/2021] rivaroxaban  20 mg Oral Q supper   rosuvastatin  20 mg Oral Daily   Infusions:     Assessment: 86 YO female presented with shortness of breath and fatigue. CT of chest demonstrated left upper lobe PE. CXR shows multiple nodules concerning for metastatic disease and shows moderate right-sided pleural effusion. Pharmacy consulted to manage heparin infusion.    Started Xeralto 02/05/2021 15 mg po bid X 21 days followed by 20 mg po qday (starting 10/29)     Plan:  Continue to monitor H&H and platelets   Thank you for allowing pharmacy to be a part of this patient's care.  Latyra Jaye BS, PharmD, BCPS, RPh Clinical Pharmacist

## 2021-02-08 NOTE — Progress Notes (Signed)
Report called to Butch Penny, Therapist, sports at [facility]

## 2021-02-08 NOTE — Progress Notes (Signed)
[facility] 2E10 [facility]) Hospice hospital liaison note   Ms. Therrien is eligible for [employer] and there is a bed available for today which the patient's sister has accepted.   We will update once the consent process is completed.    RN please call report to [phone]   Please send completed DNR with patient.   Updated attending and [facility] manager via [place].  Thanks for the opportunity to participate in this patient's care  Jhonnie Garner, BSN, RN, [facility] hospital liaison [phone]

## 2021-02-08 NOTE — Progress Notes (Signed)
Overall, she is about the same.  She does have the memory issues.  She did remember me, I think,  Hopefully, she can get to [facility] soon.  Her tumor marker, CA 19-9, still not back yet.  The prealbumin is less than 5.  This is incredibly prognostic.  Again, I suspect that she will not survive the end of this month.  She is on Xarelto.  She is having no chest wall pain.  There is no shortness of breath.  She does have a clot in her left leg.  Overall, she seems pretty comfortable.  Quality of life is what this is all about now.  Hopefully, she will be able to get to [facility] in a day or 2.  No other there often full but there does tend to be a pretty quick turnaround.  Lattie Haw, MD  Romans 12:10

## 2021-02-08 NOTE — Progress Notes (Signed)
Physical Therapy Treatment Patient Details Name: [name] MRN: [id number] DOB: [birth year] Today's Date: 02/08/2021   History of Present Illness 87 year old female admitted 10/5 to the ER from the PCP office with hypoxia. Pt found to have acute PE as well as questionable metastatic lung CA.  Pt has been confused as well. Palliative care following pt. PMH:  HTN, type 2 diabetes on insulin, HLD, acquired hypothyroidism    PT Comments    Pt pleasant and initially stating fatigue but able to progress gait distance and HEp this session with maintained sats on 4L. Pt reports wanting to return home but aware sister cannot physically assist. Pt with good bil LE strength and encouraged to continue mobility to maintain function acutely.      Recommendations for follow up therapy are one component of a multi-disciplinary discharge planning process, led by the attending physician.  Recommendations may be updated based on patient status, additional functional criteria and insurance authorization.  Follow Up Recommendations  Home health PT;Supervision for mobility/OOB     Equipment Recommendations  None recommended by PT    Recommendations for Other Services       Precautions / Restrictions Precautions Precautions: Fall;Other (comment) Precaution Comments: watch sats     Mobility  Bed Mobility Overal bed mobility: Modified Independent             General bed mobility comments: increased time with use of rail to pivot to left side of bed    Transfers Overall transfer level: Needs assistance   Transfers: Sit to/from Stand Sit to Stand: Min guard         General transfer comment: guarding with cues for hand placement to rise from bed and to recliner  Ambulation/Gait Ambulation/Gait assistance: Min guard Gait Distance (Feet): 100 Feet Assistive device: Rolling walker (2 wheeled) Gait Pattern/deviations: Step-through pattern;Decreased stride length   Gait velocity  interpretation: 1.31 - 2.62 ft/sec, indicative of limited community ambulator General Gait Details: cues for hand placement, direction and position in RW. Pt able to walk on 4L with sats 91-94% with pt self directing activity tolerance   Stairs             Wheelchair Mobility    Modified Rankin (Stroke Patients Only)       Balance Overall balance assessment: Needs assistance   Sitting balance-Leahy Scale: Fair Sitting balance - Comments: EOB without assist   Standing balance support: Bilateral upper extremity supported;During functional activity Standing balance-Leahy Scale: Fair Standing balance comment: RW for gait                            Cognition Arousal/Alertness: Awake/alert Behavior During Therapy: WFL for tasks assessed/performed Overall Cognitive Status: Impaired/Different from baseline Area of Impairment: Orientation;Safety/judgement;Problem solving;Memory                 Orientation Level: Disoriented to;Time   Memory: Decreased short-term memory   Safety/Judgement: Decreased awareness of deficits   Problem Solving: Requires verbal cues        Exercises General Exercises - Lower Extremity Long Arc Quad: AROM;20 reps;Both;Seated Hip ABduction/ADduction: AROM;Both;Seated;20 reps Hip Flexion/Marching: AROM;Both;Seated;20 reps    General Comments        Pertinent Vitals/Pain Pain Assessment: No/denies pain    Home Living                      Prior Function  PT Goals (current goals can now be found in the care plan section) Progress towards PT goals: Progressing toward goals    Frequency    Min 3X/week      PT Plan Current plan remains appropriate    Co-evaluation              AM-PAC PT "6 Clicks" Mobility   Outcome Measure  Help needed turning from your back to your side while in a flat bed without using bedrails?: None Help needed moving from lying on your back to sitting on the  side of a flat bed without using bedrails?: None Help needed moving to and from a bed to a chair (including a wheelchair)?: A Little Help needed standing up from a chair using your arms (e.g., wheelchair or bedside chair)?: A Little Help needed to walk in hospital room?: A Little Help needed climbing 3-5 steps with a railing? : A Little 6 Click Score: 20    End of Session Equipment Utilized During Treatment: Gait belt;Oxygen Activity Tolerance: Patient tolerated treatment well Patient left: in chair;with call bell/phone within reach;with chair alarm set Nurse Communication: Mobility status PT Visit Diagnosis: Other abnormalities of gait and mobility (R26.89)     Time: 7209-4709 PT Time Calculation (min) (ACUTE ONLY): 21 min  Charges:  $Gait Training: 8-22 mins                     [place] Pager: [phone] Office: [address] 02/08/2021, 11:42 AM

## 2021-02-10 LAB — CANCER ANTIGEN 19-9: CA 19-9: 154152 U/mL — ABNORMAL HIGH (ref 0–35)

## 2021-02-11 ENCOUNTER — Other Ambulatory Visit: Payer: Medicare Other

## 2021-02-16 ENCOUNTER — Ambulatory Visit: Payer: Medicare Other | Admitting: Endocrinology

## 2021-02-17 ENCOUNTER — Other Ambulatory Visit (HOSPITAL_COMMUNITY): Payer: Self-pay

## 2021-03-01 DEATH — deceased

## 2021-04-08 ENCOUNTER — Ambulatory Visit: Payer: Medicare Other | Admitting: Podiatry

## 2021-05-18 ENCOUNTER — Ambulatory Visit: Payer: Medicare Other | Admitting: Physician Assistant
